# Patient Record
Sex: Female | Born: 1940 | Race: White | Hispanic: No | Smoking: Former smoker
Health system: Southern US, Community
[De-identification: ages and names within clinical notes are randomized; demographics above are authoritative.]

## PROBLEM LIST (undated history)

## (undated) DIAGNOSIS — E7800 Pure hypercholesterolemia, unspecified: Secondary | ICD-10-CM

## (undated) DIAGNOSIS — M810 Age-related osteoporosis without current pathological fracture: Secondary | ICD-10-CM

## (undated) DIAGNOSIS — C801 Malignant (primary) neoplasm, unspecified: Secondary | ICD-10-CM

## (undated) DIAGNOSIS — I10 Essential (primary) hypertension: Secondary | ICD-10-CM

## (undated) DIAGNOSIS — N3281 Overactive bladder: Secondary | ICD-10-CM

## (undated) DIAGNOSIS — E785 Hyperlipidemia, unspecified: Secondary | ICD-10-CM

## (undated) DIAGNOSIS — R9431 Abnormal electrocardiogram [ECG] [EKG]: Secondary | ICD-10-CM

## (undated) DIAGNOSIS — H269 Unspecified cataract: Secondary | ICD-10-CM

## (undated) HISTORY — PX: PORTACATH PLACEMENT: SHX2246

## (undated) HISTORY — DX: Hyperlipidemia, unspecified: E78.5

## (undated) HISTORY — PX: BREAST SURGERY: SHX581

## (undated) HISTORY — DX: Malignant (primary) neoplasm, unspecified: C80.1

## (undated) HISTORY — DX: Overactive bladder: N32.81

## (undated) HISTORY — PX: FRACTURE SURGERY: SHX138

## (undated) HISTORY — DX: Age-related osteoporosis without current pathological fracture: M81.0

## (undated) HISTORY — PX: JOINT REPLACEMENT: SHX530

## (undated) HISTORY — DX: Unspecified cataract: H26.9

## (undated) HISTORY — DX: Pure hypercholesterolemia, unspecified: E78.00

## (undated) HISTORY — DX: Abnormal electrocardiogram (ECG) (EKG): R94.31

## (undated) HISTORY — DX: Essential (primary) hypertension: I10

---

## 2000-10-06 ENCOUNTER — Encounter: Admission: RE | Admit: 2000-10-06 | Discharge: 2001-01-04 | Payer: Self-pay | Admitting: Family Medicine

## 2001-01-27 ENCOUNTER — Ambulatory Visit (HOSPITAL_COMMUNITY): Admission: RE | Admit: 2001-01-27 | Discharge: 2001-01-27 | Payer: Self-pay

## 2005-01-29 ENCOUNTER — Ambulatory Visit: Payer: Self-pay | Admitting: Internal Medicine

## 2005-02-24 ENCOUNTER — Ambulatory Visit: Payer: Self-pay | Admitting: Family Medicine

## 2005-03-10 ENCOUNTER — Ambulatory Visit: Payer: Self-pay | Admitting: Family Medicine

## 2005-06-24 ENCOUNTER — Emergency Department (HOSPITAL_COMMUNITY): Admission: EM | Admit: 2005-06-24 | Discharge: 2005-06-24 | Payer: Self-pay | Admitting: Emergency Medicine

## 2005-07-16 ENCOUNTER — Encounter: Admission: RE | Admit: 2005-07-16 | Discharge: 2005-10-14 | Payer: Self-pay | Admitting: Sports Medicine

## 2005-08-19 ENCOUNTER — Ambulatory Visit: Payer: Self-pay | Admitting: Family Medicine

## 2005-09-03 ENCOUNTER — Ambulatory Visit: Payer: Self-pay | Admitting: Family Medicine

## 2006-04-14 ENCOUNTER — Ambulatory Visit: Payer: Self-pay | Admitting: Family Medicine

## 2006-08-25 ENCOUNTER — Ambulatory Visit: Payer: Self-pay | Admitting: Family Medicine

## 2006-11-02 HISTORY — PX: CATARACT EXTRACTION: SUR2

## 2007-03-08 ENCOUNTER — Ambulatory Visit: Payer: Self-pay | Admitting: Family Medicine

## 2007-07-07 ENCOUNTER — Telehealth (INDEPENDENT_AMBULATORY_CARE_PROVIDER_SITE_OTHER): Payer: Self-pay | Admitting: Family Medicine

## 2007-08-16 ENCOUNTER — Telehealth (INDEPENDENT_AMBULATORY_CARE_PROVIDER_SITE_OTHER): Payer: Self-pay | Admitting: *Deleted

## 2007-08-24 ENCOUNTER — Telehealth (INDEPENDENT_AMBULATORY_CARE_PROVIDER_SITE_OTHER): Payer: Self-pay | Admitting: Family Medicine

## 2007-08-29 ENCOUNTER — Ambulatory Visit: Payer: Self-pay | Admitting: Nurse Practitioner

## 2007-08-29 LAB — CONVERTED CEMR LAB
ALT: 31 units/L (ref 0–35)
AST: 18 units/L (ref 0–37)
Basophils Absolute: 0 10*3/uL (ref 0.0–0.1)
Basophils Relative: 0 % (ref 0–1)
CO2: 22 meq/L (ref 19–32)
Cholesterol: 172 mg/dL (ref 0–200)
Creatinine, Ser: 0.95 mg/dL (ref 0.40–1.20)
Eosinophils Relative: 0 % (ref 0–5)
HCT: 43.8 % (ref 36.0–46.0)
Hemoglobin: 14.6 g/dL (ref 12.0–15.0)
MCHC: 33.3 g/dL (ref 30.0–36.0)
Monocytes Absolute: 0.6 10*3/uL (ref 0.2–0.7)
RDW: 13.5 % (ref 11.5–14.0)
Total Bilirubin: 0.5 mg/dL (ref 0.3–1.2)
Total CHOL/HDL Ratio: 4.8
VLDL: 45 mg/dL — ABNORMAL HIGH (ref 0–40)

## 2007-09-12 ENCOUNTER — Ambulatory Visit: Payer: Self-pay | Admitting: Internal Medicine

## 2007-10-11 ENCOUNTER — Ambulatory Visit: Payer: Self-pay | Admitting: Family Medicine

## 2007-10-18 ENCOUNTER — Ambulatory Visit: Payer: Self-pay | Admitting: Internal Medicine

## 2007-11-07 ENCOUNTER — Ambulatory Visit: Payer: Self-pay | Admitting: Internal Medicine

## 2007-11-24 ENCOUNTER — Ambulatory Visit: Payer: Self-pay | Admitting: Internal Medicine

## 2007-12-15 ENCOUNTER — Ambulatory Visit: Payer: Self-pay | Admitting: Family Medicine

## 2008-06-25 ENCOUNTER — Encounter (INDEPENDENT_AMBULATORY_CARE_PROVIDER_SITE_OTHER): Payer: Self-pay | Admitting: Family Medicine

## 2008-06-25 ENCOUNTER — Ambulatory Visit: Payer: Self-pay | Admitting: Internal Medicine

## 2008-06-25 LAB — CONVERTED CEMR LAB: Microalb, Ur: 2.61 mg/dL — ABNORMAL HIGH (ref 0.00–1.89)

## 2008-07-05 ENCOUNTER — Ambulatory Visit: Payer: Self-pay | Admitting: Internal Medicine

## 2009-04-02 ENCOUNTER — Ambulatory Visit: Payer: Self-pay | Admitting: Internal Medicine

## 2009-04-02 ENCOUNTER — Encounter (INDEPENDENT_AMBULATORY_CARE_PROVIDER_SITE_OTHER): Payer: Self-pay | Admitting: Internal Medicine

## 2009-04-02 LAB — CONVERTED CEMR LAB
ALT: 26 units/L (ref 0–35)
BUN: 26 mg/dL — ABNORMAL HIGH (ref 6–23)
CO2: 24 meq/L (ref 19–32)
Creatinine, Ser: 1.14 mg/dL (ref 0.40–1.20)
TSH: 2.123 microintl units/mL (ref 0.350–4.500)
Total Bilirubin: 0.3 mg/dL (ref 0.3–1.2)

## 2009-05-23 ENCOUNTER — Encounter (INDEPENDENT_AMBULATORY_CARE_PROVIDER_SITE_OTHER): Payer: Self-pay | Admitting: Internal Medicine

## 2009-05-23 ENCOUNTER — Ambulatory Visit: Payer: Self-pay | Admitting: Internal Medicine

## 2009-05-23 LAB — CONVERTED CEMR LAB
Cholesterol: 134 mg/dL (ref 0–200)
HDL: 43 mg/dL (ref >39)
Total CHOL/HDL Ratio: 3.1
Triglycerides: 166 mg/dL — ABNORMAL HIGH (ref <150)

## 2009-06-11 ENCOUNTER — Inpatient Hospital Stay (HOSPITAL_COMMUNITY): Admission: EM | Admit: 2009-06-11 | Discharge: 2009-06-21 | Payer: Self-pay

## 2009-06-18 ENCOUNTER — Ambulatory Visit: Payer: Self-pay | Admitting: Physical Medicine & Rehabilitation

## 2009-10-24 ENCOUNTER — Ambulatory Visit: Payer: Self-pay | Admitting: Family Medicine

## 2010-02-04 ENCOUNTER — Ambulatory Visit: Payer: Self-pay | Admitting: Family Medicine

## 2010-02-20 ENCOUNTER — Encounter (INDEPENDENT_AMBULATORY_CARE_PROVIDER_SITE_OTHER): Payer: Self-pay | Admitting: Adult Health

## 2010-02-20 ENCOUNTER — Ambulatory Visit: Payer: Self-pay | Admitting: Internal Medicine

## 2010-02-20 LAB — CONVERTED CEMR LAB
BUN: 21 mg/dL (ref 6–23)
CO2: 23 meq/L (ref 19–32)
Cholesterol: 129 mg/dL (ref 0–200)
Creatinine, Ser: 1.09 mg/dL (ref 0.40–1.20)
Glucose, Bld: 226 mg/dL — ABNORMAL HIGH (ref 70–99)
HDL: 41 mg/dL (ref >39)
Total Bilirubin: 0.4 mg/dL (ref 0.3–1.2)
Total CHOL/HDL Ratio: 3.1
Triglycerides: 145 mg/dL (ref <150)
VLDL: 29 mg/dL (ref 0–40)
Vit D, 25-Hydroxy: <10 ng/mL — ABNORMAL LOW (ref 30–89)

## 2010-03-28 ENCOUNTER — Ambulatory Visit (HOSPITAL_COMMUNITY): Admission: RE | Admit: 2010-03-28 | Discharge: 2010-03-28 | Payer: Self-pay | Admitting: Internal Medicine

## 2010-04-04 ENCOUNTER — Encounter: Admission: RE | Admit: 2010-04-04 | Discharge: 2010-04-04 | Payer: Self-pay | Admitting: Family Medicine

## 2010-04-11 ENCOUNTER — Encounter: Admission: RE | Admit: 2010-04-11 | Discharge: 2010-04-11 | Payer: Self-pay | Admitting: Internal Medicine

## 2010-04-14 ENCOUNTER — Ambulatory Visit: Payer: Self-pay | Admitting: Oncology

## 2010-04-22 ENCOUNTER — Ambulatory Visit: Admission: RE | Admit: 2010-04-22 | Discharge: 2010-07-14 | Payer: Self-pay | Admitting: Radiation Oncology

## 2010-04-22 ENCOUNTER — Ambulatory Visit (HOSPITAL_COMMUNITY): Admission: RE | Admit: 2010-04-22 | Discharge: 2010-04-22 | Payer: Self-pay | Admitting: General Surgery

## 2010-04-29 LAB — CBC WITH DIFFERENTIAL/PLATELET
BASO%: 0 % (ref 0.0–2.0)
EOS%: 0.7 % (ref 0.0–7.0)
MCH: 30.9 pg (ref 25.1–34.0)
MCHC: 32.2 g/dL (ref 31.5–36.0)
RBC: 3.85 10*6/uL (ref 3.70–5.45)
RDW: 13.7 % (ref 11.2–14.5)
WBC: 6.8 10*3/uL (ref 3.9–10.3)
lymph#: 1.9 10*3/uL (ref 0.9–3.3)

## 2010-04-29 LAB — COMPREHENSIVE METABOLIC PANEL
ALT: 15 U/L (ref 0–35)
AST: 12 U/L (ref 0–37)
Albumin: 4.1 g/dL (ref 3.5–5.2)
Calcium: 9.7 mg/dL (ref 8.4–10.5)
Chloride: 101 mEq/L (ref 96–112)
Creatinine, Ser: 1.3 mg/dL — ABNORMAL HIGH (ref 0.40–1.20)
Potassium: 4.9 mEq/L (ref 3.5–5.3)
Sodium: 138 mEq/L (ref 135–145)
Total Protein: 6.8 g/dL (ref 6.0–8.3)

## 2010-05-07 ENCOUNTER — Ambulatory Visit: Payer: Self-pay | Admitting: Adult Health

## 2010-05-15 ENCOUNTER — Encounter (INDEPENDENT_AMBULATORY_CARE_PROVIDER_SITE_OTHER): Payer: Self-pay | Admitting: General Surgery

## 2010-05-15 ENCOUNTER — Encounter: Admission: RE | Admit: 2010-05-15 | Discharge: 2010-05-15 | Payer: Self-pay | Admitting: General Surgery

## 2010-05-15 ENCOUNTER — Ambulatory Visit (HOSPITAL_COMMUNITY): Admission: RE | Admit: 2010-05-15 | Discharge: 2010-05-16 | Payer: Self-pay | Admitting: General Surgery

## 2010-05-19 ENCOUNTER — Ambulatory Visit (HOSPITAL_COMMUNITY): Admission: RE | Admit: 2010-05-19 | Discharge: 2010-05-19 | Payer: Self-pay | Admitting: Oncology

## 2010-06-09 ENCOUNTER — Ambulatory Visit: Payer: Self-pay | Admitting: Oncology

## 2010-06-11 LAB — CBC WITH DIFFERENTIAL/PLATELET
BASO%: 0.3 % (ref 0.0–2.0)
Basophils Absolute: 0 10*3/uL (ref 0.0–0.1)
EOS%: 0.9 % (ref 0.0–7.0)
HCT: 33.8 % — ABNORMAL LOW (ref 34.8–46.6)
HGB: 11.6 g/dL (ref 11.6–15.9)
MCH: 31.8 pg (ref 25.1–34.0)
MONO#: 0.6 10*3/uL (ref 0.1–0.9)
RDW: 12.9 % (ref 11.2–14.5)
WBC: 8.8 10*3/uL (ref 3.9–10.3)
lymph#: 2 10*3/uL (ref 0.9–3.3)

## 2010-06-11 LAB — COMPREHENSIVE METABOLIC PANEL
ALT: 14 U/L (ref 0–35)
AST: 11 U/L (ref 0–37)
Albumin: 4.1 g/dL (ref 3.5–5.2)
CO2: 25 mEq/L (ref 19–32)
Calcium: 9.8 mg/dL (ref 8.4–10.5)
Chloride: 101 mEq/L (ref 96–112)
Potassium: 4.3 mEq/L (ref 3.5–5.3)

## 2010-06-27 ENCOUNTER — Ambulatory Visit (HOSPITAL_COMMUNITY): Admission: RE | Admit: 2010-06-27 | Discharge: 2010-06-27 | Payer: Self-pay | Admitting: Oncology

## 2010-07-02 ENCOUNTER — Ambulatory Visit (HOSPITAL_COMMUNITY)
Admission: RE | Admit: 2010-07-02 | Discharge: 2010-07-03 | Payer: Self-pay | Source: Home / Self Care | Admitting: General Surgery

## 2010-07-02 ENCOUNTER — Encounter (INDEPENDENT_AMBULATORY_CARE_PROVIDER_SITE_OTHER): Payer: Self-pay | Admitting: General Surgery

## 2010-07-23 ENCOUNTER — Ambulatory Visit: Payer: Self-pay | Admitting: Oncology

## 2010-08-06 LAB — BASIC METABOLIC PANEL
Calcium: 9.7 mg/dL (ref 8.4–10.5)
Creatinine, Ser: 0.99 mg/dL (ref 0.40–1.20)
Glucose, Bld: 445 mg/dL — ABNORMAL HIGH (ref 70–99)
Sodium: 135 mEq/L (ref 135–145)

## 2010-08-06 LAB — CBC WITH DIFFERENTIAL/PLATELET
BASO%: 0 % (ref 0.0–2.0)
Eosinophils Absolute: 0.1 10*3/uL (ref 0.0–0.5)
HCT: 31.4 % — ABNORMAL LOW (ref 34.8–46.6)
LYMPH%: 42.6 % (ref 14.0–49.7)
MCHC: 35 g/dL (ref 31.5–36.0)
MCV: 91.6 fL (ref 79.5–101.0)
MONO%: 4.5 % (ref 0.0–14.0)
NEUT%: 51.7 % (ref 38.4–76.8)
Platelets: 170 10*3/uL (ref 145–400)
RBC: 3.42 10*6/uL — ABNORMAL LOW (ref 3.70–5.45)

## 2010-08-19 ENCOUNTER — Ambulatory Visit: Payer: Self-pay | Admitting: Oncology

## 2010-08-20 LAB — CBC WITH DIFFERENTIAL/PLATELET
BASO%: 0.4 % (ref 0.0–2.0)
EOS%: 0.8 % (ref 0.0–7.0)
HCT: 37.2 % (ref 34.8–46.6)
MCH: 31 pg (ref 25.1–34.0)
MCHC: 32.8 g/dL (ref 31.5–36.0)
MONO#: 0.7 10*3/uL (ref 0.1–0.9)
NEUT%: 37.3 % — ABNORMAL LOW (ref 38.4–76.8)
RDW: 14.6 % — ABNORMAL HIGH (ref 11.2–14.5)
WBC: 5.1 10*3/uL (ref 3.9–10.3)
lymph#: 2.4 10*3/uL (ref 0.9–3.3)

## 2010-08-20 LAB — COMPREHENSIVE METABOLIC PANEL
ALT: 20 U/L (ref 0–35)
AST: 18 U/L (ref 0–37)
Albumin: 4.1 g/dL (ref 3.5–5.2)
CO2: 23 mEq/L (ref 19–32)
Calcium: 9.7 mg/dL (ref 8.4–10.5)
Chloride: 102 mEq/L (ref 96–112)
Creatinine, Ser: 1.12 mg/dL (ref 0.40–1.20)
Potassium: 4.4 mEq/L (ref 3.5–5.3)
Sodium: 137 mEq/L (ref 135–145)
Total Protein: 6.6 g/dL (ref 6.0–8.3)

## 2010-08-27 LAB — CBC WITH DIFFERENTIAL/PLATELET
BASO%: 0.6 % (ref 0.0–2.0)
EOS%: 0.8 % (ref 0.0–7.0)
HCT: 34.4 % — ABNORMAL LOW (ref 34.8–46.6)
MCH: 30.8 pg (ref 25.1–34.0)
MCHC: 33.4 g/dL (ref 31.5–36.0)
MONO#: 0.1 10*3/uL (ref 0.1–0.9)
NEUT%: 52.5 % (ref 38.4–76.8)
RBC: 3.73 10*6/uL (ref 3.70–5.45)
RDW: 13.9 % (ref 11.2–14.5)
WBC: 3.6 10*3/uL — ABNORMAL LOW (ref 3.9–10.3)
lymph#: 1.6 10*3/uL (ref 0.9–3.3)

## 2010-09-10 LAB — CBC WITH DIFFERENTIAL/PLATELET
Basophils Absolute: 0 10*3/uL (ref 0.0–0.1)
EOS%: 1.4 % (ref 0.0–7.0)
Eosinophils Absolute: 0.1 10*3/uL (ref 0.0–0.5)
HCT: 35.8 % (ref 34.8–46.6)
HGB: 11.7 g/dL (ref 11.6–15.9)
MCH: 31 pg (ref 25.1–34.0)
MONO#: 0.8 10*3/uL (ref 0.1–0.9)
NEUT#: 2.7 10*3/uL (ref 1.5–6.5)
NEUT%: 47.9 % (ref 38.4–76.8)
lymph#: 2 10*3/uL (ref 0.9–3.3)

## 2010-09-10 LAB — COMPREHENSIVE METABOLIC PANEL
AST: 16 U/L (ref 0–37)
BUN: 18 mg/dL (ref 6–23)
Calcium: 9.8 mg/dL (ref 8.4–10.5)
Chloride: 106 mEq/L (ref 96–112)
Creatinine, Ser: 0.97 mg/dL (ref 0.40–1.20)
Glucose, Bld: 219 mg/dL — ABNORMAL HIGH (ref 70–99)

## 2010-09-17 LAB — CBC WITH DIFFERENTIAL/PLATELET
Basophils Absolute: 0 10*3/uL (ref 0.0–0.1)
Eosinophils Absolute: 0 10*3/uL (ref 0.0–0.5)
HCT: 32.9 % — ABNORMAL LOW (ref 34.8–46.6)
HGB: 10.7 g/dL — ABNORMAL LOW (ref 11.6–15.9)
LYMPH%: 41.8 % (ref 14.0–49.7)
MCV: 93.5 fL (ref 79.5–101.0)
MONO#: 0.1 10*3/uL (ref 0.1–0.9)
MONO%: 3.9 % (ref 0.0–14.0)
NEUT#: 1.8 10*3/uL (ref 1.5–6.5)
Platelets: 157 10*3/uL (ref 145–400)
WBC: 3.4 10*3/uL — ABNORMAL LOW (ref 3.9–10.3)

## 2010-09-29 ENCOUNTER — Ambulatory Visit: Payer: Self-pay | Admitting: Oncology

## 2010-10-01 LAB — COMPREHENSIVE METABOLIC PANEL
Albumin: 4 g/dL (ref 3.5–5.2)
Alkaline Phosphatase: 82 U/L (ref 39–117)
BUN: 16 mg/dL (ref 6–23)
CO2: 21 mEq/L (ref 19–32)
Calcium: 9.6 mg/dL (ref 8.4–10.5)
Glucose, Bld: 334 mg/dL — ABNORMAL HIGH (ref 70–99)
Potassium: 4.3 mEq/L (ref 3.5–5.3)

## 2010-10-01 LAB — CBC WITH DIFFERENTIAL/PLATELET
Basophils Absolute: 0 10*3/uL (ref 0.0–0.1)
Eosinophils Absolute: 0 10*3/uL (ref 0.0–0.5)
HGB: 11.8 g/dL (ref 11.6–15.9)
MCV: 92.8 fL (ref 79.5–101.0)
MONO#: 0.7 10*3/uL (ref 0.1–0.9)
NEUT#: 3.2 10*3/uL (ref 1.5–6.5)
RDW: 14.7 % — ABNORMAL HIGH (ref 11.2–14.5)
WBC: 5.7 10*3/uL (ref 3.9–10.3)
lymph#: 1.7 10*3/uL (ref 0.9–3.3)

## 2010-10-08 LAB — CBC WITH DIFFERENTIAL/PLATELET
Basophils Absolute: 0 10*3/uL (ref 0.0–0.1)
EOS%: 0.5 % (ref 0.0–7.0)
HGB: 11.7 g/dL (ref 11.6–15.9)
MCH: 31 pg (ref 25.1–34.0)
MCV: 91.3 fL (ref 79.5–101.0)
MONO%: 3.2 % (ref 0.0–14.0)
RDW: 13.6 % (ref 11.2–14.5)

## 2010-10-22 LAB — COMPREHENSIVE METABOLIC PANEL
ALT: 15 U/L (ref 0–35)
AST: 15 U/L (ref 0–37)
Creatinine, Ser: 1.35 mg/dL — ABNORMAL HIGH (ref 0.40–1.20)
Total Bilirubin: 0.4 mg/dL (ref 0.3–1.2)

## 2010-10-22 LAB — CBC WITH DIFFERENTIAL/PLATELET
EOS%: 0.6 % (ref 0.0–7.0)
Eosinophils Absolute: 0.1 10*3/uL (ref 0.0–0.5)
MCH: 31.3 pg (ref 25.1–34.0)
MCV: 92.9 fL (ref 79.5–101.0)
MONO%: 9.6 % (ref 0.0–14.0)
NEUT#: 5.5 10*3/uL (ref 1.5–6.5)
RBC: 3.93 10*6/uL (ref 3.70–5.45)
RDW: 14 % (ref 11.2–14.5)
nRBC: 0 % (ref 0–0)

## 2010-10-22 LAB — TECHNOLOGIST REVIEW

## 2010-10-29 ENCOUNTER — Ambulatory Visit: Payer: Self-pay | Admitting: Oncology

## 2010-10-29 LAB — CBC WITH DIFFERENTIAL/PLATELET
BASO%: 0.2 % (ref 0.0–2.0)
EOS%: 0.2 % (ref 0.0–7.0)
HCT: 32.9 % — ABNORMAL LOW (ref 34.8–46.6)
LYMPH%: 30.9 % (ref 14.0–49.7)
MCH: 30.4 pg (ref 25.1–34.0)
MCHC: 33.4 g/dL (ref 31.5–36.0)
MONO#: 0.1 10*3/uL (ref 0.1–0.9)
NEUT%: 66.7 % (ref 38.4–76.8)
Platelets: 135 10*3/uL — ABNORMAL LOW (ref 145–400)

## 2010-10-30 ENCOUNTER — Ambulatory Visit: Payer: Self-pay | Admitting: Thoracic Surgery

## 2010-11-12 LAB — CBC WITH DIFFERENTIAL/PLATELET
BASO%: 0.1 % (ref 0.0–2.0)
Basophils Absolute: 0 10*3/uL (ref 0.0–0.1)
EOS%: 0.1 % (ref 0.0–7.0)
Eosinophils Absolute: 0 10*3/uL (ref 0.0–0.5)
HCT: 35.4 % (ref 34.8–46.6)
HGB: 11.9 g/dL (ref 11.6–15.9)
LYMPH%: 14.4 % (ref 14.0–49.7)
MCH: 30.9 pg (ref 25.1–34.0)
MCHC: 33.6 g/dL (ref 31.5–36.0)
MCV: 91.9 fL (ref 79.5–101.0)
MONO#: 0.8 10*3/uL (ref 0.1–0.9)
MONO%: 7.5 % (ref 0.0–14.0)
NEUT#: 8.7 10*3/uL — ABNORMAL HIGH (ref 1.5–6.5)
NEUT%: 77.9 % — ABNORMAL HIGH (ref 38.4–76.8)
Platelets: 316 10*3/uL (ref 145–400)
RBC: 3.85 10*6/uL (ref 3.70–5.45)
RDW: 14.1 % (ref 11.2–14.5)
WBC: 11.1 10*3/uL — ABNORMAL HIGH (ref 3.9–10.3)
lymph#: 1.6 10*3/uL (ref 0.9–3.3)

## 2010-11-12 LAB — COMPREHENSIVE METABOLIC PANEL
ALT: 9 U/L (ref 0–35)
AST: 9 U/L (ref 0–37)
Albumin: 4 g/dL (ref 3.5–5.2)
Alkaline Phosphatase: 79 U/L (ref 39–117)
BUN: 22 mg/dL (ref 6–23)
CO2: 20 mEq/L (ref 19–32)
Calcium: 9.3 mg/dL (ref 8.4–10.5)
Chloride: 103 mEq/L (ref 96–112)
Creatinine, Ser: 0.91 mg/dL (ref 0.40–1.20)
Glucose, Bld: 378 mg/dL — ABNORMAL HIGH (ref 70–99)
Potassium: 4.1 mEq/L (ref 3.5–5.3)
Sodium: 139 mEq/L (ref 135–145)
Total Bilirubin: 0.4 mg/dL (ref 0.3–1.2)
Total Protein: 6.8 g/dL (ref 6.0–8.3)

## 2010-11-12 LAB — TECHNOLOGIST REVIEW

## 2010-11-18 LAB — CBC WITH DIFFERENTIAL/PLATELET
BASO%: 0.2 % (ref 0.0–2.0)
Basophils Absolute: 0 10*3/uL (ref 0.0–0.1)
EOS%: 0 % (ref 0.0–7.0)
Eosinophils Absolute: 0 10*3/uL (ref 0.0–0.5)
HCT: 31.7 % — ABNORMAL LOW (ref 34.8–46.6)
HGB: 10.8 g/dL — ABNORMAL LOW (ref 11.6–15.9)
LYMPH%: 19.4 % (ref 14.0–49.7)
MCH: 30.6 pg (ref 25.1–34.0)
MCHC: 34.1 g/dL (ref 31.5–36.0)
MCV: 89.8 fL (ref 79.5–101.0)
MONO#: 0.1 10*3/uL (ref 0.1–0.9)
MONO%: 3.1 % (ref 0.0–14.0)
NEUT#: 3.2 10*3/uL (ref 1.5–6.5)
NEUT%: 77.3 % — ABNORMAL HIGH (ref 38.4–76.8)
Platelets: 132 10*3/uL — ABNORMAL LOW (ref 145–400)
RBC: 3.53 10*6/uL — ABNORMAL LOW (ref 3.70–5.45)
RDW: 13.7 % (ref 11.2–14.5)
WBC: 4.2 10*3/uL (ref 3.9–10.3)
lymph#: 0.8 10*3/uL — ABNORMAL LOW (ref 0.9–3.3)
nRBC: 0 % (ref 0–0)

## 2010-11-23 ENCOUNTER — Encounter: Payer: Self-pay | Admitting: Occupational Therapy

## 2010-11-23 ENCOUNTER — Encounter: Payer: Self-pay | Admitting: General Surgery

## 2010-11-25 ENCOUNTER — Ambulatory Visit
Admission: RE | Admit: 2010-11-25 | Discharge: 2010-12-02 | Payer: Self-pay | Source: Home / Self Care | Attending: Radiation Oncology | Admitting: Radiation Oncology

## 2010-12-03 ENCOUNTER — Ambulatory Visit: Payer: Medicare Other | Attending: Radiation Oncology | Admitting: Radiation Oncology

## 2010-12-03 DIAGNOSIS — C50919 Malignant neoplasm of unspecified site of unspecified female breast: Secondary | ICD-10-CM | POA: Insufficient documentation

## 2010-12-03 DIAGNOSIS — Z510 Encounter for antineoplastic radiation therapy: Secondary | ICD-10-CM | POA: Insufficient documentation

## 2010-12-03 DIAGNOSIS — L299 Pruritus, unspecified: Secondary | ICD-10-CM | POA: Insufficient documentation

## 2010-12-09 ENCOUNTER — Encounter (HOSPITAL_BASED_OUTPATIENT_CLINIC_OR_DEPARTMENT_OTHER): Payer: Medicare Other | Admitting: Oncology

## 2010-12-09 DIAGNOSIS — C50419 Malignant neoplasm of upper-outer quadrant of unspecified female breast: Secondary | ICD-10-CM

## 2010-12-09 DIAGNOSIS — Z452 Encounter for adjustment and management of vascular access device: Secondary | ICD-10-CM

## 2011-01-15 LAB — GLUCOSE, CAPILLARY: Glucose-Capillary: 192 mg/dL — ABNORMAL HIGH (ref 70–99)

## 2011-01-16 LAB — GLUCOSE, CAPILLARY
Glucose-Capillary: 233 mg/dL — ABNORMAL HIGH (ref 70–99)
Glucose-Capillary: 234 mg/dL — ABNORMAL HIGH (ref 70–99)
Glucose-Capillary: 243 mg/dL — ABNORMAL HIGH (ref 70–99)
Glucose-Capillary: 270 mg/dL — ABNORMAL HIGH (ref 70–99)

## 2011-01-16 LAB — DIFFERENTIAL
Eosinophils Absolute: 0 10*3/uL (ref 0.0–0.7)
Eosinophils Relative: 1 % (ref 0–5)
Lymphocytes Relative: 34 % (ref 12–46)
Lymphs Abs: 2.5 10*3/uL (ref 0.7–4.0)
Monocytes Relative: 7 % (ref 3–12)

## 2011-01-16 LAB — SURGICAL PCR SCREEN
MRSA, PCR: NEGATIVE
Staphylococcus aureus: NEGATIVE

## 2011-01-16 LAB — CBC
Hemoglobin: 11.5 g/dL — ABNORMAL LOW (ref 12.0–15.0)
MCH: 30.4 pg (ref 26.0–34.0)
MCV: 94.7 fL (ref 78.0–100.0)
RBC: 3.78 MIL/uL — ABNORMAL LOW (ref 3.87–5.11)
WBC: 7.3 10*3/uL (ref 4.0–10.5)

## 2011-01-16 LAB — BASIC METABOLIC PANEL
CO2: 28 mEq/L (ref 19–32)
Chloride: 104 mEq/L (ref 96–112)
GFR calc Af Amer: 52 mL/min — ABNORMAL LOW (ref >60)
Potassium: 5.1 mEq/L (ref 3.5–5.1)
Sodium: 139 mEq/L (ref 135–145)

## 2011-01-17 LAB — GLUCOSE, CAPILLARY: Glucose-Capillary: 288 mg/dL — ABNORMAL HIGH (ref 70–99)

## 2011-01-18 LAB — COMPREHENSIVE METABOLIC PANEL
ALT: 18 U/L (ref 0–35)
AST: 22 U/L (ref 0–37)
Albumin: 3.9 g/dL (ref 3.5–5.2)
Alkaline Phosphatase: 76 U/L (ref 39–117)
BUN: 19 mg/dL (ref 6–23)
CO2: 28 mEq/L (ref 19–32)
Chloride: 103 mEq/L (ref 96–112)
Creatinine, Ser: 1.17 mg/dL (ref 0.4–1.2)
GFR calc Af Amer: 55 mL/min — ABNORMAL LOW (ref >60)
Glucose, Bld: 454 mg/dL — ABNORMAL HIGH (ref 70–99)
Potassium: 4.4 mEq/L (ref 3.5–5.1)
Potassium: 4.1 mEq/L (ref 3.5–5.1)
Sodium: 138 mEq/L (ref 135–145)
Total Protein: 7.1 g/dL (ref 6.0–8.3)
Total Protein: 6.9 g/dL (ref 6.0–8.3)

## 2011-01-18 LAB — DIFFERENTIAL
Basophils Relative: 0 % (ref 0–1)
Eosinophils Absolute: 0 10*3/uL (ref 0.0–0.7)
Eosinophils Relative: 1 % (ref 0–5)
Lymphocytes Relative: 28 % (ref 12–46)
Monocytes Absolute: 0.5 10*3/uL (ref 0.1–1.0)
Monocytes Relative: 7 % (ref 3–12)
Monocytes Relative: 7 % (ref 3–12)
Neutro Abs: 5 10*3/uL (ref 1.7–7.7)
Neutrophils Relative %: 64 % (ref 43–77)

## 2011-01-18 LAB — CBC
Hemoglobin: 11.6 g/dL — ABNORMAL LOW (ref 12.0–15.0)
MCH: 32.3 pg (ref 26.0–34.0)
MCV: 94.3 fL (ref 78.0–100.0)
Platelets: 185 10*3/uL (ref 150–400)
RBC: 3.81 MIL/uL — ABNORMAL LOW (ref 3.87–5.11)
RBC: 3.6 MIL/uL — ABNORMAL LOW (ref 3.87–5.11)
RDW: 13.8 % (ref 11.5–15.5)
RDW: 14.2 % (ref 11.5–15.5)
WBC: 7.8 10*3/uL (ref 4.0–10.5)
WBC: 7.4 10*3/uL (ref 4.0–10.5)

## 2011-01-18 LAB — GLUCOSE, CAPILLARY
Glucose-Capillary: 313 mg/dL — ABNORMAL HIGH (ref 70–99)
Glucose-Capillary: 315 mg/dL — ABNORMAL HIGH (ref 70–99)

## 2011-01-18 LAB — CANCER ANTIGEN 27.29
CA 27.29: 30 U/mL (ref 0–39)
CA 27.29: 23 U/mL (ref 0–39)

## 2011-02-07 LAB — CBC
HCT: 23.3 % — ABNORMAL LOW (ref 36.0–46.0)
HCT: 26.7 % — ABNORMAL LOW (ref 36.0–46.0)
HCT: 40 % (ref 36.0–46.0)
Hemoglobin: 9.4 g/dL — ABNORMAL LOW (ref 12.0–15.0)
Hemoglobin: 10.5 g/dL — ABNORMAL LOW (ref 12.0–15.0)
Hemoglobin: 13.7 g/dL (ref 12.0–15.0)
Hemoglobin: 8 g/dL — ABNORMAL LOW (ref 12.0–15.0)
Hemoglobin: 8.7 g/dL — ABNORMAL LOW (ref 12.0–15.0)
Hemoglobin: 9.1 g/dL — ABNORMAL LOW (ref 12.0–15.0)
MCHC: 34 g/dL (ref 30.0–36.0)
MCHC: 34.1 g/dL (ref 30.0–36.0)
MCHC: 34.4 g/dL (ref 30.0–36.0)
MCHC: 34.2 g/dL (ref 30.0–36.0)
MCHC: 35 g/dL (ref 30.0–36.0)
MCV: 93.8 fL (ref 78.0–100.0)
MCV: 93 fL (ref 78.0–100.0)
MCV: 93.1 fL (ref 78.0–100.0)
MCV: 94.3 fL (ref 78.0–100.0)
Platelets: 203 10*3/uL (ref 150–400)
Platelets: 145 10*3/uL — ABNORMAL LOW (ref 150–400)
Platelets: 210 10*3/uL (ref 150–400)
RBC: 2.88 MIL/uL — ABNORMAL LOW (ref 3.87–5.11)
RBC: 2.87 MIL/uL — ABNORMAL LOW (ref 3.87–5.11)
RBC: 3.29 MIL/uL — ABNORMAL LOW (ref 3.87–5.11)
RDW: 13.4 % (ref 11.5–15.5)
RDW: 13.5 % (ref 11.5–15.5)
RDW: 13.6 % (ref 11.5–15.5)
RDW: 13.8 % (ref 11.5–15.5)
WBC: 7.1 10*3/uL (ref 4.0–10.5)
WBC: 8.9 10*3/uL (ref 4.0–10.5)
WBC: 12.1 10*3/uL — ABNORMAL HIGH (ref 4.0–10.5)
WBC: 9.6 10*3/uL (ref 4.0–10.5)

## 2011-02-07 LAB — BASIC METABOLIC PANEL
BUN: 15 mg/dL (ref 6–23)
BUN: 16 mg/dL (ref 6–23)
CO2: 28 mEq/L (ref 19–32)
CO2: 30 mEq/L (ref 19–32)
CO2: 23 meq/L (ref 19–32)
CO2: 24 mEq/L (ref 19–32)
CO2: 26 mEq/L (ref 19–32)
CO2: 30 mEq/L (ref 19–32)
Calcium: 8.2 mg/dL — ABNORMAL LOW (ref 8.4–10.5)
Calcium: 8.5 mg/dL (ref 8.4–10.5)
Calcium: 7.8 mg/dL — ABNORMAL LOW (ref 8.4–10.5)
Calcium: 7.8 mg/dL — ABNORMAL LOW (ref 8.4–10.5)
Chloride: 106 meq/L (ref 96–112)
Chloride: 107 mEq/L (ref 96–112)
Chloride: 110 mEq/L (ref 96–112)
Creatinine, Ser: 0.62 mg/dL (ref 0.4–1.2)
Creatinine, Ser: 0.81 mg/dL (ref 0.4–1.2)
Creatinine, Ser: 0.82 mg/dL (ref 0.4–1.2)
GFR calc Af Amer: >60 mL/min (ref >60)
GFR calc non Af Amer: 56 mL/min — ABNORMAL LOW (ref >60)
GFR calc non Af Amer: >60 mL/min (ref >60)
GFR calc non Af Amer: >60 mL/min (ref >60)
Glucose, Bld: 184 mg/dL — ABNORMAL HIGH (ref 70–99)
Glucose, Bld: 82 mg/dL (ref 70–99)
Glucose, Bld: 159 mg/dL — ABNORMAL HIGH (ref 70–99)
Glucose, Bld: 187 mg/dL — ABNORMAL HIGH (ref 70–99)
Glucose, Bld: 211 mg/dL — ABNORMAL HIGH (ref 70–99)
Glucose, Bld: 343 mg/dL — ABNORMAL HIGH (ref 70–99)
Potassium: 3.4 mEq/L — ABNORMAL LOW (ref 3.5–5.1)
Potassium: 3.8 mEq/L (ref 3.5–5.1)
Potassium: 4.2 meq/L (ref 3.5–5.1)
Sodium: 143 mEq/L (ref 135–145)
Sodium: 139 meq/L (ref 135–145)
Sodium: 141 mEq/L (ref 135–145)
Sodium: 143 mEq/L (ref 135–145)

## 2011-02-07 LAB — POCT I-STAT 3, ART BLOOD GAS (G3+)
Bicarbonate: 24.4 meq/L — ABNORMAL HIGH (ref 20.0–24.0)
O2 Saturation: 96 %
O2 Saturation: 97 %
Patient temperature: 100.3
TCO2: 26 mmol/L (ref 0–100)
TCO2: 26 mmol/L (ref 0–100)

## 2011-02-07 LAB — GLUCOSE, CAPILLARY
Glucose-Capillary: 105 mg/dL — ABNORMAL HIGH (ref 70–99)
Glucose-Capillary: 116 mg/dL — ABNORMAL HIGH (ref 70–99)
Glucose-Capillary: 133 mg/dL — ABNORMAL HIGH (ref 70–99)
Glucose-Capillary: 133 mg/dL — ABNORMAL HIGH (ref 70–99)
Glucose-Capillary: 135 mg/dL — ABNORMAL HIGH (ref 70–99)
Glucose-Capillary: 141 mg/dL — ABNORMAL HIGH (ref 70–99)
Glucose-Capillary: 146 mg/dL — ABNORMAL HIGH (ref 70–99)
Glucose-Capillary: 179 mg/dL — ABNORMAL HIGH (ref 70–99)
Glucose-Capillary: 180 mg/dL — ABNORMAL HIGH (ref 70–99)
Glucose-Capillary: 219 mg/dL — ABNORMAL HIGH (ref 70–99)
Glucose-Capillary: 60 mg/dL — ABNORMAL LOW (ref 70–99)
Glucose-Capillary: 100 mg/dL — ABNORMAL HIGH (ref 70–99)
Glucose-Capillary: 118 mg/dL — ABNORMAL HIGH (ref 70–99)
Glucose-Capillary: 120 mg/dL — ABNORMAL HIGH (ref 70–99)
Glucose-Capillary: 125 mg/dL — ABNORMAL HIGH (ref 70–99)
Glucose-Capillary: 141 mg/dL — ABNORMAL HIGH (ref 70–99)
Glucose-Capillary: 155 mg/dL — ABNORMAL HIGH (ref 70–99)
Glucose-Capillary: 159 mg/dL — ABNORMAL HIGH (ref 70–99)
Glucose-Capillary: 165 mg/dL — ABNORMAL HIGH (ref 70–99)
Glucose-Capillary: 166 mg/dL — ABNORMAL HIGH (ref 70–99)
Glucose-Capillary: 214 mg/dL — ABNORMAL HIGH (ref 70–99)
Glucose-Capillary: 269 mg/dL — ABNORMAL HIGH (ref 70–99)

## 2011-02-07 LAB — POCT I-STAT 4, (NA,K, GLUC, HGB,HCT)
Hemoglobin: 10.2 g/dL — ABNORMAL LOW (ref 12.0–15.0)
Potassium: 3.4 meq/L — ABNORMAL LOW (ref 3.5–5.1)
Sodium: 142 meq/L (ref 135–145)

## 2011-02-07 LAB — DIFFERENTIAL
Basophils Absolute: 0 10*3/uL (ref 0.0–0.1)
Basophils Relative: 1 % (ref 0–1)
Eosinophils Absolute: 0.1 10*3/uL (ref 0.0–0.7)
Eosinophils Absolute: 0 10*3/uL (ref 0.0–0.7)
Eosinophils Relative: 0 % (ref 0–5)
Lymphs Abs: 1.2 10*3/uL (ref 0.7–4.0)
Monocytes Absolute: 0.8 10*3/uL (ref 0.1–1.0)
Monocytes Relative: 9 % (ref 3–12)
Neutrophils Relative %: 69 % (ref 43–77)

## 2011-02-07 LAB — POCT I-STAT GLUCOSE
Glucose, Bld: 266 mg/dL — ABNORMAL HIGH (ref 70–99)
Operator id: 249321

## 2011-02-07 LAB — FOLATE: Folate: 11.9 ng/mL

## 2011-02-07 LAB — URINE MICROSCOPIC-ADD ON

## 2011-02-07 LAB — POCT I-STAT, CHEM 8
Calcium, Ion: 1.08 mmol/L — ABNORMAL LOW (ref 1.12–1.32)
Glucose, Bld: 324 mg/dL — ABNORMAL HIGH (ref 70–99)
HCT: 43 % (ref 36.0–46.0)
TCO2: 22 mmol/L (ref 0–100)

## 2011-02-07 LAB — COMPREHENSIVE METABOLIC PANEL
Albumin: 2.8 g/dL — ABNORMAL LOW (ref 3.5–5.2)
CO2: 24 mEq/L (ref 19–32)
Chloride: 108 mEq/L (ref 96–112)
GFR calc Af Amer: >60 mL/min (ref >60)
Potassium: 3.7 mEq/L (ref 3.5–5.1)
Sodium: 138 mEq/L (ref 135–145)

## 2011-02-07 LAB — URINE CULTURE: Culture: NO GROWTH

## 2011-02-07 LAB — RETICULOCYTES
RBC.: 2.96 MIL/uL — ABNORMAL LOW (ref 3.87–5.11)
RBC.: 2.75 MIL/uL — ABNORMAL LOW (ref 3.87–5.11)
Retic Count, Absolute: 41.3 10*3/uL (ref 19.0–186.0)
Retic Ct Pct: 1.5 % (ref 0.4–3.1)

## 2011-02-07 LAB — URINALYSIS, ROUTINE W REFLEX MICROSCOPIC
Bilirubin Urine: NEGATIVE
Hgb urine dipstick: NEGATIVE
Nitrite: NEGATIVE
Specific Gravity, Urine: 1.028 (ref 1.005–1.030)
Urobilinogen, UA: 1 mg/dL (ref 0.0–1.0)
pH: 6.5 (ref 5.0–8.0)

## 2011-02-07 LAB — POCT I-STAT 7, (LYTES, BLD GAS, ICA,H+H)
Acid-base deficit: 1 mmol/L (ref 0.0–2.0)
Bicarbonate: 24.6 meq/L — ABNORMAL HIGH (ref 20.0–24.0)
Potassium: 3.8 meq/L (ref 3.5–5.1)
Sodium: 139 meq/L (ref 135–145)
TCO2: 26 mmol/L (ref 0–100)
pH, Arterial: 7.355 (ref 7.350–7.400)

## 2011-02-07 LAB — IRON AND TIBC
Iron: <10 ug/dL — ABNORMAL LOW (ref 42–135)
UIBC: 197 ug/dL

## 2011-02-07 LAB — APTT: aPTT: 25 seconds (ref 24–37)

## 2011-02-07 LAB — TRIGLYCERIDES
Triglycerides: 185 mg/dL — ABNORMAL HIGH (ref <150)
Triglycerides: 47 mg/dL (ref <150)

## 2011-02-07 LAB — PROTIME-INR: Prothrombin Time: 13.4 seconds (ref 11.6–15.2)

## 2011-02-07 LAB — FERRITIN: Ferritin: 445 ng/mL — ABNORMAL HIGH (ref 10–291)

## 2011-02-07 LAB — CHOLESTEROL, TOTAL: Cholesterol: 95 mg/dL (ref 0–200)

## 2011-02-12 ENCOUNTER — Other Ambulatory Visit: Payer: Self-pay | Admitting: Oncology

## 2011-02-12 ENCOUNTER — Encounter (HOSPITAL_BASED_OUTPATIENT_CLINIC_OR_DEPARTMENT_OTHER): Payer: Medicare Other | Admitting: Oncology

## 2011-02-12 DIAGNOSIS — T451X5A Adverse effect of antineoplastic and immunosuppressive drugs, initial encounter: Secondary | ICD-10-CM

## 2011-02-12 DIAGNOSIS — C50419 Malignant neoplasm of upper-outer quadrant of unspecified female breast: Secondary | ICD-10-CM

## 2011-02-12 DIAGNOSIS — C50919 Malignant neoplasm of unspecified site of unspecified female breast: Secondary | ICD-10-CM

## 2011-02-12 DIAGNOSIS — L270 Generalized skin eruption due to drugs and medicaments taken internally: Secondary | ICD-10-CM

## 2011-02-12 DIAGNOSIS — E119 Type 2 diabetes mellitus without complications: Secondary | ICD-10-CM

## 2011-02-12 DIAGNOSIS — Z5111 Encounter for antineoplastic chemotherapy: Secondary | ICD-10-CM

## 2011-02-12 LAB — CBC WITH DIFFERENTIAL/PLATELET
BASO%: 0.2 % (ref 0.0–2.0)
Eosinophils Absolute: 0.1 10*3/uL (ref 0.0–0.5)
HCT: 35.3 % (ref 34.8–46.6)
MCHC: 33.7 g/dL (ref 31.5–36.0)
MONO#: 0.5 10*3/uL (ref 0.1–0.9)
NEUT#: 4.8 10*3/uL (ref 1.5–6.5)
RBC: 4.01 10*6/uL (ref 3.70–5.45)
WBC: 6.5 10*3/uL (ref 3.9–10.3)
lymph#: 1.1 10*3/uL (ref 0.9–3.3)
nRBC: 0 % (ref 0–0)

## 2011-02-13 LAB — COMPREHENSIVE METABOLIC PANEL
ALT: 16 U/L (ref 0–35)
AST: 15 U/L (ref 0–37)
Albumin: 4.1 g/dL (ref 3.5–5.2)
CO2: 24 mEq/L (ref 19–32)
Calcium: 9.8 mg/dL (ref 8.4–10.5)
Chloride: 102 mEq/L (ref 96–112)
Potassium: 4.5 mEq/L (ref 3.5–5.3)
Total Protein: 6.8 g/dL (ref 6.0–8.3)

## 2011-02-27 ENCOUNTER — Ambulatory Visit: Payer: Medicare Other | Attending: Radiation Oncology | Admitting: Radiation Oncology

## 2011-02-27 DIAGNOSIS — L538 Other specified erythematous conditions: Secondary | ICD-10-CM | POA: Insufficient documentation

## 2011-02-27 DIAGNOSIS — C50419 Malignant neoplasm of upper-outer quadrant of unspecified female breast: Secondary | ICD-10-CM | POA: Insufficient documentation

## 2011-03-17 NOTE — Consult Note (Signed)
Christy [name], Christy [name]              ACCOUNT NO.:  [id number]   MEDICAL RECORD NO.:  [id number]          PATIENT TYPE:  INP   LOCATION:  2305                         FACILITY:  [facility]   PHYSICIAN:  [name]. [name], M.D.  DATE OF BIRTH:  [birth year]   DATE OF CONSULTATION:  06/11/2009  DATE OF DISCHARGE:                                 CONSULTATION   CHIEF COMPLAINT:  1. Right distal radius ulnar fracture, comminuted.  2. Right displaced femoral neck fracture.   SECONDARY DIAGNOSES:  1. Insulin-dependent diabetes.  2. Hypertension.  3. Hypercholesterolemia.  4. Pain medicine issues.   BRIEF HISTORY:  Christy [name] is a 69 year old female, who unfortunately  had a fall from a 4- to 5-feet distance.  She landed parallel on her  right side.  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  She predominantly uses left upper  extremity anyway.  She fell to the right sustaining right wrist and  right hip injury.  She was brought to the emergency room by EMS.  She  was seen, evaluated, and admitted to the Trauma Service as a trauma  patient.   Orthopedics was consulted.  Dr. Dominica Severin was consulted for the  issues regarding the right wrist and I for the hip.   Christy [name]'s daughter actually is a Nurse, learning disability as Christy [name] is a  [place] and does not speak in [place].  I had a chance to review the  history in addition to treating plans with her daughter.   PAST MEDICAL HISTORY:  1. Obesity.  2. Insulin-dependent diabetes.  3. Hypertension.  4. Hypercholesterolemia.   PAST SURGICAL HISTORY:  Negative.   CURRENT MEDICATIONS:  Includes Actos, lisinopril, Crestor, insulin,  Tricor, and tramadol for pain.   SOCIAL HISTORY:  She smoked up until about 3 months ago.  She lives  upstairs in her room with her son.  She walks independently and does not  drive.   DRUG ALLERGIES:  None.   REVIEW OF SYSTEMS:  Otherwise unremarkable other  than that noted in HPI  with no recent upper respiratory, pulmonary, cardiac, gastrointestinal,  genitourinary issues to report recently.   PHYSICAL EXAMINATION:  She was seen and evaluated in the emergency room.  She was sedated on pain medicines.  Again, her daughter was very  valuable in translation, but also reporting history and discussing  treatment plan to obtain consent.  She was otherwise arousable and  alert.  The general medical exam was deferred to the Trauma Admitting  Team.   Orthopedic examination:  Right upper extremity exam deferred to Dr.  Dominica Severin.  As for the right lower extremity, she had some  shortening in external rotation of the right lower extremity.  She held  her left leg in a flexed position for comfort.  She had pain with any  movement.  She otherwise is neurovascularly stable, nor there is any  significant venous or vascular compromise.   Radiographs that were ordered through the ER indicated a malunion of the  right proximal humerus.  She had  an acute fracture of the right distal  clavicle that was nondisplaced.  She had an acute right distal radial  and ulnar fracture as well as a displaced right femoral neck fracture.  She had some rib fractures as well.   ASSESSMENT:  Multiple injuries sustained from a fall noted as above by  radiographs.   PLAN:  The patient is going to be taken to the operating room this  evening with Dr. Dominica Severin and myself to fix the right wrist in  addition to fixing the right hip.  She will undergo hip hemiarthroplasty  of the right hip.  The risks and benefits of this procedure were  discussed with the daughter.  Consent was obtained.  Consent was  obtained separately from Dr. Dominica Severin for the wrist.  At this  point, the right distal clavicle will be treated in nonoperative  fashion.  She will be admitted to the Trauma Service.  DVT prophylaxis  and early mobility to prevent pneumonia will be critical as  well as from  a DVT standpoint.  Postoperatively, she most likely will require a  nursing facility as a home care will be minimal due to the fact that her  son and daughter work extensively, and chances are her mobility will be  limited.  Questions were encouraged and is reviewed with the daughter.      [name] [name], M.D.  Electronically Signed     MDO/MEDQ  D:  06/11/2009  T:  06/12/2009  Job:  [id number]

## 2011-03-17 NOTE — Op Note (Signed)
Christy [name], MUN              ACCOUNT NO.:  [id number]   MEDICAL RECORD NO.:  [id number]          PATIENT TYPE:  INP   LOCATION:  2550                         FACILITY:  [facility]   PHYSICIAN:  [name]. [name], M.D.  DATE OF BIRTH:  11-06-1940   DATE OF PROCEDURE:  06/12/2009  DATE OF DISCHARGE:                               OPERATIVE REPORT   PREOPERATIVE DIAGNOSES:  1. Right distal radius fracture.  2. Right femoral neck fracture.   POSTOPERATIVE DIAGNOSES:  1. Right distal radius fracture.  2. Right femoral neck fracture.   PROCEDURES:  1. Right open reduction and internal fixation of right distal radius      fracture, performed by Dr. Onalee Hua, dictated on separate note.  2. Right hip hemiarthroplasty, performed by Dr. Durene Romans.   COMPONENTS USED:  DePuy size #7 Tri-Lock stem high offset with a 52  bipolar ball, 28 +1.5 inner ball.   SURGEON:  [name]. [name], MD   ASSISTANT:  Yetta Glassman. Loreta Ave, PA-C   ANESTHESIA:  General.   BLOOD LOSS FOR HIP REPLACEMENT:  Approximately 400 mL.   DRAINS:  One Hemovac.   SPECIMENS:  None.   COMPLICATIONS:  None.   INDICATION OF PROCEDURE:  Ms. Decoursey is a 70 year old female, who  took an unfortunate fall this evening, injuring her right side of her  body.  She had distal clavicle fracture, nondisplaced distal radius  fracture that was comminuted in addition to a right femoral neck  fracture.  She is [place], speaks no [place], but she has a daughter  who helped to gather information as well as consent after reviewing  risks and benefits of the above said procedures.  Consent was obtained  for open reduction and internal fixation, management and allowing for  mobilization.   PROCEDURE IN DETAIL:  The patient was brought to the operative theater.  Once adequate anesthesia, preoperative antibiotics, Ancef administered,  the patient was positioned in the left lateral decubitus position.  Please note that the first part  of the procedure was the right distal  radius fracture treated in a supine position by Dr. Onalee Hua, please  see dictated operative note for this.   Once she was comfortably positioned with the right upper extremity  padded and protected, the patient was positioned with an axillary roll.  The right lower extremity was then prescrubbed, prepped, and draped in  sterile fashion.  A second time-out was performed identifying the  patient, planned procedure, and extremity.   Lateral-based incision was made.  Sharp dissection was carried down to  the iliotibial band and gluteal fascia.  This was incised posteriorly.  The short external rotators were identified and taken down, separated  from the posterior capsule.  An L-capsulotomy was made preserving the  posterior capsule for later anatomic repair.   The patient was noted to have subcapital femoral neck fracture.  Following exposure, using oscillating saw, I made a neck cut in the  trochanteric fossa removing this ring and bone allowed for removal of  the femoral head.  The femoral head was measured in the back table  and  noted to be between the 52 and 53 head size, I chose to use 52.   At this point, the proximal femur was opened with the drill, hand reamed  once, and then irrigated to prevent fat emboli.  Further debridement was  carried out as necessary.  I then began broaching with one broach  anteversion at 10-20 degrees more of what she had natively had.  I  broached all way up to a size 8 initially.  When I did my initial trial  reduction with 8 high necks, I felt that there was too much length, it  was difficult to reduce; for this reason, I went back to the 7 broach,  impacted it down, and I use the calcar planer to remove another 5-6 mm  of bone.   With a 7 broach firmly in place within the metaphysis, a high-offset  neck was placed and a trial reduction was carried out.  Initially, I was  going to attempt a unipolar ball.   However, due to her habitus, there  was a little bit of subluxation at about 60-70 degrees of internal  rotation despite having significantly more anteversion which she  natively had.   At this point based on trial reduction after further debridement and  irrigation of the hip joint, the final 7 high Tri-Lock stem was then  impacted, it sat at about the level where the broach was and based on  this and my trial reduction, I chose a 1.5 inner ball and 52 bipolar  ball.   This construct was made on the back table and then impacted onto a clean  and dry trunnion.  Hip was reduced.  We irrigated the hip again at this  point.  I reapproximated the posterior capsule, superior leaflet using  #1 Vicryl.  A medium Hemovac drain was placed in the deep subcapsular  tissue.  The iliotibial band and gluteal fascia were then reapproximated  over top of this drain, 2-0 Vicryl in the subcu layer, and running 4-0  Monocryl in the skin.  Skin was cleaned, dried, and dressed sterilely  with Steri-Strips and a Mepilex dressing.  The plan per anesthesia was  to keep her intubated and go directly to the intensive care unit for  observation overnight based on the pulmonary status and rib fractures.      [name] [name], M.D.  Electronically Signed     MDO/MEDQ  D:  06/12/2009  T:  06/12/2009  Job:  [id number]

## 2011-03-17 NOTE — Discharge Summary (Signed)
Christy [name], Christy [name]              ACCOUNT NO.:  [id number]   MEDICAL RECORD NO.:  [id number]          PATIENT TYPE:  INP   LOCATION:  5009                         FACILITY:  [facility]   PHYSICIAN:  Gabrielle Dare. [name], M.D.DATE OF BIRTH:  [birth year]   DATE OF ADMISSION:  06/11/2009  DATE OF DISCHARGE:  06/21/2009                               DISCHARGE SUMMARY   CONSULTANTS:  1. [name] [name], M.D.  2. [name]. [name], M.D., Hand Surgery.   DISCHARGE DIAGNOSES:  1. Fall down steps.  2. Right-sided rib fractures.  3. Right femoral neck fracture.  4. Right distal radius and ulna fractures.  5. Left thumb tuft fracture.  6. Right clavicle fracture.  7. 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  8. Scalp hematoma.  9. Morbid obesity.  10.Diabetes mellitus.  11.Hypertension  12.Hypercholesterolemia.  13.Some chronic pain issues.  14.Tobacco abuse.  15.Chronic obstructive pulmonary disease.  16.Vent dependent respiratory failure secondary to multi-trauma,      resolved.  17.Acute blood loss anemia, improving at discharge.  18.Venous thromboembolism prophylaxis on Lovenox at discharge.  19.Dysphagia, much improved at discharge.   HISTORY:  This is a 70 year old Christy [name] female who became dizzy and  apparently fell down 4 stairs.  She speaks very little [place], but her  family is bilingual and was able to interpret.  She was complaining of  right-sided arm, chest and hip pain.  Workup at this time including  radiographs of her chest which showed several right rib fractures  without pneumothorax.  Pelvic films showed right femoral neck fracture.  Right wrist films showed distal radius and ulna fractures.  Right knee x-  rays were negative for acute fracture.  Right elbow was negative for  acute fracture.  Right shoulder demonstrated a right clavicle fracture.  She also had evidence of a nonunion of a previous proximal humerus  fracture from a previous fall.   CT scan of the head demonstrated no acute intracranial hemorrhage, she  did have some scalp hematoma on the right.  CT scan of the C-spine  showed no evidence of fracture or dislocation.  CT scan of the chest  showed several rib fractures, but no pneumothorax, no other injuries.  CT scan of the abdomen and pelvis showed no evidence for organ injury,  no free fluid.  The patient had multiple comorbidities as noted.   She was seen in consultation by both Hand Surgery and Orthopedic  Surgery, and was taken to the OR for open reduction and internal  fixation of her right distal radius fracture and right hip  hemiarthroplasty per Dr. [name] and Dr. [name].  She remained on the  ventilator postoperatively secondary to history of multiple rib  fractures, but she was able to be extubated successfully by June 13, 2009.  She tolerated extubation well and has improved well from the  standpoint of her lungs.  She was mobilized with therapies, but  initially did quite poorly.  She is weightbearing as tolerated on her  right lower extremity and she is nonweightbearing in her right upper  extremity.  She is  weightbearing through the wrist and the fingers on  the left hand as tolerated, but no weightbearing through the thumb.  She  has been able to do some transfers with physical therapy and has made  some progress with this, but is continuing to require more assistance  than her family is able to provide at discharge.  She is therefore being  discharged to a skilled nursing facility to continue to work on her  mobility status.  She will need to see both Dr. [name] and Dr. [name] over  the next 7-10 days.   From a medical standpoint, her diabetes has been somewhat difficult to  control due to her initially having some difficulty swallowing.  Her  diet was initially limited to a dysphagia III nectar-thick liquid diet,  but this has since been advanced to a dysphagia III thin liquid diet.  Her blood  sugars are now under better control on b.i.d. Lantus insulin  30 units b.i.d. and we will restart the patient on her Actos at  discharge which she had previously been on.  She will continue to  require close monitoring of her blood sugars to improve her status.  Initially we were unable to take blood pressures in the patient's upper  extremities and she was therefore having blood pressures taken only in  her lower extremities.  She was quite hypertensive and required  additional medication to be added in and in addition to her usual home  medications.  At the time of discharge, she continues to have some  poorly controlled hypertension at times, although this has been somewhat  variable.  Currently she is on lisinopril 40 mg daily, Lopressor 25 mg  daily which is a new medication and we have recently started her on  clonidine.  The dose has most recently been increased to 0.2 mg b.i.d.  It appears that she does need further close monitoring of her blood  pressures and further adjustments to her blood pressure medications.   She continues on Lovenox 60 mg subcutaneous q.12 h. for venous  thrombosis prophylaxis and will continue to require observation and  continued Lovenox for prevention.  She had a significant decline in her  hemoglobin and hematocrit.  Note, hemoglobin as low as 8.0.  She was  started on iron supplementation as well as a short course of Aranesp.  This will need further followup on an outpatient basis.  At this time,  the patient again is being prepared for discharge to skilled nursing.   DISCHARGE MEDICATIONS:  1. Lovenox 60 mg subcutaneous q.12 h.  2. Ferrous sulfate 325 mg t.i.d.  3. Aranesp 100 mcg subcutaneous every Friday through July 05, 2009.  4. Lisinopril 40 mg p.o. daily.  5. Tricor 145 mg daily.  6. Lopressor 25 mg b.i.d.  7. Colace 100 mg b.i.d.  8. Protonix 40 mg daily.  9. Lantus insulin 30 units subcutaneous b.i.d.  10.Actos 45 mg q.p.m.   11.Crestor 20 mg q.p.m.  12.Clonidine 0.2 mg b.i.d.  13.Senna tabs 2 p.o. q.h.s.  14.Percocet 5/325 one-two p.o. q.4 h. p.r.n. pain, number 40.  15.Robaxin 500 mg one-two p.o. q.6 h. p.r.n. muscle spasm.   DISCHARGE INSTRUCTIONS:  She is weightbearing as tolerated on the right  lower extremity.  She is nonweightbearing through the right upper  extremity.  She is nonweightbearing through the left thumb, but may  weight bear through her hand and wrist.   FOLLOW UP:  Follow up with  Dr. [name] and Dr. [name] at [facility].  Arrangements may need to be made for ambulance  transportation if her mobility status has not improved sufficiently  within the next 7-10 days for followup there.  Their telephone number is  [phone].  The nursing home will need to arrange for these appointments  and transportation should this be necessary.   DIET:  Dysphagia III thin liquids, diabetic restriction and low-sodium,  heart-healthy restrictions.   She does not require formal follow up with the trauma service, but  please do not hesitate to call for questions or concerns, [phone]  is the telephone number.      Shawn Rayburn, P.A.      Gabrielle Dare [name], M.D.  Electronically Signed    SR/MEDQ  D:  06/21/2009  T:  06/21/2009  Job:  [id number]   cc:   [name] [name], M.D.  [name]. [name], M.D.  [facility]

## 2011-03-17 NOTE — H&P (Signed)
Christy [name], Christy [name]              ACCOUNT NO.:  [id number]   MEDICAL RECORD NO.:  [id number]          PATIENT TYPE:  EMS   LOCATION:  MAJO                         FACILITY:  [facility]   PHYSICIAN:  Adolph Pollack, M.D.DATE OF BIRTH:  1941/11/02   DATE OF ADMISSION:  06/11/2009  DATE OF DISCHARGE:                              HISTORY & PHYSICAL   HISTORY:  This is a 70 year old [place] female, who became dizzy and  fell down four stairs.  She speaks no [place].  Her daughter is  bilingual.  She presented to the emergency department where she was  found to have numerous bony injuries and we were asked to see her.   PAST MEDICAL HISTORY:  1. Diabetes mellitus.  2. Hypertension.  3. Hypercholesterolemia.  4. Obesity.   PREVIOUS OPERATIONS:  None.   ALLERGIES:  None.   MEDICATIONS:  Actos, NovoLog, Crestor, tramadol, TriCor, and lisinopril.   PRIMARY CARE PHYSICIAN:  Dr. Audria Nine at [facility].   REVIEW OF SYSTEMS:  Per daughter, no cardiac disease.  No strokes.  No  seizures.  No ulcers or chronic abdominal disease.  She has intermittent  abdominal pains.   PHYSICAL EXAMINATION:  GENERAL:  An obese female appears to be in no  acute distress.  VITAL SIGNS:  Temperature is 98.5, blood pressure is 141/76, pulse 98,  respiratory rate 22, and O2 saturation 95% on room air.  HEENT:  There is a right parietal scalp ecchymosis and swelling.  PERRL.  EOMI.  No external ear trauma.  No crept and no facial step-offs.  NECK:  Trachea midline.  No C-spine tenderness.  PULMONARY:  There is right lateral chest wall tenderness with breath  sounds equal and clear.  CARDIOVASCULAR:  Regular rate and regular rhythm.  ABDOMEN:  Soft, obese, and nontender.  PELVIS:  There is right-sided pelvic tenderness noted.  MUSCULOSKELETAL:  There is right wrist swelling and tenderness.  BACK:  No spinal tenderness on palpation.  NEUROLOGIC:  Glasgow coma scale was 15.   LABORATORY DATA:  Notable for  blood sugar 343, otherwise blood sugar  within normal limits.  Hemoglobin is 13.7.   X-Rays:  Chest x-ray demonstrates right rib fractures.  No pneumothorax.  Pelvis x-ray as well as right femur x-ray demonstrate a femoral neck  fracture.  Right wrist x-ray demonstrates distal radius and ulnar  fractures.  Right knee x-ray, no fracture dislocation.  Right elbow x-  ray, no fracture dislocation.  Right shoulder x-ray demonstrates right  clavicle fracture.   CT of the head demonstrates no intracranial hemorrhage.  Right scalp  hematoma is noted.  CT of the neck, no fracture dislocation.  CT of the  chest, preliminary report demonstrates no pneumothorax or great vessel  injury.  CT of the abdomen and pelvis preliminary shows no solid organ  injury or free fluid.   IMPRESSION:  Status post fall following injuries:  1. Right rib fractures.  2. Right clavicle fracture.  3. Right distal ulna and radius fracture.  4. Right femoral neck fracture.   Also has comorbidities of diabetes mellitus, hypertension, and obesity.  PLAN:  We will admit to the hospital, place in DVT prophylaxis.  We will  obtain orthopedic consultation.  We will concentrate on incentive  spirometry and start sliding scale insulin.  All this has been discussed  with her daughter.      Adolph Pollack, M.D.  Electronically Signed     TJR/MEDQ  D:  06/11/2009  T:  06/12/2009  Job:  [id number]

## 2011-03-17 NOTE — Op Note (Signed)
NAMERAYLI, WIEDERHOLD              ACCOUNT NO.:  [id number]   MEDICAL RECORD NO.:  [id number]          PATIENT TYPE:  INP   LOCATION:  3303                         FACILITY:  [facility]   PHYSICIAN:  [name]. Gramig, M.D.DATE OF BIRTH:  1941-02-07   DATE OF PROCEDURE:  06/17/2009  DATE OF DISCHARGE:                               OPERATIVE REPORT   Christy [name] continues to be monitored in the [facility]  system.  She is fairly stable at this juncture.  Her right upper  extremity ORIF was greater than 5 part intra-articular distal radius  fracture with extreme comminution.  I should note for the record.   She also has left thumb distal phalanx fracture, left thumb proximal  phalanx fracture, and left thumb metacarpal head fracture.  These  fractures were stable.  We performed a mobilization technique for these  areas and will plan nonoperative treatment for the left thumb, keeping  her index through small fingers able to be moved for hygiene purposes.  We will continue close observatory watch of her fractures including the  right shoulder (distal clavicle) as well as the ORIF of the radius and  ulna was performed.  It was pleasure to see her.  All questions have  been addressed.      [name]. [name], M.D.  Electronically Signed     WMG/MEDQ  D:  06/17/2009  T:  06/17/2009  Job:  [id number]

## 2011-03-17 NOTE — Op Note (Signed)
Christy [name], Christy [name]              ACCOUNT NO.:  [id number]   MEDICAL RECORD NO.:  [id number]          PATIENT TYPE:  INP   LOCATION:  2305                         FACILITY:  [facility]   PHYSICIAN:  [name]. Gramig, M.D.DATE OF BIRTH:  08/04/1941   DATE OF PROCEDURE:  DATE OF DISCHARGE:                               OPERATIVE REPORT   PREOPERATIVE DIAGNOSES:  1. Closed minimally displaced left distal clavicle fracture with      associated malunion of the right humerus.  2. Acutely displaced comminuted intra-articular radius fracture, right      upper extremity at the wrist level.  3. Ulnar shaft distal end fracture.   POSTOPERATIVE DIAGNOSES:  1. Closed minimally displaced left distal clavicle fracture with      associated malunion of the right humerus.  2. Acutely displaced comminuted intra-articular radius fracture, right      upper extremity at the wrist level.  3. Ulnar shaft distal end fracture.   SURGERY PERFORMED:  1. Open reduction and internal fixation right distal radius fracture      with sliding brachioradialis tenotomy.  2. Closed treatment ulnar shaft fracture distally.  3. Stress radiography.  4. Closed treatment distal clavicle fracture right shoulder girdle.   SURGEON:  [name]. [name], MD   ASSISTANT:  [name]. [name], MD   COMPLICATIONS:  None.   ANESTHESIA:  General.   TOURNIQUET TIME:  Less than an hour.   DRAINS:  One.   INDICATIONS FOR THE PROCEDURE:  This patient is a 70 year old female who  presents with the above-mentioned diagnosis.  I have counseled her in  regards to risks and benefits of surgery including risk of infection,  bleeding, anesthesia, damage to normal structures, and failure of  surgery to accomplish its intended goals of relieving symptoms and  restoring function.  With this in mind, she decided to proceed.  All  questions have been encouraged and answered preoperatively.   OPERATIVE PROCEDURE:  The patient was seen by  myself and Anesthesia,  taken to the operating suite and underwent a smooth induction of general  anesthesia.  Arm was marked, time-out called, tourniquet applied.  She  was prepped and draped in usual sterile fashion.  Following this, volar  radial approach was made to the wrist.  FCR was incised palmarly and  dorsally.  The carpal canal contents were swept ulnarly, pronator was  elevated off the radius, fracture was accessed, large amount of  comminution was noted, cancellous bone chips as well as bone graft of  the osteoblast for IV were packed into the defect followed by  application of DVR plate.  Adequate position was obtained with  recreation of volar height, inclination, and volar tilt.  I was pleased  with the findings.  Live fluoro demonstrated good quality of the  reduction and stability without tendency towards collapse.  I was  pleased with the findings.  I then performed closure of the pronator,  irrigation, closure of the subcu with Vicryl, closure of the skin edge  with Prolene.  This was done with tourniquet deflated and a TLS drain  was  placed to allow for postop egression of fluid.  Once this was done,  attention was turned towards the ulna.  Manipulation under anesthesia  was provided then I chose to treat this close given all of her issues  including her medical status, swelling, possible need for prolonged  recovery, given the rib fractures, etc.  This was chosen to be treated  close.  Following this, I checked her shoulder.  She was stable.  She  had a distal clavicle fracture, which we chose to treat nonoperatively  as well.  She did not have any high riding distal end or gross  instability.  Once this was done, dressing was placed, sugar-tong cast  applied, excellent refill was noted, drain was in good position, and she  was then turned to the next portion of the procedure, which was hip  arthroplasty performed by Dr. [name], which will be dictated under  separate  if any.   She will be admitted for IV antibiotics, general postop observation.  Unfortunately, given her history of diabetes, smoking, and obesity as  well as the rib fractures, she is to set up for problems postop and thus  we want to watch her very closely.  Trauma Surgery has admitted her and  is very keenly aware of these issues and we will try to maximize her  medical status.  I have discussed all issues with her at length, do's  and dont's, etc, and all questions have been encouraged and answered.      [name]. [name], M.D.  Electronically Signed     WMG/MEDQ  D:  06/12/2009  T:  06/12/2009  Job:  [id number]

## 2011-06-18 ENCOUNTER — Encounter (HOSPITAL_BASED_OUTPATIENT_CLINIC_OR_DEPARTMENT_OTHER): Payer: Medicare Other | Admitting: Oncology

## 2011-06-18 ENCOUNTER — Other Ambulatory Visit: Payer: Self-pay | Admitting: Oncology

## 2011-06-18 DIAGNOSIS — C50419 Malignant neoplasm of upper-outer quadrant of unspecified female breast: Secondary | ICD-10-CM

## 2011-06-18 DIAGNOSIS — C50919 Malignant neoplasm of unspecified site of unspecified female breast: Secondary | ICD-10-CM

## 2011-06-18 DIAGNOSIS — Z5111 Encounter for antineoplastic chemotherapy: Secondary | ICD-10-CM

## 2011-06-18 DIAGNOSIS — Z853 Personal history of malignant neoplasm of breast: Secondary | ICD-10-CM

## 2011-06-18 DIAGNOSIS — Z452 Encounter for adjustment and management of vascular access device: Secondary | ICD-10-CM

## 2011-06-18 LAB — COMPREHENSIVE METABOLIC PANEL
AST: 16 U/L (ref 0–37)
Albumin: 4.1 g/dL (ref 3.5–5.2)
Alkaline Phosphatase: 55 U/L (ref 39–117)
Glucose, Bld: 408 mg/dL — ABNORMAL HIGH (ref 70–99)
Potassium: 4.3 mEq/L (ref 3.5–5.3)
Sodium: 137 mEq/L (ref 135–145)
Total Bilirubin: 0.5 mg/dL (ref 0.3–1.2)
Total Protein: 7.1 g/dL (ref 6.0–8.3)

## 2011-06-18 LAB — CBC WITH DIFFERENTIAL/PLATELET
Basophils Absolute: 0 10*3/uL (ref 0.0–0.1)
MCV: 90.4 fL (ref 79.5–101.0)
MONO#: 0.4 10*3/uL (ref 0.1–0.9)
NEUT%: 72.8 % (ref 38.4–76.8)
Platelets: 184 10*3/uL (ref 145–400)
RDW: 13.6 % (ref 11.2–14.5)
lymph#: 1 10*3/uL (ref 0.9–3.3)

## 2011-06-18 LAB — VITAMIN D 25 HYDROXY (VIT D DEFICIENCY, FRACTURES): Vit D, 25-Hydroxy: 25 ng/mL — ABNORMAL LOW (ref 30–89)

## 2011-07-08 ENCOUNTER — Ambulatory Visit
Admission: RE | Admit: 2011-07-08 | Discharge: 2011-07-08 | Disposition: A | Discharge status: Home / Self Care | Payer: Medicare Other | Source: Ambulatory Visit | Attending: Oncology | Admitting: Oncology

## 2011-07-08 DIAGNOSIS — Z853 Personal history of malignant neoplasm of breast: Secondary | ICD-10-CM

## 2011-08-03 ENCOUNTER — Ambulatory Visit (INDEPENDENT_AMBULATORY_CARE_PROVIDER_SITE_OTHER): Payer: Medicare Other | Admitting: General Surgery

## 2011-08-03 ENCOUNTER — Encounter (INDEPENDENT_AMBULATORY_CARE_PROVIDER_SITE_OTHER): Payer: Self-pay | Admitting: General Surgery

## 2011-08-03 VITALS — BP 146/98 | HR 72 | Temp 97.9°F | Resp 18 | Ht 64.0 in | Wt 218.6 lb

## 2011-08-03 DIAGNOSIS — Z853 Personal history of malignant neoplasm of breast: Secondary | ICD-10-CM

## 2011-08-03 NOTE — Progress Notes (Signed)
Chief Complaint  Patient presents with  . Other    est pt new prob- eval removal of pac    HPI Christy [name] is a 70 y.o. female.   HPI This is a 70 year old female who initially was diagnosed with a right breast cancer in July of 2011. She underwent a right breast wire-guided lumpectomy followed by an excision of a margin as well as an axillary sentinel node biopsy. There were 2 of 6 nodes positive with her sentinel node biopsy. We discussed her in multidisciplinary conference and elected to not continue with her axillary dissection would treat her medically. She has undergone both chemotherapy and radiation therapy this point. She has done well without any real new complaints. She is now on a arimidex. She reports no complaints and no real new problems either. She had a mammogram 2 weeks ago that she can follow up again in a year. She is here today to discuss her port removal.  Past Medical History  Diagnosis Date  . Hyperlipidemia   . Hypertension   . Diabetes mellitus   . Cancer     stage II right breast cancer    Past Surgical History  Procedure Date  . Breast surgery     right lumpectomy, sentinel node biopsy  . Portacath placement   . Joint replacement     right hip  . Fracture surgery     ORIF right distal radius fx    History reviewed. No pertinent family history.  Social History History  Substance Use Topics  . Smoking status: Former Games developer  . Smokeless tobacco: Not on file   Comment: quit 2009  . Alcohol Use: No    No Known Allergies  Current Outpatient Prescriptions  Medication Sig Dispense Refill  . ANASTROZOLE PO Take by mouth.        . CLONIDINE HCL PO Take by mouth.        . Fenofibrate (TRICOR PO) Take by mouth.        . FUROSEMIDE PO Take by mouth.        [name] LISINOPRIL PO Take by mouth.        . METOPROLOL SUCCINATE PO Take by mouth.        . nystatin (MYCOSTATIN) cream Apply topically 2 (two) times daily.        . Rosuvastatin Calcium (CRESTOR  PO) Take by mouth.          Review of Systems Review of Systems  Constitutional: Negative.   HENT: Negative.   Eyes: Negative.   Respiratory: Negative.   Musculoskeletal: Positive for arthralgias and gait problem (uses cane).  All other systems reviewed and are negative.    Blood pressure 146/98, pulse 72, temperature 97.9 F (36.6 C), temperature source Temporal, resp. rate 18, height 5\' 4"  (1.626 m), weight 218 lb 9.6 oz (99.156 kg).  Physical Exam Physical Exam  Constitutional: She appears well-developed and well-nourished.  Eyes: No scleral icterus.  Neck: Neck supple.  Cardiovascular: Normal rate, regular rhythm and normal heart sounds.   Pulmonary/Chest: Effort normal and breath sounds normal. She has no wheezes. She has no rales.       Left sided port in place   Abdominal: Soft. There is no tenderness.  Lymphadenopathy:    She has no cervical adenopathy.     Assessment    Stage II right breast cancer s/p surgery, chemo/xrt now on arimidex    Plan    I discussed through her daughter today port  removal as well as risks and benefits associated with that.  Will schedule for next few weeks.  We also discussed long term follow up plan        [facility] 08/03/2011, 9:46 AM

## 2011-08-12 ENCOUNTER — Encounter (HOSPITAL_BASED_OUTPATIENT_CLINIC_OR_DEPARTMENT_OTHER)
Admission: RE | Admit: 2011-08-12 | Discharge: 2011-08-12 | Disposition: A | Discharge status: Home / Self Care | Payer: Medicare Other | Source: Ambulatory Visit | Attending: General Surgery | Admitting: General Surgery

## 2011-08-12 DIAGNOSIS — Z01810 Encounter for preprocedural cardiovascular examination: Secondary | ICD-10-CM | POA: Insufficient documentation

## 2011-08-12 DIAGNOSIS — Z01812 Encounter for preprocedural laboratory examination: Secondary | ICD-10-CM | POA: Insufficient documentation

## 2011-08-12 LAB — CBC
HCT: 37.8 % (ref 36.0–46.0)
Hemoglobin: 12.6 g/dL (ref 12.0–15.0)
MCH: 30.2 pg (ref 26.0–34.0)
MCHC: 33.3 g/dL (ref 30.0–36.0)
MCV: 90.6 fL (ref 78.0–100.0)
RBC: 4.17 MIL/uL (ref 3.87–5.11)

## 2011-08-12 LAB — BASIC METABOLIC PANEL
BUN: 14 mg/dL (ref 6–23)
CO2: 28 mEq/L (ref 19–32)
Calcium: 10.8 mg/dL — ABNORMAL HIGH (ref 8.4–10.5)
Creatinine, Ser: 0.92 mg/dL (ref 0.50–1.10)
GFR calc non Af Amer: 61 mL/min — ABNORMAL LOW (ref >90)
Glucose, Bld: 385 mg/dL — ABNORMAL HIGH (ref 70–99)

## 2011-08-13 ENCOUNTER — Ambulatory Visit (HOSPITAL_BASED_OUTPATIENT_CLINIC_OR_DEPARTMENT_OTHER): Admission: RE | Admit: 2011-08-13 | Payer: Medicare Other | Source: Ambulatory Visit | Admitting: General Surgery

## 2011-08-14 ENCOUNTER — Telehealth (INDEPENDENT_AMBULATORY_CARE_PROVIDER_SITE_OTHER): Payer: Self-pay

## 2011-08-14 NOTE — Telephone Encounter (Signed)
Called pt's daughter to explain to her that I need to work on getting the pt scheduled for a cardiology appt to get cardiac clearance on her before we can reschedule her surgery again. The daughter understands and awaits my callback with the appt info.Christy [name]

## 2011-08-19 ENCOUNTER — Telehealth (INDEPENDENT_AMBULATORY_CARE_PROVIDER_SITE_OTHER): Payer: Self-pay

## 2011-08-19 DIAGNOSIS — C50919 Malignant neoplasm of unspecified site of unspecified female breast: Secondary | ICD-10-CM

## 2011-08-19 NOTE — Telephone Encounter (Signed)
Called pt's daughter to notify them of the appt with cardiologist Dr Elease Hashimoto for 08-21-11 @9 :15 to get cleared for sx to get PAC removed. The pt was scheduled last wk but got canceled b/c of a abnormal EKG.Hulda Humphrey

## 2011-08-21 ENCOUNTER — Encounter: Payer: Self-pay | Admitting: Cardiovascular Disease

## 2011-08-21 ENCOUNTER — Ambulatory Visit (INDEPENDENT_AMBULATORY_CARE_PROVIDER_SITE_OTHER): Payer: Medicare Other | Admitting: Cardiovascular Disease

## 2011-08-21 DIAGNOSIS — Z01818 Encounter for other preprocedural examination: Secondary | ICD-10-CM

## 2011-08-21 DIAGNOSIS — R9431 Abnormal electrocardiogram [ECG] [EKG]: Secondary | ICD-10-CM

## 2011-08-21 NOTE — Assessment & Plan Note (Signed)
Aries presents with an abnormal EKG. She has not had any episodes of chest pain or shortness breath. She's had small inferior Q waves as far back as 2010.  Her daughter was with her today and acted as her interpreter. Heart is completely asymptomatic. She is limited by some orthopedic problems and has had hip surgery in the past.  At this point I don't think it would need to take a very aggressive approach. We'll get an echocardiogram to assess her left ventricle systolic function. 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  Her for left ventricle systolic function is well-preserved normal we would deal to clear her for her Port-A-Cath removal.

## 2011-08-21 NOTE — Patient Instructions (Addendum)
Your physician has requested that you have an echocardiogram. Echocardiography is a painless test that uses sound waves to create images of your heart. It provides your doctor with information about the size and shape of your heart and how well your heart's chambers and valves are working. This procedure takes approximately one hour. There are no restrictions for this procedure.  Your physician recommends that you schedule a follow-up appointment in: WHEN NEEDED BASIS

## 2011-08-21 NOTE — Progress Notes (Signed)
Christy [name] Date of Birth  1941-02-09 [facility] [address]    [address] [place], [place]  [address] [phone]  Fax  [phone]  History of Present Illness:  Christy [name] is a 70 year old female from [place]. She has a history of breast cancer and has an indwelling central line.  She was recently seen by surgeons to schedule removal of the central line and was found have an abnormal EKG.  She's not had any episodes of chest pain, shortness breath, syncope, or presyncope.  The EKG from the surgeon's office revealed inferior Q waves and she was sent here for further evaluation.    Current Outpatient Prescriptions on File Prior to Visit  Medication Sig Dispense Refill  . ANASTROZOLE PO Take by mouth.       . CLONIDINE HCL PO Take 0.2 mg by mouth 2 (two) times daily.       . Fenofibrate (TRICOR PO) Take 145 mg by mouth daily.       . FUROSEMIDE PO Take 20 mg by mouth daily.       [name] LISINOPRIL PO Take 40 mg by mouth daily.       [name] METOPROLOL SUCCINATE PO Take 50 mg by mouth daily.       . Rosuvastatin Calcium (CRESTOR PO) Take 20 mg by mouth daily.       [name] nystatin (MYCOSTATIN) cream Apply topically 2 (two) times daily.          No Known Allergies  Past Medical History  Diagnosis Date  . Hyperlipidemia   . Hypertension   . Diabetes mellitus   . Cancer     stage II right breast cancer    Past Surgical History  Procedure Date  . Breast surgery     right lumpectomy, sentinel node biopsy  . Portacath placement   . Joint replacement     right hip  . Fracture surgery     ORIF right distal radius fx    History  Smoking status  . Former Smoker  Smokeless tobacco  . Not on file  Comment: quit 2009    History  Alcohol Use No    No family history on file.  Reviw of Systems:  Reviewed in the HPI.  All other systems are negative.  Physical Exam: BP 104/77  Pulse 73  Ht 5\' 4"  (1.626 m)  Wt 217 lb 12.8 oz (98.793 kg)  BMI 37.39 kg/m2 The patient is  alert and oriented x 3.  The mood and affect are normal.   Skin: warm and dry.  Color is normal.    HEENT:   No JVD. Normal carotids.   Lungs: Clear to auscultation   Heart: Regular rate S1-S2. Chest no murmurs.    Abdomen: Normal bowel sounds.  Extremities:  No edema. No palpable cords  Neuro:  Nonfocal. Her strength is 4+. Cranial nerves are intact.     ECG: Normal sinus rhythm. She has Q waves in inferior leads.  Assessment / Plan:

## 2011-08-28 ENCOUNTER — Ambulatory Visit (HOSPITAL_COMMUNITY): Payer: Medicare Other | Attending: Cardiology | Admitting: Radiology

## 2011-08-28 DIAGNOSIS — E119 Type 2 diabetes mellitus without complications: Secondary | ICD-10-CM | POA: Insufficient documentation

## 2011-08-28 DIAGNOSIS — R9431 Abnormal electrocardiogram [ECG] [EKG]: Secondary | ICD-10-CM

## 2011-08-28 DIAGNOSIS — Z87891 Personal history of nicotine dependence: Secondary | ICD-10-CM | POA: Insufficient documentation

## 2011-08-28 DIAGNOSIS — Z853 Personal history of malignant neoplasm of breast: Secondary | ICD-10-CM | POA: Insufficient documentation

## 2011-08-28 DIAGNOSIS — E785 Hyperlipidemia, unspecified: Secondary | ICD-10-CM | POA: Insufficient documentation

## 2011-08-28 DIAGNOSIS — I10 Essential (primary) hypertension: Secondary | ICD-10-CM | POA: Insufficient documentation

## 2011-08-28 DIAGNOSIS — Z01818 Encounter for other preprocedural examination: Secondary | ICD-10-CM

## 2011-08-28 DIAGNOSIS — E669 Obesity, unspecified: Secondary | ICD-10-CM | POA: Insufficient documentation

## 2011-09-11 ENCOUNTER — Ambulatory Visit (HOSPITAL_BASED_OUTPATIENT_CLINIC_OR_DEPARTMENT_OTHER)
Admission: RE | Admit: 2011-09-11 | Discharge: 2011-09-11 | Disposition: A | Discharge status: Home / Self Care | Payer: Medicare Other | Source: Ambulatory Visit | Attending: General Surgery | Admitting: General Surgery

## 2011-09-11 ENCOUNTER — Encounter (HOSPITAL_BASED_OUTPATIENT_CLINIC_OR_DEPARTMENT_OTHER): Payer: Self-pay | Admitting: Anesthesiology

## 2011-09-11 ENCOUNTER — Encounter (HOSPITAL_BASED_OUTPATIENT_CLINIC_OR_DEPARTMENT_OTHER)
Admission: RE | Disposition: A | Discharge status: Home / Self Care | Payer: Self-pay | Source: Ambulatory Visit | Attending: General Surgery

## 2011-09-11 ENCOUNTER — Telehealth (INDEPENDENT_AMBULATORY_CARE_PROVIDER_SITE_OTHER): Payer: Self-pay | Admitting: General Surgery

## 2011-09-11 DIAGNOSIS — C50919 Malignant neoplasm of unspecified site of unspecified female breast: Secondary | ICD-10-CM | POA: Insufficient documentation

## 2011-09-11 DIAGNOSIS — Z452 Encounter for adjustment and management of vascular access device: Secondary | ICD-10-CM

## 2011-09-11 DIAGNOSIS — Z79899 Other long term (current) drug therapy: Secondary | ICD-10-CM | POA: Insufficient documentation

## 2011-09-11 HISTORY — PX: PORT-A-CATH REMOVAL: SHX5289

## 2011-09-11 SURGERY — REMOVAL PORT-A-CATH
Anesthesia: LOCAL | Site: Chest | Laterality: Left | Wound class: Clean

## 2011-09-11 MED ORDER — LIDOCAINE-EPINEPHRINE (PF) 1 %-1:200000 IJ SOLN
INTRAMUSCULAR | Status: DC | PRN
  Administered 2011-09-11: 7 mL

## 2011-09-11 MED ORDER — BUPIVACAINE HCL (PF) 0.25 % IJ SOLN
INTRAMUSCULAR | Status: DC | PRN
  Administered 2011-09-11: 7 mL

## 2011-09-11 MED ORDER — OXYCODONE HCL 5 MG PO TABS: 5.0000 mg | ORAL_TABLET | Freq: Four times a day (QID) | ORAL | Status: AC | PRN

## 2011-09-11 SURGICAL SUPPLY — 27 items
BLADE SURG 15 STRL LF DISP TIS (BLADE) ×1 IMPLANT
BLADE SURG 15 STRL SS (BLADE) ×1
CHLORAPREP W/TINT 26ML (MISCELLANEOUS) ×2 IMPLANT
CLOTH BEACON ORANGE TIMEOUT ST (SAFETY) ×2 IMPLANT
COVER MAYO STAND STRL (DRAPES) ×2 IMPLANT
COVER TABLE BACK 60X90 (DRAPES) ×2 IMPLANT
DECANTER SPIKE VIAL GLASS SM (MISCELLANEOUS) ×2 IMPLANT
DERMABOND ADVANCED (GAUZE/BANDAGES/DRESSINGS) ×1
DERMABOND ADVANCED .7 DNX12 (GAUZE/BANDAGES/DRESSINGS) ×1 IMPLANT
DRAPE PED LAPAROTOMY (DRAPES) ×2 IMPLANT
ELECT COATED BLADE 2.86 ST (ELECTRODE) ×2 IMPLANT
ELECT REM PT RETURN 9FT ADLT (ELECTROSURGICAL) ×2
ELECTRODE REM PT RTRN 9FT ADLT (ELECTROSURGICAL) ×1 IMPLANT
GLOVE BIO SURGEON STRL SZ7 (GLOVE) ×2 IMPLANT
GLOVE ECLIPSE 6.5 STRL STRAW (GLOVE) ×2 IMPLANT
GOWN PREVENTION PLUS XLARGE (GOWN DISPOSABLE) ×4 IMPLANT
NEEDLE HYPO 25X1 1.5 SAFETY (NEEDLE) ×2 IMPLANT
PACK BASIN DAY SURGERY FS (CUSTOM PROCEDURE TRAY) ×2 IMPLANT
PENCIL BUTTON HOLSTER BLD 10FT (ELECTRODE) ×2 IMPLANT
SLEEVE SCD COMPRESS KNEE MED (MISCELLANEOUS) IMPLANT
SUT MON AB 4-0 PC3 18 (SUTURE) ×2 IMPLANT
SUT VIC AB 3-0 SH 27 (SUTURE) ×1
SUT VIC AB 3-0 SH 27X BRD (SUTURE) ×1 IMPLANT
SYR CONTROL 10ML LL (SYRINGE) ×2 IMPLANT
TOWEL OR 17X24 6PK STRL BLUE (TOWEL DISPOSABLE) ×2 IMPLANT
TOWEL OR NON WOVEN STRL DISP B (DISPOSABLE) ×2 IMPLANT
WATER STERILE IRR 1000ML POUR (IV SOLUTION) ×2 IMPLANT

## 2011-09-11 NOTE — Op Note (Signed)
Preoperative diagnosis: Breast cancer status post chemotherapy with port in place  Postoperative diagnosis: Same as above  Procedure: Left subclavian Port-A-Cath removal  Surgeon: Dr. Harden Mo  Anesthesia: Local  Estimated blood loss: None  Complications: None  Specimens: None  Indications: This is a 70 year old female with breast cancer is now status post treatment with chemotherapy. She presents now for port removal. We discussed the risks and benefits prior to beginning.  Procedure: After informed consent was obtained, we went to the operating room. The procedure was performed under local anesthetic. She was prepped and draped in the standard sterile surgical fashion. Surgical timeout was performed.  I used a  combination of 1% lidocaine with epinephrine mixed 50-50 with .25% percent Marcaine.I then made an incision where her prior port incision was. The port was then removed in its entirety without any difficulty. I confirmed this. Pressure was held for 2 minutes and hemostasis was then observed. I removed all the old stitch material as well. I then closed this with 3-0 Vicryl and 4-0 Monocryl. Dermabond was placed over the wound. She tolerated this well and was transferred back to preop in stable condition.

## 2011-09-11 NOTE — Interval H&P Note (Signed)
History and Physical Interval Note:   09/11/2011   9:27 AM   Christy [name]  has presented today for surgery, with the diagnosis of breast cancer with port  The various methods of treatment have been discussed with the patient and family. After consideration of risks, benefits and other options for treatment, the patient has consented to  Procedure(s): REMOVAL PORT-A-CATH as a surgical intervention .  The patients' history has been reviewed, patient examined, no change in status, stable for surgery.  I have reviewed the patients' chart and labs.  Questions were answered to the patient's satisfaction.     [facility]  MD

## 2011-09-11 NOTE — H&P (Signed)
HPI  This is a 70 year old female who initially was diagnosed with a right breast cancer in July of 2011. She underwent a right breast wire-guided lumpectomy followed by an excision of a margin as well as an axillary sentinel node biopsy. There were 2 of 6 nodes positive with her sentinel node biopsy. We discussed her in multidisciplinary conference and elected to not continue with her axillary dissection would treat her medically. She has undergone both chemotherapy and radiation therapy this point. She has done well without any real new complaints. She is now on a arimidex. She reports no complaints and no real new problems either. She had a mammogram 2 weeks ago that she can follow up again in a year. She is here today to discuss her port removal.  Past Medical History   Diagnosis  Date   .  Hyperlipidemia    .  Hypertension    .  Diabetes mellitus    .  Cancer      stage II right breast cancer    Past Surgical History   Procedure  Date   .  Breast surgery      right lumpectomy, sentinel node biopsy   .  Portacath placement    .  Joint replacement      right hip   .  Fracture surgery      ORIF right distal radius fx    History reviewed. No pertinent family history.  Social History  History   Substance Use Topics   .  Smoking status:  Former Games developer   .  Smokeless tobacco:  Not on file     Comment: quit 2009    .  Alcohol Use:  No    No Known Allergies  Current Outpatient Prescriptions   Medication  Sig  Dispense  Refill   .  ANASTROZOLE PO  Take by mouth.     .  CLONIDINE HCL PO  Take by mouth.     .  Fenofibrate (TRICOR PO)  Take by mouth.     .  FUROSEMIDE PO  Take by mouth.     [name]  LISINOPRIL PO  Take by mouth.     .  METOPROLOL SUCCINATE PO  Take by mouth.     .  nystatin (MYCOSTATIN) cream  Apply topically 2 (two) times daily.     .  Rosuvastatin Calcium (CRESTOR PO)  Take by mouth.      Review of Systems  Review of Systems  Constitutional: Negative.  HENT:  Negative.  Eyes: Negative.  Respiratory: Negative.  Musculoskeletal: Positive for arthralgias and gait problem (uses cane).  All other systems reviewed and are negative.   Blood pressure 146/98, pulse 72, temperature 97.9 F (36.6 C), temperature source Temporal, resp. rate 18, height 5\' 4"  (1.626 m), weight 218 lb 9.6 oz (99.156 kg).  Physical Exam  Physical Exam  Constitutional: She appears well-developed and well-nourished.  Eyes: No scleral icterus.  Neck: Neck supple.  Cardiovascular: Normal rate, regular rhythm and normal heart sounds.  Pulmonary/Chest: Effort normal and breath sounds normal. She has no wheezes. She has no rales.  Left sided port in place Abdominal: Soft. There is no tenderness.  Lymphadenopathy:  She has no cervical adenopathy.   Assessment   Stage II right breast cancer s/p surgery, chemo/xrt now on arimidex   Plan   I discussed through her daughter today port removal as well as risks and benefits associated with that. Will schedule for next few weeks.  We also discussed long term follow up plan  She had an abnormal preop EKG and has been evaluated by cardiology.  She has been cleared for no further preoperative evaluation and will proceed under local anesthetic.

## 2011-09-22 ENCOUNTER — Encounter (HOSPITAL_BASED_OUTPATIENT_CLINIC_OR_DEPARTMENT_OTHER): Payer: Self-pay | Admitting: General Surgery

## 2011-10-07 ENCOUNTER — Ambulatory Visit (INDEPENDENT_AMBULATORY_CARE_PROVIDER_SITE_OTHER): Payer: Medicare Other | Admitting: General Surgery

## 2011-10-07 ENCOUNTER — Encounter (INDEPENDENT_AMBULATORY_CARE_PROVIDER_SITE_OTHER): Payer: Self-pay | Admitting: General Surgery

## 2011-10-07 VITALS — BP 106/72 | HR 80 | Temp 96.7°F | Resp 16 | Ht 62.0 in | Wt 219.0 lb

## 2011-10-07 DIAGNOSIS — Z09 Encounter for follow-up examination after completed treatment for conditions other than malignant neoplasm: Secondary | ICD-10-CM

## 2011-10-07 NOTE — Progress Notes (Signed)
Subjective:     Patient ID: Christy [name], female   DOB: 1941/04/02, 70 y.o.   MRN: [id number]  HPI This is a 70 year old female with a history of a stage II breast cancer. She completed all of her treatment and I removed report a few weeks ago. She did well from that and has no complaints.  Review of Systems     Objective:   Physical Exam Left chest port incision healed without infection    Assessment:     Stage II right breast cancer    Plan:        I released her to full activity after port removal. I will plan on seeing her in one year she has followed up with medical oncology in 6 months. She has a mammogram scheduled at the appropriate time already.

## 2011-10-07 NOTE — Patient Instructions (Signed)
???????? ? ??????, ??????????????? ?????? (  Breast Problems and Self-Exam) ??????????? ?????? ????? ???????? ???????????? ???????? ?? ?????? ?????? ? ??????? ?????. ? ????????, ?????? ???????? ??? ??????????? ? ????? ????? ???? ????? ??????. ????????? ???????:   ??????? ?????????? ??????? ??????? ?????? ???????? ???????????????? ??????? ??????????????? ? ?????.   ???????????? - ????????????????? (?? ?????????) ??????? (???????????????) ? ????? ??????????? ???????.   ?????? - ????????????????? ??????? ???????.   ??? ?????.  ??????? ??????????? ??????????????? ??????, ?? ???????, ????? ???????? ??????????? ??? ??????? ? ??? ??? ???????? ?? ?????? ? ??????. ??? ???????? ??? ???????? ????? ????????? ?? ?????? ??????. ????? ??????????????? ??????????? ?????? ????? ??????? ??? ???????????, ??? ? ???? ??? ? ???????.  ??????????????? ?????? ????? ??? ?????????? ?????? ?????????? ????????? ??????, ??????? ???? ?????????. ????? ????? ??? ??????? - ????? 5-7 ???? ????? ????????? ???????. ?? ????? ?????????????? ????? ????? ??????? ?????????, ??? ?????????? ??????????? ?????-???? ?????????. ???? ??????????? ? ??? ?????? ?? ??????????, ????????? ????????? ??? ????????? ?????????????, ????????? ???????????? ????? ? ?????? ???? ??????? ??????. ????? 3-4 ?????? ?? ????? ??????? ??????????? ????? ? ?????? ????? ????????? ??????.   ????????? ?????? ??????????. ??????? ?????????? ?????????? ????????? ??? ?????????? ?????????. ????? ???????, ??? ?? ???? ?????????? ?????? ?? ?????? ??? ??????????? ????????? ? ???????, ???????? ??? ????????? ???????????????. ???????????? ????????? ?????? ? ???? ? ???? ????? ? ???? ?????? ?????, ? ????? ?????????? ???? ?????????????? ?????, ???? ??????????? ??? ????????????.   ????????? ?????. ???????? ????? ????????, ??????? ???? ?? ??????. ????????? ????? ????? ? ?????????, ???? ?? ?????????? - ????????? ????? ??? ??????? ??????, ????????, ???????, ??????? ??? ?????. ????? ?????????  ???????? ?? ????????? ????? ????.   ??????????? ??????, ?????? ?????? ? ?????. ??? ????????? ????? ?? ??????? ??????? ?????????? ? ????????? ?????? ????????.   ?? ????? ???????? ????, ???????? ???? ?? ????? ? ????????? ???????? ?????? ????? ????????? ???????, ??????????? ?????? ???? ??? ??????? (??. ???????). ????????? ???????? ? ?????? ??????. ????? ??????????????? ??? ?????? ????? ?????????. ?????? ??????? ????????? ????????? ???????? ????????? ???????, ???????? ????????.   ????????? ??????, ???? ?? ?????, ????? ??????? ?????? ???? ?? ?????? ? ???????? ?? ????? ???????. ????? ????????? ??????? ????????? ?????????? ??? ?????, ??? ??????????????? ? ??????????. ????????? ? ????????? ??????? ????? ?? 1 ??? ? ??????????? ????????? ?? ??????? ??????? ??? ?????.   ????? ??????? ????? ?????????? ???????? ?????????? ??????? (?????? ??????? ???? ?????? ?????). ???????? ????????, ????? ?????????, ??? ?? ?????? ?????? ?? ??????????. ????? ????????? ????????? ??????, ????????? ?????? ??? ???????????.   ???????, ????????? ??????? ????? ????? ? ????? ???????, ? ????? ????????.  ???? ?? ?????????? ???????????????, ?? ?????????. ??????????? ??????????????? ????????????????? (?? ???). ??????? ?????????? ?? ????? ? ?????????? ??? ?????????. Document Released: 10/19/2005 Document Revised: 07/01/2011 [facility] Patient Information 2012 [place], [place].

## 2011-11-27 ENCOUNTER — Encounter: Payer: Self-pay | Admitting: *Deleted

## 2011-11-27 ENCOUNTER — Telehealth: Payer: Self-pay | Admitting: *Deleted

## 2011-11-27 NOTE — Telephone Encounter (Signed)
patient confirmed over the phone the new date and time on 01-22-2012 starting at 9:00am

## 2012-01-22 ENCOUNTER — Other Ambulatory Visit (HOSPITAL_BASED_OUTPATIENT_CLINIC_OR_DEPARTMENT_OTHER): Payer: Medicare Other | Admitting: Lab

## 2012-01-22 ENCOUNTER — Other Ambulatory Visit: Payer: Self-pay | Admitting: *Deleted

## 2012-01-22 ENCOUNTER — Ambulatory Visit (HOSPITAL_BASED_OUTPATIENT_CLINIC_OR_DEPARTMENT_OTHER): Payer: Medicare Other | Admitting: Oncology

## 2012-01-22 VITALS — BP 131/74 | HR 69 | Temp 98.2°F | Ht 62.0 in | Wt 218.1 lb

## 2012-01-22 DIAGNOSIS — E559 Vitamin D deficiency, unspecified: Secondary | ICD-10-CM

## 2012-01-22 DIAGNOSIS — C50419 Malignant neoplasm of upper-outer quadrant of unspecified female breast: Secondary | ICD-10-CM

## 2012-01-22 DIAGNOSIS — Z853 Personal history of malignant neoplasm of breast: Secondary | ICD-10-CM

## 2012-01-22 DIAGNOSIS — Z452 Encounter for adjustment and management of vascular access device: Secondary | ICD-10-CM

## 2012-01-22 DIAGNOSIS — C50919 Malignant neoplasm of unspecified site of unspecified female breast: Secondary | ICD-10-CM

## 2012-01-22 DIAGNOSIS — Z79811 Long term (current) use of aromatase inhibitors: Secondary | ICD-10-CM

## 2012-01-22 DIAGNOSIS — Z5111 Encounter for antineoplastic chemotherapy: Secondary | ICD-10-CM

## 2012-01-22 LAB — CBC WITH DIFFERENTIAL/PLATELET
Basophils Absolute: 0 10*3/uL (ref 0.0–0.1)
EOS%: 0.6 % (ref 0.0–7.0)
HGB: 13.6 g/dL (ref 11.6–15.9)
MCH: 31 pg (ref 25.1–34.0)
NEUT#: 4.3 10*3/uL (ref 1.5–6.5)
RDW: 14 % (ref 11.2–14.5)
WBC: 6 10*3/uL (ref 3.9–10.3)
lymph#: 1.2 10*3/uL (ref 0.9–3.3)

## 2012-01-22 MED ORDER — ANASTROZOLE 1 MG PO TABS: 1.0000 mg | ORAL_TABLET | Freq: Every day | ORAL | Status: AC

## 2012-01-22 NOTE — Progress Notes (Signed)
Hematology and Oncology Follow Up Visit  Christy [name] [id number] [birth year] 71 y.o. 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   Interim History:  There have been no intercurrent illness, hospitalizations or medication changes.  Medications: I have reviewed the patient's current medications.  Allergies: No Known Allergies  Past Medical History, Surgical history, Social history, and Family History were reviewed and updated.  Review of Systems: Constitutional:  Negative for fever, chills, night sweats, anorexia, weight loss, pain. Cardiovascular: no chest pain or dyspnea on exertion Respiratory: negative Neurological: negative Dermatological: negative ENT: negative Skin Gastrointestinal: negative Genito-Urinary: negative Hematological and Lymphatic: negative Breast: negative Musculoskeletal: negative Remaining ROS negative.  Physical Exam: Blood pressure 131/74, pulse 69, temperature 98.2 F (36.8 C), height 5\' 2"  (1.575 m), weight 218 lb 1.6 oz (98.93 kg). ECOG: 0 General appearance: alert, cooperative and appears stated age Head: Normocephalic, without obvious abnormality, atraumatic Neck: no adenopathy, no carotid bruit, no JVD, supple, symmetrical, trachea midline and thyroid not enlarged, symmetric, no tenderness/mass/nodules Lymph nodes: Cervical, supraclavicular, and axillary nodes normal. Cardiac : regular rate and rhythm, no murmurs or gallops Pulmonary:clear to auscultation bilaterally and normal percussion bilaterally Breasts: inspection negative, no nipple discharge or bleeding, no masses or nodularity palpable Abdomen:soft, non-tender; bowel sounds normal; no masses,  no organomegaly Extremities negative Neuro: alert, oriented, normal speech, no focal findings or  movement disorder noted  Lab Results: Lab Results  Component Value Date   WBC 6.0 01/22/2012   HGB 13.6 01/22/2012   HCT 39.8 01/22/2012   MCV 90.9 01/22/2012   PLT 179 01/22/2012     Chemistry      Component Value Date/Time   NA 139 08/12/2011 1430   K 4.3 08/12/2011 1430   CL 101 08/12/2011 1430   CO2 28 08/12/2011 1430   BUN 14 08/12/2011 1430   CREATININE 0.92 08/12/2011 1430      Component Value Date/Time   CALCIUM 10.8* 08/12/2011 1430   ALKPHOS 55 06/18/2011 0821   ALKPHOS 55 06/18/2011 0821   ALKPHOS 55 06/18/2011 0821   ALKPHOS 55 06/18/2011 0821   AST 16 06/18/2011 0821   AST 16 06/18/2011 0821   AST 16 06/18/2011 0821   AST 16 06/18/2011 0821   ALT 21 06/18/2011 0821   ALT 21 06/18/2011 0821   ALT 21 06/18/2011 0821   ALT 21 06/18/2011 0821   BILITOT 0.5 06/18/2011 0821   BILITOT 0.5 06/18/2011 0821   BILITOT 0.5 06/18/2011 0821   BILITOT 0.5 06/18/2011 0821      .pathology. Radiological Studies: chest X-ray n/a Mammogram Due 5/13 Bone density Due 5/13  Impression and Plan: Hx N+ er/pr+ breast cancer s/p CMF and xrt on arimidex, with no evidence of recurrence Will see in 6 months  More than 50% of the visit was spent in patient-related counselling   Pierce Crane, MD 3/22/201310:24 AM

## 2012-01-22 NOTE — Telephone Encounter (Signed)
gve the pt her oct 2013 appt calendar along with the mammo/bone density appt in sept.

## 2012-01-23 LAB — VITAMIN D 25 HYDROXY (VIT D DEFICIENCY, FRACTURES): Vit D, 25-Hydroxy: 19 ng/mL — ABNORMAL LOW (ref 30–89)

## 2012-01-23 LAB — COMPREHENSIVE METABOLIC PANEL
ALT: 23 U/L (ref 0–35)
Albumin: 4.1 g/dL (ref 3.5–5.2)
BUN: 12 mg/dL (ref 6–23)
CO2: 27 mEq/L (ref 19–32)
Calcium: 10.3 mg/dL (ref 8.4–10.5)
Chloride: 99 mEq/L (ref 96–112)
Creatinine, Ser: 0.94 mg/dL (ref 0.50–1.10)
Potassium: 4.6 mEq/L (ref 3.5–5.3)

## 2012-07-25 ENCOUNTER — Inpatient Hospital Stay: Admission: RE | Admit: 2012-07-25 | Payer: Medicare Other | Source: Ambulatory Visit

## 2012-07-25 ENCOUNTER — Ambulatory Visit
Admission: RE | Admit: 2012-07-25 | Discharge: 2012-07-25 | Disposition: A | Discharge status: Home / Self Care | Payer: Medicare Other | Source: Ambulatory Visit | Attending: Oncology | Admitting: Oncology

## 2012-07-25 DIAGNOSIS — Z853 Personal history of malignant neoplasm of breast: Secondary | ICD-10-CM

## 2012-07-25 DIAGNOSIS — E559 Vitamin D deficiency, unspecified: Secondary | ICD-10-CM

## 2012-07-26 ENCOUNTER — Telehealth: Payer: Self-pay | Admitting: *Deleted

## 2012-07-26 ENCOUNTER — Other Ambulatory Visit: Payer: Self-pay | Admitting: Oncology

## 2012-07-26 ENCOUNTER — Telehealth: Payer: Self-pay | Admitting: Oncology

## 2012-07-26 DIAGNOSIS — E559 Vitamin D deficiency, unspecified: Secondary | ICD-10-CM

## 2012-07-26 NOTE — Telephone Encounter (Signed)
patient's daughter confirmed over the phone the new date and time on 09-12-2012 at 9:00am

## 2012-07-26 NOTE — Telephone Encounter (Signed)
S/w the pt's dtr and she is aware of the bone density appt in sept at the [facility] hospital

## 2012-08-01 ENCOUNTER — Ambulatory Visit (HOSPITAL_COMMUNITY)
Admission: RE | Admit: 2012-08-01 | Discharge: 2012-08-01 | Disposition: A | Discharge status: Home / Self Care | Payer: Medicare Other | Source: Ambulatory Visit | Attending: Oncology | Admitting: Oncology

## 2012-08-01 DIAGNOSIS — Z13820 Encounter for screening for osteoporosis: Secondary | ICD-10-CM | POA: Insufficient documentation

## 2012-08-01 DIAGNOSIS — E559 Vitamin D deficiency, unspecified: Secondary | ICD-10-CM

## 2012-08-01 DIAGNOSIS — Z780 Asymptomatic menopausal state: Secondary | ICD-10-CM | POA: Insufficient documentation

## 2012-08-02 ENCOUNTER — Other Ambulatory Visit: Payer: Medicare Other | Admitting: Lab

## 2012-08-02 ENCOUNTER — Ambulatory Visit: Payer: Medicare Other | Admitting: Oncology

## 2012-09-12 ENCOUNTER — Ambulatory Visit (HOSPITAL_BASED_OUTPATIENT_CLINIC_OR_DEPARTMENT_OTHER): Payer: Medicare Other | Admitting: Oncology

## 2012-09-12 ENCOUNTER — Other Ambulatory Visit (HOSPITAL_BASED_OUTPATIENT_CLINIC_OR_DEPARTMENT_OTHER): Payer: Medicare Other | Admitting: Lab

## 2012-09-12 ENCOUNTER — Telehealth: Payer: Self-pay | Admitting: *Deleted

## 2012-09-12 ENCOUNTER — Other Ambulatory Visit: Payer: Self-pay | Admitting: *Deleted

## 2012-09-12 VITALS — BP 111/71 | HR 87 | Temp 98.2°F | Resp 20 | Ht 62.0 in | Wt 222.6 lb

## 2012-09-12 DIAGNOSIS — C50919 Malignant neoplasm of unspecified site of unspecified female breast: Secondary | ICD-10-CM

## 2012-09-12 DIAGNOSIS — E559 Vitamin D deficiency, unspecified: Secondary | ICD-10-CM

## 2012-09-12 DIAGNOSIS — Z853 Personal history of malignant neoplasm of breast: Secondary | ICD-10-CM

## 2012-09-12 DIAGNOSIS — Z170 Estrogen receptor positive status [ER+]: Secondary | ICD-10-CM

## 2012-09-12 DIAGNOSIS — M810 Age-related osteoporosis without current pathological fracture: Secondary | ICD-10-CM

## 2012-09-12 LAB — CBC WITH DIFFERENTIAL/PLATELET
BASO%: 0.1 % (ref 0.0–2.0)
Basophils Absolute: 0 10*3/uL (ref 0.0–0.1)
EOS%: 0.7 % (ref 0.0–7.0)
HCT: 39.8 % (ref 34.8–46.6)
HGB: 13.3 g/dL (ref 11.6–15.9)
MONO#: 0.4 10*3/uL (ref 0.1–0.9)
NEUT%: 71 % (ref 38.4–76.8)
RDW: 13.2 % (ref 11.2–14.5)
WBC: 7 10*3/uL (ref 3.9–10.3)
lymph#: 1.6 10*3/uL (ref 0.9–3.3)

## 2012-09-12 LAB — COMPREHENSIVE METABOLIC PANEL (CC13)
AST: 19 U/L (ref 5–34)
Albumin: 4 g/dL (ref 3.5–5.0)
Alkaline Phosphatase: 78 U/L (ref 40–150)
BUN: 24 mg/dL (ref 7.0–26.0)
Calcium: 10.8 mg/dL — ABNORMAL HIGH (ref 8.4–10.4)
Chloride: 100 mEq/L (ref 98–107)
Potassium: 4.8 mEq/L (ref 3.5–5.1)
Sodium: 136 mEq/L (ref 136–145)
Total Protein: 7.2 g/dL (ref 6.4–8.3)

## 2012-09-12 MED ORDER — ANASTROZOLE 1 MG PO TABS: 1.0000 mg | ORAL_TABLET | Freq: Every day | ORAL | Status: DC

## 2012-09-12 MED ORDER — ALENDRONATE SODIUM 70 MG PO TABS: 70.0000 mg | ORAL_TABLET | ORAL | Status: DC

## 2012-09-12 NOTE — Patient Instructions (Addendum)
Vitamin d3 2000 units daily

## 2012-09-12 NOTE — Progress Notes (Signed)
Hematology and Oncology Follow Up Visit  Christy [name] [id number] 1940/11/09 71 y.o. 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   Interim History:  Patient returns for followup here with her daughter. She is doing well. She remains on the same medication as before. She denies on Arimidex well. She had bone density test and mammogram done in September of this year. Mammogram did not reveal any evidence of recurrence. A bone density test done that showed evidence of osteopenia and osteoporosis though somewhat improved compared to the the study done in 2011. Of note is that she's not taking Fosamax. She does not take vitamin D as well.  Medications: I have reviewed the patient's current medications.  Allergies: No Known Allergies  Past Medical History, Surgical history, Social history, and Family History were reviewed and updated.  Review of Systems: Constitutional:  Negative for fever, chills, night sweats, anorexia, weight loss, pain. Cardiovascular: no chest pain or dyspnea on exertion Respiratory: negative Neurological: negative Dermatological: negative ENT: negative Skin Gastrointestinal: negative Genito-Urinary: negative Hematological and Lymphatic: negative Breast: negative Musculoskeletal: negative Remaining ROS negative.  Physical Exam: Ambulates with some assistance in a 4 prong cane Blood pressure 111/71, pulse 87, temperature 98.2 F (36.8 C), resp. rate 20, height 5\' 2"  (1.575 m), weight 222 lb 9.6 oz (100.971 kg). ECOG: 1 General appearance: alert, cooperative and appears stated age Head: Normocephalic, without obvious abnormality, atraumatic Neck: no adenopathy, no carotid bruit, no JVD, supple, symmetrical, trachea midline and thyroid  not enlarged, symmetric, no tenderness/mass/nodules Lymph nodes: Cervical, supraclavicular, and axillary nodes normal. Cardiac : regular rate and rhythm, no murmurs or gallops Pulmonary:clear to auscultation bilaterally and normal percussion bilaterally Breasts: inspection negative, no nipple discharge or bleeding, no masses or nodularity palpable Abdomen:soft, non-tender; bowel sounds normal; no masses,  no organomegaly Extremities negative Neuro: alert, oriented, normal speech, no focal findings or movement disorder noted  Lab Results: Lab Results  Component Value Date   WBC 7.0 09/12/2012   HGB 13.3 09/12/2012   HCT 39.8 09/12/2012   MCV 91.3 09/12/2012   PLT 189 09/12/2012     Chemistry      Component Value Date/Time   NA 136 09/12/2012 0914   NA 138 01/22/2012 0841   K 4.8 09/12/2012 0914   K 4.6 01/22/2012 0841   CL 100 09/12/2012 0914   CL 99 01/22/2012 0841   CO2 26 09/12/2012 0914   CO2 27 01/22/2012 0841   BUN 24.0 09/12/2012 0914   BUN 12 01/22/2012 0841   CREATININE 1.5* 09/12/2012 0914   CREATININE 0.94 01/22/2012 0841      Component Value Date/Time   CALCIUM 10.8* 09/12/2012 0914   CALCIUM 10.3 01/22/2012 0841   ALKPHOS 78 09/12/2012 0914   ALKPHOS 76 01/22/2012 0841   AST 19 09/12/2012 0914   AST 18 01/22/2012 0841   ALT 32 09/12/2012 0914   ALT 23 01/22/2012 0841   BILITOT 0.53 09/12/2012 0914   BILITOT 0.5 01/22/2012 0841      .pathology. Radiological Studies: chest X-ray n/a Mammogram  Bone density   Impression and Plan: Hx N+ er/pr+ breast cancer s/p CMF and xrt on arimidex, with no evidence of recurrence,  History of osteopenia osteoporosis. We discussed using both vitamin D supplementation as well as starting on Fosamax. I sent both prescriptions  the pharmacy. We have renewed her Arimidex. I will see her back in followup in 6 months time.    More than 50% of the visit was spent in patient-related counselling   Pierce Crane,  MD 11/11/201310:32 AM

## 2012-09-12 NOTE — Telephone Encounter (Signed)
Gave patient appointment for 03-21-2013 starting at 9:30am

## 2013-01-21 ENCOUNTER — Encounter: Payer: Self-pay | Admitting: Oncology

## 2013-01-21 ENCOUNTER — Telehealth: Payer: Self-pay | Admitting: Oncology

## 2013-01-21 NOTE — Telephone Encounter (Signed)
Former PT pt reassigned to [employer]. S/w pt dtr today re d/t/provider. Also per dtr pt does not speak English and usually it' hard to find an interpreter so she will accompany pt to appt. Per dtr pt speaks [place] and the [place] interpreter that was sent could not translate. Language update and dtr aware if a [place] interpreter is available one will be sent for appt.

## 2013-02-07 ENCOUNTER — Other Ambulatory Visit: Payer: Self-pay | Admitting: Oncology

## 2013-03-21 ENCOUNTER — Ambulatory Visit: Payer: Medicare Other | Admitting: Oncology

## 2013-03-21 ENCOUNTER — Encounter: Payer: Self-pay | Admitting: Family

## 2013-03-21 ENCOUNTER — Other Ambulatory Visit: Payer: Medicare Other | Admitting: Lab

## 2013-03-21 ENCOUNTER — Other Ambulatory Visit (HOSPITAL_BASED_OUTPATIENT_CLINIC_OR_DEPARTMENT_OTHER): Payer: Medicare Other | Admitting: Lab

## 2013-03-21 ENCOUNTER — Ambulatory Visit (HOSPITAL_BASED_OUTPATIENT_CLINIC_OR_DEPARTMENT_OTHER): Payer: Medicare Other | Admitting: Family

## 2013-03-21 VITALS — BP 122/77 | HR 74 | Temp 98.3°F | Resp 20 | Ht 62.0 in | Wt 218.3 lb

## 2013-03-21 DIAGNOSIS — Z853 Personal history of malignant neoplasm of breast: Secondary | ICD-10-CM

## 2013-03-21 DIAGNOSIS — C773 Secondary and unspecified malignant neoplasm of axilla and upper limb lymph nodes: Secondary | ICD-10-CM

## 2013-03-21 DIAGNOSIS — E559 Vitamin D deficiency, unspecified: Secondary | ICD-10-CM

## 2013-03-21 DIAGNOSIS — Z170 Estrogen receptor positive status [ER+]: Secondary | ICD-10-CM

## 2013-03-21 DIAGNOSIS — M810 Age-related osteoporosis without current pathological fracture: Secondary | ICD-10-CM

## 2013-03-21 DIAGNOSIS — E119 Type 2 diabetes mellitus without complications: Secondary | ICD-10-CM

## 2013-03-21 DIAGNOSIS — C50911 Malignant neoplasm of unspecified site of right female breast: Secondary | ICD-10-CM

## 2013-03-21 DIAGNOSIS — C50919 Malignant neoplasm of unspecified site of unspecified female breast: Secondary | ICD-10-CM

## 2013-03-21 LAB — COMPREHENSIVE METABOLIC PANEL (CC13)
BUN: 26.5 mg/dL — ABNORMAL HIGH (ref 7.0–26.0)
CO2: 26 mEq/L (ref 22–29)
Calcium: 10.5 mg/dL — ABNORMAL HIGH (ref 8.4–10.4)
Chloride: 103 mEq/L (ref 98–107)
Creatinine: 1.2 mg/dL — ABNORMAL HIGH (ref 0.6–1.1)

## 2013-03-21 LAB — CBC WITH DIFFERENTIAL/PLATELET
Basophils Absolute: 0 10*3/uL (ref 0.0–0.1)
Eosinophils Absolute: 0 10*3/uL (ref 0.0–0.5)
HCT: 39.4 % (ref 34.8–46.6)
LYMPH%: 22.8 % (ref 14.0–49.7)
MONO#: 0.6 10*3/uL (ref 0.1–0.9)
NEUT#: 5.4 10*3/uL (ref 1.5–6.5)
NEUT%: 69.1 % (ref 38.4–76.8)
Platelets: 190 10*3/uL (ref 145–400)

## 2013-03-21 MED ORDER — ANASTROZOLE 1 MG PO TABS: 1.0000 mg | ORAL_TABLET | Freq: Every day | ORAL | Status: DC

## 2013-03-21 MED ORDER — ALENDRONATE SODIUM 70 MG PO TABS: 70.0000 mg | ORAL_TABLET | ORAL | Status: DC

## 2013-03-21 NOTE — Patient Instructions (Addendum)
Please contact us at [phone] if you have any questions or concerns.  Please continue to do well and enjoy life!!!  Get plenty of rest, drink plenty of water, exercise daily (walking 15-30 minutes), eat a balanced diet.  Take vitamin D3 2000 IUs daily.

## 2013-03-21 NOTE — Progress Notes (Signed)
[facility]  [phone] [phone]  OFFICE PROGRESS NOTE   ID: Christy [name]   DOB: 13-Sep-1941  MR#: [id number]  JYN#:[id number]   PCP: Lerry Liner, MD SU: Emelia Loron, M.D. RAD ONC: Radene Gunning., M.D.   HISTORY OF PRESENT ILLNESS: From Dr. Renelda Loma new patient evaluation note dated 04/29/2010: "This is a pleasant 72 year old woman here today with an interpreter, as she speaks [place], for discussion of recent diagnosis of breast cancer.  This patient has not had a recent mammogram.  Circumstances of her recent detection of breast cancer are a little unclear.  It would appear that she may have self detected a mass in her right breast.  She had a diagnostic screening mammogram on 03/28/2010, which showed a possible mass in the right breast with calcifications.  Diagnostic right mammogram was also performed.  A biopsy was performed on 04/04/2010.  Pathology indicated an invasive ductal carcinoma, tumor necrosis with lymphatic invasion was present.  ER and PR positive at 100% and 8% respectively, proliferative index 37%.  HER-2 was not amplified at a ratio of 1.22.  The patient has been seen by Dr. Dwain Sarna, who in turn has referred her for possible neoadjuvant therapy.  She has also been seen by Dr. Mitzi Hansen.  An ultrasound was performed of the mass, which was felt to be 4.4 x 1.2 x 1.6 cm.  On physical examination this is difficult to detect.  An MRI scan performed 04/11/2010 showed a mass measuring 2.8 x 1.6 x 1.7 cm in the right breast, upper outer quadrant.  The patient has been seen in consultation by Dr. Mitzi Hansen for consideration of possible radiation.  She was felt to be a good candidate for radiation therapy."  Her subsequent history is as detailed below.  INTERVAL HISTORY: Dr. Darnelle Catalan and I saw Christy [name] today for follow up of.  The patient is accompanied today by her daughter and interpreter Denyse Dago.  The patient was last seen by Dr. Donnie Coffin on  09/12/2012.  Since her last office visit, the patient has been doing relatively well.  She is establishing herself with Dr. Darrall Dears service today.  REVIEW OF SYSTEMS: A 10 point review of systems was completed and is negative.  The patient and her daughter deny any complaints or current symptomatology.   PAST MEDICAL HISTORY: Past Medical History  Diagnosis Date  . Hyperlipidemia   . Hypertension   . Diabetes mellitus   . Cancer     stage II right breast cancer  . Abnormal ECG     Inferior Q waves  . Cataract   . Hypercholesterolemia   . Osteoporosis   . Overactive bladder     PAST SURGICAL HISTORY: Past Surgical History  Procedure Laterality Date  . Breast surgery      right lumpectomy, sentinel node biopsy  . Portacath placement    . Joint replacement      right hip  . Fracture surgery      ORIF right distal radius fx  . Port-a-cath removal  09/11/2011    Procedure: REMOVAL PORT-A-CATH;  Surgeon: Emelia Loron, MD;  Location: [facility];  Service: General;  Laterality: Left;  local   . Cataract extraction Bilateral 2008    FAMILY HISTORY Family History  Problem Relation Age of Onset  . Hypertension Sister   Both parents deceased, unclear causes.  One sister, three brothers.  All live in [place].   GYNECOLOGIC HISTORY: G3, P3.  Menopause at age 3.  No history of hormone replacement therapy.     SOCIAL HISTORY: The patient is a widow who has an adult son that lives with her.  Ms. Demmon was married for 13 years.    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 She has been in the [place] for over 10 years.  She has 3 children here, one born in 26, 7, and 43.  Two are married.  She has 3 grandchildren.  In her spare time she enjoys watching TV, going with her friends out for coffee, and attending church.   ADVANCED DIRECTIVES: Not on file  HEALTH  MAINTENANCE: History  Substance Use Topics  . Smoking status: Former Games developer  . Smokeless tobacco: Never Used     Comment: quit 2009  . Alcohol Use: No    Colonoscopy: Not on file PAP: Not on file Bone density: The patient's last bone density scan on 08/01/2012 showed a T score of -3.2 (osteoporosis). Lipid panel: Not on file  No Known Allergies  Current Outpatient Prescriptions  Medication Sig Dispense Refill  . alendronate (FOSAMAX) 70 MG tablet Take 1 tablet (70 mg total) by mouth every 7 (seven) days. Take with a full glass of water on an empty stomach.  12 tablet  8  . anastrozole (ARIMIDEX) 1 MG tablet Take 1 tablet (1 mg total) by mouth daily.  90 tablet  8  . Calcium Carb-Cholecalciferol (CALCIUM 1000 + D PO) Take 1,000 mg by mouth daily.      . Cholecalciferol (VITAMIN D-3) 1000 UNITS CAPS Take 1 capsule by mouth daily.      . Fenofibrate (TRICOR PO) Take 48 mg by mouth daily.       . FUROSEMIDE PO Take 40 mg by mouth daily.       [name] ibuprofen (ADVIL,MOTRIN) 800 MG tablet Take 800 mg by mouth every 8 (eight) hours as needed.       [name] LISINOPRIL PO Take 40 mg by mouth daily.       . metFORMIN (GLUCOPHAGE) 500 MG tablet Take 500 mg by mouth 2 (two) times daily with a meal.       . NORVASC 5 MG tablet Take 5 mg by mouth daily.       [name] NOVOLIN R RELION 100 UNIT/ML injection Inject 30 Units into the skin 2 (two) times daily before a meal.       . simvastatin (ZOCOR) 40 MG tablet Take 40 mg by mouth at bedtime.       . VESICARE 10 MG tablet Take 10 mg by mouth daily.        No current facility-administered medications for this visit.    OBJECTIVE: Filed Vitals:   03/21/13 1027  BP: 122/77  Pulse: 74  Temp: 98.3 F (36.8 C)  Resp: 20     Body mass index is 39.92 kg/(m^2).      ECOG FS: 0 - Asymptomatic  General appearance: Alert, cooperative, well nourished, no apparent distress Head: Normocephalic, without obvious abnormality, atraumatic, facial hair, no  dentition Eyes: Arcus senilis, PERRLA, EOMI Nose: Nares, septum and mucosa are normal, no drainage or sinus tenderness Neck: No adenopathy, supple, symmetrical, trachea midline, thyroid not enlarged, no tenderness Resp: Clear to auscultation bilaterally Cardio: Regular rate and rhythm, S1, S2 normal, no murmur, click, rub or gallop Breasts: Pendulous bilaterally, right breast has well-healed surgical scars, right breast architectural changes noted, vitiligo/hypopigmentation around the areola of right  breast, no lymphadenopathy, no nipple inversion, no axilla fullness GI: Soft, distended, non-tender, hypoactive bowel sounds, excessive habitus Extremities: Extremities normal, atraumatic, no cyanosis or edema Lymph nodes: Cervical, supraclavicular, and axillary nodes normal Neurologic: Grossly normal, the patient ambulates with the assistance of a 4 pronged cane  LAB RESULTS: Lab Results  Component Value Date   WBC 7.9 03/21/2013   NEUTROABS 5.4 03/21/2013   HGB 13.4 03/21/2013   HCT 39.4 03/21/2013   MCV 92.0 03/21/2013   PLT 190 03/21/2013      Chemistry      Component Value Date/Time   NA 141 03/21/2013 0934   NA 138 01/22/2012 0841   K 4.6 03/21/2013 0934   K 4.6 01/22/2012 0841   CL 103 03/21/2013 0934   CL 99 01/22/2012 0841   CO2 26 03/21/2013 0934   CO2 27 01/22/2012 0841   BUN 26.5* 03/21/2013 0934   BUN 12 01/22/2012 0841   CREATININE 1.2* 03/21/2013 0934   CREATININE 0.94 01/22/2012 0841      Component Value Date/Time   CALCIUM 10.5* 03/21/2013 0934   CALCIUM 10.3 01/22/2012 0841   ALKPHOS 52 03/21/2013 0934   ALKPHOS 76 01/22/2012 0841   AST 14 03/21/2013 0934   AST 18 01/22/2012 0841   ALT 22 03/21/2013 0934   ALT 23 01/22/2012 0841   BILITOT 0.47 03/21/2013 0934   BILITOT 0.5 01/22/2012 0841       Lab Results  Component Value Date   LABCA2 25 01/22/2012    Urinalysis    Component Value Date/Time   COLORURINE YELLOW 06/11/2009 2128   APPEARANCEUR CLEAR 06/11/2009 2128    LABSPEC 1.028 06/11/2009 2128   PHURINE 6.5 06/11/2009 2128   GLUCOSEU >1000* 06/11/2009 2128   HGBUR NEGATIVE 06/11/2009 2128   BILIRUBINUR NEGATIVE 06/11/2009 2128   KETONESUR 15* 06/11/2009 2128   PROTEINUR NEGATIVE 06/11/2009 2128   UROBILINOGEN 1.0 06/11/2009 2128   NITRITE NEGATIVE 06/11/2009 2128   LEUKOCYTESUR NEGATIVE 06/11/2009 2128    STUDIES: No results found.  ASSESSMENT: 72 y.o. [place], [place]: 1.  Status post right breast needle core biopsy on 04/04/2010 which showed invasive ductal carcinoma with adjacent malignant calcifications, ER 100%, PR 80%, Ki-67 32%, HER-2/neu by CISH no amplification with a ratio of 1.22.  2.  Status post bilateral breast MRI on 04/11/2010 which showed a solitary mass with associated linear ductal distribution enhancement in the middle third of the outer right breast that measured 3.8 x 1.3 x 1.7 cm and corresponded to biopsied invasive ductal carcinoma. No evidence of malignancy is identified in the left breast.  No enlarged axillary or internal mammary chain lymph nodes are identified.  3.  Status post right breast lumpectomy with right axillary sentinel node biopsy on 05/15/2010 for a stage IIA, pT1c pN1a, 2.0 cm invasive ductal carcinoma, grade 3, with extensive lymphovascular invasion identified, ductal carcinoma in situ high-grade, skin with focal ulceration and inflammation, and perinodal lymphatic invasion identified in the right axillary lymph node, ER 100%, PR 80%, Ki-67 37%, HER-2/neu by CISH no amplification with a ratio of 1.16, with 2/6 metastatic lymph nodes.  4.  Oncotype DX report dated 06/03/2010 showed a breast cancer recurrence score of 43 with an average rate of distant recurrence of 30%.  5.   Status post breast lumpectomy right posterior margin reexcision on 08/31/201.  6.  Status post adjuvant chemotherapy with CMF x 6 cycles from 07/30/2010 through 11/12/2010.  7.  Status post radiation therapy from 12/11/2010  through 01/26/2011.  8.  The patient started antiestrogen therapy with Arimidex in 01/2011.  9.  The patient's last bone density scan on 08/01/2012 showed a T score of -3.2 (osteoporosis).   PLAN: The patient will continue antiestrogen therapy with Arimidex 1 mg by mouth daily.  An electronic prescription was sent to the patient's pharmacy for Arimidex #90 with 8 refills.  The patient is scheduled to continue antiestrogen therapy until 01/2016.  The patient's last bilateral digital diagnostic mammogram on 07/25/2012 showed no mammographic evidence of malignancy.  The patient's next bilateral digital diagnostic mammogram is due in 07/2013 and we will schedule this for her.  The patient is currently taking Fosamax 70 mg by mouth every 7 days in addition to calcium 1000 mg daily and vitamin D3 1000 IUs daily for osteoporosis.  The patient was asked to increase her vitamin D intake to 2000 IUs daily.  An electronic prescription for Fosamax #12 with 8 refills was sent to the patient's pharmacy.  The patient was also asked to exercise daily (walking).  Her next bone density scan will be due in 07/2014.  We plan to see the patient again in 08/2013 and then annually in October thereafter.  We will check a CBC, CMP, LDH, and vitamin D level during her next office visit.  All questions were answered.  The patient and her daughter were encouraged to contact us in the interim with any problems, questions or concerns.   Larina Bras, NP-C 03/22/2013, 9:30 AM

## 2013-03-22 ENCOUNTER — Encounter: Payer: Self-pay | Admitting: Family

## 2013-03-22 ENCOUNTER — Telehealth: Payer: Self-pay | Admitting: Oncology

## 2013-07-31 ENCOUNTER — Ambulatory Visit
Admission: RE | Admit: 2013-07-31 | Discharge: 2013-07-31 | Disposition: A | Discharge status: Home / Self Care | Payer: Medicare Other | Source: Ambulatory Visit | Attending: Family | Admitting: Family

## 2013-07-31 DIAGNOSIS — Z853 Personal history of malignant neoplasm of breast: Secondary | ICD-10-CM

## 2013-07-31 DIAGNOSIS — E559 Vitamin D deficiency, unspecified: Secondary | ICD-10-CM

## 2013-07-31 DIAGNOSIS — M810 Age-related osteoporosis without current pathological fracture: Secondary | ICD-10-CM

## 2013-07-31 DIAGNOSIS — C50911 Malignant neoplasm of unspecified site of right female breast: Secondary | ICD-10-CM

## 2013-08-07 ENCOUNTER — Other Ambulatory Visit (HOSPITAL_BASED_OUTPATIENT_CLINIC_OR_DEPARTMENT_OTHER): Payer: Medicare Other

## 2013-08-07 ENCOUNTER — Telehealth: Payer: Self-pay | Admitting: *Deleted

## 2013-08-07 ENCOUNTER — Ambulatory Visit (HOSPITAL_BASED_OUTPATIENT_CLINIC_OR_DEPARTMENT_OTHER): Payer: Medicare Other | Admitting: Oncology

## 2013-08-07 VITALS — BP 103/67 | HR 81 | Temp 98.0°F | Resp 20 | Ht 62.0 in | Wt 219.7 lb

## 2013-08-07 DIAGNOSIS — Z853 Personal history of malignant neoplasm of breast: Secondary | ICD-10-CM

## 2013-08-07 DIAGNOSIS — C50411 Malignant neoplasm of upper-outer quadrant of right female breast: Secondary | ICD-10-CM | POA: Insufficient documentation

## 2013-08-07 DIAGNOSIS — M810 Age-related osteoporosis without current pathological fracture: Secondary | ICD-10-CM

## 2013-08-07 DIAGNOSIS — C50911 Malignant neoplasm of unspecified site of right female breast: Secondary | ICD-10-CM

## 2013-08-07 DIAGNOSIS — C50919 Malignant neoplasm of unspecified site of unspecified female breast: Secondary | ICD-10-CM

## 2013-08-07 DIAGNOSIS — C50419 Malignant neoplasm of upper-outer quadrant of unspecified female breast: Secondary | ICD-10-CM

## 2013-08-07 DIAGNOSIS — E559 Vitamin D deficiency, unspecified: Secondary | ICD-10-CM

## 2013-08-07 DIAGNOSIS — Z170 Estrogen receptor positive status [ER+]: Secondary | ICD-10-CM

## 2013-08-07 LAB — COMPREHENSIVE METABOLIC PANEL (CC13)
ALT: 29 U/L (ref 0–55)
Albumin: 3.9 g/dL (ref 3.5–5.0)
Alkaline Phosphatase: 55 U/L (ref 40–150)
Glucose: 380 mg/dl — ABNORMAL HIGH (ref 70–140)
Potassium: 4.8 mEq/L (ref 3.5–5.1)
Sodium: 138 mEq/L (ref 136–145)
Total Protein: 7.7 g/dL (ref 6.4–8.3)

## 2013-08-07 LAB — CBC WITH DIFFERENTIAL/PLATELET
BASO%: 0.8 % (ref 0.0–2.0)
Basophils Absolute: 0.1 10*3/uL (ref 0.0–0.1)
HCT: 40.7 % (ref 34.8–46.6)
HGB: 13.7 g/dL (ref 11.6–15.9)
MCHC: 33.7 g/dL (ref 31.5–36.0)
MONO#: 0.5 10*3/uL (ref 0.1–0.9)
NEUT%: 72.2 % (ref 38.4–76.8)
WBC: 8.5 10*3/uL (ref 3.9–10.3)
lymph#: 1.8 10*3/uL (ref 0.9–3.3)

## 2013-08-07 LAB — LACTATE DEHYDROGENASE (CC13): LDH: 172 U/L (ref 125–245)

## 2013-08-07 MED ORDER — ALENDRONATE SODIUM 70 MG PO TABS: 70.0000 mg | ORAL_TABLET | ORAL | Status: DC

## 2013-08-07 MED ORDER — ANASTROZOLE 1 MG PO TABS: 1.0000 mg | ORAL_TABLET | Freq: Every day | ORAL | Status: DC

## 2013-08-07 NOTE — Telephone Encounter (Signed)
appts made and printed...td 

## 2013-08-07 NOTE — Progress Notes (Signed)
[facility]  [phone] [phone]  OFFICE PROGRESS NOTE   ID: Christy [name]   DOB: 09/15/1941  MR#: [id number]  JYN#:[id number]  Christy Parker, MD  PCP: Christy Liner, MD SU: Christy [name], M.D. RAD ONC: Christy [name]., M.D.   HISTORY OF PRESENT ILLNESS: From Dr. Renelda [name] new patient evaluation note dated 04/29/2010: "This is a pleasant 72 year old woman here today with an interpreter, as she speaks [place], for discussion of recent diagnosis of breast cancer.  This patient has not had a recent mammogram.  Circumstances of her recent detection of breast cancer are a little unclear.  It would appear that she may have self detected a mass in her right breast.  She had a diagnostic screening mammogram on 03/28/2010, which showed a possible mass in the right breast with calcifications.  Diagnostic right mammogram was also performed.  A biopsy was performed on 04/04/2010.  Pathology indicated an invasive ductal carcinoma, tumor necrosis with lymphatic invasion was present.  ER and PR positive at 100% and 8% respectively, proliferative index 37%.  HER-2 was not amplified at a ratio of 1.22.  The patient has been seen by Dr. Dwain [name], who in turn has referred her for possible neoadjuvant therapy.  She has also been seen by Dr. Mitzi [name].  An ultrasound was performed of the mass, which was felt to be 4.4 x 1.2 x 1.6 cm.  On physical examination this is difficult to detect.  An MRI scan performed 04/11/2010 showed a mass measuring 2.8 x 1.6 x 1.7 cm in the right breast, upper outer quadrant.  The patient has been seen in consultation by Dr. Mitzi [name] for consideration of possible radiation.  She was felt to be a good candidate for radiation therapy."  Her subsequent history is as detailed below.  INTERVAL HISTORY: Christy [name] returns today with her daughter for followup of Christy [name]'s breast cancer. The interval history is unremarkable. According to the daughter, the patient wakes  up, as breakfast, and walks to a neighbor's house and has coffee, comes by:, Watches TV, and cooks some meals. She does minimal housework. She doesn't exercise regularly.[name]  REVIEW OF SYSTEMS: The patient was specifically asked regarding vaginal dryness and hot flashes and denies both. She is not aware of any side effects related to the anastrozole. We also discussed at length how to take the alendronate. She is having no reflux problems. A detailed review of systems today was stable  PAST MEDICAL HISTORY: Past Medical History  Diagnosis Date  . Hyperlipidemia   . Hypertension   . Diabetes mellitus   . Cancer     stage II right breast cancer  . Abnormal ECG     Inferior Q waves  . Cataract   . Hypercholesterolemia   . Osteoporosis   . Overactive bladder     PAST SURGICAL HISTORY: Past Surgical History  Procedure Laterality Date  . Breast surgery      right lumpectomy, sentinel node biopsy  . Portacath placement    . Joint replacement      right hip  . Fracture surgery      ORIF right distal radius fx  . Port-a-cath removal  09/11/2011    Procedure: REMOVAL PORT-A-CATH;  Surgeon: Christy Loron, MD;  Location: [facility];  Service: General;  Laterality: Left;  local   . Cataract extraction Bilateral 2008    FAMILY HISTORY Family History  Problem Relation Age of Onset  . Hypertension Sister   Both parents deceased, unclear  causes.  One sister, three brothers.  All live in [place].   GYNECOLOGIC HISTORY: G3, P3.  Menopause at age 45.  No history of hormone replacement therapy.     SOCIAL HISTORY: The patient is a widow who has an adult son that lives with her.  Christy [name] was married for 13 years.    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 She has been in the [place] for over 10 years.  She has 3 children here, one born in 21, 33, and 74.  Two are married.  She has 3  grandchildren.  In her spare time she enjoys watching TV, going with her friends out for coffee, and attending church.   ADVANCED DIRECTIVES: Not on file  HEALTH MAINTENANCE: History  Substance Use Topics  . Smoking status: Former Games developer  . Smokeless tobacco: Never Used     Comment: quit 2009  . Alcohol Use: No    Colonoscopy: Not on file PAP: Not on file Bone density: The patient's last bone density scan on 08/01/2012 showed a T score of -3.2 (osteoporosis). Lipid panel: Not on file  No Known Allergies  Current Outpatient Prescriptions  Medication Sig Dispense Refill  . alendronate (FOSAMAX) 70 MG tablet Take 1 tablet (70 mg total) by mouth every 7 (seven) days. Take with a full glass of water on an empty stomach.  12 tablet  8  . anastrozole (ARIMIDEX) 1 MG tablet Take 1 tablet (1 mg total) by mouth daily.  90 tablet  8  . Calcium Carb-Cholecalciferol (CALCIUM 1000 + D PO) Take 1,000 mg by mouth daily.      . Cholecalciferol (VITAMIN D-3) 1000 UNITS CAPS Take 1 capsule by mouth daily.      . Fenofibrate (TRICOR PO) Take 48 mg by mouth daily.       . FUROSEMIDE PO Take 40 mg by mouth daily.       [name] ibuprofen (ADVIL,MOTRIN) 800 MG tablet Take 800 mg by mouth every 8 (eight) hours as needed.       [name] LISINOPRIL PO Take 40 mg by mouth daily.       . metFORMIN (GLUCOPHAGE) 500 MG tablet Take 500 mg by mouth 2 (two) times daily with a meal.       . NORVASC 5 MG tablet Take 5 mg by mouth daily.       [name] NOVOLIN R RELION 100 UNIT/ML injection Inject 30 Units into the skin 2 (two) times daily before a meal.       . simvastatin (ZOCOR) 40 MG tablet Take 40 mg by mouth at bedtime.       . VESICARE 10 MG tablet Take 10 mg by mouth daily.        No current facility-administered medications for this visit.    OBJECTIVE: Older white woman who walks with a cane and had difficulty getting onto the examination table Filed Vitals:   08/07/13 0945  BP: 103/67  Pulse: 81  Temp: 98 F (36.7 C)   Resp: 20     Body mass index is 40.17 kg/(m^2).      ECOG FS: 1 - Symptomatic but completely ambulatory  Sclerae unicteric Oropharynx clear No cervical or supraclavicular adenopathy Lungs no rales or rhonchi Heart regular rate and rhythm Abd obese, benign MSK no focal spinal tenderness Neuro: nonfocal, friendly affect Breasts: The right breast is status post lumpectomy and radiation. There is no evidence  of local recurrence. The right axilla is benign. The left breast is unremarkable    LAB RESULTS: Lab Results  Component Value Date   WBC 8.5 08/07/2013   NEUTROABS 6.1 08/07/2013   HGB 13.7 08/07/2013   HCT 40.7 08/07/2013   MCV 91.8 08/07/2013   PLT 203 08/07/2013      Chemistry      Component Value Date/Time   NA 141 03/21/2013 0934   NA 138 01/22/2012 0841   K 4.6 03/21/2013 0934   K 4.6 01/22/2012 0841   CL 103 03/21/2013 0934   CL 99 01/22/2012 0841   CO2 26 03/21/2013 0934   CO2 27 01/22/2012 0841   BUN 26.5* 03/21/2013 0934   BUN 12 01/22/2012 0841   CREATININE 1.2* 03/21/2013 0934   CREATININE 0.94 01/22/2012 0841      Component Value Date/Time   CALCIUM 10.5* 03/21/2013 0934   CALCIUM 10.3 01/22/2012 0841   ALKPHOS 52 03/21/2013 0934   ALKPHOS 76 01/22/2012 0841   AST 14 03/21/2013 0934   AST 18 01/22/2012 0841   ALT 22 03/21/2013 0934   ALT 23 01/22/2012 0841   BILITOT 0.47 03/21/2013 0934   BILITOT 0.5 01/22/2012 0841       Lab Results  Component Value Date   LABCA2 25 01/22/2012    Urinalysis    Component Value Date/Time   COLORURINE YELLOW 06/11/2009 2128   APPEARANCEUR CLEAR 06/11/2009 2128   LABSPEC 1.028 06/11/2009 2128   PHURINE 6.5 06/11/2009 2128   GLUCOSEU >1000* 06/11/2009 2128   HGBUR NEGATIVE 06/11/2009 2128   BILIRUBINUR NEGATIVE 06/11/2009 2128   KETONESUR 15* 06/11/2009 2128   PROTEINUR NEGATIVE 06/11/2009 2128   UROBILINOGEN 1.0 06/11/2009 2128   NITRITE NEGATIVE 06/11/2009 2128   LEUKOCYTESUR NEGATIVE 06/11/2009 2128    STUDIES: Mm Digital  Diagnostic Bilat  07/31/2013   *RADIOLOGY REPORT*  Clinical Data:  Patient presents for a bilateral diagnostic mammogram due to a history of a prior malignant right lumpectomy 2011.  DIGITAL DIAGNOSTIC BILATERAL MAMMOGRAM WITH CAD  Comparison: 07/25/2012, 07/08/2011, 03/28/2010  Findings:  ACR Breast Density Category b:  There are scattered areas of fibroglandular density.  Exam demonstrates stable post lumpectomy changes of the upper outer right breast.  Remainder of the exam is unchanged.  Mammographic images were processed with CAD.  IMPRESSION: Stable post lumpectomy changes of the upper outer right breast.  RECOMMENDATION: Recommend continued annual bilateral diagnostic mammographic evaluation.  I have discussed the findings and recommendations with the patient. Results were also provided in writing at the conclusion of the visit.  If applicable, a reminder letter will be sent to the patient regarding her next appointment.  BI-RADS CATEGORY 2:  Benign finding(s).   Original Report Authenticated By: Elberta Fortis, M.D.    ASSESSMENT: 72 y.o. [place] woman:  1.  Status post  right breast upper outer quadrant lumpectomy with right axillary sentinel node biopsy on 05/15/2010 for a pT1c pN1a, stage IIA, invasive ductal carcinoma, grade 3, estrogen receptor 100%, progesterone receptor 80%, MIB-1 of 37%, HER-2/neu by CISH no amplification; margins negative but close  2.  Oncotype DX report dated 06/03/2010 showed a breast cancer recurrence score of 43 with an average rate of distant recurrence of 30% if the patient's only systemic treatment was tamoxifen for 5 years.  3.   Status post breast lumpectomy for right posterior margin reexcision on 08/31/201, with no residual cancer noted  6.  Status post adjuvant chemotherapy with cyclophosphamide, methotrexate and fluorouracil ("  CMF") x 6 cycles from 07/30/2010 through 11/12/2010.  7.  Status post adjuvant radiation therapy from 12/11/2010 through  01/26/2011.  8.  The patient started antiestrogen therapy with Arimidex in 01/2011.  9.  osteoporosis, with bone density scan on 08/01/2012 showing a T score of -3.2    PLAN: Trystan appears to be tolerating the anastrozole with no significant side effects. She also reports no problems from the alendronate. I tried to find out as she was taking the alendronate following appropriate instructions and as far as I could tell she was not. Accordingly I reviewed those with her daughter and gave her written instructions on taking the alendronate on an empty stomach and remaining vertical 45 minutes after ingestion, and doing this once a week.  I informed the patient and her daughter that she would qualify for the "Silver sneakers" program, which is 3, and which would help get her out of the house and more active.  The patient is going to see [place] again in one year. At that time together with her mammogram in September she will have a repeat bone density scan. Hopefully this will show improvement. The overall plan is to continue anastrozole for a total of 5 years, which is going to take [place] to March of 2017. She will remain on anastrozole during that period of time as well. Lowella Dell, MD  08/07/2013, 10:24 AM

## 2013-11-27 ENCOUNTER — Encounter (HOSPITAL_COMMUNITY): Payer: Self-pay | Admitting: Emergency Medicine

## 2013-11-27 ENCOUNTER — Emergency Department (HOSPITAL_COMMUNITY)
Admission: EM | Admit: 2013-11-27 | Discharge: 2013-11-27 | Disposition: A | Discharge status: Home / Self Care | Payer: Medicare Other | Attending: Emergency Medicine | Admitting: Emergency Medicine

## 2013-11-27 DIAGNOSIS — M810 Age-related osteoporosis without current pathological fracture: Secondary | ICD-10-CM | POA: Insufficient documentation

## 2013-11-27 DIAGNOSIS — I10 Essential (primary) hypertension: Secondary | ICD-10-CM | POA: Insufficient documentation

## 2013-11-27 DIAGNOSIS — E7800 Pure hypercholesterolemia, unspecified: Secondary | ICD-10-CM | POA: Insufficient documentation

## 2013-11-27 DIAGNOSIS — R739 Hyperglycemia, unspecified: Secondary | ICD-10-CM

## 2013-11-27 DIAGNOSIS — Z79899 Other long term (current) drug therapy: Secondary | ICD-10-CM | POA: Insufficient documentation

## 2013-11-27 DIAGNOSIS — Z87448 Personal history of other diseases of urinary system: Secondary | ICD-10-CM | POA: Insufficient documentation

## 2013-11-27 DIAGNOSIS — Z853 Personal history of malignant neoplasm of breast: Secondary | ICD-10-CM | POA: Insufficient documentation

## 2013-11-27 DIAGNOSIS — E119 Type 2 diabetes mellitus without complications: Secondary | ICD-10-CM | POA: Insufficient documentation

## 2013-11-27 DIAGNOSIS — E785 Hyperlipidemia, unspecified: Secondary | ICD-10-CM | POA: Insufficient documentation

## 2013-11-27 DIAGNOSIS — Z794 Long term (current) use of insulin: Secondary | ICD-10-CM | POA: Insufficient documentation

## 2013-11-27 DIAGNOSIS — Z8669 Personal history of other diseases of the nervous system and sense organs: Secondary | ICD-10-CM | POA: Insufficient documentation

## 2013-11-27 DIAGNOSIS — Z87891 Personal history of nicotine dependence: Secondary | ICD-10-CM | POA: Insufficient documentation

## 2013-11-27 LAB — POCT I-STAT 3, ART BLOOD GAS (G3+)
ACID-BASE DEFICIT: 1 mmol/L (ref 0.0–2.0)
Bicarbonate: 24 mEq/L (ref 20.0–24.0)
O2 SAT: 92 %
TCO2: 25 mmol/L (ref 0–100)
pCO2 arterial: 38.7 mmHg (ref 35.0–45.0)
pH, Arterial: 7.4 (ref 7.350–7.450)
pO2, Arterial: 62 mmHg — ABNORMAL LOW (ref 80.0–100.0)

## 2013-11-27 LAB — GLUCOSE, CAPILLARY
GLUCOSE-CAPILLARY: 326 mg/dL — AB (ref 70–99)
Glucose-Capillary: 114 mg/dL — ABNORMAL HIGH (ref 70–99)

## 2013-11-27 LAB — CBC
HCT: 37.6 % (ref 36.0–46.0)
Hemoglobin: 12.7 g/dL (ref 12.0–15.0)
MCH: 31.6 pg (ref 26.0–34.0)
MCHC: 33.8 g/dL (ref 30.0–36.0)
MCV: 93.5 fL (ref 78.0–100.0)
Platelets: 267 10*3/uL (ref 150–400)
RBC: 4.02 MIL/uL (ref 3.87–5.11)
RDW: 13.2 % (ref 11.5–15.5)
WBC: 8.8 10*3/uL (ref 4.0–10.5)

## 2013-11-27 LAB — COMPREHENSIVE METABOLIC PANEL
ALT: 23 U/L (ref 0–35)
AST: 15 U/L (ref 0–37)
Albumin: 3.8 g/dL (ref 3.5–5.2)
Alkaline Phosphatase: 56 U/L (ref 39–117)
BUN: 37 mg/dL — AB (ref 6–23)
CO2: 23 meq/L (ref 19–32)
CREATININE: 1.16 mg/dL — AB (ref 0.50–1.10)
Calcium: 10.1 mg/dL (ref 8.4–10.5)
Chloride: 100 mEq/L (ref 96–112)
GFR calc Af Amer: 53 mL/min — ABNORMAL LOW (ref >90)
GFR, EST NON AFRICAN AMERICAN: 46 mL/min — AB (ref >90)
Glucose, Bld: 336 mg/dL — ABNORMAL HIGH (ref 70–99)
Potassium: 4.3 mEq/L (ref 3.7–5.3)
Sodium: 141 mEq/L (ref 137–147)
Total Bilirubin: <0.2 mg/dL — ABNORMAL LOW (ref 0.3–1.2)
Total Protein: 7.3 g/dL (ref 6.0–8.3)

## 2013-11-27 LAB — URINALYSIS, ROUTINE W REFLEX MICROSCOPIC
Bilirubin Urine: NEGATIVE
Glucose, UA: 250 mg/dL — AB
Hgb urine dipstick: NEGATIVE
Ketones, ur: NEGATIVE mg/dL
LEUKOCYTES UA: NEGATIVE
NITRITE: NEGATIVE
PROTEIN: NEGATIVE mg/dL
Specific Gravity, Urine: 1.017 (ref 1.005–1.030)
UROBILINOGEN UA: 0.2 mg/dL (ref 0.0–1.0)
pH: 5 (ref 5.0–8.0)

## 2013-11-27 NOTE — ED Notes (Signed)
Pt sent here after office visits and continues to have consistent high blood sugars, please read accompanying sheet about insulin adjustments.  Pt denies pain, thirst or increased urination.  Pt Blood sugar 430 today per son

## 2013-11-27 NOTE — ED Provider Notes (Signed)
Medical screening examination/treatment/procedure(s) were conducted as a shared visit with non-physician practitioner(s) and myself.  I personally evaluated the patient during the encounter.  EKG Interpretation   None       73 yo female with longstanding history of diabetes presenting with hyperglycemia.  No associated symptoms.  Advised to come to the ED by PCP's office.  Son gave additional insulin prior to arrival.  On exam, well appearing, no distress, normal respiratory effort, lungs clear, normal perfusion, heart sounds normal, RRR.  As blood sugar has normalized and she has no other concerning symptoms, she is stable for discharge home with further outpatient management.  Her son will give her her BP meds as soon as they get home.   Clinical Impression: 1. Hyperglycemia       [name], MD 11/27/13 2127

## 2013-11-27 NOTE — ED Provider Notes (Signed)
Medical screening examination/treatment/procedure(s) were conducted as a shared visit with non-physician practitioner(s) and myself.  I personally evaluated the patient during the encounter.  EKG Interpretation   None         [name], MD 11/27/13 2332

## 2013-11-27 NOTE — ED Provider Notes (Signed)
CSN: [id number]     Arrival date & time 11/27/13  1449 History   First MD Initiated Contact with Patient 11/27/13 2014     Chief Complaint  Patient presents with  . Hyperglycemia   HPI  History provided by the patient and son. The patient is a 73 year old [place] female with limited English and history is of hypertension, hyperlipidemia, diabetes and breast cancer who presents with concerns for poorly controlled diabetes and elevated blood sugar. Patient had a regularly scheduled checkup with her primary care provider earlier today. She was found to have elevated blood sugars over 400 and was strongly encouraged to come to the emergency room for further evaluation and treatment. Patient's son does state that her provider did plan to adjust some of her medications. He has been able to help her and make sure that she has been taking her daily diabetes medications in insulins as prescribed. He states it is very difficult to control her sugar. Some days it may be in the 100s but will quickly changed to the 300s. He very commonly will be above 500 or greater than their home meter can read. Patient has not had any complaints of symptoms. She does not complain of increased urination or thirst. Denies any recent illnesses, fever, chills or sweats, nausea, vomiting or diarrhea. No numbness or tingling in extremities. No vision changes.    Past Medical History  Diagnosis Date  . Hyperlipidemia   . Hypertension   . Diabetes mellitus   . Cancer     stage II right breast cancer  . Abnormal ECG     Inferior Q waves  . Cataract   . Hypercholesterolemia   . Osteoporosis   . Overactive bladder    Past Surgical History  Procedure Laterality Date  . Breast surgery      right lumpectomy, sentinel node biopsy  . Portacath placement    . Joint replacement      right hip  . Fracture surgery      ORIF right distal radius fx  . Port-a-cath removal  09/11/2011    Procedure: REMOVAL PORT-A-CATH;  Surgeon:  Rolm Bookbinder, MD;  Location: [facility];  Service: General;  Laterality: Left;  local   . Cataract extraction Bilateral 2008   Family History  Problem Relation Age of Onset  . Hypertension Sister    History  Substance Use Topics  . Smoking status: Former Research scientist (life sciences)  . Smokeless tobacco: Never Used     Comment: quit 2009  . Alcohol Use: No   OB History   Grav Para Term Preterm Abortions TAB SAB Ect Mult Living                 Review of Systems  Constitutional: Negative for fever, chills, diaphoresis and appetite change.  Gastrointestinal: Negative for vomiting, abdominal pain and diarrhea.  Endocrine: Negative for polyphagia and polyuria.  All other systems reviewed and are negative.    Allergies  Review of patient's allergies indicates no known allergies.  Home Medications   Current Outpatient Rx  Name  Route  Sig  Dispense  Refill  . alendronate (FOSAMAX) 70 MG tablet   Oral   Take 1 tablet (70 mg total) by mouth every 7 (seven) days. Take with a full glass of water on an empty stomach.   12 tablet   8   . anastrozole (ARIMIDEX) 1 MG tablet   Oral   Take 1 tablet (1 mg total) by mouth daily.  90 tablet   8   . Calcium Carb-Cholecalciferol (CALCIUM 1000 + D PO)   Oral   Take 1 tablet by mouth daily.          . Cholecalciferol (VITAMIN D-3) 1000 UNITS CAPS   Oral   Take 1 capsule by mouth daily.         . fenofibrate (TRICOR) 48 MG tablet   Oral   Take 48 mg by mouth daily.         . furosemide (LASIX) 40 MG tablet   Oral   Take 40 mg by mouth daily.         [name] ibuprofen (ADVIL,MOTRIN) 800 MG tablet   Oral   Take 800 mg by mouth every 8 (eight) hours as needed for fever or moderate pain.          [name] insulin aspart (NOVOLOG) 100 UNIT/ML injection   Subcutaneous   Inject 10-16 Units into the skin 3 (three) times daily before meals. 151-200=10 u; 201-250=12 units; 251-300=14units;301-350=16 units         . insulin detemir  (LEVEMIR) 100 UNIT/ML injection   Subcutaneous   Inject 30 Units into the skin at bedtime.         [name] lisinopril (PRINIVIL,ZESTRIL) 40 MG tablet   Oral   Take 40 mg by mouth daily.         . metFORMIN (GLUCOPHAGE-XR) 500 MG 24 hr tablet   Oral   Take 1,000 mg by mouth 2 (two) times daily.         . NORVASC 5 MG tablet   Oral   Take 5 mg by mouth daily.          . pantoprazole (PROTONIX) 40 MG tablet   Oral   Take 40 mg by mouth daily.         . simvastatin (ZOCOR) 40 MG tablet   Oral   Take 40 mg by mouth daily.         . VESICARE 10 MG tablet   Oral   Take 10 mg by mouth daily.           BP 168/70  Pulse 76  Temp(Src) 98.4 F (36.9 C) (Oral)  Resp 14  Wt 224 lb 6.4 oz (101.787 kg)  SpO2 96% Physical Exam  Nursing note and vitals reviewed. Constitutional: She is oriented to person, place, and time. She appears well-developed and well-nourished. No distress.  HENT:  Head: Normocephalic.  Mouth/Throat: Oropharynx is clear and moist.  Cardiovascular: Normal rate and regular rhythm.   Pulmonary/Chest: Effort normal and breath sounds normal. No respiratory distress. She has no wheezes. She has no rales.  Abdominal: Soft. There is no tenderness. There is no rebound and no guarding.  Neurological: She is alert and oriented to person, place, and time.  Skin: Skin is warm and dry. No rash noted.  Psychiatric: She has a normal mood and affect. Her behavior is normal.    ED Course  Procedures   DIAGNOSTIC STUDIES: Oxygen Saturation is 97% on room air.    COORDINATION OF CARE:  Nursing notes reviewed. Vital signs reviewed. Initial pt interview and examination performed.   8:30 PM patient seen and evaluated. Patient resting comfortably has no complaints at this time. She has very limited [place]. Son does help history. Discussed work up plan with pt at bedside, which includes blood testing and treatment of elevated sugar. Pt agrees with plan.   9:00 PM  patient continues to  be well appearing. Blood sugar has improved somewhat to 326. Anion gap equals 18, CO2 23. Normal arterial pH. No ketones in urine. At this time no clinical concerns for DKA.  9:20 PM patient's blood sugar continued to improve with only small amount of IV fluids. Patient was seen and evaluated with attending physician. At this time she may be discharged home with continued outpatient treatment for her hyperglycemia and diabetes. Patient and son agree with plan.   Results for orders placed during the hospital encounter of 11/27/13  CBC      Result Value Range   WBC 8.8  4.0 - 10.5 K/uL   RBC 4.02  3.87 - 5.11 MIL/uL   Hemoglobin 12.7  12.0 - 15.0 g/dL   HCT 37.6  36.0 - 46.0 %   MCV 93.5  78.0 - 100.0 fL   MCH 31.6  26.0 - 34.0 pg   MCHC 33.8  30.0 - 36.0 g/dL   RDW 13.2  11.5 - 15.5 %   Platelets 267  150 - 400 K/uL  COMPREHENSIVE METABOLIC PANEL      Result Value Range   Sodium 141  137 - 147 mEq/L   Potassium 4.3  3.7 - 5.3 mEq/L   Chloride 100  96 - 112 mEq/L   CO2 23  19 - 32 mEq/L   Glucose, Bld 336 (*) 70 - 99 mg/dL   BUN 37 (*) 6 - 23 mg/dL   Creatinine, Ser 1.16 (*) 0.50 - 1.10 mg/dL   Calcium 10.1  8.4 - 10.5 mg/dL   Total Protein 7.3  6.0 - 8.3 g/dL   Albumin 3.8  3.5 - 5.2 g/dL   AST 15  0 - 37 U/L   ALT 23  0 - 35 U/L   Alkaline Phosphatase 56  39 - 117 U/L   Total Bilirubin <0.2 (*) 0.3 - 1.2 mg/dL   GFR calc non Af Amer 46 (*) >90 mL/min   GFR calc Af Amer 53 (*) >90 mL/min  URINALYSIS, ROUTINE W REFLEX MICROSCOPIC      Result Value Range   Color, Urine YELLOW  YELLOW   APPearance CLEAR  CLEAR   Specific Gravity, Urine 1.017  1.005 - 1.030   pH 5.0  5.0 - 8.0   Glucose, UA 250 (*) NEGATIVE mg/dL   Hgb urine dipstick NEGATIVE  NEGATIVE   Bilirubin Urine NEGATIVE  NEGATIVE   Ketones, ur NEGATIVE  NEGATIVE mg/dL   Protein, ur NEGATIVE  NEGATIVE mg/dL   Urobilinogen, UA 0.2  0.0 - 1.0 mg/dL   Nitrite NEGATIVE  NEGATIVE   Leukocytes, UA  NEGATIVE  NEGATIVE  GLUCOSE, CAPILLARY      Result Value Range   Glucose-Capillary 326 (*) 70 - 99 mg/dL   Comment 1 Notify RN    GLUCOSE, CAPILLARY      Result Value Range   Glucose-Capillary 114 (*) 70 - 99 mg/dL  POCT I-STAT 3, BLOOD GAS (G3+)      Result Value Range   pH, Arterial 7.400  7.350 - 7.450   pCO2 arterial 38.7  35.0 - 45.0 mmHg   pO2, Arterial 62.0 (*) 80.0 - 100.0 mmHg   Bicarbonate 24.0  20.0 - 24.0 mEq/L   TCO2 25  0 - 100 mmol/L   O2 Saturation 92.0     Acid-base deficit 1.0  0.0 - 2.0 mmol/L   Patient temperature 98.4 F     Collection site RADIAL, ALLEN'S TEST ACCEPTABLE  Drawn by RT     Sample type ARTERIAL        MDM   1. Hyperglycemia        Martie Lee, PA-C 11/27/13 2123

## 2013-11-27 NOTE — Discharge Instructions (Signed)
Please continue your medications and continue followup with your primary care provider for continued evaluation and treatment.    Hyperglycemia Hyperglycemia occurs when the glucose (sugar) in your blood is too high. Hyperglycemia can happen for many reasons, but it most often happens to people who do not know they have diabetes or are not managing their diabetes properly.  CAUSES  Whether you have diabetes or not, there are other causes of hyperglycemia. Hyperglycemia can occur when you have diabetes, but it can also occur in other situations that you might not be as aware of, such as: Diabetes  If you have diabetes and are having problems controlling your blood glucose, hyperglycemia could occur because of some of the following reasons:  Not following your meal plan.  Not taking your diabetes medications or not taking it properly.  Exercising less or doing less activity than you normally do.  Being sick. Pre-diabetes  This cannot be ignored. Before people develop Type 2 diabetes, they almost always have "pre-diabetes." This is when your blood glucose levels are higher than normal, but not yet high enough to be diagnosed as diabetes. Research has shown that some long-term damage to the body, especially the heart and circulatory system, may already be occurring during pre-diabetes. If you take action to manage your blood glucose when you have pre-diabetes, you may delay or prevent Type 2 diabetes from developing. Stress  If you have diabetes, you may be "diet" controlled or on oral medications or insulin to control your diabetes. However, you may find that your blood glucose is higher than usual in the hospital whether you have diabetes or not. This is often referred to as "stress hyperglycemia." Stress can elevate your blood glucose. This happens because of hormones put out by the body during times of stress. If stress has been the cause of your high blood glucose, it can be followed  regularly by your caregiver. That way he/she can make sure your hyperglycemia does not continue to get worse or progress to diabetes. Steroids  Steroids are medications that act on the infection fighting system (immune system) to block inflammation or infection. One side effect can be a rise in blood glucose. Most people can produce enough extra insulin to allow for this rise, but for those who cannot, steroids make blood glucose levels go even higher. It is not unusual for steroid treatments to "uncover" diabetes that is developing. It is not always possible to determine if the hyperglycemia will go away after the steroids are stopped. A special blood test called an A1c is sometimes done to determine if your blood glucose was elevated before the steroids were started. SYMPTOMS  Thirsty.  Frequent urination.  Dry mouth.  Blurred vision.  Tired or fatigue.  Weakness.  Sleepy.  Tingling in feet or leg. DIAGNOSIS  Diagnosis is made by monitoring blood glucose in one or all of the following ways:  A1c test. This is a chemical found in your blood.  Fingerstick blood glucose monitoring.  Laboratory results. TREATMENT  First, knowing the cause of the hyperglycemia is important before the hyperglycemia can be treated. Treatment may include, but is not be limited to:  Education.  Change or adjustment in medications.  Change or adjustment in meal plan.  Treatment for an illness, infection, etc.  More frequent blood glucose monitoring.  Change in exercise plan.  Decreasing or stopping steroids.  Lifestyle changes. HOME CARE INSTRUCTIONS   Test your blood glucose as directed.  Exercise regularly. Your caregiver will  give you instructions about exercise. Pre-diabetes or diabetes which comes on with stress is helped by exercising.  Eat wholesome, balanced meals. Eat often and at regular, fixed times. Your caregiver or nutritionist will give you a meal plan to guide your sugar  intake.  Being at an ideal weight is important. If needed, losing as little as 10 to 15 pounds may help improve blood glucose levels. SEEK MEDICAL CARE IF:   You have questions about medicine, activity, or diet.  You continue to have symptoms (problems such as increased thirst, urination, or weight gain). SEEK IMMEDIATE MEDICAL CARE IF:   You are vomiting or have diarrhea.  Your breath smells fruity.  You are breathing faster or slower.  You are very sleepy or incoherent.  You have numbness, tingling, or pain in your feet or hands.  You have chest pain.  Your symptoms get worse even though you have been following your caregiver's orders.  If you have any other questions or concerns. Document Released: 04/14/2001 Document Revised: 01/11/2012 Document Reviewed: 02/15/2012 [facility] Patient Information 2014 [place], [place].

## 2013-11-27 NOTE — ED Notes (Signed)
Dr.Wofford at bedside explaining discharge instructions / discharge plan .

## 2014-07-18 ENCOUNTER — Ambulatory Visit (HOSPITAL_COMMUNITY)
Admission: RE | Admit: 2014-07-18 | Discharge: 2014-07-18 | Disposition: A | Discharge status: Home / Self Care | Payer: Medicare Other | Source: Ambulatory Visit | Attending: Oncology | Admitting: Oncology

## 2014-07-18 DIAGNOSIS — Z13820 Encounter for screening for osteoporosis: Secondary | ICD-10-CM | POA: Diagnosis not present

## 2014-07-18 DIAGNOSIS — Z780 Asymptomatic menopausal state: Secondary | ICD-10-CM | POA: Diagnosis not present

## 2014-07-18 DIAGNOSIS — M810 Age-related osteoporosis without current pathological fracture: Secondary | ICD-10-CM

## 2014-07-18 DIAGNOSIS — C50411 Malignant neoplasm of upper-outer quadrant of right female breast: Secondary | ICD-10-CM

## 2014-07-31 ENCOUNTER — Other Ambulatory Visit (HOSPITAL_BASED_OUTPATIENT_CLINIC_OR_DEPARTMENT_OTHER): Payer: Medicare Other

## 2014-07-31 DIAGNOSIS — C50919 Malignant neoplasm of unspecified site of unspecified female breast: Secondary | ICD-10-CM

## 2014-07-31 DIAGNOSIS — C50411 Malignant neoplasm of upper-outer quadrant of right female breast: Secondary | ICD-10-CM

## 2014-07-31 LAB — CBC WITH DIFFERENTIAL/PLATELET
BASO%: 0 % (ref 0.0–2.0)
Basophils Absolute: 0 10*3/uL (ref 0.0–0.1)
EOS ABS: 0 10*3/uL (ref 0.0–0.5)
EOS%: 0.5 % (ref 0.0–7.0)
HCT: 38.5 % (ref 34.8–46.6)
HGB: 12.7 g/dL (ref 11.6–15.9)
LYMPH%: 19.1 % (ref 14.0–49.7)
MCH: 30.4 pg (ref 25.1–34.0)
MCHC: 33 g/dL (ref 31.5–36.0)
MCV: 92.1 fL (ref 79.5–101.0)
MONO#: 0.6 10*3/uL (ref 0.1–0.9)
MONO%: 6.7 % (ref 0.0–14.0)
NEUT#: 6.3 10*3/uL (ref 1.5–6.5)
NEUT%: 73.7 % (ref 38.4–76.8)
PLATELETS: 182 10*3/uL (ref 145–400)
RBC: 4.18 10*6/uL (ref 3.70–5.45)
RDW: 13.2 % (ref 11.2–14.5)
WBC: 8.5 10*3/uL (ref 3.9–10.3)
lymph#: 1.6 10*3/uL (ref 0.9–3.3)

## 2014-07-31 LAB — COMPREHENSIVE METABOLIC PANEL (CC13)
ALBUMIN: 3.7 g/dL (ref 3.5–5.0)
ALK PHOS: 51 U/L (ref 40–150)
ALT: 28 U/L (ref 0–55)
AST: 21 U/L (ref 5–34)
Anion Gap: 9 mEq/L (ref 3–11)
BILIRUBIN TOTAL: 0.39 mg/dL (ref 0.20–1.20)
BUN: 48.4 mg/dL — AB (ref 7.0–26.0)
CO2: 25 mEq/L (ref 22–29)
Calcium: 10 mg/dL (ref 8.4–10.4)
Chloride: 102 mEq/L (ref 98–109)
Creatinine: 1.8 mg/dL — ABNORMAL HIGH (ref 0.6–1.1)
GLUCOSE: 298 mg/dL — AB (ref 70–140)
POTASSIUM: 5.2 meq/L — AB (ref 3.5–5.1)
SODIUM: 136 meq/L (ref 136–145)
TOTAL PROTEIN: 7.2 g/dL (ref 6.4–8.3)

## 2014-08-07 ENCOUNTER — Other Ambulatory Visit: Payer: Self-pay | Admitting: *Deleted

## 2014-08-07 ENCOUNTER — Encounter: Payer: Self-pay | Admitting: Nurse Practitioner

## 2014-08-07 ENCOUNTER — Ambulatory Visit (HOSPITAL_BASED_OUTPATIENT_CLINIC_OR_DEPARTMENT_OTHER): Payer: Medicare Other | Admitting: Nurse Practitioner

## 2014-08-07 VITALS — BP 139/62 | HR 96 | Temp 98.7°F | Resp 19 | Wt 224.7 lb

## 2014-08-07 DIAGNOSIS — E1165 Type 2 diabetes mellitus with hyperglycemia: Secondary | ICD-10-CM

## 2014-08-07 DIAGNOSIS — C50911 Malignant neoplasm of unspecified site of right female breast: Secondary | ICD-10-CM

## 2014-08-07 DIAGNOSIS — M810 Age-related osteoporosis without current pathological fracture: Secondary | ICD-10-CM

## 2014-08-07 DIAGNOSIS — Z853 Personal history of malignant neoplasm of breast: Secondary | ICD-10-CM

## 2014-08-07 DIAGNOSIS — C50411 Malignant neoplasm of upper-outer quadrant of right female breast: Secondary | ICD-10-CM

## 2014-08-07 DIAGNOSIS — E559 Vitamin D deficiency, unspecified: Secondary | ICD-10-CM

## 2014-08-07 DIAGNOSIS — IMO0002 Reserved for concepts with insufficient information to code with codable children: Secondary | ICD-10-CM

## 2014-08-07 DIAGNOSIS — E119 Type 2 diabetes mellitus without complications: Secondary | ICD-10-CM

## 2014-08-07 DIAGNOSIS — R748 Abnormal levels of other serum enzymes: Secondary | ICD-10-CM

## 2014-08-07 DIAGNOSIS — R7989 Other specified abnormal findings of blood chemistry: Secondary | ICD-10-CM

## 2014-08-07 DIAGNOSIS — R799 Abnormal finding of blood chemistry, unspecified: Secondary | ICD-10-CM

## 2014-08-07 MED ORDER — ANASTROZOLE 1 MG PO TABS: 1.0000 mg | ORAL_TABLET | Freq: Every day | ORAL | Status: DC

## 2014-08-07 NOTE — Progress Notes (Signed)
Christy [name]  [phone] Fax:([phone]) [phone]  OFFICE PROGRESS NOTE   ID: Christy [name]   DOB: 06-Oct-1941  MR#: [id number]  EVO#:[id number]  Pcp Not In System  PCP: Christy Ram, Christy [name] SU: Christy [name], M.D. RAD ONC: Christy Gross., M.D.  CHIEF COMPLAINT: hx right breast cancer CURRENT TREATMENT: anastrozole 31m daily, fosamax  BREAST CANCER HISTORY: From Dr. RJulien Girtnew patient evaluation note dated 04/29/2010: "This is a pleasant [age]year old [name] here today with an interpreter, as she speaks [place] for discussion of recent diagnosis of breast cancer.  This patient has not had a recent mammogram.  Circumstances of her recent detection of breast cancer are a little unclear.  It would appear that she may have self detected a mass in her right breast.  She had a diagnostic screening mammogram on 03/28/2010, which showed a possible mass in the right breast with calcifications.  Diagnostic right mammogram was also performed.  A biopsy was performed on 04/04/2010.  Pathology indicated an invasive ductal carcinoma, tumor necrosis with lymphatic invasion was present.  ER and PR positive at 100% and 8% respectively, proliferative index 37%.  HER-2 was not amplified at a ratio of 1.22.  The patient has been seen by Dr. WDonne [name] who in turn has referred her for possible neoadjuvant therapy.  She has also been seen by Dr. MLisbeth [name]  An ultrasound was performed of the mass, which was felt to be 4.4 x 1.2 x 1.6 cm.  On physical examination this is difficult to detect.  An MRI scan performed 04/11/2010 showed a mass measuring 2.8 x 1.6 x 1.7 cm in the right breast, upper outer quadrant.  The patient has been seen in consultation by Dr. MLisbeth Renshawfor consideration of possible radiation.  She was felt to be a good candidate for radiation therapy."  Her subsequent history is as detailed below.  INTERVAL HISTORY: MIrareturns today for follow up of her breast cancer, accompanied by her  daughter, as well as a medical interpreter by the name of Christy [name] She has been on anastrozole since March 201 and is tolerating it well. She denies arthralgias, hot flashes, or vaginal changes. The interval history is significant for an ED visit for hyperglycemia, and the patient has a personal history of uncontrolled diabetes. She does not check her blood sugars regularly at home and is unsure of her A1C at this time. She is not physically active regularly. She does minimal light housework.   REVIEW OF SYSTEMS: MBindidenies fevers, chills, nausea, vomiting, or changes in bowel or bladder habits. She has some shortness of breath with activity, but denies chest pain, palpitations, cough, or fatigue. A detail review of systems is otherwise noncontributory.   PAST MEDICAL HISTORY: Past Medical History  Diagnosis Date  . Hyperlipidemia   . Hypertension   . Diabetes mellitus   . Cancer     stage II right breast cancer  . Abnormal ECG     Inferior Q waves  . Cataract   . Hypercholesterolemia   . Osteoporosis   . Overactive bladder     PAST SURGICAL HISTORY: Past Surgical History  Procedure Laterality Date  . Breast surgery      right lumpectomy, sentinel node biopsy  . Portacath placement    . Joint replacement      right hip  . Fracture surgery      ORIF right distal radius fx  . Port-a-cath removal  09/11/2011    Procedure: REMOVAL PORT-A-CATH;  Surgeon:  Christy Bookbinder, Christy [name];  Location: [place];  Service: General;  Laterality: Left;  local   . Cataract extraction Bilateral 2008    FAMILY HISTORY Family History  Problem Relation Age of Onset  . Hypertension Sister   Both parents deceased, unclear causes.  One sister, three brothers.  All live in [place].   GYNECOLOGIC HISTORY: G3, P3.  Menopause at age 73.  No history of hormone replacement therapy.     SOCIAL HISTORY: The patient is a widow who has an adult son that lives with her.  Christy [name] was  married for 13 years.    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 She has been in the [place] for over 10 years.  She has 3 children here, one born in 1973, 1976, and 28.  Two are married.  She has 3 grandchildren.  In her spare time she enjoys watching TV, going with her friends out for coffee, and attending church.   ADVANCED DIRECTIVES: Not on file  HEALTH MAINTENANCE: History  Substance Use Topics  . Smoking status: Former Research scientist (life sciences)  . Smokeless tobacco: Never Used     Comment: quit 2009  . Alcohol Use: No    Colonoscopy: Not on file PAP: Not on file Bone density: The patient's last bone density scan on 08/01/2012 showed a T score of -3.2 (osteoporosis). Lipid panel: Not on file  No Known Allergies  Current Outpatient Prescriptions  Medication Sig Dispense Refill  . alendronate (FOSAMAX) 70 MG tablet Take 1 tablet (70 mg total) by mouth every 7 (seven) days. Take with a full glass of water on an empty stomach.  12 tablet  8  . Calcium Carb-Cholecalciferol (CALCIUM 1000 + D PO) Take 1 tablet by mouth daily.       . Cholecalciferol (VITAMIN D-3) 1000 UNITS CAPS Take 1 capsule by mouth daily.      . fenofibrate (TRICOR) 48 MG tablet Take 48 mg by mouth daily.      . furosemide (LASIX) 40 MG tablet Take 40 mg by mouth daily.      . insulin aspart (NOVOLOG) 100 UNIT/ML injection Inject 30 Units into the skin 2 (two) times daily with a meal. 151-200=10 u; 201-250=12 units; 251-300=14units;301-350=16 units      . insulin detemir (LEVEMIR) 100 UNIT/ML injection Inject 30 Units into the skin at bedtime.      [name] lisinopril (PRINIVIL,ZESTRIL) 40 MG tablet Take 40 mg by mouth daily.      . metFORMIN (GLUCOPHAGE-XR) 500 MG 24 hr tablet Take 1,000 mg by mouth 2 (two) times daily.      . NORVASC 5 MG tablet Take 5 mg by mouth daily.       . simvastatin (ZOCOR) 40 MG tablet Take 40 mg by mouth daily.      . VESICARE 10 MG  tablet Take 10 mg by mouth daily.       [name] anastrozole (ARIMIDEX) 1 MG tablet Take 1 tablet (1 mg total) by mouth daily.  90 tablet  3  . ibuprofen (ADVIL,MOTRIN) 800 MG tablet Take 800 mg by mouth every 8 (eight) hours as needed for fever or moderate pain.        No current facility-administered medications for this visit.    OBJECTIVE: Older white [name] who walks with a cane and had difficulty getting onto the examination table Filed Vitals:   08/07/14 1552  BP: 139/62  Pulse:  96  Temp: 98.7 F (37.1 C)  Resp: 19     Body mass index is 41.09 kg/(m^2).      ECOG FS: 1 - Symptomatic but completely ambulatory  Skin: warm, dry  HEENT: sclerae anicteric, conjunctivae pink, oropharynx clear. No thrush or mucositis.  Lymph Nodes: No cervical or supraclavicular lymphadenopathy  Lungs: clear to auscultation bilaterally, no rales, wheezes, or rhonci  Heart: regular rate and rhythm  Abdomen: round, soft, non tender, positive bowel sounds  Musculoskeletal: No focal spinal tenderness, no peripheral edema  Neuro: non focal, well oriented, positive affect  Breast: right breast status post lumpectomy and radiation. No evidence of local recurrence. Right axilla benign. Left breast unremarkable.    LAB RESULTS: Lab Results  Component Value Date   WBC 8.5 07/31/2014   NEUTROABS 6.3 07/31/2014   HGB 12.7 07/31/2014   HCT 38.5 07/31/2014   MCV 92.1 07/31/2014   PLT 182 07/31/2014      Chemistry      Component Value Date/Time   NA 136 07/31/2014 1552   NA 141 11/27/2013 1505   K 5.2* 07/31/2014 1552   K 4.3 11/27/2013 1505   CL 100 11/27/2013 1505   CL 103 03/21/2013 0934   CO2 25 07/31/2014 1552   CO2 23 11/27/2013 1505   BUN 48.4* 07/31/2014 1552   BUN 37* 11/27/2013 1505   CREATININE 1.8* 07/31/2014 1552   CREATININE 1.16* 11/27/2013 1505      Component Value Date/Time   CALCIUM 10.0 07/31/2014 1552   CALCIUM 10.1 11/27/2013 1505   ALKPHOS 51 07/31/2014 1552   ALKPHOS 56 11/27/2013 1505   AST  21 07/31/2014 1552   AST 15 11/27/2013 1505   ALT 28 07/31/2014 1552   ALT 23 11/27/2013 1505   BILITOT 0.39 07/31/2014 1552   BILITOT <0.2* 11/27/2013 1505       Lab Results  Component Value Date   LABCA2 25 01/22/2012    Urinalysis    Component Value Date/Time   COLORURINE YELLOW 11/27/2013 2012   APPEARANCEUR CLEAR 11/27/2013 2012   LABSPEC 1.017 11/27/2013 2012   PHURINE 5.0 11/27/2013 2012   GLUCOSEU 250* 11/27/2013 2012   HGBUR NEGATIVE 11/27/2013 2012   BILIRUBINUR NEGATIVE 11/27/2013 2012   [place] NEGATIVE 11/27/2013 2012   PROTEINUR NEGATIVE 11/27/2013 2012   UROBILINOGEN 0.2 11/27/2013 2012   NITRITE NEGATIVE 11/27/2013 2012   LEUKOCYTESUR NEGATIVE 11/27/2013 2012    STUDIES: Patient overdue for mammogram this year  Most recent bone density scan on 07/18/14 showed improving t-score of -2.7 (osteoporosis)  ASSESSMENT: 73 y.o. Christy [name]:  1.  Status post  right breast upper outer quadrant lumpectomy with right axillary sentinel node biopsy on 05/15/2010 for a pT1c pN1a, stage IIA, invasive ductal carcinoma, grade 3, estrogen receptor 100%, progesterone receptor 80%, MIB-1 of 37%, HER-2/neu by CISH no amplification; margins negative but close  2.  Oncotype DX report dated 06/03/2010 showed a breast cancer recurrence score of 43 with an average rate of distant recurrence of 30% if the patient's only systemic treatment was tamoxifen for 5 years.  3.   Status post breast lumpectomy for right posterior margin reexcision on 08/31/201, with no residual cancer noted  6.  Status post adjuvant chemotherapy with cyclophosphamide, methotrexate and fluorouracil ("CMF") x 6 cycles from 07/30/2010 through 11/12/2010.  7.  Status post adjuvant radiation therapy from 12/11/2010 through 01/26/2011.  8.  The patient started antiestrogen therapy with anastrozole on 01/2011.  9.  osteoporosis, on alendronate  weekly  PLAN:  Christy [name] continues to tolerate the anastrozole well with no side  effects that she is aware of. We will continue this drug for a total of 5 years of anti-estrogen therapy. She is overdue for her mammogram this year, so I have written orders to have this completed soon. Her bone density scan showed an improvement while on the alendronate, so she will continue with this treatment weekly. I also encouraged the patient to find small ways to keep physically fit, and suggested walking daily now that the weather outside is more pleasant.   The labs were reviewed in detail, and there were some concerning figures, including an elevated BUN, creatinine, and potassium level. She is an uncontrolled diabetic on insulin and metformin. This could possibly be the cause of her worsening kidney function. I advised her to make a follow up appointment with her PCP as soon as possible to investigate further. I encouraged her to check her blood sugars at least twice a day and record them in a log so that this information will be available at this appointment. She will also increase PO fluids to stay hydrated.  Christy [name] will return next year for labs and an office visit. She understands and agrees with this plan. She has been encourage to call with any issues that might arise before her next visit here.   Marcelino Duster, NP  08/07/2014, 4:34 PM

## 2014-08-22 ENCOUNTER — Ambulatory Visit
Admission: RE | Admit: 2014-08-22 | Discharge: 2014-08-22 | Disposition: A | Discharge status: Home / Self Care | Payer: Medicare Other | Source: Ambulatory Visit | Attending: Nurse Practitioner | Admitting: Nurse Practitioner

## 2014-08-22 DIAGNOSIS — C50411 Malignant neoplasm of upper-outer quadrant of right female breast: Secondary | ICD-10-CM

## 2015-08-09 ENCOUNTER — Telehealth: Payer: Self-pay | Admitting: Oncology

## 2015-08-09 NOTE — Telephone Encounter (Signed)
Returned call to dtr re r/s 10/10 appointment - patient cannot come. Left message for dtr with new appointments for 11/1 @ 2:45 pm. Schedule mailed

## 2015-08-12 ENCOUNTER — Other Ambulatory Visit: Payer: Medicaid Other

## 2015-08-12 ENCOUNTER — Ambulatory Visit: Payer: Medicaid Other | Admitting: Nurse Practitioner

## 2015-08-14 ENCOUNTER — Telehealth: Payer: Self-pay | Admitting: Nurse Practitioner

## 2015-08-14 NOTE — Telephone Encounter (Signed)
Patients daughter called and cancelled her appointment and will call back to reschedule

## 2015-08-20 ENCOUNTER — Telehealth: Payer: Self-pay | Admitting: Nurse Practitioner

## 2015-08-20 ENCOUNTER — Other Ambulatory Visit: Payer: Self-pay | Admitting: Oncology

## 2015-08-20 DIAGNOSIS — Z9889 Other specified postprocedural states: Secondary | ICD-10-CM

## 2015-08-20 DIAGNOSIS — C50911 Malignant neoplasm of unspecified site of right female breast: Secondary | ICD-10-CM

## 2015-08-20 NOTE — Telephone Encounter (Signed)
Patients daughter called in to reschedule her appointment °

## 2015-09-03 ENCOUNTER — Other Ambulatory Visit: Payer: Medicaid Other

## 2015-09-03 ENCOUNTER — Ambulatory Visit
Admission: RE | Admit: 2015-09-03 | Discharge: 2015-09-03 | Disposition: A | Discharge status: Home / Self Care | Payer: Medicare Other | Source: Ambulatory Visit | Attending: Oncology | Admitting: Oncology

## 2015-09-03 ENCOUNTER — Ambulatory Visit: Payer: Self-pay | Admitting: Nurse Practitioner

## 2015-09-03 DIAGNOSIS — Z9889 Other specified postprocedural states: Secondary | ICD-10-CM

## 2015-09-03 DIAGNOSIS — C50911 Malignant neoplasm of unspecified site of right female breast: Secondary | ICD-10-CM

## 2015-10-01 ENCOUNTER — Other Ambulatory Visit: Payer: Self-pay

## 2015-10-01 DIAGNOSIS — C50411 Malignant neoplasm of upper-outer quadrant of right female breast: Secondary | ICD-10-CM

## 2015-10-02 ENCOUNTER — Other Ambulatory Visit: Payer: Self-pay | Admitting: *Deleted

## 2015-10-02 ENCOUNTER — Other Ambulatory Visit (HOSPITAL_BASED_OUTPATIENT_CLINIC_OR_DEPARTMENT_OTHER): Payer: Medicare Other

## 2015-10-02 ENCOUNTER — Telehealth: Payer: Self-pay | Admitting: Nurse Practitioner

## 2015-10-02 ENCOUNTER — Ambulatory Visit (HOSPITAL_BASED_OUTPATIENT_CLINIC_OR_DEPARTMENT_OTHER): Payer: Medicare Other | Admitting: Nurse Practitioner

## 2015-10-02 ENCOUNTER — Encounter: Payer: Self-pay | Admitting: Nurse Practitioner

## 2015-10-02 VITALS — BP 138/54 | HR 86 | Temp 98.3°F | Resp 18 | Ht 62.0 in | Wt 227.9 lb

## 2015-10-02 DIAGNOSIS — Z23 Encounter for immunization: Secondary | ICD-10-CM | POA: Diagnosis not present

## 2015-10-02 DIAGNOSIS — R739 Hyperglycemia, unspecified: Secondary | ICD-10-CM

## 2015-10-02 DIAGNOSIS — Z853 Personal history of malignant neoplasm of breast: Secondary | ICD-10-CM

## 2015-10-02 DIAGNOSIS — M810 Age-related osteoporosis without current pathological fracture: Secondary | ICD-10-CM

## 2015-10-02 DIAGNOSIS — Z794 Long term (current) use of insulin: Secondary | ICD-10-CM

## 2015-10-02 DIAGNOSIS — E559 Vitamin D deficiency, unspecified: Secondary | ICD-10-CM

## 2015-10-02 DIAGNOSIS — C50411 Malignant neoplasm of upper-outer quadrant of right female breast: Secondary | ICD-10-CM

## 2015-10-02 DIAGNOSIS — E1165 Type 2 diabetes mellitus with hyperglycemia: Secondary | ICD-10-CM

## 2015-10-02 DIAGNOSIS — IMO0001 Reserved for inherently not codable concepts without codable children: Secondary | ICD-10-CM

## 2015-10-02 LAB — CBC WITH DIFFERENTIAL/PLATELET
BASO%: 0.1 % (ref 0.0–2.0)
Basophils Absolute: 0 10*3/uL (ref 0.0–0.1)
EOS ABS: 0.1 10*3/uL (ref 0.0–0.5)
EOS%: 1 % (ref 0.0–7.0)
HCT: 37.8 % (ref 34.8–46.6)
HGB: 12.5 g/dL (ref 11.6–15.9)
LYMPH#: 1.9 10*3/uL (ref 0.9–3.3)
LYMPH%: 23.7 % (ref 14.0–49.7)
MCH: 31.2 pg (ref 25.1–34.0)
MCHC: 33.1 g/dL (ref 31.5–36.0)
MCV: 94.3 fL (ref 79.5–101.0)
MONO#: 0.5 10*3/uL (ref 0.1–0.9)
MONO%: 6.5 % (ref 0.0–14.0)
NEUT%: 68.7 % (ref 38.4–76.8)
NEUTROS ABS: 5.4 10*3/uL (ref 1.5–6.5)
Platelets: 170 10*3/uL (ref 145–400)
RBC: 4.01 10*6/uL (ref 3.70–5.45)
RDW: 13.1 % (ref 11.2–14.5)
WBC: 7.8 10*3/uL (ref 3.9–10.3)

## 2015-10-02 LAB — COMPREHENSIVE METABOLIC PANEL (CC13)
ALT: 16 U/L (ref 0–55)
ANION GAP: 11 meq/L (ref 3–11)
AST: 14 U/L (ref 5–34)
Albumin: 3.6 g/dL (ref 3.5–5.0)
Alkaline Phosphatase: 53 U/L (ref 40–150)
BUN: 20.1 mg/dL (ref 7.0–26.0)
CHLORIDE: 104 meq/L (ref 98–109)
CO2: 26 mEq/L (ref 22–29)
Calcium: 10.2 mg/dL (ref 8.4–10.4)
Creatinine: 1.8 mg/dL — ABNORMAL HIGH (ref 0.6–1.1)
EGFR: 28 mL/min/{1.73_m2} — ABNORMAL LOW (ref >90)
Glucose: 331 mg/dl — ABNORMAL HIGH (ref 70–140)
Potassium: 4.2 mEq/L (ref 3.5–5.1)
Sodium: 141 mEq/L (ref 136–145)
Total Bilirubin: <0.3 mg/dL (ref 0.20–1.20)
Total Protein: 7 g/dL (ref 6.4–8.3)

## 2015-10-02 MED ORDER — ANASTROZOLE 1 MG PO TABS: 1.0000 mg | ORAL_TABLET | Freq: Every day | ORAL | Status: DC

## 2015-10-02 MED ORDER — INFLUENZA VAC SPLIT QUAD 0.5 ML IM SUSY
0.5000 mL | PREFILLED_SYRINGE | Freq: Once | INTRAMUSCULAR | Status: AC
  Administered 2015-10-02: 0.5 mL via INTRAMUSCULAR
  Filled 2015-10-02: qty 0.5

## 2015-10-02 NOTE — Telephone Encounter (Signed)
Appointments made and avs printed for patient °

## 2015-10-02 NOTE — Progress Notes (Signed)
[facility]  [phone] [phone]  OFFICE PROGRESS NOTE   ID: Christy [name]   DOB: [birth year]  MR#: [id number]  OEH#:[id number]  Pcp Not In System  PCP: Christy Ram, MD SU: Christy [name], M.D. RAD ONC: Christy [name]., M.D.  CHIEF COMPLAINT: hx right breast cancer CURRENT TREATMENT: anastrozole 31m daily, fosamax  BREAST CANCER HISTORY: From Dr. RJulien Girtnew patient evaluation note dated 04/29/2010: "This is a pleasant 74year old woman here today with an interpreter, as she speaks [place] for discussion of recent diagnosis of breast cancer.  This patient has not had a recent mammogram.  Circumstances of her recent detection of breast cancer are a little unclear.  It would appear that she may have self detected a mass in her right breast.  She had a diagnostic screening mammogram on 03/28/2010, which showed a possible mass in the right breast with calcifications.  Diagnostic right mammogram was also performed.  A biopsy was performed on 04/04/2010.  Pathology indicated an invasive ductal carcinoma, tumor necrosis with lymphatic invasion was present.  ER and PR positive at 100% and 8% respectively, proliferative index 37%.  HER-2 was not amplified at a ratio of 1.22.  The patient has been seen by Dr. WDonne [name] who in turn has referred her for possible neoadjuvant therapy.  She has also been seen by Dr. MLisbeth [name]  An ultrasound was performed of the mass, which was felt to be 4.4 x 1.2 x 1.6 cm.  On physical examination this is difficult to detect.  An MRI scan performed 04/11/2010 showed a mass measuring 2.8 x 1.6 x 1.7 cm in the right breast, upper outer quadrant.  The patient has been seen in consultation by Dr. MLisbeth Renshawfor consideration of possible radiation.  She was felt to be a good candidate for radiation therapy."  Her subsequent history is as detailed below.  INTERVAL HISTORY: [name] today for follow up of her breast cancer, accompanied by her  daughter. She has been on anastrozole since March 2012 and is tolerating it well with no side effects that she is aware of. She denies arthralgias, hot flashes, or vaginal changes. The interval history is generally unremarkable.  REVIEW OF SYSTEMS: MBreahwalks with a cane. She has some generalized joint pain. She has shortness of breath with exertion. A detail review of systems is otherwise noncontributory.   PAST MEDICAL HISTORY: Past Medical History  Diagnosis Date  . Hyperlipidemia   . Hypertension   . Diabetes mellitus   . Cancer     stage II right breast cancer  . Abnormal ECG     Inferior Q waves  . Cataract   . Hypercholesterolemia   . Osteoporosis   . Overactive bladder     PAST SURGICAL HISTORY: Past Surgical History  Procedure Laterality Date  . Breast surgery      right lumpectomy, sentinel node biopsy  . Portacath placement    . Joint replacement      right hip  . Fracture surgery      ORIF right distal radius fx  . Port-a-cath removal  09/11/2011    Procedure: REMOVAL PORT-A-CATH;  Surgeon: MRolm Bookbinder MD;  [facility]  Service: General;  Laterality: Left;  local   . Cataract extraction Bilateral 2008    FAMILY HISTORY Family History  Problem Relation Age of Onset  . Hypertension Sister   Both parents deceased, unclear causes.  One sister, three brothers.  All live in [place]  GYNECOLOGIC HISTORY: G3, P3.  Menopause at age 44.  No history of hormone replacement therapy.     SOCIAL HISTORY: The patient is a widow who has an adult son that lives with her.  Christy [name] was married for 13 years.    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 She has been in the [place] for over 10 years.  She has 3 children here, one born in 1973, 1976, and 67.  Two are married.  She has 3 grandchildren.  In her spare time she enjoys watching TV, going with her friends out  for coffee, and attending church.   ADVANCED DIRECTIVES: Not on file  HEALTH MAINTENANCE: Social History  Substance Use Topics  . Smoking status: Former Research scientist (life sciences)  . Smokeless tobacco: Never Used     Comment: quit 2009  . Alcohol Use: No    Colonoscopy: Not on file PAP: Not on file Bone density: The patient's last bone density scan on 08/01/2012 showed a T score of -3.2 (osteoporosis). Lipid panel: Not on file  No Known Allergies  Current Outpatient Prescriptions  Medication Sig Dispense Refill  . alendronate (FOSAMAX) 70 MG tablet Take 1 tablet (70 mg total) by mouth every 7 (seven) days. Take with a full glass of water on an empty stomach. 12 tablet 8  . anastrozole (ARIMIDEX) 1 MG tablet Take 1 tablet (1 mg total) by mouth daily. 90 tablet 3  . Calcium Carb-Cholecalciferol (CALCIUM 1000 + D PO) Take 1 tablet by mouth daily.     . Cholecalciferol (VITAMIN D-3) 1000 UNITS CAPS Take 1 capsule by mouth daily.    . fenofibrate (TRICOR) 48 MG tablet Take 48 mg by mouth daily.    . furosemide (LASIX) 40 MG tablet Take 40 mg by mouth daily.    . insulin aspart (NOVOLOG) 100 UNIT/ML injection Inject 30 Units into the skin 2 (two) times daily with a meal. 151-200=10 u; 201-250=12 units; 251-300=14units;301-350=16 units    . insulin detemir (LEVEMIR) 100 UNIT/ML injection Inject 30 Units into the skin at bedtime.    [name] lisinopril (PRINIVIL,ZESTRIL) 40 MG tablet Take 40 mg by mouth daily.    . metFORMIN (GLUCOPHAGE-XR) 500 MG 24 hr tablet Take 1,000 mg by mouth 2 (two) times daily.    . NORVASC 5 MG tablet Take 5 mg by mouth daily.     . simvastatin (ZOCOR) 40 MG tablet Take 40 mg by mouth daily.    . VESICARE 10 MG tablet Take 10 mg by mouth daily.     [name] ibuprofen (ADVIL,MOTRIN) 800 MG tablet Take 800 mg by mouth every 8 (eight) hours as needed for fever or moderate pain.      No current facility-administered medications for this visit.    OBJECTIVE: [name] who walks with a  cane and had difficulty getting onto the examination table Filed Vitals:   10/02/15 1450  BP: 138/54  Pulse: 86  Temp: 98.3 F (36.8 C)  Resp: 18     Body mass index is 41.67 kg/(m^2).      ECOG FS: 1 - Symptomatic but completely ambulatory  Skin: warm, dry  HEENT: sclerae anicteric, conjunctivae pink, oropharynx clear. No thrush or mucositis.  Lymph Nodes: No cervical or supraclavicular lymphadenopathy  Lungs: clear to auscultation bilaterally, no rales, wheezes, or rhonci  Heart: regular rate and rhythm  Abdomen: round, soft, non tender, positive bowel sounds  Musculoskeletal: No focal  spinal tenderness, no peripheral edema  Neuro: non focal, well oriented, positive affect  Breast: right breast status post lumpectomy and radiation. No evidence of local recurrence. Right axilla benign. Left breast unremarkable.    LAB RESULTS: Lab Results  Component Value Date   WBC 7.8 10/02/2015   NEUTROABS 5.4 10/02/2015   HGB 12.5 10/02/2015   HCT 37.8 10/02/2015   MCV 94.3 10/02/2015   PLT 170 10/02/2015      Chemistry      Component Value Date/Time   NA 141 10/02/2015 1426   NA 141 11/27/2013 1505   K 4.2 10/02/2015 1426   K 4.3 11/27/2013 1505   CL 100 11/27/2013 1505   CL 103 03/21/2013 0934   CO2 26 10/02/2015 1426   CO2 23 11/27/2013 1505   BUN 20.1 10/02/2015 1426   BUN 37* 11/27/2013 1505   CREATININE 1.8* 10/02/2015 1426   CREATININE 1.16* 11/27/2013 1505      Component Value Date/Time   CALCIUM 10.2 10/02/2015 1426   CALCIUM 10.1 11/27/2013 1505   ALKPHOS 53 10/02/2015 1426   ALKPHOS 56 11/27/2013 1505   AST 14 10/02/2015 1426   AST 15 11/27/2013 1505   ALT 16 10/02/2015 1426   ALT 23 11/27/2013 1505   BILITOT <0.30 10/02/2015 1426   BILITOT <0.2* 11/27/2013 1505       Lab Results  Component Value Date   LABCA2 25 01/22/2012    Urinalysis    Component Value Date/Time   COLORURINE YELLOW 11/27/2013 2012   APPEARANCEUR CLEAR 11/27/2013 2012    LABSPEC 1.017 11/27/2013 2012   PHURINE 5.0 11/27/2013 2012   GLUCOSEU 250* 11/27/2013 2012   HGBUR NEGATIVE 11/27/2013 2012   BILIRUBINUR NEGATIVE 11/27/2013 2012   [place] NEGATIVE 11/27/2013 2012   PROTEINUR NEGATIVE 11/27/2013 2012   UROBILINOGEN 0.2 11/27/2013 2012   NITRITE NEGATIVE 11/27/2013 2012   LEUKOCYTESUR NEGATIVE 11/27/2013 2012    STUDIES: Mm Diag Breast Tomo Bilateral  09/03/2015  CLINICAL DATA:  History of treated right breast cancer, status post lumpectomy, chemotherapy and radiation therapy in 2011. EXAM: DIGITAL DIAGNOSTIC BILATERAL MAMMOGRAM WITH 3D TOMOSYNTHESIS AND CAD COMPARISON:  Previous exam(s). ACR Breast Density Category b: There are scattered areas of fibroglandular density. FINDINGS: There are no suspicious masses, areas of nonsurgical architectural distortion or microcalcifications in either breast. Stable postsurgical and posttreatment changes in the right breast are noted. Mammographic images were processed with CAD. IMPRESSION: No mammographic evidence of malignancy in either breast, status post right lumpectomy. RECOMMENDATION: Diagnostic mammogram is suggested in 1 year. (Code:DM-B-01Y) I have discussed the findings and recommendations with the patient. Results were also provided in writing at the conclusion of the visit. If applicable, a reminder letter will be sent to the patient regarding the next appointment. BI-RADS CATEGORY  2: Benign. Electronically Signed   By: Fidela Salisbury M.D.   On: 09/03/2015 14:43    Most recent bone density scan on 07/18/14 showed improving t-score of -2.7 (osteoporosis)  ASSESSMENT: 74 y.o. [place] [name] woman:  1.  Status post  right breast upper outer quadrant lumpectomy with right axillary sentinel node biopsy on 05/15/2010 for a pT1c pN1a, stage IIA, invasive ductal carcinoma, grade 3, estrogen receptor 100%, progesterone receptor 80%, MIB-1 of 37%, HER-2/neu by CISH no amplification; margins negative but close  2.   Oncotype DX report dated 06/03/2010 showed a breast cancer recurrence score of 43 with an average rate of distant recurrence of 30% if the patient's only systemic treatment was tamoxifen  for 5 years.  3.   Status post breast lumpectomy for right posterior margin reexcision on 07/02/2010, with no residual cancer noted  6.  Status post adjuvant chemotherapy with cyclophosphamide, methotrexate and fluorouracil ("CMF") x 6 cycles from 07/30/2010 through 11/12/2010.  7.  Status post adjuvant radiation therapy from 12/11/2010 through 01/26/2011.  8.  The patient started antiestrogen therapy with anastrozole on 01/2011.  9.  osteoporosis, on alendronate weekly  PLAN: Christy [name] is doing well as far as her breast cancer is concerned. She is now 5 years out from her definitve surgery with no evidence of recurrent disease. She is tolerating the anastrozole well with no side effects that she is aware of. Her most recent mammogram was entirely negative.   We reviewed the labs, which once again showed hyperglycemia and and elevated creatinine. I counseled her daughter about the risks of uncontrolled diabetes over a long period of time. I am unsure about how effective this was, as Christy [name] lives with her son and he is at work full time.   Christy [name] will return next summer for what will be her last visit. She will be eligible to stop anastrozole at that point and "graduate" from follow up visits. She understands and agrees with this plan. She knows the goal of treatment in her case is cure. She has been encouraged to call with any issues that might arise before her next visit here.  Christy Panda, NP  10/02/2015, 3:41 PM

## 2016-03-21 ENCOUNTER — Emergency Department (HOSPITAL_COMMUNITY)
Admission: EM | Admit: 2016-03-21 | Discharge: 2016-03-21 | Disposition: A | Discharge status: Home / Self Care | Payer: Medicare Other | Attending: Emergency Medicine | Admitting: Emergency Medicine

## 2016-03-21 ENCOUNTER — Encounter (HOSPITAL_COMMUNITY): Payer: Self-pay

## 2016-03-21 DIAGNOSIS — E785 Hyperlipidemia, unspecified: Secondary | ICD-10-CM | POA: Insufficient documentation

## 2016-03-21 DIAGNOSIS — Z79899 Other long term (current) drug therapy: Secondary | ICD-10-CM | POA: Diagnosis not present

## 2016-03-21 DIAGNOSIS — Z794 Long term (current) use of insulin: Secondary | ICD-10-CM | POA: Diagnosis not present

## 2016-03-21 DIAGNOSIS — Z853 Personal history of malignant neoplasm of breast: Secondary | ICD-10-CM | POA: Insufficient documentation

## 2016-03-21 DIAGNOSIS — Z87891 Personal history of nicotine dependence: Secondary | ICD-10-CM | POA: Insufficient documentation

## 2016-03-21 DIAGNOSIS — I10 Essential (primary) hypertension: Secondary | ICD-10-CM | POA: Insufficient documentation

## 2016-03-21 DIAGNOSIS — Z791 Long term (current) use of non-steroidal anti-inflammatories (NSAID): Secondary | ICD-10-CM | POA: Diagnosis not present

## 2016-03-21 DIAGNOSIS — E1136 Type 2 diabetes mellitus with diabetic cataract: Secondary | ICD-10-CM | POA: Insufficient documentation

## 2016-03-21 DIAGNOSIS — R609 Edema, unspecified: Secondary | ICD-10-CM

## 2016-03-21 DIAGNOSIS — L03116 Cellulitis of left lower limb: Secondary | ICD-10-CM | POA: Diagnosis not present

## 2016-03-21 DIAGNOSIS — M799 Soft tissue disorder, unspecified: Secondary | ICD-10-CM | POA: Diagnosis present

## 2016-03-21 DIAGNOSIS — Z7984 Long term (current) use of oral hypoglycemic drugs: Secondary | ICD-10-CM | POA: Insufficient documentation

## 2016-03-21 DIAGNOSIS — Z96641 Presence of right artificial hip joint: Secondary | ICD-10-CM | POA: Insufficient documentation

## 2016-03-21 LAB — CBC WITH DIFFERENTIAL/PLATELET
BASOS PCT: 0 %
Basophils Absolute: 0 10*3/uL (ref 0.0–0.1)
EOS ABS: 0.1 10*3/uL (ref 0.0–0.7)
Eosinophils Relative: 1 %
HEMATOCRIT: 39.2 % (ref 36.0–46.0)
Hemoglobin: 12.8 g/dL (ref 12.0–15.0)
Lymphocytes Relative: 21 %
Lymphs Abs: 1.8 10*3/uL (ref 0.7–4.0)
MCH: 30.2 pg (ref 26.0–34.0)
MCHC: 32.7 g/dL (ref 30.0–36.0)
MCV: 92.5 fL (ref 78.0–100.0)
MONO ABS: 0.6 10*3/uL (ref 0.1–1.0)
MONOS PCT: 7 %
Neutro Abs: 6.4 10*3/uL (ref 1.7–7.7)
Neutrophils Relative %: 71 %
Platelets: 190 10*3/uL (ref 150–400)
RBC: 4.24 MIL/uL (ref 3.87–5.11)
RDW: 13.7 % (ref 11.5–15.5)
WBC: 8.8 10*3/uL (ref 4.0–10.5)

## 2016-03-21 LAB — CBG MONITORING, ED: GLUCOSE-CAPILLARY: 319 mg/dL — AB (ref 65–99)

## 2016-03-21 LAB — BASIC METABOLIC PANEL
Anion gap: 11 (ref 5–15)
BUN: 27 mg/dL — ABNORMAL HIGH (ref 6–20)
CALCIUM: 10.4 mg/dL — AB (ref 8.9–10.3)
CO2: 24 mmol/L (ref 22–32)
CREATININE: 1.16 mg/dL — AB (ref 0.44–1.00)
Chloride: 103 mmol/L (ref 101–111)
GFR calc non Af Amer: 45 mL/min — ABNORMAL LOW (ref >60)
GFR, EST AFRICAN AMERICAN: 52 mL/min — AB (ref >60)
Glucose, Bld: 389 mg/dL — ABNORMAL HIGH (ref 65–99)
Potassium: 4.5 mmol/L (ref 3.5–5.1)
SODIUM: 138 mmol/L (ref 135–145)

## 2016-03-21 MED ORDER — CEPHALEXIN 250 MG PO CAPS
500.0000 mg | ORAL_CAPSULE | Freq: Once | ORAL | Status: AC
  Administered 2016-03-21: 500 mg via ORAL
  Filled 2016-03-21: qty 2

## 2016-03-21 MED ORDER — CEPHALEXIN 500 MG PO CAPS: 500.0000 mg | ORAL_CAPSULE | Freq: Three times a day (TID) | ORAL | Status: DC

## 2016-03-21 MED ORDER — FUROSEMIDE 40 MG PO TABS: ORAL_TABLET | ORAL | Status: DC

## 2016-03-21 MED ORDER — FUROSEMIDE 20 MG PO TABS
20.0000 mg | ORAL_TABLET | Freq: Every day | ORAL | Status: DC
  Administered 2016-03-21: 20 mg via ORAL
  Filled 2016-03-21: qty 1

## 2016-03-21 MED ORDER — POTASSIUM CHLORIDE ER 10 MEQ PO TBCR: 10.0000 meq | EXTENDED_RELEASE_TABLET | Freq: Two times a day (BID) | ORAL | Status: DC

## 2016-03-21 MED ORDER — FUROSEMIDE 20 MG PO TABS: ORAL_TABLET | ORAL | Status: DC

## 2016-03-21 NOTE — Discharge Instructions (Signed)
Increase her Lasix to 20 mg morning, and evening for the next 5 days. Take a dose of potassium, with each dose of Lasix. Keflex, antibiotic, 3 times per day for the next 10 days. Return to the ER with any worsening.  Follow-up with her primary care physician if improving, but unresolved at 10 days    Cellulitis Cellulitis is an infection of the skin and the tissue beneath it. The infected area is usually red and tender. Cellulitis occurs most often in the arms and lower legs.  CAUSES  Cellulitis is caused by bacteria that enter the skin through cracks or cuts in the skin. The most common types of bacteria that cause cellulitis are staphylococci and streptococci. SIGNS AND SYMPTOMS   Redness and warmth.  Swelling.  Tenderness or pain.  Fever. DIAGNOSIS  Your health care provider can usually determine what is wrong based on a physical exam. Blood tests may also be done. TREATMENT  Treatment usually involves taking an antibiotic medicine. HOME CARE INSTRUCTIONS   Take your antibiotic medicine as directed by your health care provider. Finish the antibiotic even if you start to feel better.  Keep the infected arm or leg elevated to reduce swelling.  Apply a warm cloth to the affected area up to 4 times per day to relieve pain.  Take medicines only as directed by your health care provider.  Keep all follow-up visits as directed by your health care provider. SEEK MEDICAL CARE IF:   You notice red streaks coming from the infected area.  Your red area gets larger or turns dark in color.  Your bone or joint underneath the infected area becomes painful after the skin has healed.  Your infection returns in the same area or another area.  You notice a swollen bump in the infected area.  You develop new symptoms.  You have a fever. SEEK IMMEDIATE MEDICAL CARE IF:   You feel very sleepy.  You develop vomiting or diarrhea.  You have a general ill feeling (malaise) with muscle  aches and pains.   This information is not intended to replace advice given to you by your health care provider. Make sure you discuss any questions you have with your health care provider.   Document Released: 07/29/2005 Document Revised: 07/10/2015 Document Reviewed: 01/04/2012 Elsevier Interactive Patient Education 2016 Else[employer] Inc[employer]  Edema Edema is an abnormal buildup of fluids in your bodytissues. Edema is somewhatdependent on gravity to pull the fluid to the lowest place in your body. That makes the condition more common in the legs and thighs (lower extremities). Painless swelling of the feet and ankles is common and becomes more likely as you get older. It is also common in looser tissues, like around your eyes.  When the affected area is squeezed, the fluid may move out of that spot and leave a dent for a few moments. This dent is called pitting.  CAUSES  There are many possible causes of edema. Eating too much salt and being on your feet or sitting for a long time can cause edema in your legs and ankles. Hot weather may make edema worse. Common medical causes of edema include:  Heart failure.  Liver disease.  Kidney disease.  Weak blood vessels in your legs.  Cancer.  An injury.  Pregnancy.  Some medications.  Obesity. SYMPTOMS  Edema is usually painless.Your skin may look swollen or shiny.  DIAGNOSIS  Your health care provider may be able to diagnose edema by asking about your  medical history and doing a physical exam. You may need to have tests such as X-rays, an electrocardiogram, or blood tests to check for medical conditions that may cause edema.  TREATMENT  Edema treatment depends on the cause. If you have heart, liver, or kidney disease, you need the treatment appropriate for these conditions. General treatment may include:  Elevation of the affected body part above the level of your heart.  Compression of the affected body part. Pressure from elastic  bandages or support stockings squeezes the tissues and forces fluid back into the blood vessels. This keeps fluid from entering the tissues.  Restriction of fluid and salt intake.  Use of a water pill (diuretic). These medications are appropriate only for some types of edema. They pull fluid out of your body and make you urinate more often. This gets rid of fluid and reduces swelling, but diuretics can have side effects. Only use diuretics as directed by your health care provider. HOME CARE INSTRUCTIONS   Keep the affected body part above the level of your heart when you are lying down.   Do not sit still or stand for prolonged periods.   Do not put anything directly under your knees when lying down.  Do not wear constricting clothing or garters on your upper legs.   Exercise your legs to work the fluid back into your blood vessels. This may help the swelling go down.   Wear elastic bandages or support stockings to reduce ankle swelling as directed by your health care provider.   Eat a low-salt diet to reduce fluid if your health care provider recommends it.   Only take medicines as directed by your health care provider. SEEK MEDICAL CARE IF:   Your edema is not responding to treatment.  You have heart, liver, or kidney disease and notice symptoms of edema.  You have edema in your legs that does not improve after elevating them.   You have sudden and unexplained weight gain. SEEK IMMEDIATE MEDICAL CARE IF:   You develop shortness of breath or chest pain.   You cannot breathe when you lie down.  You develop pain, redness, or warmth in the swollen areas.   You have heart, liver, or kidney disease and suddenly get edema.  You have a fever and your symptoms suddenly get worse. MAKE SURE YOU:   Understand these instructions.  Will watch your condition.  Will get help right away if you are not doing well or get worse.   This information is not intended to replace  advice given to you by your health care provider. Make sure you discuss any questions you have with your health care provider.   Document Released: 10/19/2005 Document Revised: 11/09/2014 Document Reviewed: 08/11/2013 Elsevier Interactive Patient Education [employer].

## 2016-03-21 NOTE — ED Notes (Signed)
Patient here with bilateral lower leg swelling with redness and blisters xx 2 days, reports this started after patient has been scratching same

## 2016-03-21 NOTE — ED Provider Notes (Signed)
CSN: [id number]     Arrival date & time 03/21/16  [id number] History   First MD Initiated Contact with Patient 03/21/16 1103     Chief Complaint  Patient presents with  . bilateral leg swelling/blisters       HPI  Patient presents for evaluation. She is accompanied by her son. Patient is from [place]. Does not speaking [place]. Son is able to interpret. They declined formal interpreter.  Patient reports a history of bilateral lower extremity edema that is chronic, for which she takes Lasix. Takes 40 mg once daily. Has noticed slight increase swelling over the past week. She states that when her legs get swollen they itch. She's been scratching. Now has some blistering and redness on the anterior aspect of her left lower leg. No fevers or chills. Does not feel poorly in general. No shortness of breath PND orthopnea chest pain or shortness of breath.     Past Medical History  Diagnosis Date  . Hyperlipidemia   . Hypertension   . Diabetes mellitus   . Cancer ([place])     stage II right breast cancer  . Abnormal ECG     Inferior Q waves  . Cataract   . Hypercholesterolemia   . Osteoporosis   . Overactive bladder    Past Surgical History  Procedure Laterality Date  . Breast surgery      right lumpectomy, sentinel node biopsy  . Portacath placement    . Joint replacement      right hip  . Fracture surgery      ORIF right distal radius fx  . Port-a-cath removal  09/11/2011    Procedure: REMOVAL PORT-A-CATH;  Surgeon: Rolm Bookbinder, MD;  Location: [facility];  Service: General;  Laterality: Left;  local   . Cataract extraction Bilateral 2008   Family History  Problem Relation Age of Onset  . Hypertension Sister    Social History  Substance Use Topics  . Smoking status: Former Research scientist (life sciences)  . Smokeless tobacco: Never Used     Comment: quit 2009  . Alcohol Use: No   OB History    No data available     Review of Systems  Constitutional: Negative for fever, chills,  diaphoresis, appetite change and fatigue.  HENT: Negative for mouth sores, sore throat and trouble swallowing.   Eyes: Negative for visual disturbance.  Respiratory: Negative for cough, chest tightness, shortness of breath and wheezing.   Cardiovascular: Negative for chest pain.  Gastrointestinal: Negative for nausea, vomiting, abdominal pain, diarrhea and abdominal distention.  Endocrine: Negative for polydipsia, polyphagia and polyuria.  Genitourinary: Negative for dysuria, frequency and hematuria.  Musculoskeletal: Negative for gait problem.  Skin: Negative for color change, pallor and rash.       Symmetric edema bilateral lower extremities. Blistering and redness left lower extremity.  Neurological: Negative for dizziness, syncope, light-headedness and headaches.  Hematological: Does not bruise/bleed easily.  Psychiatric/Behavioral: Negative for behavioral problems and confusion.      Allergies  Review of patient's allergies indicates no known allergies.  Home Medications   Prior to Admission medications   Medication Sig Start Date End Date Taking? Authorizing Provider  alendronate (FOSAMAX) 70 MG tablet Take 1 tablet (70 mg total) by mouth every 7 (seven) days. Take with a full glass of water on an empty stomach. 08/07/13   Chauncey Cruel, MD  anastrozole (ARIMIDEX) 1 MG tablet Take 1 tablet (1 mg total) by mouth daily. 10/02/15   Laurie Panda,  NP  Calcium Carb-Cholecalciferol (CALCIUM 1000 + D PO) Take 1 tablet by mouth daily.     Historical Provider, MD  Cholecalciferol (VITAMIN D-3) 1000 UNITS CAPS Take 1 capsule by mouth daily.    Historical Provider, MD  fenofibrate (TRICOR) 48 MG tablet Take 48 mg by mouth daily.    Historical Provider, MD  furosemide (LASIX) 40 MG tablet Take 40 mg by mouth daily.    Historical Provider, MD  ibuprofen (ADVIL,MOTRIN) 800 MG tablet Take 800 mg by mouth every 8 (eight) hours as needed for fever or moderate pain.  02/28/13   Historical  Provider, MD  insulin aspart (NOVOLOG) 100 UNIT/ML injection Inject 30 Units into the skin 2 (two) times daily with a meal. 151-200=10 u; 201-250=12 units; 251-300=14units;301-350=16 units    Historical Provider, MD  insulin detemir (LEVEMIR) 100 UNIT/ML injection Inject 30 Units into the skin at bedtime.    Historical Provider, MD  lisinopril (PRINIVIL,ZESTRIL) 40 MG tablet Take 40 mg by mouth daily.    Historical Provider, MD  metFORMIN (GLUCOPHAGE-XR) 500 MG 24 hr tablet Take 1,000 mg by mouth 2 (two) times daily.    Historical Provider, MD  NORVASC 5 MG tablet Take 5 mg by mouth daily.  08/20/12   Historical Provider, MD  simvastatin (ZOCOR) 40 MG tablet Take 40 mg by mouth daily.    Historical Provider, MD  VESICARE 10 MG tablet Take 10 mg by mouth daily.  08/20/12   Historical Provider, MD   BP 160/68 mmHg  Pulse 72  Temp(Src) 98.1 F (36.7 C) (Oral)  Resp 20  SpO2 96% Physical Exam  Constitutional: She is oriented to person, place, and time. She appears well-developed and well-nourished. No distress.  HENT:  Head: Normocephalic.  Eyes: Conjunctivae are normal. Pupils are equal, round, and reactive to light. No scleral icterus.  Neck: Normal range of motion. Neck supple. No thyromegaly present.  Cardiovascular: Normal rate and regular rhythm.  Exam reveals no gallop and no friction rub.   No murmur heard. Pulmonary/Chest: Effort normal and breath sounds normal. No respiratory distress. She has no wheezes. She has no rales.  Abdominal: Soft. Bowel sounds are normal. She exhibits no distension. There is no tenderness. There is no rebound.  Musculoskeletal: Normal range of motion.  Neurological: She is alert and oriented to person, place, and time.  Skin: Skin is warm and dry. No rash noted.     Psychiatric: She has a normal mood and affect. Her behavior is normal.    ED Course  Procedures (including critical care time) Labs Review Labs Reviewed  BASIC METABOLIC PANEL -  Abnormal; Notable for the following:    Glucose, Bld 389 (*)    BUN 27 (*)    Creatinine, Ser 1.16 (*)    Calcium 10.4 (*)    GFR calc non Af Amer 45 (*)    GFR calc Af Amer 52 (*)    All other components within normal limits  CBG MONITORING, ED - Abnormal; Notable for the following:    Glucose-Capillary 319 (*)    All other components within normal limits  CBC WITH DIFFERENTIAL/PLATELET    Imaging Review No results found. I have personally reviewed and evaluated these images and lab results as part of my medical decision-making.   EKG Interpretation   Date/Time:  Saturday Mar 21 2016 11:25:16 EDT Ventricular Rate:  74 PR Interval:    QRS Duration: 142 QT Interval:  414 QTC Calculation: 459 R Axis:  172 Text Interpretation:  Normal sinus rhythm --confirmed with monitor Right  bundle branch block Anterolateral infarct, age indeterminate Baseline  wander in lead(s) V2 Confirmed by Jeneen Rinks  MD, [place] ([id number]) on 03/21/2016  12:53:37 PM      MDM   Final diagnoses:  Cellulitis of left lower extremity  Dependent edema    Creatinine 1.16. This is about baseline for her. Glucose 389. Stones states this is not unusual for her. Not acidotic or in DKA. No distress. Afebrile. Appropriate for outpatient treatment. We'll double her Lasix for 5 days, Keflex 3 times a day, primary care follow-up. Sliding scale for sugars.    Tanna Furry, MD 03/21/16 [phone]

## 2016-03-21 NOTE — ED Notes (Signed)
Family at bedside. 

## 2016-06-15 ENCOUNTER — Other Ambulatory Visit: Payer: Self-pay | Admitting: *Deleted

## 2016-06-15 DIAGNOSIS — C50411 Malignant neoplasm of upper-outer quadrant of right female breast: Secondary | ICD-10-CM

## 2016-06-16 ENCOUNTER — Other Ambulatory Visit: Payer: Medicare Other

## 2016-06-16 ENCOUNTER — Ambulatory Visit: Payer: Medicaid Other | Admitting: Oncology

## 2016-06-16 ENCOUNTER — Telehealth: Payer: Self-pay | Admitting: Oncology

## 2016-06-16 ENCOUNTER — Encounter: Payer: Self-pay | Admitting: Oncology

## 2016-06-16 NOTE — Telephone Encounter (Signed)
Per staff message r.s pt appt per pt request letter sent by mail

## 2016-08-03 ENCOUNTER — Other Ambulatory Visit (HOSPITAL_BASED_OUTPATIENT_CLINIC_OR_DEPARTMENT_OTHER): Payer: Medicare Other

## 2016-08-03 ENCOUNTER — Ambulatory Visit (HOSPITAL_BASED_OUTPATIENT_CLINIC_OR_DEPARTMENT_OTHER): Payer: Medicare Other | Admitting: Oncology

## 2016-08-03 VITALS — BP 146/67 | HR 79 | Temp 98.6°F | Resp 18 | Ht 62.0 in | Wt 220.0 lb

## 2016-08-03 DIAGNOSIS — Z853 Personal history of malignant neoplasm of breast: Secondary | ICD-10-CM | POA: Diagnosis not present

## 2016-08-03 DIAGNOSIS — M810 Age-related osteoporosis without current pathological fracture: Secondary | ICD-10-CM

## 2016-08-03 DIAGNOSIS — Z170 Estrogen receptor positive status [ER+]: Principal | ICD-10-CM

## 2016-08-03 DIAGNOSIS — C50411 Malignant neoplasm of upper-outer quadrant of right female breast: Secondary | ICD-10-CM

## 2016-08-03 LAB — CBC WITH DIFFERENTIAL/PLATELET
BASO%: 0.1 % (ref 0.0–2.0)
BASOS ABS: 0 10*3/uL (ref 0.0–0.1)
EOS ABS: 0.1 10*3/uL (ref 0.0–0.5)
EOS%: 0.8 % (ref 0.0–7.0)
HEMATOCRIT: 37.2 % (ref 34.8–46.6)
HEMOGLOBIN: 12.5 g/dL (ref 11.6–15.9)
LYMPH%: 24.4 % (ref 14.0–49.7)
MCH: 31.1 pg (ref 25.1–34.0)
MCHC: 33.6 g/dL (ref 31.5–36.0)
MCV: 92.5 fL (ref 79.5–101.0)
MONO#: 0.6 10*3/uL (ref 0.1–0.9)
MONO%: 7.7 % (ref 0.0–14.0)
NEUT#: 5.1 10*3/uL (ref 1.5–6.5)
NEUT%: 67 % (ref 38.4–76.8)
PLATELETS: 205 10*3/uL (ref 145–400)
RBC: 4.02 10*6/uL (ref 3.70–5.45)
RDW: 13.7 % (ref 11.2–14.5)
WBC: 7.6 10*3/uL (ref 3.9–10.3)
lymph#: 1.9 10*3/uL (ref 0.9–3.3)

## 2016-08-03 LAB — COMPREHENSIVE METABOLIC PANEL
ALBUMIN: 3.4 g/dL — AB (ref 3.5–5.0)
ALK PHOS: 71 U/L (ref 40–150)
ALT: 30 U/L (ref 0–55)
ANION GAP: 12 meq/L — AB (ref 3–11)
AST: 20 U/L (ref 5–34)
BILIRUBIN TOTAL: 0.3 mg/dL (ref 0.20–1.20)
BUN: 29.5 mg/dL — ABNORMAL HIGH (ref 7.0–26.0)
CALCIUM: 10 mg/dL (ref 8.4–10.4)
CO2: 23 mEq/L (ref 22–29)
Chloride: 105 mEq/L (ref 98–109)
Creatinine: 1.2 mg/dL — ABNORMAL HIGH (ref 0.6–1.1)
EGFR: 46 mL/min/{1.73_m2} — AB (ref >90)
Glucose: 437 mg/dl — ABNORMAL HIGH (ref 70–140)
POTASSIUM: 4.7 meq/L (ref 3.5–5.1)
SODIUM: 139 meq/L (ref 136–145)
TOTAL PROTEIN: 7.3 g/dL (ref 6.4–8.3)

## 2016-08-03 NOTE — Progress Notes (Signed)
[facility]  [phone] Fax:(336) [phone]  OFFICE PROGRESS NOTE   ID: Christy [name]   DOB: 07-19-1941  MR#: [id number]  XVQ#:[id number]  Pcp Not In System  PCP: York Ram, MD SU: Rolm Bookbinder, M.D. RAD ONC: Jodelle Gross., M.D.  CHIEF COMPLAINT: hx right breast cancer  CURRENT TREATMENT: Completed 5 years of anastrozole  BREAST CANCER HISTORY: From Dr. Julien Girt new patient evaluation note dated 04/29/2010:  "This is a pleasant 75 year old woman here today with an interpreter, as she speaks [name], for discussion of recent diagnosis of breast cancer.  This patient has not had a recent mammogram.  Circumstances of her recent detection of breast cancer are a little unclear.  It would appear that she may have self detected a mass in her right breast.  She had a diagnostic screening mammogram on 03/28/2010, which showed a possible mass in the right breast with calcifications.  Diagnostic right mammogram was also performed.  A biopsy was performed on 04/04/2010.  Pathology indicated an invasive ductal carcinoma, tumor necrosis with lymphatic invasion was present.  ER and PR positive at 100% and 8% respectively, proliferative index 37%.  HER-2 was not amplified at a ratio of 1.22.  The patient has been seen by Dr. Donne Hazel, who in turn has referred her for possible neoadjuvant therapy.  She has also been seen by Dr. Lisbeth Renshaw.  An ultrasound was performed of the mass, which was felt to be 4.4 x 1.2 x 1.6 cm.  On physical examination this is difficult to detect.  An MRI scan performed 04/11/2010 showed a mass measuring 2.8 x 1.6 x 1.7 cm in the right breast, upper outer quadrant.  The patient has been seen in consultation by Dr. Lisbeth Renshaw for consideration of possible radiation.  She was felt to be a good candidate for radiation therapy."  Her subsequent history is as detailed below.  INTERVAL HISTORY: Christy [name] returns today for follow up of her estrogen receptor positive  breast cancer, accompanied by her daughter and daughter-in-law. She completed anastrozole for 5 years and is now off the medication. She has not noted any symptoms improving or worsening or any change at all since going off the treatment.  REVIEW OF SYSTEMS:  denies pain. She tells me she is likely to die in January, and the family tells me that is what she says every year. If she makes it through January she figured she lived through that until the next January. At any rate aside from some aches and pains which are related to her year of birth there are no new symptoms to report  PAST MEDICAL HISTORY: Past Medical History:  Diagnosis Date  . Abnormal ECG    Inferior Q waves  . Cancer ([place])    stage II right breast cancer  . Cataract   . Diabetes mellitus   . Hypercholesterolemia   . Hyperlipidemia   . Hypertension   . Osteoporosis   . Overactive bladder     PAST SURGICAL HISTORY: Past Surgical History:  Procedure Laterality Date  . BREAST SURGERY     right lumpectomy, sentinel node biopsy  . CATARACT EXTRACTION Bilateral 2008  . FRACTURE SURGERY     ORIF right distal radius fx  . JOINT REPLACEMENT     right hip  . PORT-A-CATH REMOVAL  09/11/2011   Procedure: REMOVAL PORT-A-CATH;  Surgeon: Rolm Bookbinder, MD;  Location: [facility];  Service: General;  Laterality: Left;  local   . PORTACATH PLACEMENT  FAMILY HISTORY Family History  Problem Relation Age of Onset  . Hypertension Sister   Both parents deceased, unclear causes.  One sister, three brothers.  All live in [place].   GYNECOLOGIC HISTORY: G3, P3.  Menopause at age 1.  No history of hormone replacement therapy.     SOCIAL HISTORY: The patient is a widow who has an adult son that lives with her.  [name] was married for 13 years.    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 She has been in the [place] for over  10 years.  She has 3 children here, one born in 1973, 1976, and 22.  Two are married.  She has 3 grandchildren.  In her spare time she enjoys watching TV, going with her friends out for coffee, and attending church.   ADVANCED DIRECTIVES: Not on file  HEALTH MAINTENANCE: Social History  Substance Use Topics  . Smoking status: Former Research scientist (life sciences)  . Smokeless tobacco: Never Used     Comment: quit 2009  . Alcohol use No    Colonoscopy: Not on file PAP: Not on file Bone density: The patient's last bone density scan on 08/01/2012 showed a T score of -3.2 (osteoporosis). Lipid panel: Not on file  No Known Allergies  Current Outpatient Prescriptions  Medication Sig Dispense Refill  . alendronate (FOSAMAX) 70 MG tablet Take 1 tablet (70 mg total) by mouth every 7 (seven) days. Take with a full glass of water on an empty stomach. 12 tablet 8  . Calcium Carb-Cholecalciferol (CALCIUM 1000 + D PO) Take 1 tablet by mouth daily.     . Cholecalciferol (VITAMIN D-3) 1000 UNITS CAPS Take 1 capsule by mouth daily.    . fenofibrate (TRICOR) 48 MG tablet Take 48 mg by mouth daily.    . furosemide (LASIX) 20 MG tablet Take 20 mg in the morning, in addition to your nightly dose. 20 mg twice a day for 5 days, then resume 20 daily 5 tablet 0  . lisinopril (PRINIVIL,ZESTRIL) 40 MG tablet Take 40 mg by mouth daily.    . metFORMIN (GLUCOPHAGE-XR) 500 MG 24 hr tablet Take 1,000 mg by mouth 2 (two) times daily.    . NORVASC 5 MG tablet Take 5 mg by mouth daily.     . simvastatin (ZOCOR) 40 MG tablet Take 40 mg by mouth daily.    . VESICARE 10 MG tablet Take 10 mg by mouth daily.     . furosemide (LASIX) 40 MG tablet Take 40 mg by mouth daily.    [name] ibuprofen (ADVIL,MOTRIN) 800 MG tablet Take 800 mg by mouth every 8 (eight) hours as needed for fever or moderate pain.     [name] insulin detemir (LEVEMIR) 100 UNIT/ML injection Inject 30 Units into the skin at bedtime.    . potassium chloride (K-DUR) 10 MEQ tablet Take 1  tablet (10 mEq total) by mouth 2 (two) times daily. (Patient not taking: Reported on 08/03/2016) 30 tablet 0   No current facility-administered medications for this visit.     OBJECTIVE: Older white woman who appears older than stated age 66:   08/03/16 1538  BP: (!) 146/67  Pulse: 79  Resp: 18  Temp: 98.6 F (37 C)     Body mass index is 40.24 kg/m.      ECOG FS: 2 - Symptomatic, <50% confined to bed  Sclerae unicteric, EOMs intact Oropharynx clear and moist No cervical  or supraclavicular adenopathy Lungs no rales or rhonchi Heart regular rate and rhythm Abd soft, obese, nontender, positive bowel sounds MSK kyphosis but no focal spinal tenderness Neuro: nonfocal, well oriented, appropriate affect Breasts: The right breast is status post lumpectomy and radiation. There is no evidence of local recurrence. The right axilla is benign. Left breast is unremarkable.   LAB RESULTS: Lab Results  Component Value Date   WBC 7.6 08/03/2016   NEUTROABS 5.1 08/03/2016   HGB 12.5 08/03/2016   HCT 37.2 08/03/2016   MCV 92.5 08/03/2016   PLT 205 08/03/2016      Chemistry      Component Value Date/Time   NA 138 03/21/2016 1155   NA 141 10/02/2015 1426   K 4.5 03/21/2016 1155   K 4.2 10/02/2015 1426   CL 103 03/21/2016 1155   CL 103 03/21/2013 0934   CO2 24 03/21/2016 1155   CO2 26 10/02/2015 1426   BUN 27 (H) 03/21/2016 1155   BUN 20.1 10/02/2015 1426   CREATININE 1.16 (H) 03/21/2016 1155   CREATININE 1.8 (H) 10/02/2015 1426      Component Value Date/Time   CALCIUM 10.4 (H) 03/21/2016 1155   CALCIUM 10.2 10/02/2015 1426   ALKPHOS 53 10/02/2015 1426   AST 14 10/02/2015 1426   ALT 16 10/02/2015 1426   BILITOT <0.30 10/02/2015 1426       Lab Results  Component Value Date   LABCA2 25 01/22/2012    Urinalysis    Component Value Date/Time   COLORURINE YELLOW 11/27/2013 2012   APPEARANCEUR CLEAR 11/27/2013 2012   LABSPEC 1.017 11/27/2013 2012   PHURINE 5.0  11/27/2013 2012   GLUCOSEU 250 (A) 11/27/2013 2012   HGBUR NEGATIVE 11/27/2013 2012   [place] NEGATIVE 11/27/2013 2012   [place] NEGATIVE 11/27/2013 2012   PROTEINUR NEGATIVE 11/27/2013 2012   UROBILINOGEN 0.2 11/27/2013 2012   NITRITE NEGATIVE 11/27/2013 2012   LEUKOCYTESUR NEGATIVE 11/27/2013 2012    STUDIES: Repeat mammography due in November of this year at the [facility]: 75 y.o. [place] woman:  1.  Status post  right breast upper outer quadrant lumpectomy with right axillary sentinel node biopsy on 05/15/2010 for a pT1c pN1a, stage IIA, invasive ductal carcinoma, grade 3, estrogen receptor 100%, progesterone receptor 80%, MIB-1 of 37%, HER-2/neu by CISH no amplification; margins negative but close  2.  Oncotype DX report dated 06/03/2010 showed a breast cancer recurrence score of 43 with an average rate of distant recurrence of 30% if the patient's only systemic treatment was tamoxifen for 5 years.  3.   Status post breast lumpectomy for right posterior margin reexcision on 07/02/2010, with no residual cancer noted  6.  Status post adjuvant chemotherapy with cyclophosphamide, methotrexate and fluorouracil ("CMF") x 6 cycles from 07/30/2010 through 11/12/2010.  7.  Status post adjuvant radiation therapy from 12/11/2010 through 01/26/2011.  8.  The patient started antiestrogen therapy with anastrozole on 01/2011, stopped May 2017   9.  osteoporosis, on alendronate weekly  PLAN: Anjanette Has completed 5 years of anti-estrogen and is now ready to "graduate" from follow-up here. At this point I'm comfortable referring her back to her primary care physician  All she will need as far as breast cancer screening is concern is yearly mammography and a yearly physician breast exam.  I will be gone to see her at any point in the future if on when the need arises, but as of now we are planning no further routine appointments  for her here on a right or distortion  in  Chauncey Cruel, MD  08/03/2016, 3:58 PM

## 2016-10-12 ENCOUNTER — Emergency Department (HOSPITAL_BASED_OUTPATIENT_CLINIC_OR_DEPARTMENT_OTHER)
Admission: EM | Admit: 2016-10-12 | Discharge: 2016-10-12 | Disposition: A | Discharge status: Home / Self Care | Payer: Medicare Other | Attending: Emergency Medicine | Admitting: Emergency Medicine

## 2016-10-12 ENCOUNTER — Emergency Department (HOSPITAL_BASED_OUTPATIENT_CLINIC_OR_DEPARTMENT_OTHER): Payer: Medicare Other

## 2016-10-12 ENCOUNTER — Encounter (HOSPITAL_BASED_OUTPATIENT_CLINIC_OR_DEPARTMENT_OTHER): Payer: Self-pay | Admitting: *Deleted

## 2016-10-12 DIAGNOSIS — Y92009 Unspecified place in unspecified non-institutional (private) residence as the place of occurrence of the external cause: Secondary | ICD-10-CM | POA: Diagnosis not present

## 2016-10-12 DIAGNOSIS — Y939 Activity, unspecified: Secondary | ICD-10-CM | POA: Diagnosis not present

## 2016-10-12 DIAGNOSIS — Z79899 Other long term (current) drug therapy: Secondary | ICD-10-CM | POA: Diagnosis not present

## 2016-10-12 DIAGNOSIS — Z853 Personal history of malignant neoplasm of breast: Secondary | ICD-10-CM | POA: Insufficient documentation

## 2016-10-12 DIAGNOSIS — I10 Essential (primary) hypertension: Secondary | ICD-10-CM | POA: Insufficient documentation

## 2016-10-12 DIAGNOSIS — M25562 Pain in left knee: Secondary | ICD-10-CM | POA: Insufficient documentation

## 2016-10-12 DIAGNOSIS — E119 Type 2 diabetes mellitus without complications: Secondary | ICD-10-CM | POA: Diagnosis not present

## 2016-10-12 DIAGNOSIS — Z794 Long term (current) use of insulin: Secondary | ICD-10-CM | POA: Diagnosis not present

## 2016-10-12 DIAGNOSIS — R531 Weakness: Secondary | ICD-10-CM | POA: Diagnosis not present

## 2016-10-12 DIAGNOSIS — Z87891 Personal history of nicotine dependence: Secondary | ICD-10-CM | POA: Diagnosis not present

## 2016-10-12 DIAGNOSIS — S8992XA Unspecified injury of left lower leg, initial encounter: Secondary | ICD-10-CM | POA: Diagnosis present

## 2016-10-12 DIAGNOSIS — Y999 Unspecified external cause status: Secondary | ICD-10-CM | POA: Insufficient documentation

## 2016-10-12 DIAGNOSIS — W06XXXA Fall from bed, initial encounter: Secondary | ICD-10-CM | POA: Diagnosis not present

## 2016-10-12 DIAGNOSIS — W19XXXA Unspecified fall, initial encounter: Secondary | ICD-10-CM

## 2016-10-12 LAB — CBC WITH DIFFERENTIAL/PLATELET
BASOS ABS: 0 10*3/uL (ref 0.0–0.1)
BASOS PCT: 0 %
EOS ABS: 0.1 10*3/uL (ref 0.0–0.7)
EOS PCT: 1 %
HEMATOCRIT: 39.5 % (ref 36.0–46.0)
Hemoglobin: 13.1 g/dL (ref 12.0–15.0)
Lymphocytes Relative: 22 %
Lymphs Abs: 2 10*3/uL (ref 0.7–4.0)
MCH: 30.8 pg (ref 26.0–34.0)
MCHC: 33.2 g/dL (ref 30.0–36.0)
MCV: 92.7 fL (ref 78.0–100.0)
MONO ABS: 0.9 10*3/uL (ref 0.1–1.0)
MONOS PCT: 10 %
NEUTROS ABS: 5.9 10*3/uL (ref 1.7–7.7)
Neutrophils Relative %: 67 %
PLATELETS: 220 10*3/uL (ref 150–400)
RBC: 4.26 MIL/uL (ref 3.87–5.11)
RDW: 13 % (ref 11.5–15.5)
WBC: 8.8 10*3/uL (ref 4.0–10.5)

## 2016-10-12 LAB — URINALYSIS, ROUTINE W REFLEX MICROSCOPIC
Glucose, UA: 100 mg/dL — AB
HGB URINE DIPSTICK: NEGATIVE
KETONES UR: NEGATIVE mg/dL
Nitrite: NEGATIVE
Protein, ur: NEGATIVE mg/dL
SPECIFIC GRAVITY, URINE: 1.011 (ref 1.005–1.030)
pH: 5 (ref 5.0–8.0)

## 2016-10-12 LAB — COMPREHENSIVE METABOLIC PANEL
ALBUMIN: 4 g/dL (ref 3.5–5.0)
ALK PHOS: 51 U/L (ref 38–126)
ALT: 20 U/L (ref 14–54)
AST: 22 U/L (ref 15–41)
Anion gap: 11 (ref 5–15)
BILIRUBIN TOTAL: 0.5 mg/dL (ref 0.3–1.2)
BUN: 45 mg/dL — AB (ref 6–20)
CALCIUM: 9.9 mg/dL (ref 8.9–10.3)
CO2: 22 mmol/L (ref 22–32)
CREATININE: 1.91 mg/dL — AB (ref 0.44–1.00)
Chloride: 102 mmol/L (ref 101–111)
GFR calc Af Amer: 28 mL/min — ABNORMAL LOW (ref >60)
GFR calc non Af Amer: 24 mL/min — ABNORMAL LOW (ref >60)
GLUCOSE: 125 mg/dL — AB (ref 65–99)
Potassium: 4.4 mmol/L (ref 3.5–5.1)
SODIUM: 135 mmol/L (ref 135–145)
TOTAL PROTEIN: 7.3 g/dL (ref 6.5–8.1)

## 2016-10-12 LAB — URINALYSIS, MICROSCOPIC (REFLEX)

## 2016-10-12 LAB — TROPONIN I: Troponin I: <0.03 ng/mL (ref <0.03)

## 2016-10-12 MED ORDER — ACETAMINOPHEN 325 MG PO TABS
650.0000 mg | ORAL_TABLET | Freq: Once | ORAL | Status: AC
  Administered 2016-10-12: 650 mg via ORAL
  Filled 2016-10-12: qty 2

## 2016-10-12 NOTE — ED Provider Notes (Signed)
[facility] DEPT [facility] Provider Note   CSN: [id number] Arrival date & time: 10/12/16  1803  By signing my name below, I, Christy [name], attest that this documentation has been prepared under the direction and in the presence of Christy [name], [place]. Electronically Signed: Neta [name], ED Scribe. 10/12/2016. 7:20 PM.  Patient speaks no English history is obtained from professional medical interpreter using language line and from her son who accompanies her  History   Chief Complaint Chief Complaint  Patient presents with  . Knee Injury    The history is provided by the patient. The history is limited by a language barrier. A language interpreter was used.   HPI Comments:  Christy [name] is a 75 y.o. female with PMHx of DM, HLD and HTN who presents to the Emergency Department with her son with complaint of constant left knee pain due to a fall that occurred 7 days agoWhen she fell out of bed. Pt reports that she fell out of her bed. Pt complains of associated weakness x 7 days. Pt is ambulatory but has to support herself on furniture to walk. [name] son sates that pt does not have a PCP at this time and is applying to see a new doctor. No alleviating factors noted. Pt denies any other associated complains. No treatment prior to coming here. Pain is worse with walking  Improved with remaining still. No hip pain no chest pain no shortness breath no fever. No other associated symptoms  Past Medical History:  Diagnosis Date  . Abnormal ECG    Inferior Q waves  . Cancer ([place])    stage II right breast cancer  . Cataract   . Diabetes mellitus   . Hypercholesterolemia   . Hyperlipidemia   . Hypertension   . Osteoporosis   . Overactive bladder     Patient Active Problem List   Diagnosis Date Noted  . Elevated serum creatinine 08/07/2014  . Elevated BUN 08/07/2014  . Uncontrolled type II diabetes mellitus ([place]) 08/07/2014  . Osteoporosis 08/07/2014  . Breast cancer of  upper-outer quadrant of right female breast ([place]) 08/07/2013  . Abnormal EKG 08/21/2011    Past Surgical History:  Procedure Laterality Date  . BREAST SURGERY     right lumpectomy, sentinel node biopsy  . CATARACT EXTRACTION Bilateral 2008  . FRACTURE SURGERY     ORIF right distal radius fx  . JOINT REPLACEMENT     right hip  . PORT-A-CATH REMOVAL  09/11/2011   Procedure: REMOVAL PORT-A-CATH;  Surgeon: Rolm Bookbinder, MD;  Location: [facility];  Service: General;  Laterality: Left;  local   . PORTACATH PLACEMENT      OB History    No data available       Home Medications    Prior to Admission medications   Medication Sig Start Date End Date Taking? Authorizing Provider  alendronate (FOSAMAX) 70 MG tablet Take 1 tablet (70 mg total) by mouth every 7 (seven) days. Take with a full glass of water on an empty stomach. 08/07/13   Chauncey Cruel, MD  Calcium Carb-Cholecalciferol (CALCIUM 1000 + D PO) Take 1 tablet by mouth daily.     Historical Provider, MD  Cholecalciferol (VITAMIN D-3) 1000 UNITS CAPS Take 1 capsule by mouth daily.    Historical Provider, MD  fenofibrate (TRICOR) 48 MG tablet Take 48 mg by mouth daily.    Historical Provider, MD  furosemide (LASIX) 20 MG tablet Take 20 mg in the  morning, in addition to your nightly dose. 20 mg twice a day for 5 days, then resume 20 daily 03/21/16   Tanna Furry, MD  furosemide (LASIX) 40 MG tablet Take 40 mg by mouth daily.    Historical Provider, MD  ibuprofen (ADVIL,MOTRIN) 800 MG tablet Take 800 mg by mouth every 8 (eight) hours as needed for fever or moderate pain.  02/28/13   Historical Provider, MD  insulin detemir (LEVEMIR) 100 UNIT/ML injection Inject 30 Units into the skin at bedtime.    Historical Provider, MD  lisinopril (PRINIVIL,ZESTRIL) 40 MG tablet Take 40 mg by mouth daily.    Historical Provider, MD  metFORMIN (GLUCOPHAGE-XR) 500 MG 24 hr tablet Take 1,000 mg by mouth 2 (two) times daily.    Historical  Provider, MD  NORVASC 5 MG tablet Take 5 mg by mouth daily.  08/20/12   Historical Provider, MD  potassium chloride (K-DUR) 10 MEQ tablet Take 1 tablet (10 mEq total) by mouth 2 (two) times daily. Patient not taking: Reported on 08/03/2016 03/21/16   Tanna Furry, MD  simvastatin (ZOCOR) 40 MG tablet Take 40 mg by mouth daily.    Historical Provider, MD  VESICARE 10 MG tablet Take 10 mg by mouth daily.  08/20/12   Historical Provider, MD    Family History Family History  Problem Relation Age of Onset  . Hypertension Sister     Social History Social History  Substance Use Topics  . Smoking status: Former Research scientist (life sciences)  . Smokeless tobacco: Never Used     Comment: quit 2009  . Alcohol use No     Allergies   Patient has no known allergies.   Review of Systems Review of Systems  HENT: Negative.   Respiratory: Negative.   Cardiovascular: Negative.   Gastrointestinal: Negative.   Musculoskeletal: Positive for gait problem.       Walks with cane  Skin: Negative.   Neurological: Positive for weakness.       Generalized weakness  Psychiatric/Behavioral: Negative.   All other systems reviewed and are negative.    Physical Exam Updated Vital Signs BP (!) 96/49   Pulse 92   Temp 98.2 F (36.8 C)   Resp 18   Ht 5\' 8"  (1.727 m)   Wt 220 lb (99.8 kg)   SpO2 97%   BMI 33.45 kg/m   Physical Exam  Constitutional: She appears well-developed and well-nourished. No distress.  HENT:  Head: Normocephalic and atraumatic.  Eyes: Conjunctivae are normal. Pupils are equal, round, and reactive to light.  Neck: Neck supple. No tracheal deviation present. No thyromegaly present.  Cardiovascular: Normal rate and regular rhythm.   No murmur heard. Pulmonary/Chest: Effort normal and breath sounds normal.  Abdominal: Soft. Bowel sounds are normal. She exhibits no distension. There is no tenderness.  Obese  Musculoskeletal: Normal range of motion. She exhibits no edema or tenderness.  Left  lower extremity with without swelling or deformity. She is tender at proximal shin immediately inferior to knee. No ligamentous laxity of knee. DP pulses 2+ bilaterally. Pelvis stable nontender. She has no pain on internal or external rotation of either thigh. All other extremities no contusion abrasion or tenderness neurovascularly intact  Neurological: She is alert. Coordination normal.  Skin: Skin is warm and dry. No rash noted.  Psychiatric: She has a normal mood and affect.  Nursing note and vitals reviewed.    ED Treatments / Results  DIAGNOSTIC STUDIES: Results for orders placed or performed during the hospital encounter  of 10/12/16  Comprehensive metabolic panel  Result Value Ref Range   Sodium 135 135 - 145 mmol/L   Potassium 4.4 3.5 - 5.1 mmol/L   Chloride 102 101 - 111 mmol/L   CO2 22 22 - 32 mmol/L   Glucose, Bld 125 (H) 65 - 99 mg/dL   BUN 45 (H) 6 - 20 mg/dL   Creatinine, Ser 1.91 (H) 0.44 - 1.00 mg/dL   Calcium 9.9 8.9 - 10.3 mg/dL   Total Protein 7.3 6.5 - 8.1 g/dL   Albumin 4.0 3.5 - 5.0 g/dL   AST 22 15 - 41 U/L   ALT 20 14 - 54 U/L   Alkaline Phosphatase 51 38 - 126 U/L   Total Bilirubin 0.5 0.3 - 1.2 mg/dL   GFR calc non Af Amer 24 (L) >60 mL/min   GFR calc Af Amer 28 (L) >60 mL/min   Anion gap 11 5 - 15  CBC with Differential/Platelet  Result Value Ref Range   WBC 8.8 4.0 - 10.5 K/uL   RBC 4.26 3.87 - 5.11 MIL/uL   Hemoglobin 13.1 12.0 - 15.0 g/dL   HCT 39.5 36.0 - 46.0 %   MCV 92.7 78.0 - 100.0 fL   MCH 30.8 26.0 - 34.0 pg   MCHC 33.2 30.0 - 36.0 g/dL   RDW 13.0 11.5 - 15.5 %   Platelets 220 150 - 400 K/uL   Neutrophils Relative % 67 %   Neutro Abs 5.9 1.7 - 7.7 K/uL   Lymphocytes Relative 22 %   Lymphs Abs 2.0 0.7 - 4.0 K/uL   Monocytes Relative 10 %   Monocytes Absolute 0.9 0.1 - 1.0 K/uL   Eosinophils Relative 1 %   Eosinophils Absolute 0.1 0.0 - 0.7 K/uL   Basophils Relative 0 %   Basophils Absolute 0.0 0.0 - 0.1 K/uL  Troponin I  Result  Value Ref Range   Troponin I <0.03 <0.03 ng/mL  Urinalysis, Routine w reflex microscopic  Result Value Ref Range   Color, Urine YELLOW YELLOW   APPearance CLOUDY (A) CLEAR   Specific Gravity, Urine 1.011 1.005 - 1.030   pH 5.0 5.0 - 8.0   Glucose, UA 100 (A) NEGATIVE mg/dL   Hgb urine dipstick NEGATIVE NEGATIVE   Bilirubin Urine SMALL (A) NEGATIVE   Ketones, ur NEGATIVE NEGATIVE mg/dL   Protein, ur NEGATIVE NEGATIVE mg/dL   Nitrite NEGATIVE NEGATIVE   Leukocytes, UA SMALL (A) NEGATIVE  Urinalysis, Microscopic (reflex)  Result Value Ref Range   RBC / HPF 0-5 0 - 5 RBC/hpf   WBC, UA 6-30 0 - 5 WBC/hpf   Bacteria, UA RARE (A) NONE SEEN   Squamous Epithelial / LPF 6-30 (A) NONE SEEN   Mucous PRESENT    Hyaline Casts, UA PRESENT    Dg Knee Complete 4 Views Left  Result Date: 10/12/2016 CLINICAL DATA:  [place] with left knee pain. EXAM: LEFT KNEE - COMPLETE 4+ VIEW COMPARISON:  06/11/2009 FINDINGS: No fracture.  No bone lesion. Knee joint is normally aligned.  No significant arthropathic change. The bones are diffusely demineralized. No joint effusion. There are vascular calcifications posteriorly. IMPRESSION: No fracture, dislocation or acute finding. Electronically Signed   By: Lajean Manes M.D.   On: 10/12/2016 18:45  X-ray viewed by me Oxygen Saturation is 97% on RA, normal by my interpretation.    COORDINATION OF CARE:  7:20 PM Discussed treatment plan with pt at bedside and pt agreed to plan.   Labs (all labs  ordered are listed, but only abnormal results are displayed) Labs Reviewed - No data to display  EKG  EKG Interpretation None       Radiology Dg Knee Complete 4 Views Left  Result Date: 10/12/2016 CLINICAL DATA:  [place] with left knee pain. EXAM: LEFT KNEE - COMPLETE 4+ VIEW COMPARISON:  06/11/2009 FINDINGS: No fracture.  No bone lesion. Knee joint is normally aligned.  No significant arthropathic change. The bones are diffusely demineralized. No joint effusion.  There are vascular calcifications posteriorly. IMPRESSION: No fracture, dislocation or acute finding. Electronically Signed   By: Lajean Manes M.D.   On: 10/12/2016 18:45    Procedures Procedures (including critical care time)  Medications Ordered in ED Medications - No data to display   Initial Impression / Assessment and Plan / ED Course  I have reviewed the triage vital signs and the nursing notes.  Pertinent labs & imaging results that were available during my care of the patient were reviewed by me and considered in my medical decision making (see chart for details).  Clinical Course   Final 5 PM feels improved after treatment with Tylenol. She is able to walk with her cane with minimal limp and pain is improved. She no longer feels weak.  Plan Tylenol as needed for pain. Her son reports that he is "applying" to get her new [facility] care physician. [facility]  Final Clinical Impressions(s) / ED Diagnoses  Diagnosis #1 fall #2 contusion to left knee #3 weakness Final diagnoses:  None    New Prescriptions New Prescriptions   No medications on file  I personally performed the services described in this documentation, which was scribed in my presence. The recorded information has been reviewed and considered.     Orlie Dakin, MD 10/12/16 2111

## 2016-10-12 NOTE — ED Triage Notes (Signed)
Pt c/o fall x 14 days ago c/o left knee pain

## 2016-10-12 NOTE — ED Notes (Signed)
Pt fell from her bed a week ago when she was trying to reach for her phone.

## 2016-10-12 NOTE — Discharge Instructions (Signed)
You can take Tylenol 650 mg every 4 hours as needed for pain. Make sure that you drink at least six eight ounce glasses of water each day in order to stay well-hydrated. Continue the application process to get a primary care physician or you can call the [facility]. Return if your condition worsens for any reason.

## 2016-10-14 LAB — URINE CULTURE

## 2016-12-01 ENCOUNTER — Other Ambulatory Visit: Payer: Self-pay | Admitting: Oncology

## 2016-12-01 DIAGNOSIS — Z853 Personal history of malignant neoplasm of breast: Secondary | ICD-10-CM

## 2016-12-08 ENCOUNTER — Ambulatory Visit: Payer: Medicare Other | Admitting: Internal Medicine

## 2016-12-27 ENCOUNTER — Encounter: Payer: Self-pay | Admitting: Family Medicine

## 2016-12-27 DIAGNOSIS — E785 Hyperlipidemia, unspecified: Secondary | ICD-10-CM | POA: Insufficient documentation

## 2016-12-27 DIAGNOSIS — I10 Essential (primary) hypertension: Secondary | ICD-10-CM | POA: Insufficient documentation

## 2016-12-30 ENCOUNTER — Ambulatory Visit
Admission: RE | Admit: 2016-12-30 | Discharge: 2016-12-30 | Disposition: A | Discharge status: Home / Self Care | Payer: Medicare Other | Source: Ambulatory Visit | Attending: Oncology | Admitting: Oncology

## 2016-12-30 DIAGNOSIS — Z853 Personal history of malignant neoplasm of breast: Secondary | ICD-10-CM

## 2016-12-31 ENCOUNTER — Ambulatory Visit (INDEPENDENT_AMBULATORY_CARE_PROVIDER_SITE_OTHER): Payer: Medicare Other | Admitting: Family Medicine

## 2016-12-31 ENCOUNTER — Encounter: Payer: Self-pay | Admitting: Family Medicine

## 2016-12-31 VITALS — BP 98/78 | HR 90 | Temp 98.4°F | Ht 68.0 in | Wt 219.8 lb

## 2016-12-31 DIAGNOSIS — E1165 Type 2 diabetes mellitus with hyperglycemia: Secondary | ICD-10-CM

## 2016-12-31 DIAGNOSIS — Z794 Long term (current) use of insulin: Secondary | ICD-10-CM

## 2016-12-31 DIAGNOSIS — E559 Vitamin D deficiency, unspecified: Secondary | ICD-10-CM | POA: Diagnosis not present

## 2016-12-31 DIAGNOSIS — Z7689 Persons encountering health services in other specified circumstances: Secondary | ICD-10-CM | POA: Diagnosis present

## 2016-12-31 DIAGNOSIS — E1122 Type 2 diabetes mellitus with diabetic chronic kidney disease: Secondary | ICD-10-CM | POA: Diagnosis not present

## 2016-12-31 DIAGNOSIS — M810 Age-related osteoporosis without current pathological fracture: Secondary | ICD-10-CM | POA: Diagnosis not present

## 2016-12-31 DIAGNOSIS — Z853 Personal history of malignant neoplasm of breast: Secondary | ICD-10-CM

## 2016-12-31 MED ORDER — INSULIN DETEMIR 100 UNIT/ML ~~LOC~~ SOLN: 20.0000 [IU] | Freq: Every day | SUBCUTANEOUS | 2 refills | Status: DC

## 2016-12-31 MED ORDER — AMLODIPINE BESYLATE 5 MG PO TABS: 5.0000 mg | ORAL_TABLET | Freq: Every day | ORAL | 1 refills | Status: DC

## 2016-12-31 MED ORDER — FUROSEMIDE 40 MG PO TABS
40.0000 mg | ORAL_TABLET | Freq: Every day | ORAL | 1 refills | Status: DC
Start: 2016-12-31 — End: 2017-07-08

## 2016-12-31 MED ORDER — SOLIFENACIN SUCCINATE 10 MG PO TABS: 10.0000 mg | ORAL_TABLET | Freq: Every day | ORAL | 1 refills | Status: DC

## 2016-12-31 MED ORDER — GLIMEPIRIDE 2 MG PO TABS: 2.0000 mg | ORAL_TABLET | Freq: Every day | ORAL | 1 refills | Status: DC

## 2016-12-31 MED ORDER — SIMVASTATIN 40 MG PO TABS: 40.0000 mg | ORAL_TABLET | Freq: Every day | ORAL | 0 refills | Status: DC

## 2016-12-31 MED ORDER — LISINOPRIL 40 MG PO TABS: 40.0000 mg | ORAL_TABLET | Freq: Every day | ORAL | 1 refills | Status: DC

## 2016-12-31 MED ORDER — ALENDRONATE SODIUM 70 MG PO TABS: 70.0000 mg | ORAL_TABLET | ORAL | 8 refills | Status: DC

## 2016-12-31 MED ORDER — METFORMIN HCL ER 500 MG PO TB24: 1000.0000 mg | ORAL_TABLET | Freq: Two times a day (BID) | ORAL | 1 refills | Status: DC

## 2016-12-31 MED ORDER — INSULIN ASPART 100 UNIT/ML ~~LOC~~ SOLN: 30.0000 [IU] | Freq: Once | SUBCUTANEOUS | 99 refills | Status: DC

## 2016-12-31 MED ORDER — FENOFIBRATE 48 MG PO TABS: 48.0000 mg | ORAL_TABLET | Freq: Every day | ORAL | 1 refills | Status: DC

## 2016-12-31 NOTE — Progress Notes (Signed)
   Subjective:    Patient ID: Christy [name], female    DOB: November 19, 1940, 76 y.o.   MRN: [id number]   CC: Establishing care with new physician  HPI: Christy [name] is a 76 yo female with a past medical history significant for DM type 2, hyperlipidemia, hypertension and osteoporosis and breast malignancy who present today to clinic to establish care with PCP. Patient is here today with daughter and and interpreter. Originally from [place]. Patient does not have any complaints today. She has been taking all her prescription as prescribed and her son has been make sure she does so. Patient recently had a mammogram that was normal.  Patient has requested refill on all her medications today.  Smoking status reviewed   ROS: all other systems were reviewed and are negative other than in the HPI   Past Medical History:  Diagnosis Date  . Abnormal ECG    Inferior Q waves  . Cancer ([place])    stage II right breast cancer  . Cataract   . Diabetes mellitus   . Hypercholesterolemia   . Hyperlipidemia   . Hypertension   . Osteoporosis   . Overactive bladder    Past Surgical History:  Procedure Laterality Date  . BREAST SURGERY     right lumpectomy, sentinel node biopsy  . CATARACT EXTRACTION Bilateral 2008  . FRACTURE SURGERY     ORIF right distal radius fx  . JOINT REPLACEMENT     right hip  . PORT-A-CATH REMOVAL  09/11/2011   Procedure: REMOVAL PORT-A-CATH;  Surgeon: Rolm Bookbinder, MD;  Location: [facility];  Service: General;  Laterality: Left;  local   . PORTACATH PLACEMENT      Objective:  BP 98/78 (BP Location: Left Arm, Patient Position: Sitting, Cuff Size: Normal)   Pulse 90   Temp 98.4 F (36.9 C) (Oral)   Ht 5\' 8"  (1.727 m)   Wt 219 lb 12.8 oz (99.7 kg)   SpO2 97%   BMI 33.42 kg/m   Vitals and nursing note reviewed  General: NAD, pleasant, able to participate in exam Cardiac: RRR, normal heart sounds, no murmurs. 2+ radial and PT pulses  bilaterally Respiratory: CTAB, normal effort, No wheezes, rales or rhonchi Abdomen: soft, nontender, nondistended, no hepatic or splenomegaly, +BS Extremities: no edema or cyanosis. WWP. Skin: warm and dry, no rashes noted Neuro: alert and oriented x4, no focal deficits Psych: Normal affect and mood   Assessment & Plan:   #Establishing care with PCP Patient appear to be doing well, multiple medical problems that appear to be well controlled at the moment. Physical exam is unremarkable. Patient has not had lab work in the past few years. Will order comprehensive blood work and will tailor treatment plan based on results. Patient could benefit from medication optimization. --Will order CBC, CMP, TSH, Lipid panel, A1c and Vit D level --Patient will follow up in three weeks to discuss results from lab work and make adjustment as needed.   [name], MD Family Medicine Resident PGY-1

## 2016-12-31 NOTE — Patient Instructions (Signed)
It was great seeing you today! We have addressed the following issues today  1. I refill all your medications as requested. It appears you have no complaint at the moment.  2. I want you to make an appointment for a lab visit so we can draw a CBC, CMP, lipid panel, TSH, Vit D level. 3. I want you make an appointment to see the eye doctor since you are a diabetic patient. You should do that on a yearly basis. 4. The results of your mammogram from yesterday were normal. 5. Make an appointment for next week for a lab visit and another appointment to see me in clinic towards the end of the month.  If we did any lab work today, and the results require attention, either me or my nurse will get in touch with you. If everything is normal, you will get a letter in mail and a message via . If you don't hear from [place] in two weeks, please give [place] a call. Otherwise, we look forward to seeing you again at your next visit. If you have any questions or concerns before then, please call the clinic at [phone].  Please bring all your medications to every doctors visit  Sign up for My Chart to have easy access to your labs results, and communication with your Primary care physician.    Please check-out at the front desk before leaving the clinic.    Take Care,   Dr. Cyndia Skeeters

## 2017-01-06 ENCOUNTER — Other Ambulatory Visit (INDEPENDENT_AMBULATORY_CARE_PROVIDER_SITE_OTHER): Payer: Medicare Other

## 2017-01-06 DIAGNOSIS — Z794 Long term (current) use of insulin: Secondary | ICD-10-CM | POA: Diagnosis not present

## 2017-01-06 DIAGNOSIS — E1122 Type 2 diabetes mellitus with diabetic chronic kidney disease: Secondary | ICD-10-CM | POA: Diagnosis present

## 2017-01-06 DIAGNOSIS — E1165 Type 2 diabetes mellitus with hyperglycemia: Secondary | ICD-10-CM | POA: Diagnosis not present

## 2017-01-06 DIAGNOSIS — E559 Vitamin D deficiency, unspecified: Secondary | ICD-10-CM

## 2017-01-06 LAB — CBC
HCT: 41.2 % (ref 35.0–45.0)
Hemoglobin: 13.4 g/dL (ref 11.7–15.5)
MCH: 30.8 pg (ref 27.0–33.0)
MCHC: 32.5 g/dL (ref 32.0–36.0)
MCV: 94.7 fL (ref 80.0–100.0)
MPV: 11.4 fL (ref 7.5–12.5)
PLATELETS: 208 10*3/uL (ref 140–400)
RBC: 4.35 MIL/uL (ref 3.80–5.10)
RDW: 13.8 % (ref 11.0–15.0)
WBC: 8.1 10*3/uL (ref 3.8–10.8)

## 2017-01-06 LAB — COMPLETE METABOLIC PANEL WITH GFR
ALT: 22 U/L (ref 6–29)
AST: 16 U/L (ref 10–35)
Albumin: 4.2 g/dL (ref 3.6–5.1)
Alkaline Phosphatase: 53 U/L (ref 33–130)
BUN: 16 mg/dL (ref 7–25)
CALCIUM: 9.7 mg/dL (ref 8.6–10.4)
CHLORIDE: 103 mmol/L (ref 98–110)
CO2: 26 mmol/L (ref 20–31)
Creat: 1.07 mg/dL — ABNORMAL HIGH (ref 0.60–0.93)
GFR, Est African American: 58 mL/min — ABNORMAL LOW (ref >=60)
GFR, Est Non African American: 51 mL/min — ABNORMAL LOW (ref >=60)
Glucose, Bld: 271 mg/dL — ABNORMAL HIGH (ref 65–99)
POTASSIUM: 4.6 mmol/L (ref 3.5–5.3)
SODIUM: 140 mmol/L (ref 135–146)
Total Bilirubin: 0.4 mg/dL (ref 0.2–1.2)
Total Protein: 7 g/dL (ref 6.1–8.1)

## 2017-01-06 LAB — LIPID PANEL
CHOL/HDL RATIO: 4.4 ratio (ref <5.0)
Cholesterol: 193 mg/dL (ref <200)
HDL: 44 mg/dL — AB (ref >50)
LDL Cholesterol: 107 mg/dL — ABNORMAL HIGH (ref <100)
TRIGLYCERIDES: 212 mg/dL — AB (ref <150)
VLDL: 42 mg/dL — ABNORMAL HIGH (ref <30)

## 2017-01-06 LAB — POCT GLYCOSYLATED HEMOGLOBIN (HGB A1C): Hemoglobin A1C: 9.5

## 2017-01-06 LAB — TSH: TSH: 3.73 m[IU]/L

## 2017-01-07 LAB — VITAMIN D 25 HYDROXY (VIT D DEFICIENCY, FRACTURES): Vit D, 25-Hydroxy: 30 ng/mL (ref 30–100)

## 2017-01-12 ENCOUNTER — Telehealth: Payer: Self-pay | Admitting: *Deleted

## 2017-01-12 DIAGNOSIS — Z7689 Persons encountering health services in other specified circumstances: Secondary | ICD-10-CM

## 2017-01-12 NOTE — Telephone Encounter (Signed)
Received fax from [facility] requesting to quantity of Metformin #60 to 90 day supply.  Please advise.  Derl Barrow, RN

## 2017-01-13 MED ORDER — METFORMIN HCL ER 500 MG PO TB24: 1000.0000 mg | ORAL_TABLET | Freq: Two times a day (BID) | ORAL | 1 refills | Status: DC

## 2017-01-22 ENCOUNTER — Ambulatory Visit (INDEPENDENT_AMBULATORY_CARE_PROVIDER_SITE_OTHER): Payer: Medicare Other | Admitting: Family Medicine

## 2017-01-22 ENCOUNTER — Encounter: Payer: Self-pay | Admitting: Family Medicine

## 2017-01-22 VITALS — BP 135/58 | HR 70 | Temp 98.4°F | Ht 68.0 in | Wt 217.0 lb

## 2017-01-22 DIAGNOSIS — Z09 Encounter for follow-up examination after completed treatment for conditions other than malignant neoplasm: Secondary | ICD-10-CM | POA: Diagnosis present

## 2017-01-22 NOTE — Patient Instructions (Addendum)
Please stop taking glimepiride and increase your Insulin Levemir from 20 units to 26 units given your elevated A1c   Diabetes Mellitus and Food It is important for you to manage your blood sugar (glucose) level. Your blood glucose level can be greatly affected by what you eat. Eating healthier foods in the appropriate amounts throughout the day at about the same time each day will help you control your blood glucose level. It can also help slow or prevent worsening of your diabetes mellitus. Healthy eating may even help you improve the level of your blood pressure and reach or maintain a healthy weight. General recommendations for healthful eating and cooking habits include:  Eating meals and snacks regularly. Avoid going long periods of time without eating to lose weight.  Eating a diet that consists mainly of plant-based foods, such as fruits, vegetables, nuts, legumes, and whole grains.  Using low-heat cooking methods, such as baking, instead of high-heat cooking methods, such as deep frying. Work with your dietitian to make sure you understand how to use the Nutrition Facts information on food labels. How can food affect me? Carbohydrates  Carbohydrates affect your blood glucose level more than any other type of food. Your dietitian will help you determine how many carbohydrates to eat at each meal and teach you how to count carbohydrates. Counting carbohydrates is important to keep your blood glucose at a healthy level, especially if you are using insulin or taking certain medicines for diabetes mellitus. Alcohol  Alcohol can cause sudden decreases in blood glucose (hypoglycemia), especially if you use insulin or take certain medicines for diabetes mellitus. Hypoglycemia can be a life-threatening condition. Symptoms of hypoglycemia (sleepiness, dizziness, and disorientation) are similar to symptoms of having too much alcohol. If your health care provider has given you approval to drink  alcohol, do so in moderation and use the following guidelines:  Women should not have more than one drink per day, and men should not have more than two drinks per day. One drink is equal to:  12 oz of beer.  5 oz of wine.  1 oz of hard liquor.  Do not drink on an empty stomach.  Keep yourself hydrated. Have water, diet soda, or unsweetened iced tea.  Regular soda, juice, and other mixers might contain a lot of carbohydrates and should be counted. What foods are not recommended? As you make food choices, it is important to remember that all foods are not the same. Some foods have fewer nutrients per serving than other foods, even though they might have the same number of calories or carbohydrates. It is difficult to get your body what it needs when you eat foods with fewer nutrients. Examples of foods that you should avoid that are high in calories and carbohydrates but low in nutrients include:  Trans fats (most processed foods list trans fats on the Nutrition Facts label).  Regular soda.  Juice.  Candy.  Sweets, such as cake, pie, doughnuts, and cookies.  Fried foods. What foods can I eat? Eat nutrient-rich foods, which will nourish your body and keep you healthy. The food you should eat also will depend on several factors, including:  The calories you need.  The medicines you take.  Your weight.  Your blood glucose level.  Your blood pressure level.  Your cholesterol level. You should eat a variety of foods, including:  Protein.  Lean cuts of meat.  Proteins low in saturated fats, such as fish, egg whites, and beans. Avoid processed  meats.  Fruits and vegetables.  Fruits and vegetables that may help control blood glucose levels, such as apples, mangoes, and yams.  Dairy products.  Choose fat-free or low-fat dairy products, such as milk, yogurt, and cheese.  Grains, bread, pasta, and rice.  Choose whole grain products, such as multigrain bread, whole  oats, and brown rice. These foods may help control blood pressure.  Fats.  Foods containing healthful fats, such as nuts, avocado, olive oil, canola oil, and fish. Does everyone with diabetes mellitus have the same meal plan? Because every person with diabetes mellitus is different, there is not one meal plan that works for everyone. It is very important that you meet with a dietitian who will help you create a meal plan that is just right for you. This information is not intended to replace advice given to you by your health care provider. Make sure you discuss any questions you have with your health care provider. Document Released: 07/16/2005 Document Revised: 03/26/2016 Document Reviewed: 09/15/2013 Elsevier Interactive Patient Education  2017 [employer] Inc.  Cholesterol Cholesterol is a white, waxy, fat-like substance that is needed by the human body in small amounts. The liver makes all the cholesterol we need. Cholesterol is carried from the liver by the blood through the blood vessels. Deposits of cholesterol (plaques) may build up on blood vessel (artery) walls. Plaques make the arteries narrower and stiffer. Cholesterol plaques increase the risk for heart attack and stroke. You cannot feel your cholesterol level even if it is very high. The only way to know that it is high is to have a blood test. Once you know your cholesterol levels, you should keep a record of the test results. Work with your health care provider to keep your levels in the desired range. What do the results mean?  Total cholesterol is a rough measure of all the cholesterol in your blood.  LDL (low-density lipoprotein) is the "bad" cholesterol. This is the type that causes plaque to build up on the artery walls. You want this level to be low.  HDL (high-density lipoprotein) is the "good" cholesterol because it cleans the arteries and carries the LDL away. You want this level to be high.  Triglycerides are fat that  the body can either burn for energy or store. High levels are closely linked to heart disease. What are the desired levels of cholesterol?  Total cholesterol below 200.  LDL below 100 for people who are at risk, below 70 for people at very high risk.  HDL above 40 is good. A level of 60 or higher is considered to be protective against heart disease.  Triglycerides below 150. How can I lower my cholesterol? Diet  Follow your diet program as told by your health care provider.  Choose fish or white meat chicken and [place], roasted or baked. Limit fatty cuts of red meat, fried foods, and processed meats, such as sausage and lunch meats.  Eat lots of fresh fruits and vegetables.  Choose whole grains, beans, pasta, potatoes, and cereals.  Choose olive oil, corn oil, or canola oil, and use only small amounts.  Avoid butter, mayonnaise, shortening, or palm kernel oils.  Avoid foods with trans fats.  Drink skim or nonfat milk and eat low-fat or nonfat yogurt and cheeses. Avoid whole milk, cream, ice cream, egg yolks, and full-fat cheeses.  Healthier desserts include angel food cake, ginger snaps, animal crackers, hard candy, popsicles, and low-fat or nonfat frozen yogurt. Avoid pastries, cakes, pies, and  cookies. Exercise  Follow your exercise program as told by your health care provider. A regular program:  Helps to decrease LDL and raise HDL.  Helps with weight control.  Do things that increase your activity level, such as gardening, walking, and taking the stairs.  Ask your health care provider about ways that you can be more active in your daily life. Medicine  Take over-the-counter and prescription medicines only as told by your health care provider.  Medicine may be prescribed by your health care provider to help lower cholesterol and decrease the risk for heart disease. This is usually done if diet and exercise have failed to bring down cholesterol levels.  If you have  several risk factors, you may need medicine even if your levels are normal. This information is not intended to replace advice given to you by your health care provider. Make sure you discuss any questions you have with your health care provider. Document Released: 07/14/2001 Document Revised: 05/16/2016 Document Reviewed: 04/18/2016 Elsevier Interactive Patient Education  2017 [employer].

## 2017-01-24 NOTE — Progress Notes (Signed)
Subjective:    Patient ID: Christy [name], female    DOB: 1941-03-26, 76 y.o.   MRN: [id number]   CC: Follow up after initial visit  HPI: Christy [name] is a 76 yo female with a past medical history significant for DM type 2, hyperlipidemia, hypertension and osteoporosis and breast malignancy who present today to follow up on laboratory test results. Patient has been doing well since last time she was seen in clinic, no specific complaints. Taking her medications as prescribed. Patient is accompanied by daughter and interpreter.  Smoking status reviewed   ROS: all other systems were reviewed and are negative other than in the HPI   Past Medical History:  Diagnosis Date  . Abnormal ECG    Inferior Q waves  . Cancer ([place])    stage II right breast cancer  . Cataract   . Diabetes mellitus   . Hypercholesterolemia   . Hyperlipidemia   . Hypertension   . Osteoporosis   . Overactive bladder    Past Surgical History:  Procedure Laterality Date  . BREAST SURGERY     right lumpectomy, sentinel node biopsy  . CATARACT EXTRACTION Bilateral 2008  . FRACTURE SURGERY     ORIF right distal radius fx  . JOINT REPLACEMENT     right hip  . PORT-A-CATH REMOVAL  09/11/2011   Procedure: REMOVAL PORT-A-CATH;  Surgeon: Rolm Bookbinder, MD;  Location: [facility];  Service: General;  Laterality: Left;  local   . PORTACATH PLACEMENT      Objective:  BP (!) 135/58   Pulse 70   Temp 98.4 F (36.9 C) (Oral)   Ht 5\' 8"  (1.727 m)   Wt 217 lb (98.4 kg)   SpO2 97%   BMI 32.99 kg/m   Vitals and nursing note reviewed  General: NAD, pleasant, able to participate in exam Cardiac: RRR, normal heart sounds, no murmurs. 2+ radial and PT pulses bilaterally Respiratory: CTAB, normal effort, No wheezes, rales or rhonchi Abdomen: soft, nontender, nondistended, no hepatic or splenomegaly, +BS Extremities: no edema or cyanosis. WWP.  Diabetic Foot Exam - Simple   Simple Foot  Form Diabetic Foot exam was performed with the following findings:  Yes 01/22/2017  3:30 PM  Visual Inspection Sensation Testing Pulse Check Comments Decrease sensation in left foot. Patient unable to tell which toe is being tested but is able to feel touch on her foot     Skin: warm and dry, no rashes noted Neuro: alert and oriented x4, no focal deficits Psych: Normal affect and mood   Assessment & Plan:   #T2DM, uncontrolled Patient A1c is 9.5. Last A1c on record was 9.2 in 2011. Patient and daughter have had DM diet education in the past and seem knowledgeable on what to do, however patient seem to not be interested in making the needed changes to her diet. She currently lives with her son. Patient is currently on levemir 20 units and glimepiride 2 mg daily. Given age and risk of hypoglycemia and elevated A1c will stop glimepiride and continue with insulin. --Increase levemir to 26 units daily from 20 --Continue Metformin 2000 mg daily --Will Stop Glimepiride --Given DM Diet hand out   #Hyperlipidemia CHolesterol 194, HDL 44, LDL107, triglycerides 212. Patient is currently on fenofibrate and moderate intensity statin. Numbers are reasonable given age however patient has uncontrolled diabetes and hypertension. ASCVD 10 year risk is 44.5% patient would benefit from high intensity statin. TLC discussed with patient and family members. Will  address again at next A1c check if unchanged. Goal LDL <70, Goal HDL>50 and Goal cholesterol <150. Will consider moderate intensity with ezetimibe or high intensity alone in addition to TLC. Fenofibrate and moderate intensity statin have not showed any CV benefit in patient with T2DM and increased risk factor. --Continue Simvastatin 40 mg daily  --Continue Fenofibrate 48 mg daily  #Hypertension Today 138/58 within acceptable range. Patient with limited motility and uses a cane. Would not want to aggresively treat though not at goal risk of fall and  serious injury in individual with prior hip surgery outweigh benefits. Kidney function adequate slightly elevated creatinine most likely secondary to uncontrollded DM and diuretic regimen. Patient currently on ACE-I for renal protection. K+ is 4.6, no concern at the moment. --Continue norvasc 5mg  daily  --Continue Lisinopril 40 mg daily --Continue Lasix 40 mg daily  #Osteoporosis Vit D level is within normal limit at 30. Continue current treatment --Alendronate 70 mg  Every 7 days --Calcium daily  --Vitamin D daily  [name], MD Family Medicine Resident PGY-1

## 2017-02-01 ENCOUNTER — Other Ambulatory Visit: Payer: Self-pay | Admitting: Family Medicine

## 2017-02-01 DIAGNOSIS — Z7689 Persons encountering health services in other specified circumstances: Secondary | ICD-10-CM

## 2017-03-28 ENCOUNTER — Emergency Department (HOSPITAL_COMMUNITY): Payer: Medicare Other

## 2017-03-28 ENCOUNTER — Inpatient Hospital Stay (HOSPITAL_COMMUNITY)
Admission: EM | Admit: 2017-03-28 | Discharge: 2017-04-05 | DRG: 488 | Disposition: A | Discharge status: Skilled Nursing Facility | Payer: Medicare Other | Attending: Family Medicine | Admitting: Family Medicine

## 2017-03-28 ENCOUNTER — Encounter (HOSPITAL_COMMUNITY): Payer: Self-pay

## 2017-03-28 DIAGNOSIS — W19XXXA Unspecified fall, initial encounter: Secondary | ICD-10-CM | POA: Diagnosis not present

## 2017-03-28 DIAGNOSIS — E11649 Type 2 diabetes mellitus with hypoglycemia without coma: Secondary | ICD-10-CM | POA: Diagnosis present

## 2017-03-28 DIAGNOSIS — Z853 Personal history of malignant neoplasm of breast: Secondary | ICD-10-CM

## 2017-03-28 DIAGNOSIS — S022XXA Fracture of nasal bones, initial encounter for closed fracture: Secondary | ICD-10-CM | POA: Diagnosis present

## 2017-03-28 DIAGNOSIS — M810 Age-related osteoporosis without current pathological fracture: Secondary | ICD-10-CM | POA: Diagnosis present

## 2017-03-28 DIAGNOSIS — I10 Essential (primary) hypertension: Secondary | ICD-10-CM | POA: Diagnosis present

## 2017-03-28 DIAGNOSIS — S52571A Other intraarticular fracture of lower end of right radius, initial encounter for closed fracture: Secondary | ICD-10-CM | POA: Diagnosis present

## 2017-03-28 DIAGNOSIS — Z23 Encounter for immunization: Secondary | ICD-10-CM | POA: Diagnosis not present

## 2017-03-28 DIAGNOSIS — R7989 Other specified abnormal findings of blood chemistry: Secondary | ICD-10-CM

## 2017-03-28 DIAGNOSIS — Y9222 Religious institution as the place of occurrence of the external cause: Secondary | ICD-10-CM

## 2017-03-28 DIAGNOSIS — N179 Acute kidney failure, unspecified: Secondary | ICD-10-CM | POA: Diagnosis present

## 2017-03-28 DIAGNOSIS — R778 Other specified abnormalities of plasma proteins: Secondary | ICD-10-CM

## 2017-03-28 DIAGNOSIS — E1122 Type 2 diabetes mellitus with diabetic chronic kidney disease: Secondary | ICD-10-CM | POA: Diagnosis present

## 2017-03-28 DIAGNOSIS — S82042A Displaced comminuted fracture of left patella, initial encounter for closed fracture: Secondary | ICD-10-CM

## 2017-03-28 DIAGNOSIS — I129 Hypertensive chronic kidney disease with stage 1 through stage 4 chronic kidney disease, or unspecified chronic kidney disease: Secondary | ICD-10-CM | POA: Diagnosis present

## 2017-03-28 DIAGNOSIS — Z7983 Long term (current) use of bisphosphonates: Secondary | ICD-10-CM | POA: Diagnosis not present

## 2017-03-28 DIAGNOSIS — E860 Dehydration: Secondary | ICD-10-CM | POA: Diagnosis not present

## 2017-03-28 DIAGNOSIS — N183 Chronic kidney disease, stage 3 (moderate): Secondary | ICD-10-CM | POA: Diagnosis not present

## 2017-03-28 DIAGNOSIS — R0902 Hypoxemia: Secondary | ICD-10-CM

## 2017-03-28 DIAGNOSIS — Y92414 Local residential or business street as the place of occurrence of the external cause: Secondary | ICD-10-CM | POA: Diagnosis not present

## 2017-03-28 DIAGNOSIS — Z01810 Encounter for preprocedural cardiovascular examination: Secondary | ICD-10-CM | POA: Diagnosis not present

## 2017-03-28 DIAGNOSIS — S02401A Maxillary fracture, unspecified, initial encounter for closed fracture: Secondary | ICD-10-CM | POA: Diagnosis present

## 2017-03-28 DIAGNOSIS — E559 Vitamin D deficiency, unspecified: Secondary | ICD-10-CM

## 2017-03-28 DIAGNOSIS — S52602A Unspecified fracture of lower end of left ulna, initial encounter for closed fracture: Secondary | ICD-10-CM | POA: Diagnosis present

## 2017-03-28 DIAGNOSIS — T07XXXA Unspecified multiple injuries, initial encounter: Secondary | ICD-10-CM | POA: Diagnosis present

## 2017-03-28 DIAGNOSIS — M8000XD Age-related osteoporosis with current pathological fracture, unspecified site, subsequent encounter for fracture with routine healing: Secondary | ICD-10-CM | POA: Diagnosis not present

## 2017-03-28 DIAGNOSIS — Z87891 Personal history of nicotine dependence: Secondary | ICD-10-CM | POA: Diagnosis not present

## 2017-03-28 DIAGNOSIS — R748 Abnormal levels of other serum enzymes: Secondary | ICD-10-CM | POA: Diagnosis present

## 2017-03-28 DIAGNOSIS — E1165 Type 2 diabetes mellitus with hyperglycemia: Secondary | ICD-10-CM | POA: Diagnosis not present

## 2017-03-28 DIAGNOSIS — D72829 Elevated white blood cell count, unspecified: Secondary | ICD-10-CM

## 2017-03-28 DIAGNOSIS — Z794 Long term (current) use of insulin: Secondary | ICD-10-CM | POA: Diagnosis not present

## 2017-03-28 DIAGNOSIS — I447 Left bundle-branch block, unspecified: Secondary | ICD-10-CM | POA: Diagnosis not present

## 2017-03-28 DIAGNOSIS — Y9301 Activity, walking, marching and hiking: Secondary | ICD-10-CM | POA: Diagnosis present

## 2017-03-28 DIAGNOSIS — W010XXA Fall on same level from slipping, tripping and stumbling without subsequent striking against object, initial encounter: Secondary | ICD-10-CM | POA: Diagnosis present

## 2017-03-28 DIAGNOSIS — E785 Hyperlipidemia, unspecified: Secondary | ICD-10-CM | POA: Diagnosis present

## 2017-03-28 DIAGNOSIS — S82032A Displaced transverse fracture of left patella, initial encounter for closed fracture: Principal | ICD-10-CM | POA: Diagnosis present

## 2017-03-28 DIAGNOSIS — D62 Acute posthemorrhagic anemia: Secondary | ICD-10-CM | POA: Diagnosis not present

## 2017-03-28 DIAGNOSIS — E119 Type 2 diabetes mellitus without complications: Secondary | ICD-10-CM | POA: Diagnosis present

## 2017-03-28 DIAGNOSIS — Z7689 Persons encountering health services in other specified circumstances: Secondary | ICD-10-CM

## 2017-03-28 DIAGNOSIS — S52572A Other intraarticular fracture of lower end of left radius, initial encounter for closed fracture: Secondary | ICD-10-CM | POA: Diagnosis present

## 2017-03-28 DIAGNOSIS — Z79899 Other long term (current) drug therapy: Secondary | ICD-10-CM | POA: Diagnosis not present

## 2017-03-28 DIAGNOSIS — Z96641 Presence of right artificial hip joint: Secondary | ICD-10-CM | POA: Diagnosis present

## 2017-03-28 DIAGNOSIS — Z7984 Long term (current) use of oral hypoglycemic drugs: Secondary | ICD-10-CM | POA: Diagnosis not present

## 2017-03-28 DIAGNOSIS — S52501A Unspecified fracture of the lower end of right radius, initial encounter for closed fracture: Secondary | ICD-10-CM | POA: Diagnosis not present

## 2017-03-28 DIAGNOSIS — M25562 Pain in left knee: Secondary | ICD-10-CM | POA: Diagnosis present

## 2017-03-28 LAB — I-STAT VENOUS BLOOD GAS, ED
Acid-Base Excess: 2 mmol/L (ref 0.0–2.0)
Bicarbonate: 26.9 mmol/L (ref 20.0–28.0)
O2 Saturation: 92 %
TCO2: 28 mmol/L (ref 0–100)
pCO2, Ven: 40.6 mmHg — ABNORMAL LOW (ref 44.0–60.0)
pH, Ven: 7.429 (ref 7.250–7.430)
pO2, Ven: 63 mmHg — ABNORMAL HIGH (ref 32.0–45.0)

## 2017-03-28 LAB — CBC WITH DIFFERENTIAL/PLATELET
BASOS ABS: 0 10*3/uL (ref 0.0–0.1)
BASOS PCT: 0 %
EOS PCT: 0 %
Eosinophils Absolute: 0 10*3/uL (ref 0.0–0.7)
HCT: 40.7 % (ref 36.0–46.0)
Hemoglobin: 13.5 g/dL (ref 12.0–15.0)
LYMPHS PCT: 8 %
Lymphs Abs: 1.1 10*3/uL (ref 0.7–4.0)
MCH: 30.7 pg (ref 26.0–34.0)
MCHC: 33.2 g/dL (ref 30.0–36.0)
MCV: 92.5 fL (ref 78.0–100.0)
MONO ABS: 0.8 10*3/uL (ref 0.1–1.0)
Monocytes Relative: 6 %
Neutro Abs: 11.6 10*3/uL — ABNORMAL HIGH (ref 1.7–7.7)
Neutrophils Relative %: 86 %
PLATELETS: 192 10*3/uL (ref 150–400)
RBC: 4.4 MIL/uL (ref 3.87–5.11)
RDW: 13.6 % (ref 11.5–15.5)
WBC: 13.5 10*3/uL — ABNORMAL HIGH (ref 4.0–10.5)

## 2017-03-28 LAB — PROTIME-INR
INR: 0.99
PROTHROMBIN TIME: 13.1 s (ref 11.4–15.2)

## 2017-03-28 LAB — CBG MONITORING, ED
GLUCOSE-CAPILLARY: 468 mg/dL — AB (ref 65–99)
Glucose-Capillary: 519 mg/dL (ref 65–99)
Glucose-Capillary: 487 mg/dL — ABNORMAL HIGH (ref 65–99)

## 2017-03-28 LAB — BASIC METABOLIC PANEL
Anion gap: 14 (ref 5–15)
BUN: 44 mg/dL — AB (ref 6–20)
CALCIUM: 9.7 mg/dL (ref 8.9–10.3)
CO2: 23 mmol/L (ref 22–32)
Chloride: 98 mmol/L — ABNORMAL LOW (ref 101–111)
Creatinine, Ser: 1.7 mg/dL — ABNORMAL HIGH (ref 0.44–1.00)
GFR calc Af Amer: 33 mL/min — ABNORMAL LOW (ref >60)
GFR, EST NON AFRICAN AMERICAN: 28 mL/min — AB (ref >60)
GLUCOSE: 546 mg/dL — AB (ref 65–99)
Potassium: 5.1 mmol/L (ref 3.5–5.1)
Sodium: 135 mmol/L (ref 135–145)

## 2017-03-28 LAB — TROPONIN I: TROPONIN I: 0.03 ng/mL — AB (ref <0.03)

## 2017-03-28 LAB — TYPE AND SCREEN
ABO/RH(D): B NEG
ANTIBODY SCREEN: NEGATIVE

## 2017-03-28 LAB — BRAIN NATRIURETIC PEPTIDE: B NATRIURETIC PEPTIDE 5: 14 pg/mL (ref 0.0–100.0)

## 2017-03-28 MED ORDER — SODIUM CHLORIDE 0.9 % IV SOLN
INTRAVENOUS | Status: DC
  Administered 2017-03-28: via INTRAVENOUS

## 2017-03-28 MED ORDER — FENTANYL CITRATE (PF) 100 MCG/2ML IJ SOLN
25.0000 ug | Freq: Once | INTRAMUSCULAR | Status: AC
  Administered 2017-03-29: 100 ug via INTRAVENOUS
  Administered 2017-03-29: 50 ug via INTRAVENOUS
  Filled 2017-03-28: qty 2

## 2017-03-28 MED ORDER — HYDROCODONE-ACETAMINOPHEN 5-325 MG PO TABS
1.0000 | ORAL_TABLET | Freq: Four times a day (QID) | ORAL | Status: DC | PRN
  Administered 2017-03-30 – 2017-03-31 (×2): 1 via ORAL
  Administered 2017-03-31: 2 via ORAL
  Administered 2017-04-01: 1 via ORAL
  Administered 2017-04-01 – 2017-04-03 (×5): 2 via ORAL
  Filled 2017-03-28: qty 1
  Filled 2017-03-28 (×2): qty 2
  Filled 2017-03-28: qty 1
  Filled 2017-03-28: qty 2
  Filled 2017-03-28: qty 1
  Filled 2017-03-28 (×4): qty 2

## 2017-03-28 MED ORDER — MIDAZOLAM HCL 2 MG/2ML IJ SOLN
1.0000 mg | Freq: Once | INTRAMUSCULAR | Status: DC
  Filled 2017-03-28 (×2): qty 2

## 2017-03-28 MED ORDER — LIDOCAINE-EPINEPHRINE (PF) 2 %-1:200000 IJ SOLN
10.0000 mL | Freq: Once | INTRAMUSCULAR | Status: AC
  Administered 2017-03-28: 10 mL via INTRADERMAL
  Filled 2017-03-28: qty 20

## 2017-03-28 MED ORDER — INSULIN ASPART 100 UNIT/ML ~~LOC~~ SOLN
10.0000 [IU] | Freq: Once | SUBCUTANEOUS | Status: AC
  Administered 2017-03-28: 10 [IU] via INTRAVENOUS
  Filled 2017-03-28: qty 1

## 2017-03-28 MED ORDER — CEFAZOLIN SODIUM-DEXTROSE 2-4 GM/100ML-% IV SOLN
2.0000 g | Freq: Once | INTRAVENOUS | Status: AC
  Administered 2017-03-28: 2 g via INTRAVENOUS
  Filled 2017-03-28: qty 100

## 2017-03-28 MED ORDER — PROMETHAZINE HCL 25 MG/ML IJ SOLN
12.5000 mg | Freq: Four times a day (QID) | INTRAMUSCULAR | Status: DC | PRN
  Administered 2017-03-28: 12.5 mg via INTRAVENOUS
  Filled 2017-03-28: qty 1

## 2017-03-28 MED ORDER — CHLORHEXIDINE GLUCONATE 4 % EX LIQD
60.0000 mL | Freq: Once | CUTANEOUS | Status: AC
  Administered 2017-03-28: 4 via TOPICAL
  Filled 2017-03-28: qty 15

## 2017-03-28 MED ORDER — TETANUS-DIPHTH-ACELL PERTUSSIS 5-2.5-18.5 LF-MCG/0.5 IM SUSP
0.5000 mL | Freq: Once | INTRAMUSCULAR | Status: AC
  Administered 2017-03-28: 0.5 mL via INTRAMUSCULAR
  Filled 2017-03-28: qty 0.5

## 2017-03-28 MED ORDER — FENTANYL CITRATE (PF) 100 MCG/2ML IJ SOLN
25.0000 ug | INTRAMUSCULAR | Status: DC | PRN
  Administered 2017-03-30: 50 ug via INTRAVENOUS
  Filled 2017-03-28: qty 2

## 2017-03-28 MED ORDER — FENTANYL CITRATE (PF) 100 MCG/2ML IJ SOLN: 25.0000 ug | INTRAMUSCULAR | Status: DC | PRN

## 2017-03-28 MED ORDER — SODIUM CHLORIDE 0.9 % IV BOLUS (SEPSIS)
1000.0000 mL | Freq: Once | INTRAVENOUS | Status: AC
  Administered 2017-03-28: 1000 mL via INTRAVENOUS

## 2017-03-28 MED ORDER — ONDANSETRON HCL 4 MG/2ML IJ SOLN
4.0000 mg | Freq: Once | INTRAMUSCULAR | Status: AC
  Administered 2017-03-28: 4 mg via INTRAVENOUS
  Filled 2017-03-28: qty 2

## 2017-03-28 MED ORDER — INSULIN ASPART 100 UNIT/ML ~~LOC~~ SOLN
0.0000 [IU] | SUBCUTANEOUS | Status: DC
  Administered 2017-03-29: 15 [IU] via SUBCUTANEOUS

## 2017-03-28 MED ORDER — ONDANSETRON HCL 4 MG/2ML IJ SOLN
4.0000 mg | Freq: Four times a day (QID) | INTRAMUSCULAR | Status: DC | PRN
  Administered 2017-03-28 – 2017-03-31 (×4): 4 mg via INTRAVENOUS
  Filled 2017-03-28 (×3): qty 2

## 2017-03-28 MED ORDER — LIDOCAINE-EPINEPHRINE 1 %-1:100000 IJ SOLN
10.0000 mL | Freq: Once | INTRAMUSCULAR | Status: DC
  Filled 2017-03-28: qty 10

## 2017-03-28 MED ORDER — INSULIN ASPART 100 UNIT/ML ~~LOC~~ SOLN: 0.0000 [IU] | Freq: Three times a day (TID) | SUBCUTANEOUS | Status: DC

## 2017-03-28 MED ORDER — MORPHINE SULFATE (PF) 4 MG/ML IV SOLN
4.0000 mg | Freq: Once | INTRAVENOUS | Status: AC
  Administered 2017-03-28: 4 mg via INTRAVENOUS
  Filled 2017-03-28: qty 1

## 2017-03-28 MED ORDER — CEFAZOLIN SODIUM-DEXTROSE 2-4 GM/100ML-% IV SOLN: 2.0000 g | INTRAVENOUS | Status: DC

## 2017-03-28 NOTE — ED Provider Notes (Signed)
[facility] DEPT Provider Note   CSN: [id number] Arrival date & time: 03/28/17  1503     History   Chief Complaint Chief Complaint  Patient presents with  . Fall  . Arm Injury  . Facial Laceration    HPI Christy [name] is a 76 y.o. female.  Patient speaks only [place]. With an interpreter, patient reports that she was ambulate in, had a mechanical fall. Reports that she felt like her left knee went out from under her. Denies any prodromal symptoms such as chest pain, shortness of breath, palpitations, headache, vision changes. [place] to the ground. Immediate pain in bilateral wrists. She did hit her head on the ground. No loss consciousness. Reporting pain in bilateral wrists, left knee, and face.   The history is provided by the patient.  Illness  This is a new problem. The current episode started 1 to 2 hours ago. The problem occurs constantly. The problem has not changed since onset.Pertinent negatives include no chest pain, no abdominal pain, no headaches and no shortness of breath. She has tried nothing for the symptoms.    Past Medical History:  Diagnosis Date  . Abnormal ECG    Inferior Q waves  . Cancer ([place])    stage II right breast cancer  . Cataract   . Diabetes mellitus   . Hypercholesterolemia   . Hyperlipidemia   . Hypertension   . Osteoporosis   . Overactive bladder     Patient Active Problem List   Diagnosis Date Noted  . Hyperlipidemia 12/27/2016  . Essential hypertension 12/27/2016  . Elevated serum creatinine 08/07/2014  . Elevated BUN 08/07/2014  . Uncontrolled type II diabetes mellitus ([place]) 08/07/2014  . Osteoporosis 08/07/2014  . Breast cancer of upper-outer quadrant of right female breast ([place]) 08/07/2013  . Abnormal EKG 08/21/2011    Past Surgical History:  Procedure Laterality Date  . BREAST SURGERY     right lumpectomy, sentinel node biopsy  . CATARACT EXTRACTION Bilateral 2008  . FRACTURE SURGERY     ORIF right distal radius fx   . JOINT REPLACEMENT     right hip  . PORT-A-CATH REMOVAL  09/11/2011   Procedure: REMOVAL PORT-A-CATH;  Surgeon: Rolm Bookbinder, MD;  Location: [facility];  Service: General;  Laterality: Left;  local   . PORTACATH PLACEMENT      OB History    No data available       Home Medications    Prior to Admission medications   Medication Sig Start Date End Date Taking? Authorizing Provider  alendronate (FOSAMAX) 70 MG tablet Take 1 tablet (70 mg total) by mouth every 7 (seven) days. Take with a full glass of water on an empty stomach. 12/31/16   [name], Earna Coder, MD  amLODipine (NORVASC) 5 MG tablet Take 1 tablet (5 mg total) by mouth daily. 12/31/16   [name], Earna Coder, MD  Calcium Carb-Cholecalciferol (CALCIUM 1000 + D PO) Take 1 tablet by mouth daily.     [provider]  Cholecalciferol (VITAMIN D-3) 1000 UNITS CAPS Take 1 capsule by mouth daily.    [provider]  fenofibrate (TRICOR) 48 MG tablet Take 1 tablet (48 mg total) by mouth daily. 12/31/16   [name], Earna Coder, MD  furosemide (LASIX) 20 MG tablet Take 20 mg in the morning, in addition to your nightly dose. 20 mg twice a day for 5 days, then resume 20 daily 03/21/16   Tanna Furry, MD  furosemide (LASIX) 40 MG tablet Take 1 tablet (  40 mg total) by mouth daily. 12/31/16   [name], Earna Coder, MD  glimepiride (AMARYL) 2 MG tablet Take 1 tablet (2 mg total) by mouth daily before breakfast. 12/31/16   [name], Earna Coder, MD  ibuprofen (ADVIL,MOTRIN) 800 MG tablet Take 800 mg by mouth every 8 (eight) hours as needed for fever or moderate pain.  02/28/13   [provider]  insulin aspart (NOVOLOG) 100 UNIT/ML injection Inject 30 Units into the skin once. 12/31/16 12/31/16  [name], Earna Coder, MD  insulin detemir (LEVEMIR) 100 UNIT/ML injection Inject 30 Units into the skin at bedtime.    [provider]  insulin detemir (LEVEMIR) 100 UNIT/ML injection Inject 0.2 mLs (20 Units total) into the skin at  bedtime. 12/31/16   [name], Earna Coder, MD  lisinopril (PRINIVIL,ZESTRIL) 40 MG tablet Take 1 tablet (40 mg total) by mouth daily. 12/31/16   [name], Earna Coder, MD  metFORMIN (GLUCOPHAGE-XR) 500 MG 24 hr tablet Take 2 tablets (1,000 mg total) by mouth 2 (two) times daily. 01/13/17   [name], Earna Coder, MD  potassium chloride (K-DUR) 10 MEQ tablet Take 1 tablet (10 mEq total) by mouth 2 (two) times daily. Patient not taking: Reported on 08/03/2016 03/21/16   Tanna Furry, MD  simvastatin (ZOCOR) 40 MG tablet TAKE 1 TABLET BY MOUTH ONCE DAILY 02/02/17   [name], Earna Coder, MD  solifenacin (VESICARE) 10 MG tablet Take 1 tablet (10 mg total) by mouth daily. 12/31/16   [name], MD    Family History Family History  Problem Relation Age of Onset  . Stroke Mother   . Stroke Father   . Hypertension Sister   . Stroke Sister   . Stroke Brother     Social History Social History  Substance Use Topics  . Smoking status: Former Smoker    Types: Cigarettes    Quit date: 12/27/2012  . Smokeless tobacco: Never Used     Comment: quit 2009  . Alcohol use No     Allergies   Patient has no known allergies.   Review of Systems Review of Systems  Constitutional: Negative for chills and fever.  Eyes: Negative for visual disturbance.  Respiratory: Negative for shortness of breath.   Cardiovascular: Negative for chest pain and palpitations.  Gastrointestinal: Negative for abdominal pain, nausea and vomiting.  Musculoskeletal: Negative for back pain and neck pain.  Skin: Positive for wound.  Neurological: Negative for syncope, facial asymmetry, speech difficulty, weakness, light-headedness, numbness and headaches.       No LOC  All other systems reviewed and are negative.    Physical Exam Updated Vital Signs BP 120/69   Pulse 100   Temp 98.2 F (36.8 C) (Oral)   Resp (!) 24   Ht 5\' 7"  (1.702 m)   Wt 102.1 kg (225 lb)   SpO2 (!) 89%   BMI 35.24 kg/m   Physical Exam  Constitutional: She  appears well-developed and well-nourished. No distress.  HENT:  Head: Head is with abrasion.    Eyes: Conjunctivae are normal.  Neck: Neck supple.  Cardiovascular: Normal rate and regular rhythm.   No murmur heard. Pulmonary/Chest: Effort normal and breath sounds normal. No respiratory distress.  Abdominal: Soft. There is no tenderness.  Musculoskeletal: She exhibits no edema.  No tenderness to palpation of C/T/L-spine. No step-offs. Left upper extremity with significant gross deformity to the left wrist. Moving all digits. Good cap refill. Sensation intact. Open wounds overlying fracture site. No tenderness to palpation of hand, elbow, shoulder. Right upper extremity with tenderness to  palpation of the wrist. No gross deformity. No open wounds. Neurovascularly intact. No tenderness to palpation along the rest of the arm. Left lower extremity with gross deformity of the left knee. Bruising over the patella. Extensor mechanism intact. Tenderness to palpation over the patella. Neurovascularly intact. No other tenderness along the rest of the leg. No tenderness to palpation over bilateral hips.  Neurological: She is alert. She has normal strength. No cranial nerve deficit or sensory deficit. She displays a negative Romberg sign. Coordination normal. GCS eye subscore is 4. GCS verbal subscore is 5. GCS motor subscore is 6.  Skin: Skin is warm and dry.  Psychiatric: She has a normal mood and affect.  Nursing note and vitals reviewed.    ED Treatments / Results  Labs (all labs ordered are listed, but only abnormal results are displayed) Labs Reviewed  CBC WITH DIFFERENTIAL/PLATELET - Abnormal; Notable for the following:       Result Value   WBC 13.5 (*)    Neutro Abs 11.6 (*)    All other components within normal limits  CBG MONITORING, ED - Abnormal; Notable for the following:    Glucose-Capillary 519 (*)    All other components within normal limits  BASIC METABOLIC PANEL  I-STAT  VENOUS BLOOD GAS, ED    EKG  EKG Interpretation None       Radiology No results found.  Procedures ORTHOPEDIC INJURY TREATMENT Date/Time: 03/29/2017 1:11 AM Performed by: Maryan Puls Authorized by: Lajean Saver  Consent: Verbal consent obtained. Consent given by: patient Patient understanding: patient states understanding of the procedure being performed Patient identity confirmed: verbally with patient and arm band Injury location: forearm Location details: left forearm Injury type: fracture Fracture type: distal radius and ulnar styloid Pre-procedure neurovascular assessment: neurovascularly intact Pre-procedure distal perfusion: normal Pre-procedure neurological function: normal Pre-procedure range of motion: reduced  Anesthesia: Local anesthesia used: yes Local Anesthetic: lidocaine 1% with epinephrine Anesthetic total: 10 mL  Sedation: Patient sedated: yes Sedatives: midazolam Analgesia: fentanyl Manipulation performed: yes Reduction successful: no Immobilization: splint Post-procedure distal perfusion: normal Post-procedure neurological function: normal Patient tolerance: Patient tolerated the procedure well with no immediate complications    (including critical care time)  Medications Ordered in ED Medications  Tdap (BOOSTRIX) injection 0.5 mL (not administered)  lidocaine-EPINEPHrine (XYLOCAINE W/EPI) 1 %-1:100000 (with pres) injection 10 mL (not administered)  sodium chloride 0.9 % bolus 1,000 mL (not administered)  ceFAZolin (ANCEF) IVPB 2g/100 mL premix (not administered)  morphine 4 MG/ML injection 4 mg (4 mg Intravenous Given 03/28/17 1654)     Initial Impression / Assessment and Plan / ED Course  I have reviewed the triage vital signs and the nursing notes.  Pertinent labs & imaging results that were available during my care of the patient were reviewed by me and considered in my medical decision making (see chart for details).      Patient with evidence of multiple gross deformities on arrival. Dried blood on the patient's face. Swelling to the nose. CT of the head and the face shows evidence of multiple facial fractures. Contacted on-call ENT Dr. Benjamine Mola. He reports that these injuries will not require emergent intervention. He will evaluate the patient inpatient. CT otherwise does not show evidence of intracranial bleed. X-rays of bilateral wrists obtained as well. Right wrist shows evidence of a distal radius fracture as well as fracture of the hardware in her wrist. Discussed the case with on-call orthopedics. At their suggestion, splinting the patient and the patient  will likely require surgery tomorrow for this. X-ray of the left wrist shows a highly comminuted, displaced distal radius fracture and ulnar styloid fracture with dislocation. Appears to be an open wound as well. Ancef and tetanus given to the patient. Spoke with on-call orthopedics regarding this as well. This will also require surgery tomorrow. At their suggestion, attempted some degree of reduction as above. Neurovascularly intact after performance. Splinted the patient afterwards. Patient also had evidence of a left knee injury. Extensor mechanism was intact. X-ray shows evidence of left patellar fracture. Spoke with orthopedics regarding this, and we'll place the patient in any mobilizer at their direction. Given the multiple injuries, we'll admit the patient to the hospitalist. Nothing by mouth at MN.  As far as the patient's fall, seems to have been mechanical in nature. She denies any other prodromal symptoms.  Here the patient does have an acute kidney injury. We'll give her IV fluids. She is significantly hyperglycemic as well. No evidence of DKA. Given the patient's subcutaneous insulin.  Final Clinical Impressions(s) / ED Diagnoses   Final diagnoses:  Fall    New Prescriptions New Prescriptions   No medications on file     Maryan Puls,  MD 03/29/17 9311    Lajean Saver, MD 03/31/17 [phone]

## 2017-03-28 NOTE — ED Notes (Signed)
Pt returns from xray intermittent snoring respirations but rouses easily to voice.

## 2017-03-28 NOTE — Progress Notes (Addendum)
The patient's chart was reviewed.  The patient sustained bilateral distal radius fractures.  The patient is going to require stabilization of both fractures.  The patient is on the operating room schedule for the morning.  She will require ORIF of both wrists.  The patient will be splinted in the emergency department.  General orthopedics will be contacted for the management of her patella fracture.  She will be need to be seen and admitted by the internal medicine service for preoperative evaluation and surgical clearance for her bilateral distal radius fractures.  She is going to require a high level of care in terms of need of assistance following both wrist fractures and a patella fracture.  The operating room has been notified of the need to proceed to the operating room in the morning.

## 2017-03-28 NOTE — ED Notes (Signed)
Dr. Delane Ginger states to place IV above fracture at left arm.

## 2017-03-28 NOTE — Progress Notes (Addendum)
Notified MD of vomiting episodes x 4, orders received

## 2017-03-28 NOTE — ED Notes (Signed)
Patient transported to X-ray 

## 2017-03-28 NOTE — ED Notes (Signed)
Pt placed on 02/[place] at 2l

## 2017-03-28 NOTE — ED Notes (Signed)
Per MD do not give versed or fentanyl as pt started to vomit.

## 2017-03-28 NOTE — Progress Notes (Signed)
Orthopedic Tech Progress Note Patient Details:  Christy [name] 07-06-1941 [id number]  Ortho Devices Type of Ortho Device: Ace wrap, Knee Immobilizer, Sugartong splint Ortho Device/Splint Location: (B) short arm splints LLE KI Ortho Device/Splint Interventions: Ordered, Application   Braulio Bosch 03/28/2017, 7:59 PM

## 2017-03-28 NOTE — ED Notes (Signed)
Pt began vomiting black foul smelling emesis w/ chunks of food present.  Suction set up at bedside.  Zofran given.

## 2017-03-28 NOTE — H&P (Signed)
History and Physical    Christy [name] WOE:[id number] DOB: 09-20-1941 DOA: 03/28/2017  Referring MD/NP/PA: Maryan Puls, M.D.(resident) PCP: [name], MD  Patient coming from: Side of the road via EMS  Chief Complaint: Fall  HPI: Christy [name] is a 76 y.o. Bosnian only speaking female with medical history significant of HTN, DM type II, osteoporosis, breast cancer; who present after having a fall. Daughter helps translate. Patient was leaving church with her son when she states her left leg went out and she fell face forward onto the side of the road. He denies having significant palpitations, chest pain, shortness of breath, focal weakness, or change in vision. She immediately complained of pain in the bilateral wrists, face, and left knee. Patient did hit her head on the ground, but denies any loss of consciousness. Other associated symptoms include that the patient was complaining of being thirsty prior to today. The patient was noted to have fallen down steps back in 2011 fracturing her arm and hip for which she had to have surgery.  ED Course: Upon admission into the emergency department patient was noted to be afebrile, pulse 96-100, respirations 20-25, and O2 saturations as low as 86% after being given pain medications. Oxygen saturation is improved with nasal cannula oxygen.   Imaging studies revealed that the patient sustained bilateral distal radius , patella, and facial fractures. Fractures to the bilateral wrists and knee were attempted to be reduced and casted while in the emergency department. Dr. Caralyn Guile  orthopedics and Dr. Benjamine Mola were consulted and plan for patient to go to operating room in a.m. for ORIF distal radial fractures. She received antibiotics of Ancef for open fractures, Tdap, and 4 mg of morphine.  Review of Systems: As per HPI otherwise 10 point review of systems negative.   Past Medical History:  Diagnosis Date  . Abnormal ECG    Inferior Q waves  .  Cancer ([place])    stage II right breast cancer  . Cataract   . Diabetes mellitus   . Hypercholesterolemia   . Hyperlipidemia   . Hypertension   . Osteoporosis   . Overactive bladder     Past Surgical History:  Procedure Laterality Date  . BREAST SURGERY     right lumpectomy, sentinel node biopsy  . CATARACT EXTRACTION Bilateral 2008  . FRACTURE SURGERY     ORIF right distal radius fx  . JOINT REPLACEMENT     right hip  . PORT-A-CATH REMOVAL  09/11/2011   Procedure: REMOVAL PORT-A-CATH;  Surgeon: Rolm Bookbinder, MD;  Location: [facility];  Service: General;  Laterality: Left;  local   . PORTACATH PLACEMENT       reports that she quit smoking about 4 years ago. Her smoking use included Cigarettes. She has never used smokeless tobacco. She reports that she does not drink alcohol or use drugs.  No Known Allergies  Family History  Problem Relation Age of Onset  . Stroke Mother   . Stroke Father   . Hypertension Sister   . Stroke Sister   . Stroke Brother     Prior to Admission medications   Medication Sig Start Date End Date Taking? Authorizing Provider  alendronate (FOSAMAX) 70 MG tablet Take 1 tablet (70 mg total) by mouth every 7 (seven) days. Take with a full glass of water on an empty stomach. 12/31/16   [name], Earna Coder, MD  amLODipine (NORVASC) 5 MG tablet Take 1 tablet (5 mg total) by mouth daily. 12/31/16  [name], [name], MD  Calcium Carb-Cholecalciferol (CALCIUM 1000 + D PO) Take 1 tablet by mouth daily.     [provider]  Cholecalciferol (VITAMIN D-3) 1000 UNITS CAPS Take 1 capsule by mouth daily.    [provider]  fenofibrate (TRICOR) 48 MG tablet Take 1 tablet (48 mg total) by mouth daily. 12/31/16   [name], Earna Coder, MD  furosemide (LASIX) 20 MG tablet Take 20 mg in the morning, in addition to your nightly dose. 20 mg twice a day for 5 days, then resume 20 daily 03/21/16   Tanna Furry, MD  furosemide (LASIX) 40 MG tablet Take  1 tablet (40 mg total) by mouth daily. 12/31/16   [name], Earna Coder, MD  glimepiride (AMARYL) 2 MG tablet Take 1 tablet (2 mg total) by mouth daily before breakfast. 12/31/16   [name], Earna Coder, MD  ibuprofen (ADVIL,MOTRIN) 800 MG tablet Take 800 mg by mouth every 8 (eight) hours as needed for fever or moderate pain.  02/28/13   [provider]  insulin aspart (NOVOLOG) 100 UNIT/ML injection Inject 30 Units into the skin once. 12/31/16 12/31/16  [name], Earna Coder, MD  insulin detemir (LEVEMIR) 100 UNIT/ML injection Inject 30 Units into the skin at bedtime.    [provider]  insulin detemir (LEVEMIR) 100 UNIT/ML injection Inject 0.2 mLs (20 Units total) into the skin at bedtime. 12/31/16   [name], Earna Coder, MD  lisinopril (PRINIVIL,ZESTRIL) 40 MG tablet Take 1 tablet (40 mg total) by mouth daily. 12/31/16   [name], Earna Coder, MD  metFORMIN (GLUCOPHAGE-XR) 500 MG 24 hr tablet Take 2 tablets (1,000 mg total) by mouth 2 (two) times daily. 01/13/17   [name], Earna Coder, MD  potassium chloride (K-DUR) 10 MEQ tablet Take 1 tablet (10 mEq total) by mouth 2 (two) times daily. Patient not taking: Reported on 08/03/2016 03/21/16   Tanna Furry, MD  simvastatin (ZOCOR) 40 MG tablet TAKE 1 TABLET BY MOUTH ONCE DAILY 02/02/17   [name], Earna Coder, MD  solifenacin (VESICARE) 10 MG tablet Take 1 tablet (10 mg total) by mouth daily. 12/31/16   [name], MD    Physical Exam:    Constitutional: Obese elderly female who appears to be in some mild discomfort. Vitals:   03/28/17 1700 03/28/17 1800 03/28/17 1824 03/28/17 1930  BP: (!) 136/103 106/87 (!) 161/100 (!) 165/74  Pulse: 100  98 96  Resp: (!) 21  (!) 21 20  Temp:      TempSrc:      SpO2: 93%  (!) 86% 95%  Weight:      Height:       Eyes: PERRL, lids and conjunctivae normal ENMT: Mucous membranes are dry. Posterior pharynx clear of any exudate or lesions. Neck: normal, supple, no masses, no thyromegaly Respiratory: Normal respiratory effort  with decreased overall aeration and bibasilar crackles appreciated.. Cardiovascular: Regular rate and rhythm, no murmurs / rubs / gallops. No extremity edema. 2+ pedal pulses. No carotid bruits.  Abdomen: no tenderness, no masses palpated. No hepatosplenomegaly. Bowel sounds positive.  Musculoskeletal: no clubbing / cyanosis. Bilateral wrist casted as well as left leg in immobilizer Skin: Multiple facial lacerations and abrasions as well as bilateral upper extremities and lower extremities. Neurologic: CN 2-12 grossly intact. Sensation intact, DTR normal. Patient able to move all extremities.  Psychiatric: Normal judgment and insight. Alert and oriented x 3. Normal mood.     Labs on Admission: I have personally reviewed following labs and imaging studies  CBC:  Recent Labs Lab 03/28/17 1645  WBC  13.5*  NEUTROABS 11.6*  HGB 13.5  HCT 40.7  MCV 92.5  PLT 812   Basic Metabolic Panel:  Recent Labs Lab 03/28/17 1645  NA 135  K 5.1  CL 98*  CO2 23  GLUCOSE 546*  BUN 44*  CREATININE 1.70*  CALCIUM 9.7   GFR: Estimated Creatinine Clearance: 34.6 mL/min (A) (by C-G formula based on SCr of 1.7 mg/dL (H)). Liver Function Tests: No results for input(s): AST, ALT, ALKPHOS, BILITOT, PROT, ALBUMIN in the last 168 hours. No results for input(s): LIPASE, AMYLASE in the last 168 hours. No results for input(s): AMMONIA in the last 168 hours. Coagulation Profile: No results for input(s): INR, PROTIME in the last 168 hours. Cardiac Enzymes: No results for input(s): CKTOTAL, CKMB, CKMBINDEX, TROPONINI in the last 168 hours. BNP (last 3 results) No results for input(s): PROBNP in the last 8760 hours. HbA1C: No results for input(s): HGBA1C in the last 72 hours. CBG:  Recent Labs Lab 03/28/17 1545 03/28/17 1855 03/28/17 1942  GLUCAP 519* 487* 468*   Lipid Profile: No results for input(s): CHOL, HDL, LDLCALC, TRIG, CHOLHDL, LDLDIRECT in the last 72 hours. Thyroid Function  Tests: No results for input(s): TSH, T4TOTAL, FREET4, T3FREE, THYROIDAB in the last 72 hours. Anemia Panel: No results for input(s): VITAMINB12, FOLATE, FERRITIN, TIBC, IRON, RETICCTPCT in the last 72 hours. Urine analysis:    Component Value Date/Time   COLORURINE YELLOW 10/12/2016 1923   APPEARANCEUR CLOUDY (A) 10/12/2016 1923   LABSPEC 1.011 10/12/2016 1923   PHURINE 5.0 10/12/2016 1923   GLUCOSEU 100 (A) 10/12/2016 [place] NEGATIVE 10/12/2016 1923   BILIRUBINUR SMALL (A) 10/12/2016 1923   KETONESUR NEGATIVE 10/12/2016 1923   PROTEINUR NEGATIVE 10/12/2016 1923   UROBILINOGEN 0.2 11/27/2013 2012   NITRITE NEGATIVE 10/12/2016 1923   LEUKOCYTESUR SMALL (A) 10/12/2016 1923   Sepsis Labs: No results found for this or any previous visit (from the past 240 hour(s)).   Radiological Exams on Admission: Dg Chest 1 View  Result Date: 03/28/2017 CLINICAL DATA:  Legs gave out, patient fell, deformities to LEFT and RIGHT wrists, laceration to bridge of nose, LEFT knee abrasion EXAM: CHEST 1 VIEW COMPARISON:  07/02/2010 FINDINGS: Enlargement of cardiac silhouette with slight vascular congestion. Mediastinal contours normal. Interval removal of Port-A-Cath. Atherosclerotic calcification aorta. Mild bibasilar atelectasis versus infiltrate. Upper lungs clear. No pleural effusion or pneumothorax. Bones demineralized. IMPRESSION: Enlargement of cardiac silhouette with pulmonary vascular congestion. Mild bibasilar atelectasis versus infiltrate. Electronically Signed   By: Lavonia Dana M.D.   On: 03/28/2017 18:27   Dg Wrist Complete Left  Result Date: 03/28/2017 CLINICAL DATA:  76 year old female with history of trauma from a fall complaining of pain in the left wrist. EXAM: LEFT WRIST - COMPLETE 3+ VIEW COMPARISON:  Left hand radiograph 06/13/2009. FINDINGS: Highly comminuted fractures of the distal radius and ulna are noted. The radial fracture is highly comminuted, intra-articular, impacted and  displaced, with extension of the fracture through the metaphyseal region into the distal diaphyseal region. Individual fracture fragments are displaced in a random distribution, generally extending outward from the distal radius. There is some very mild volar angulation on the cross-table lateral view (approximately 15-20 degrees). Distal ulnar fracture is also highly comminuted, with radial displacement of the distal ulna approximately 1 to 1-1/2 shaft width. There also appears to be some distal migration of the proximal ulnar fragment into the overlying soft tissues which are markedly swollen. The carpal some cells appear intact. IMPRESSION: 1. Highly  comminuted, displaced and angulated intra-articular fractures of the distal radius and ulna, as detailed above. Electronically Signed   By: Vinnie Langton M.D.   On: 03/28/2017 18:27   Dg Wrist Complete Right  Result Date: 03/28/2017 CLINICAL DATA:  Recent fall with wrist pain, initial encounter EXAM: RIGHT WRIST - COMPLETE 3+ VIEW COMPARISON:  06/11/2009 FINDINGS: Fixation sideplate is noted along the distal radius in an area of prior fracture. A new fracture is noted in the radial metaphysis with posterior angulation. Additionally there is fracture of the fixation sideplate distally at the area of fracture. Deformity of the distal ulna is noted related to prior fracture and healing. No other are seen. IMPRESSION: Changes consistent with previous healed radial and ulnar fractures with surgical fixation. New radial metaphyseal fracture with posterior angulation and fracture of the fixation sideplate. Electronically Signed   By: Inez Catalina M.D.   On: 03/28/2017 18:26   Ct Head Wo Contrast  Result Date: 03/28/2017 CLINICAL DATA:  Recent fall with facial injury and headaches, initial encounter EXAM: CT HEAD WITHOUT CONTRAST CT MAXILLOFACIAL WITHOUT CONTRAST TECHNIQUE: Multidetector CT imaging of the head and maxillofacial structures were performed using the  standard protocol without intravenous contrast. Multiplanar CT image reconstructions of the maxillofacial structures were also generated. COMPARISON:  06/11/2009 FINDINGS: CT HEAD FINDINGS Brain: Diffuse atrophic changes are noted. No findings to suggest acute hemorrhage, acute infarction or space-occupying mass lesion are seen. Areas of chronic white matter ischemic change are noted stable from the prior exam. Vascular: No hyperdense vessel or unexpected calcification. Skull: The skull is intact. Multiple fractures are noted involving the nasal bones as well as the maxillary antra bilaterally. These will be better evaluated on the maxillofacial CT. Other: Air-fluid levels are noted in the maxillary antra bilaterally. CT MAXILLOFACIAL FINDINGS Osseous: Bilateral nasal bone fractures are noted with mild impaction. Fractures of the pterygoid plates are noted with mild displacement bilaterally. Fracture lines are also noted within the anterior and lateral walls of the maxillary antra bilaterally. Involvement of the posterior walls of the maxillary antra are noted as well. No definitive orbital fracture is seen. No muscular entrapment noted. Zygomatic arches are within normal limits. Orbits: The orbits and their contents are within normal limits. Sinuses: Considerable soft tissue swelling is noted within the nasal passages. Air-fluid levels are noted within the maxillary antra bilaterally. Soft tissues: Mild soft tissue swelling is noted in the right forehead. No sizable hematoma seen. No other soft tissue abnormality is noted. IMPRESSION: CT of the maxillofacial bones: Multiple fractures involving the maxillary antra bilaterally as well as the pterygoid plates bilaterally. No definitive orbital involvement is noted. Air-fluid levels within the maxillary antra bilaterally. CT of the head: Chronic changes without acute abnormality. Electronically Signed   By: Inez Catalina M.D.   On: 03/28/2017 17:53   Dg Knee Complete  4 Views Left  Result Date: 03/28/2017 CLINICAL DATA:  Recent fall with knee pain, initial encounter EXAM: LEFT KNEE - COMPLETE 4+ VIEW COMPARISON:  10/12/2016 FINDINGS: There is a transverse fracture through the midportion of the patella with mild distraction of the fracture fragments of approximately 1 cm. Overlying soft tissue swelling and small joint effusion is noted. No other fracture or dislocation is seen. IMPRESSION: Patellar fracture with small joint effusion. No other bony abnormality is seen. Electronically Signed   By: Inez Catalina M.D.   On: 03/28/2017 18:24   Ct Maxillofacial Wo Cm  Result Date: 03/28/2017 CLINICAL DATA:  Recent fall with  facial injury and headaches, initial encounter EXAM: CT HEAD WITHOUT CONTRAST CT MAXILLOFACIAL WITHOUT CONTRAST TECHNIQUE: Multidetector CT imaging of the head and maxillofacial structures were performed using the standard protocol without intravenous contrast. Multiplanar CT image reconstructions of the maxillofacial structures were also generated. COMPARISON:  06/11/2009 FINDINGS: CT HEAD FINDINGS Brain: Diffuse atrophic changes are noted. No findings to suggest acute hemorrhage, acute infarction or space-occupying mass lesion are seen. Areas of chronic white matter ischemic change are noted stable from the prior exam. Vascular: No hyperdense vessel or unexpected calcification. Skull: The skull is intact. Multiple fractures are noted involving the nasal bones as well as the maxillary antra bilaterally. These will be better evaluated on the maxillofacial CT. Other: Air-fluid levels are noted in the maxillary antra bilaterally. CT MAXILLOFACIAL FINDINGS Osseous: Bilateral nasal bone fractures are noted with mild impaction. Fractures of the pterygoid plates are noted with mild displacement bilaterally. Fracture lines are also noted within the anterior and lateral walls of the maxillary antra bilaterally. Involvement of the posterior walls of the maxillary antra  are noted as well. No definitive orbital fracture is seen. No muscular entrapment noted. Zygomatic arches are within normal limits. Orbits: The orbits and their contents are within normal limits. Sinuses: Considerable soft tissue swelling is noted within the nasal passages. Air-fluid levels are noted within the maxillary antra bilaterally. Soft tissues: Mild soft tissue swelling is noted in the right forehead. No sizable hematoma seen. No other soft tissue abnormality is noted. IMPRESSION: CT of the maxillofacial bones: Multiple fractures involving the maxillary antra bilaterally as well as the pterygoid plates bilaterally. No definitive orbital involvement is noted. Air-fluid levels within the maxillary antra bilaterally. CT of the head: Chronic changes without acute abnormality. Electronically Signed   By: Inez Catalina M.D.   On: 03/28/2017 17:53    EKG: Independently reviewed. Sinus rhythm with a first-degree AV block.   Assessment/Plan Fall with multiple fractures: Acute. Patient fell on the side of the road following church. No significant prodromal symptoms noted. Imaging studies show bilateral distal radius , patella, and facial fractures. - Admit to telemetry bed - NPO after midnight - Place Foley catheter - Fentanyl IV prn pain - Appreciate ENT and orthopedic consultative services, will follow-up for further recommendations   Leukocytosis WBC 13.5. Suspect secondary to acute fractures. - Follow-up urinalysis - Repeat CBC in a.m.  Diabetes mellitus type 2 with hyperglycemia: Patient's initial blood glucose was elevated at 546 without elevated anion gap. Last hemoglobin A1c noted to be 9.5 on 3/7. - Hypoglycemic protocol - Hold metformin  - CBGs every 4 hours with a moderate sliding scale insulin  - Adjust insulin regimen as needed  Acute renal failure on chronic kidney disease stage III: Baseline creatinine previously had been around 1.07-1.2. Patient presents with a creatinine of 1.7  and BUN 44. - IV fluids as tolerated - Recheck BMP in a.m.  Osteoporosis  DVT prophylaxis: scd  Code Status: Full  Family Communication: Discussed plan of care with the patient and family present at bedside  Disposition Plan: Likely will need rehabilitation following surgical repair Consults called:  orthopedics and ENT  Admission status: inpatient   Norval Morton MD [facility] Pager [phone]  If 7PM-7AM, please contact night-coverage [email or web address] Password [facility]  03/28/2017, 8:15 PM

## 2017-03-28 NOTE — ED Notes (Signed)
ED Provider at bedside. 

## 2017-03-28 NOTE — Progress Notes (Signed)
Patient arrived to room 5N07 from ED.  Assessment complete, VS obtained, patient unable to answer database questions at this time. Will call interpreter line.

## 2017-03-28 NOTE — Progress Notes (Signed)
Notified md of troponin of 0.03

## 2017-03-28 NOTE — ED Notes (Signed)
Iv team contacted for iv assistance.

## 2017-03-28 NOTE — ED Triage Notes (Addendum)
Pt arrives EMS from side of road where pt fell when her legs gave out. Obvious deformity to left wrist. Laceration to bridge of nose left knee abrasion denies LOC.arrives with ice and splint by ems to left arm. Pt speaks croation with interpretor at bedside.

## 2017-03-29 ENCOUNTER — Inpatient Hospital Stay (HOSPITAL_COMMUNITY): Payer: Medicare Other | Admitting: Anesthesiology

## 2017-03-29 ENCOUNTER — Encounter (HOSPITAL_COMMUNITY): Payer: Self-pay | Admitting: Physician Assistant

## 2017-03-29 ENCOUNTER — Inpatient Hospital Stay (HOSPITAL_COMMUNITY): Payer: Medicare Other

## 2017-03-29 ENCOUNTER — Encounter (HOSPITAL_COMMUNITY)
Admission: EM | Disposition: A | Discharge status: Skilled Nursing Facility | Payer: Self-pay | Source: Home / Self Care | Attending: Family Medicine

## 2017-03-29 DIAGNOSIS — R748 Abnormal levels of other serum enzymes: Secondary | ICD-10-CM

## 2017-03-29 DIAGNOSIS — W19XXXA Unspecified fall, initial encounter: Secondary | ICD-10-CM

## 2017-03-29 DIAGNOSIS — Z01810 Encounter for preprocedural cardiovascular examination: Secondary | ICD-10-CM

## 2017-03-29 DIAGNOSIS — I447 Left bundle-branch block, unspecified: Secondary | ICD-10-CM

## 2017-03-29 DIAGNOSIS — S02401A Maxillary fracture, unspecified, initial encounter for closed fracture: Secondary | ICD-10-CM

## 2017-03-29 DIAGNOSIS — T07XXXA Unspecified multiple injuries, initial encounter: Secondary | ICD-10-CM

## 2017-03-29 DIAGNOSIS — S022XXA Fracture of nasal bones, initial encounter for closed fracture: Secondary | ICD-10-CM

## 2017-03-29 DIAGNOSIS — N179 Acute kidney failure, unspecified: Secondary | ICD-10-CM | POA: Diagnosis present

## 2017-03-29 DIAGNOSIS — D72829 Elevated white blood cell count, unspecified: Secondary | ICD-10-CM | POA: Diagnosis present

## 2017-03-29 HISTORY — PX: ORIF RADIAL FRACTURE: SHX5113

## 2017-03-29 LAB — ECHOCARDIOGRAM COMPLETE
Height: 67 in
Weight: 3599.67 oz

## 2017-03-29 LAB — GLUCOSE, CAPILLARY
GLUCOSE-CAPILLARY: 229 mg/dL — AB (ref 65–99)
GLUCOSE-CAPILLARY: 240 mg/dL — AB (ref 65–99)
GLUCOSE-CAPILLARY: 244 mg/dL — AB (ref 65–99)
GLUCOSE-CAPILLARY: 175 mg/dL — AB (ref 65–99)
GLUCOSE-CAPILLARY: 196 mg/dL — AB (ref 65–99)
GLUCOSE-CAPILLARY: 241 mg/dL — AB (ref 65–99)
Glucose-Capillary: 485 mg/dL — ABNORMAL HIGH (ref 65–99)
Glucose-Capillary: 379 mg/dL — ABNORMAL HIGH (ref 65–99)
Glucose-Capillary: 357 mg/dL — ABNORMAL HIGH (ref 65–99)
Glucose-Capillary: 328 mg/dL — ABNORMAL HIGH (ref 65–99)
Glucose-Capillary: 321 mg/dL — ABNORMAL HIGH (ref 65–99)
Glucose-Capillary: 244 mg/dL — ABNORMAL HIGH (ref 65–99)
Glucose-Capillary: 287 mg/dL — ABNORMAL HIGH (ref 65–99)
Glucose-Capillary: 269 mg/dL — ABNORMAL HIGH (ref 65–99)

## 2017-03-29 LAB — BASIC METABOLIC PANEL
Anion gap: 14 (ref 5–15)
Anion gap: 11 (ref 5–15)
BUN: 39 mg/dL — AB (ref 6–20)
BUN: 38 mg/dL — ABNORMAL HIGH (ref 6–20)
CHLORIDE: 103 mmol/L (ref 101–111)
CO2: 25 mmol/L (ref 22–32)
CO2: 22 mmol/L (ref 22–32)
CREATININE: 1.38 mg/dL — AB (ref 0.44–1.00)
Calcium: 9.1 mg/dL (ref 8.9–10.3)
Calcium: 8.7 mg/dL — ABNORMAL LOW (ref 8.9–10.3)
Chloride: 108 mmol/L (ref 101–111)
Creatinine, Ser: 1.17 mg/dL — ABNORMAL HIGH (ref 0.44–1.00)
GFR calc Af Amer: 51 mL/min — ABNORMAL LOW (ref >60)
GFR calc non Af Amer: 36 mL/min — ABNORMAL LOW (ref >60)
GFR calc non Af Amer: 44 mL/min — ABNORMAL LOW (ref >60)
GFR, EST AFRICAN AMERICAN: 42 mL/min — AB (ref >60)
Glucose, Bld: 521 mg/dL (ref 65–99)
Glucose, Bld: 293 mg/dL — ABNORMAL HIGH (ref 65–99)
Potassium: 4.6 mmol/L (ref 3.5–5.1)
Potassium: 5.3 mmol/L — ABNORMAL HIGH (ref 3.5–5.1)
Sodium: 142 mmol/L (ref 135–145)
Sodium: 141 mmol/L (ref 135–145)

## 2017-03-29 LAB — CBC
HEMATOCRIT: 37.6 % (ref 36.0–46.0)
Hemoglobin: 12.1 g/dL (ref 12.0–15.0)
MCH: 30.2 pg (ref 26.0–34.0)
MCHC: 32.2 g/dL (ref 30.0–36.0)
MCV: 93.8 fL (ref 78.0–100.0)
PLATELETS: 184 10*3/uL (ref 150–400)
RBC: 4.01 MIL/uL (ref 3.87–5.11)
RDW: 13.5 % (ref 11.5–15.5)
WBC: 15.7 10*3/uL — ABNORMAL HIGH (ref 4.0–10.5)

## 2017-03-29 LAB — URINALYSIS, ROUTINE W REFLEX MICROSCOPIC
BACTERIA UA: NONE SEEN
Bilirubin Urine: NEGATIVE
Hgb urine dipstick: NEGATIVE
Ketones, ur: 5 mg/dL — AB
Leukocytes, UA: NEGATIVE
Nitrite: NEGATIVE
Protein, ur: NEGATIVE mg/dL
RBC / HPF: NONE SEEN RBC/hpf (ref 0–5)
Specific Gravity, Urine: 1.023 (ref 1.005–1.030)
WBC, UA: NONE SEEN WBC/hpf (ref 0–5)
pH: 6 (ref 5.0–8.0)

## 2017-03-29 LAB — SURGICAL PCR SCREEN
MRSA, PCR: NEGATIVE
Staphylococcus aureus: NEGATIVE

## 2017-03-29 LAB — CK TOTAL AND CKMB (NOT AT ARMC)
CK, MB: 6.5 ng/mL — AB (ref 0.5–5.0)
RELATIVE INDEX: 3.1 — AB (ref 0.0–2.5)
Total CK: 207 U/L (ref 38–234)

## 2017-03-29 LAB — TROPONIN I
TROPONIN I: 0.08 ng/mL — AB (ref <0.03)
TROPONIN I: 0.21 ng/mL — AB (ref <0.03)
Troponin I: 0.17 ng/mL (ref <0.03)

## 2017-03-29 LAB — ABO/RH: ABO/RH(D): B NEG

## 2017-03-29 SURGERY — OPEN REDUCTION INTERNAL FIXATION (ORIF) RADIAL FRACTURE
Anesthesia: General | Site: Arm Lower | Laterality: Bilateral

## 2017-03-29 SURGERY — OPEN REDUCTION INTERNAL FIXATION (ORIF) DISTAL RADIUS FRACTURE
Anesthesia: General | Laterality: Bilateral

## 2017-03-29 MED ORDER — ONDANSETRON HCL 4 MG/2ML IJ SOLN: 4.0000 mg | Freq: Once | INTRAMUSCULAR | Status: DC | PRN

## 2017-03-29 MED ORDER — PHENYLEPHRINE 40 MCG/ML (10ML) SYRINGE FOR IV PUSH (FOR BLOOD PRESSURE SUPPORT)
PREFILLED_SYRINGE | INTRAVENOUS | Status: AC
  Filled 2017-03-29: qty 10

## 2017-03-29 MED ORDER — PROPOFOL 10 MG/ML IV BOLUS
INTRAVENOUS | Status: DC | PRN
  Administered 2017-03-29: 100 mg via INTRAVENOUS

## 2017-03-29 MED ORDER — LACTATED RINGERS IV SOLN
INTRAVENOUS | Status: DC | PRN
Start: 2017-03-29 — End: 2017-03-29
  Administered 2017-03-29 (×2): via INTRAVENOUS

## 2017-03-29 MED ORDER — VITAMIN C 500 MG PO TABS
1000.0000 mg | ORAL_TABLET | Freq: Every day | ORAL | Status: DC
  Administered 2017-03-30 – 2017-04-05 (×6): 1000 mg via ORAL
  Filled 2017-03-29 (×6): qty 2

## 2017-03-29 MED ORDER — CEFAZOLIN SODIUM-DEXTROSE 1-4 GM/50ML-% IV SOLN
INTRAVENOUS | Status: AC
  Administered 2017-03-29: 1 g via INTRAVENOUS
  Filled 2017-03-29: qty 50

## 2017-03-29 MED ORDER — SUGAMMADEX SODIUM 200 MG/2ML IV SOLN
INTRAVENOUS | Status: DC | PRN
  Administered 2017-03-29: 200 mg via INTRAVENOUS
  Administered 2017-03-29: 100 mg via INTRAVENOUS

## 2017-03-29 MED ORDER — FENTANYL CITRATE (PF) 250 MCG/5ML IJ SOLN
INTRAMUSCULAR | Status: AC
  Filled 2017-03-29: qty 5

## 2017-03-29 MED ORDER — ASPIRIN 325 MG PO TABS
325.0000 mg | ORAL_TABLET | Freq: Every day | ORAL | Status: DC
  Administered 2017-03-29 – 2017-04-05 (×7): 325 mg via ORAL
  Filled 2017-03-29 (×8): qty 1

## 2017-03-29 MED ORDER — SODIUM CHLORIDE 0.9 % IV SOLN: INTRAVENOUS | Status: DC

## 2017-03-29 MED ORDER — INSULIN DETEMIR 100 UNIT/ML ~~LOC~~ SOLN: 20.0000 [IU] | Freq: Every day | SUBCUTANEOUS | Status: DC

## 2017-03-29 MED ORDER — INSULIN ASPART 100 UNIT/ML ~~LOC~~ SOLN
0.0000 [IU] | Freq: Every day | SUBCUTANEOUS | Status: DC
  Administered 2017-03-30 – 2017-04-01 (×4): 2 [IU] via SUBCUTANEOUS

## 2017-03-29 MED ORDER — CEFAZOLIN SODIUM 1 G IJ SOLR
INTRAMUSCULAR | Status: DC | PRN
  Administered 2017-03-29: 2 g via INTRAMUSCULAR

## 2017-03-29 MED ORDER — ROCURONIUM BROMIDE 100 MG/10ML IV SOLN
INTRAVENOUS | Status: DC | PRN
  Administered 2017-03-29: 20 mg via INTRAVENOUS
  Administered 2017-03-29: 30 mg via INTRAVENOUS
  Administered 2017-03-29 (×2): 20 mg via INTRAVENOUS

## 2017-03-29 MED ORDER — PROPOFOL 10 MG/ML IV BOLUS
INTRAVENOUS | Status: AC
  Filled 2017-03-29: qty 20

## 2017-03-29 MED ORDER — CEFAZOLIN SODIUM 1 G IJ SOLR
INTRAMUSCULAR | Status: AC
  Filled 2017-03-29: qty 20

## 2017-03-29 MED ORDER — INSULIN REGULAR BOLUS VIA INFUSION
0.0000 [IU] | Freq: Three times a day (TID) | INTRAVENOUS | Status: DC
  Filled 2017-03-29: qty 10

## 2017-03-29 MED ORDER — SODIUM CHLORIDE 0.9 % IV SOLN
INTRAVENOUS | Status: DC | PRN
  Administered 2017-03-29: 3.2 [IU]/h via INTRAVENOUS

## 2017-03-29 MED ORDER — BUPIVACAINE HCL (PF) 0.25 % IJ SOLN
INTRAMUSCULAR | Status: AC
  Filled 2017-03-29: qty 30

## 2017-03-29 MED ORDER — LIDOCAINE HCL (CARDIAC) 20 MG/ML IV SOLN
INTRAVENOUS | Status: DC | PRN
  Administered 2017-03-29: 100 mg via INTRAVENOUS

## 2017-03-29 MED ORDER — SUCCINYLCHOLINE CHLORIDE 20 MG/ML IJ SOLN
INTRAMUSCULAR | Status: DC | PRN
  Administered 2017-03-29: 100 mg via INTRAVENOUS

## 2017-03-29 MED ORDER — DEXTROSE 5 % IV SOLN
INTRAVENOUS | Status: DC | PRN
  Administered 2017-03-29: 30 ug/min via INTRAVENOUS

## 2017-03-29 MED ORDER — FENTANYL CITRATE (PF) 100 MCG/2ML IJ SOLN
25.0000 ug | INTRAMUSCULAR | Status: DC | PRN
  Administered 2017-03-29 (×2): 25 ug via INTRAVENOUS

## 2017-03-29 MED ORDER — CEFAZOLIN SODIUM-DEXTROSE 1-4 GM/50ML-% IV SOLN
1.0000 g | Freq: Three times a day (TID) | INTRAVENOUS | Status: AC
  Administered 2017-03-29 – 2017-03-30 (×3): 1 g via INTRAVENOUS
  Filled 2017-03-29 (×4): qty 50

## 2017-03-29 MED ORDER — INSULIN DETEMIR 100 UNIT/ML ~~LOC~~ SOLN
20.0000 [IU] | Freq: Every day | SUBCUTANEOUS | Status: DC
  Administered 2017-03-29: 20 [IU] via SUBCUTANEOUS
  Filled 2017-03-29: qty 0.2

## 2017-03-29 MED ORDER — DEXTROSE 50 % IV SOLN: 25.0000 mL | INTRAVENOUS | Status: DC | PRN

## 2017-03-29 MED ORDER — 0.9 % SODIUM CHLORIDE (POUR BTL) OPTIME
TOPICAL | Status: DC | PRN
  Administered 2017-03-29: 1000 mL

## 2017-03-29 MED ORDER — SODIUM CHLORIDE 0.9 % IV SOLN
INTRAVENOUS | Status: DC
  Administered 2017-03-29: 10.5 [IU]/h via INTRAVENOUS
  Administered 2017-03-29: 7.4 [IU]/h via INTRAVENOUS
  Administered 2017-03-29: 09:00:00 via INTRAVENOUS
  Filled 2017-03-29 (×3): qty 1

## 2017-03-29 MED ORDER — INSULIN ASPART 100 UNIT/ML ~~LOC~~ SOLN
0.0000 [IU] | Freq: Three times a day (TID) | SUBCUTANEOUS | Status: DC
  Administered 2017-03-30: 7 [IU] via SUBCUTANEOUS
  Administered 2017-03-30: 11 [IU] via SUBCUTANEOUS
  Administered 2017-03-30: 15 [IU] via SUBCUTANEOUS
  Administered 2017-03-31: 7 [IU] via SUBCUTANEOUS
  Administered 2017-04-01: 4 [IU] via SUBCUTANEOUS
  Administered 2017-04-01: 7 [IU] via SUBCUTANEOUS
  Administered 2017-04-01 – 2017-04-02 (×2): 3 [IU] via SUBCUTANEOUS
  Administered 2017-04-02 – 2017-04-03 (×2): 7 [IU] via SUBCUTANEOUS
  Administered 2017-04-03: 3 [IU] via SUBCUTANEOUS
  Administered 2017-04-03 – 2017-04-04 (×4): 4 [IU] via SUBCUTANEOUS
  Administered 2017-04-05: 7 [IU] via SUBCUTANEOUS
  Administered 2017-04-05: 11 [IU] via SUBCUTANEOUS

## 2017-03-29 MED ORDER — DEXTROSE-NACL 5-0.45 % IV SOLN
INTRAVENOUS | Status: DC
  Administered 2017-03-29: 19:00:00 via INTRAVENOUS

## 2017-03-29 MED ORDER — FENTANYL CITRATE (PF) 100 MCG/2ML IJ SOLN
INTRAMUSCULAR | Status: AC
  Administered 2017-03-29: 25 ug via INTRAVENOUS
  Filled 2017-03-29: qty 2

## 2017-03-29 MED ORDER — CEFAZOLIN SODIUM-DEXTROSE 1-4 GM/50ML-% IV SOLN
1.0000 g | INTRAVENOUS | Status: AC
  Administered 2017-03-29: 1 g via INTRAVENOUS
  Filled 2017-03-29: qty 50

## 2017-03-29 SURGICAL SUPPLY — 96 items
BANDAGE ACE 3X5.8 VEL STRL LF (GAUZE/BANDAGES/DRESSINGS) ×3 IMPLANT
BANDAGE ACE 4X5 VEL STRL LF (GAUZE/BANDAGES/DRESSINGS) ×3 IMPLANT
BANDAGE ELASTIC 4 VELCRO ST LF (GAUZE/BANDAGES/DRESSINGS) ×3 IMPLANT
BANDAGE ESMARK 6X9 LF (GAUZE/BANDAGES/DRESSINGS) ×1 IMPLANT
BIT DRILL 2 FAST STEP (BIT) ×3 IMPLANT
BIT DRILL 2.2 SS TIBIAL (BIT) ×3 IMPLANT
BIT DRILL 2.5X4 QC (BIT) ×3 IMPLANT
BLADE SURG 15 STRL LF DISP TIS (BLADE) ×2 IMPLANT
BLADE SURG 15 STRL SS (BLADE) ×4
BNDG ESMARK 6X9 LF (GAUZE/BANDAGES/DRESSINGS) ×3
BNDG GAUZE ELAST 4 BULKY (GAUZE/BANDAGES/DRESSINGS) ×3 IMPLANT
CANISTER SUCT 3000ML PPV (MISCELLANEOUS) ×3 IMPLANT
CORDS BIPOLAR (ELECTRODE) ×6 IMPLANT
COTTON STERILE ROLL (GAUZE/BANDAGES/DRESSINGS) ×3 IMPLANT
COVER SURGICAL LIGHT HANDLE (MISCELLANEOUS) ×3 IMPLANT
CUFF TOURNIQUET SINGLE 24IN (TOURNIQUET CUFF) ×6 IMPLANT
CUFF TOURNIQUET SINGLE 34IN LL (TOURNIQUET CUFF) IMPLANT
CUFF TOURNIQUET SINGLE 44IN (TOURNIQUET CUFF) IMPLANT
DRAPE HALF SHEET 40X57 (DRAPES) ×3 IMPLANT
DRAPE INCISE IOBAN 66X45 STRL (DRAPES) IMPLANT
DRAPE OEC MINIVIEW 54X84 (DRAPES) ×3 IMPLANT
DRAPE SURG 17X23 STRL (DRAPES) ×3 IMPLANT
DRSG ADAPTIC 3X8 NADH LF (GAUZE/BANDAGES/DRESSINGS) ×3 IMPLANT
DRSG PAD ABDOMINAL 8X10 ST (GAUZE/BANDAGES/DRESSINGS) ×3 IMPLANT
ELECT REM PT RETURN 9FT ADLT (ELECTROSURGICAL)
ELECTRODE REM PT RTRN 9FT ADLT (ELECTROSURGICAL) IMPLANT
GAUZE SPONGE 4X4 12PLY STRL (GAUZE/BANDAGES/DRESSINGS) ×3 IMPLANT
GAUZE SPONGE 4X4 12PLY STRL LF (GAUZE/BANDAGES/DRESSINGS) ×3 IMPLANT
GAUZE XEROFORM 1X8 LF (GAUZE/BANDAGES/DRESSINGS) ×3 IMPLANT
GLOVE BIO SURGEON STRL SZ8 (GLOVE) ×3 IMPLANT
GLOVE BIOGEL M STRL SZ7.5 (GLOVE) ×3 IMPLANT
GOWN STRL REUS W/ TWL LRG LVL3 (GOWN DISPOSABLE) ×3 IMPLANT
GOWN STRL REUS W/ TWL XL LVL3 (GOWN DISPOSABLE) ×1 IMPLANT
GOWN STRL REUS W/TWL LRG LVL3 (GOWN DISPOSABLE) ×6
GOWN STRL REUS W/TWL XL LVL3 (GOWN DISPOSABLE) ×2
K-WIRE 1.6 (WIRE) ×8
K-WIRE FX5X1.6XNS BN SS (WIRE) ×4
KIT BASIN OR (CUSTOM PROCEDURE TRAY) ×3 IMPLANT
KIT ROOM TURNOVER OR (KITS) ×3 IMPLANT
KWIRE FX5X1.6XNS BN SS (WIRE) ×4 IMPLANT
MANIFOLD NEPTUNE II (INSTRUMENTS) IMPLANT
NEEDLE HYPO 25GX1X1/2 BEV (NEEDLE) ×3 IMPLANT
NS IRRIG 1000ML POUR BTL (IV SOLUTION) ×3 IMPLANT
PACK ORTHO EXTREMITY (CUSTOM PROCEDURE TRAY) ×3 IMPLANT
PAD ARMBOARD 7.5X6 YLW CONV (MISCELLANEOUS) ×9 IMPLANT
PAD CAST 4YDX4 CTTN HI CHSV (CAST SUPPLIES) ×1 IMPLANT
PADDING CAST ABS 4INX4YD NS (CAST SUPPLIES) ×2
PADDING CAST ABS COTTON 4X4 ST (CAST SUPPLIES) ×1 IMPLANT
PADDING CAST COTTON 4X4 STRL (CAST SUPPLIES) ×2
PEG LOCKING SMOOTH 2.2X20 (Screw) ×3 IMPLANT
PEG LOCKING SMOOTH 2.2X22 (Screw) ×3 IMPLANT
PEG LOCKING SMOOTH 2.2X24 (Peg) ×3 IMPLANT
PEG THREADED 2.5MMX20MM LONG (Peg) ×6 IMPLANT
PEG THREADED 2.5MMX22MM LONG (Peg) ×6 IMPLANT
PEG THREADED 2.5MMX24MM LONG (Peg) ×6 IMPLANT
PLATE LONG DVR LEFT (Plate) ×3 IMPLANT
PLATE XLONG 24.4X89.5 RT (Plate) ×3 IMPLANT
PUTTY DBM STAGRAFT PLUS 5CC (Putty) ×6 IMPLANT
SCREW  LP NL 2.7X15MM (Screw) ×6 IMPLANT
SCREW 2.7X14MM (Screw) ×2 IMPLANT
SCREW BN 14X2.7XNONLOCK 3 LD (Screw) ×1 IMPLANT
SCREW CORT 3.5X14 LNG (Screw) ×3 IMPLANT
SCREW CORT 3.5X15 (Screw) ×12 IMPLANT
SCREW LOCK 14X2.7X 3 LD TPR (Screw) ×2 IMPLANT
SCREW LOCK 16X2.7X 3 LD TPR (Screw) ×2 IMPLANT
SCREW LOCK 20X2.7X 3 LD TPR (Screw) ×1 IMPLANT
SCREW LOCK 22X2.7X 3 LD TPR (Screw) ×1 IMPLANT
SCREW LOCK 24X2.7X3 LD THRD (Screw) ×1 IMPLANT
SCREW LOCKING 2.7X14 (Screw) ×4 IMPLANT
SCREW LOCKING 2.7X15MM (Screw) ×3 IMPLANT
SCREW LOCKING 2.7X16 (Screw) ×4 IMPLANT
SCREW LOCKING 2.7X20MM (Screw) ×2 IMPLANT
SCREW LOCKING 2.7X22MM (Screw) ×2 IMPLANT
SCREW LOCKING 2.7X24MM (Screw) ×2 IMPLANT
SCREW LP NL 2.7X15MM (Screw) ×3 IMPLANT
SCREW MULTI DIRECT 18MM (Screw) ×3 IMPLANT
SCREW MULTI DIRECTIONAL 2.7X20 (Screw) ×3 IMPLANT
SPLINT FIBERGLASS 3X35 (CAST SUPPLIES) ×6 IMPLANT
SPONGE LAP 4X18 X RAY DECT (DISPOSABLE) ×6 IMPLANT
STAPLER VISISTAT 35W (STAPLE) ×3 IMPLANT
SUCTION FRAZIER HANDLE 10FR (MISCELLANEOUS) ×6
SUCTION TUBE FRAZIER 10FR DISP (MISCELLANEOUS) ×3 IMPLANT
SUT ETHILON 2 0 FS 18 (SUTURE) IMPLANT
SUT ETHILON 4 0 PS 2 18 (SUTURE) IMPLANT
SUT PROLENE 3 0 PS 1 (SUTURE) ×15 IMPLANT
SUT VIC AB 3-0 CT1 27 (SUTURE)
SUT VIC AB 3-0 CT1 TAPERPNT 27 (SUTURE) IMPLANT
SUT VIC AB 3-0 FS2 27 (SUTURE) ×9 IMPLANT
SYR CONTROL 10ML LL (SYRINGE) ×3 IMPLANT
TOWEL OR 17X24 6PK STRL BLUE (TOWEL DISPOSABLE) ×3 IMPLANT
TOWEL OR 17X26 10 PK STRL BLUE (TOWEL DISPOSABLE) ×3 IMPLANT
TUBE CONNECTING 12'X1/4 (SUCTIONS) ×2
TUBE CONNECTING 12X1/4 (SUCTIONS) ×4 IMPLANT
WATER STERILE IRR 1000ML POUR (IV SOLUTION) IMPLANT
WIRE K 1.1 (MISCELLANEOUS) ×6 IMPLANT
YANKAUER SUCT BULB TIP NO VENT (SUCTIONS) ×3 IMPLANT

## 2017-03-29 NOTE — Progress Notes (Signed)
Patient was seen and examined this morning. The patient had a elevated serum blood sugars all night. Patient had a rising troponin levels. Cardiology was consult of this morning. We will await cardiology consultation would like to proceed with open reduction internal fixation of both wrists this day. Care was coordinated between the cardiologist and the internal medicine service. Continue nothing by mouth the case is not canceled today. Serum blood sugar management will be important in the overall management of this patient.

## 2017-03-29 NOTE — Consult Note (Signed)
Reason for Consult: Right and left distal radius fractures Referring Physician: [facility] hospital[facility]  Christy [name] is an 76 y.o. female.  HPI: The patient is a right-hand-dominant female who fell yesterday coming out of church. Patient sustained the injuries to both her right and left distal radius as well as her right patella. Patient was seen and evaluated in the emergency department. Patient was admitted overnight for pain control as well as medical stabilization. The patient was seen by cardiology this morning for elevated troponins. The patient's blood sugar was elevated and that was being managed by internal medicine. The patient complained of bilateral wrist pain. Patient had previous surgery on her right wrist back in 2010 or a volar plate was applied.  Past Medical History:  Diagnosis Date  . Abnormal ECG    Inferior Q waves  . Cancer ([place])    stage II right breast cancer  . Cataract   . Diabetes mellitus   . Hypercholesterolemia   . Hyperlipidemia   . Hypertension   . Osteoporosis   . Overactive bladder     Past Surgical History:  Procedure Laterality Date  . BREAST SURGERY     right lumpectomy, sentinel node biopsy  . CATARACT EXTRACTION Bilateral 2008  . FRACTURE SURGERY     ORIF right distal radius fx  . JOINT REPLACEMENT     right hip  . PORT-A-CATH REMOVAL  09/11/2011   Procedure: REMOVAL PORT-A-CATH;  Surgeon: Rolm Bookbinder, MD;  Location: [facility];  Service: General;  Laterality: Left;  local   . PORTACATH PLACEMENT      Family History  Problem Relation Age of Onset  . Stroke Mother   . Stroke Father   . Hypertension Sister   . Stroke Sister   . Stroke Brother     Social History:  reports that she quit smoking about 4 years ago. Her smoking use included Cigarettes. She has never used smokeless tobacco. She reports that she does not drink alcohol or use drugs.  Allergies: No Known Allergies  Medications: I have reviewed the patient's  current medications.  Results for orders placed or performed during the hospital encounter of 03/28/17 (from the past 48 hour(s))  CBG monitoring, ED     Status: Abnormal   Collection Time: 03/28/17  3:45 PM  Result Value Ref Range   Glucose-Capillary 519 (HH) 65 - 99 mg/dL  Basic metabolic panel     Status: Abnormal   Collection Time: 03/28/17  4:45 PM  Result Value Ref Range   Sodium 135 135 - 145 mmol/L   Potassium 5.1 3.5 - 5.1 mmol/L   Chloride 98 (L) 101 - 111 mmol/L   CO2 23 22 - 32 mmol/L   Glucose, Bld 546 (HH) 65 - 99 mg/dL    Comment: CRITICAL RESULT CALLED TO, READ BACK BY AND VERIFIED WITH: RN M SCRUGGS AT [id number] MARTINB    BUN 44 (H) 6 - 20 mg/dL   Creatinine, Ser 1.70 (H) 0.44 - 1.00 mg/dL   Calcium 9.7 8.9 - 10.3 mg/dL   GFR calc non Af Amer 28 (L) >60 mL/min   GFR calc Af Amer 33 (L) >60 mL/min    Comment: (NOTE) The eGFR has been calculated using the CKD EPI equation. This calculation has not been validated in all clinical situations. eGFR's persistently <60 mL/min signify possible Chronic Kidney Disease.    Anion gap 14 5 - 15  CBC with Differential     Status: Abnormal  Collection Time: 03/28/17  4:45 PM  Result Value Ref Range   WBC 13.5 (H) 4.0 - 10.5 K/uL   RBC 4.40 3.87 - 5.11 MIL/uL   Hemoglobin 13.5 12.0 - 15.0 g/dL   HCT 40.7 36.0 - 46.0 %   MCV 92.5 78.0 - 100.0 fL   MCH 30.7 26.0 - 34.0 pg   MCHC 33.2 30.0 - 36.0 g/dL   RDW 13.6 11.5 - 15.5 %   Platelets 192 150 - 400 K/uL   Neutrophils Relative % 86 %   Neutro Abs 11.6 (H) 1.7 - 7.7 K/uL   Lymphocytes Relative 8 %   Lymphs Abs 1.1 0.7 - 4.0 K/uL   Monocytes Relative 6 %   Monocytes Absolute 0.8 0.1 - 1.0 K/uL   Eosinophils Relative 0 %   Eosinophils Absolute 0.0 0.0 - 0.7 K/uL   Basophils Relative 0 %   Basophils Absolute 0.0 0.0 - 0.1 K/uL  Brain natriuretic peptide     Status: None   Collection Time: 03/28/17  4:45 PM  Result Value Ref Range   B Natriuretic Peptide 14.0  0.0 - 100.0 pg/mL  POC CBG, ED     Status: Abnormal   Collection Time: 03/28/17  6:55 PM  Result Value Ref Range   Glucose-Capillary 487 (H) 65 - 99 mg/dL  I-Stat venous blood gas, ED     Status: Abnormal   Collection Time: 03/28/17  7:27 PM  Result Value Ref Range   pH, Ven 7.429 7.250 - 7.430   pCO2, Ven 40.6 (L) 44.0 - 60.0 mmHg   pO2, Ven 63.0 (H) 32.0 - 45.0 mmHg   Bicarbonate 26.9 20.0 - 28.0 mmol/L   TCO2 28 0 - 100 mmol/L   O2 Saturation 92.0 %   Acid-Base Excess 2.0 0.0 - 2.0 mmol/L   Patient temperature HIDE    Sample type VENOUS   POC CBG, ED     Status: Abnormal   Collection Time: 03/28/17  7:42 PM  Result Value Ref Range   Glucose-Capillary 468 (H) 65 - 99 mg/dL  Type and screen [place]     Status: None   Collection Time: 03/28/17  8:23 PM  Result Value Ref Range   ABO/RH(D) B NEG    Antibody Screen NEG    Sample Expiration 03/31/2017   Protime-INR     Status: None   Collection Time: 03/28/17  8:34 PM  Result Value Ref Range   Prothrombin Time 13.1 11.4 - 15.2 seconds   INR 0.99   Troponin I     Status: Abnormal   Collection Time: 03/28/17  8:34 PM  Result Value Ref Range   Troponin I 0.03 (HH) <0.03 ng/mL    Comment: CRITICAL RESULT CALLED TO, READ BACK BY AND VERIFIED WITH: WARD C,RN 03/28/17 2135 WAYK   ABO/Rh     Status: None   Collection Time: 03/28/17  8:45 PM  Result Value Ref Range   ABO/RH(D) B NEG   Surgical PCR screen     Status: None   Collection Time: 03/28/17 11:54 PM  Result Value Ref Range   MRSA, PCR NEGATIVE NEGATIVE   Staphylococcus aureus NEGATIVE NEGATIVE    Comment:        The Xpert SA Assay (FDA approved for NASAL specimens in patients over 48 years of age), is one component of a comprehensive surveillance program.  Test performance has been validated by [facility] for patients greater than or equal to 36 year old.  It is not intended to diagnose infection nor to guide or monitor treatment.    Urinalysis, Routine w reflex microscopic     Status: Abnormal   Collection Time: 03/29/17  1:13 AM  Result Value Ref Range   Color, Urine YELLOW YELLOW   APPearance CLEAR CLEAR   Specific Gravity, Urine 1.023 1.005 - 1.030   pH 6.0 5.0 - 8.0   Glucose, UA >=500 (A) NEGATIVE mg/dL   Hgb urine dipstick NEGATIVE NEGATIVE   Bilirubin Urine NEGATIVE NEGATIVE   Ketones, ur 5 (A) NEGATIVE mg/dL   Protein, ur NEGATIVE NEGATIVE mg/dL   Nitrite NEGATIVE NEGATIVE   Leukocytes, UA NEGATIVE NEGATIVE   RBC / HPF NONE SEEN 0 - 5 RBC/hpf   WBC, UA NONE SEEN 0 - 5 WBC/hpf   Bacteria, UA NONE SEEN NONE SEEN   Squamous Epithelial / LPF 0-5 (A) NONE SEEN  Troponin I (q 6hr x 3)     Status: Abnormal   Collection Time: 03/29/17  1:32 AM  Result Value Ref Range   Troponin I 0.08 (HH) <0.03 ng/mL    Comment: CRITICAL VALUE NOTED.  VALUE IS CONSISTENT WITH PREVIOUSLY REPORTED AND CALLED VALUE.  Glucose, capillary     Status: Abnormal   Collection Time: 03/29/17  5:28 AM  Result Value Ref Range   Glucose-Capillary 485 (H) 65 - 99 mg/dL  Basic metabolic panel     Status: Abnormal   Collection Time: 03/29/17  6:23 AM  Result Value Ref Range   Sodium 142 135 - 145 mmol/L   Potassium 4.6 3.5 - 5.1 mmol/L   Chloride 103 101 - 111 mmol/L   CO2 25 22 - 32 mmol/L   Glucose, Bld 521 (HH) 65 - 99 mg/dL    Comment: CRITICAL RESULT CALLED TO, READ BACK BY AND VERIFIED WITH: M.KNICK,RN 0742 03/29/17 CLARK,S    BUN 39 (H) 6 - 20 mg/dL   Creatinine, Ser 1.38 (H) 0.44 - 1.00 mg/dL   Calcium 9.1 8.9 - 10.3 mg/dL   GFR calc non Af Amer 36 (L) >60 mL/min   GFR calc Af Amer 42 (L) >60 mL/min    Comment: (NOTE) The eGFR has been calculated using the CKD EPI equation. This calculation has not been validated in all clinical situations. eGFR's persistently <60 mL/min signify possible Chronic Kidney Disease.    Anion gap 14 5 - 15  CBC     Status: Abnormal   Collection Time: 03/29/17  6:23 AM  Result Value Ref  Range   WBC 15.7 (H) 4.0 - 10.5 K/uL   RBC 4.01 3.87 - 5.11 MIL/uL   Hemoglobin 12.1 12.0 - 15.0 g/dL   HCT 37.6 36.0 - 46.0 %   MCV 93.8 78.0 - 100.0 fL   MCH 30.2 26.0 - 34.0 pg   MCHC 32.2 30.0 - 36.0 g/dL   RDW 13.5 11.5 - 15.5 %   Platelets 184 150 - 400 K/uL  Troponin I (q 6hr x 3)     Status: Abnormal   Collection Time: 03/29/17  6:23 AM  Result Value Ref Range   Troponin I 0.21 (HH) <0.03 ng/mL    Comment: CRITICAL RESULT CALLED TO, READ BACK BY AND VERIFIED WITH: M.KNICK,RN 0742 03/29/17 CLARK,S   Glucose, capillary     Status: Abnormal   Collection Time: 03/29/17  8:06 AM  Result Value Ref Range   Glucose-Capillary 379 (H) 65 - 99 mg/dL  CK total and CKMB (cardiac)not at [facility]  Status: Abnormal   Collection Time: 03/29/17  8:22 AM  Result Value Ref Range   Total CK 207 38 - 234 U/L   CK, MB 6.5 (H) 0.5 - 5.0 ng/mL   Relative Index 3.1 (H) 0.0 - 2.5  Glucose, capillary     Status: Abnormal   Collection Time: 03/29/17 10:17 AM  Result Value Ref Range   Glucose-Capillary 357 (H) 65 - 99 mg/dL  Glucose, capillary     Status: Abnormal   Collection Time: 03/29/17 11:17 AM  Result Value Ref Range   Glucose-Capillary 328 (H) 65 - 99 mg/dL  Glucose, capillary     Status: Abnormal   Collection Time: 03/29/17 12:21 PM  Result Value Ref Range   Glucose-Capillary 321 (H) 65 - 99 mg/dL    Dg Chest 1 View  Result Date: 03/28/2017 CLINICAL DATA:  Legs gave out, patient fell, deformities to LEFT and RIGHT wrists, laceration to bridge of nose, LEFT knee abrasion EXAM: CHEST 1 VIEW COMPARISON:  07/02/2010 FINDINGS: Enlargement of cardiac silhouette with slight vascular congestion. Mediastinal contours normal. Interval removal of Port-A-Cath. Atherosclerotic calcification aorta. Mild bibasilar atelectasis versus infiltrate. Upper lungs clear. No pleural effusion or pneumothorax. Bones demineralized. IMPRESSION: Enlargement of cardiac silhouette with pulmonary vascular congestion.  Mild bibasilar atelectasis versus infiltrate. Electronically Signed   By: Lavonia Dana M.D.   On: 03/28/2017 18:27   Dg Wrist Complete Left  Result Date: 03/28/2017 CLINICAL DATA:  76 year old female with history of trauma from a fall complaining of pain in the left wrist. EXAM: LEFT WRIST - COMPLETE 3+ VIEW COMPARISON:  Left hand radiograph 06/13/2009. FINDINGS: Highly comminuted fractures of the distal radius and ulna are noted. The radial fracture is highly comminuted, intra-articular, impacted and displaced, with extension of the fracture through the metaphyseal region into the distal diaphyseal region. Individual fracture fragments are displaced in a random distribution, generally extending outward from the distal radius. There is some very mild volar angulation on the cross-table lateral view (approximately 15-20 degrees). Distal ulnar fracture is also highly comminuted, with radial displacement of the distal ulna approximately 1 to 1-1/2 shaft width. There also appears to be some distal migration of the proximal ulnar fragment into the overlying soft tissues which are markedly swollen. The carpal some cells appear intact. IMPRESSION: 1. Highly comminuted, displaced and angulated intra-articular fractures of the distal radius and ulna, as detailed above. Electronically Signed   By: Vinnie Langton M.D.   On: 03/28/2017 18:27   Dg Wrist Complete Right  Result Date: 03/28/2017 CLINICAL DATA:  Recent fall with wrist pain, initial encounter EXAM: RIGHT WRIST - COMPLETE 3+ VIEW COMPARISON:  06/11/2009 FINDINGS: Fixation sideplate is noted along the distal radius in an area of prior fracture. A new fracture is noted in the radial metaphysis with posterior angulation. Additionally there is fracture of the fixation sideplate distally at the area of fracture. Deformity of the distal ulna is noted related to prior fracture and healing. No other are seen. IMPRESSION: Changes consistent with previous healed radial  and ulnar fractures with surgical fixation. New radial metaphyseal fracture with posterior angulation and fracture of the fixation sideplate. Electronically Signed   By: Inez Catalina M.D.   On: 03/28/2017 18:26   Ct Head Wo Contrast  Result Date: 03/28/2017 CLINICAL DATA:  Recent fall with facial injury and headaches, initial encounter EXAM: CT HEAD WITHOUT CONTRAST CT MAXILLOFACIAL WITHOUT CONTRAST TECHNIQUE: Multidetector CT imaging of the head and maxillofacial structures were performed using the standard  protocol without intravenous contrast. Multiplanar CT image reconstructions of the maxillofacial structures were also generated. COMPARISON:  06/11/2009 FINDINGS: CT HEAD FINDINGS Brain: Diffuse atrophic changes are noted. No findings to suggest acute hemorrhage, acute infarction or space-occupying mass lesion are seen. Areas of chronic white matter ischemic change are noted stable from the prior exam. Vascular: No hyperdense vessel or unexpected calcification. Skull: The skull is intact. Multiple fractures are noted involving the nasal bones as well as the maxillary antra bilaterally. These will be better evaluated on the maxillofacial CT. Other: Air-fluid levels are noted in the maxillary antra bilaterally. CT MAXILLOFACIAL FINDINGS Osseous: Bilateral nasal bone fractures are noted with mild impaction. Fractures of the pterygoid plates are noted with mild displacement bilaterally. Fracture lines are also noted within the anterior and lateral walls of the maxillary antra bilaterally. Involvement of the posterior walls of the maxillary antra are noted as well. No definitive orbital fracture is seen. No muscular entrapment noted. Zygomatic arches are within normal limits. Orbits: The orbits and their contents are within normal limits. Sinuses: Considerable soft tissue swelling is noted within the nasal passages. Air-fluid levels are noted within the maxillary antra bilaterally. Soft tissues: Mild soft tissue  swelling is noted in the right forehead. No sizable hematoma seen. No other soft tissue abnormality is noted. IMPRESSION: CT of the maxillofacial bones: Multiple fractures involving the maxillary antra bilaterally as well as the pterygoid plates bilaterally. No definitive orbital involvement is noted. Air-fluid levels within the maxillary antra bilaterally. CT of the head: Chronic changes without acute abnormality. Electronically Signed   By: Inez Catalina M.D.   On: 03/28/2017 17:53   Dg Knee Complete 4 Views Left  Result Date: 03/28/2017 CLINICAL DATA:  Recent fall with knee pain, initial encounter EXAM: LEFT KNEE - COMPLETE 4+ VIEW COMPARISON:  10/12/2016 FINDINGS: There is a transverse fracture through the midportion of the patella with mild distraction of the fracture fragments of approximately 1 cm. Overlying soft tissue swelling and small joint effusion is noted. No other fracture or dislocation is seen. IMPRESSION: Patellar fracture with small joint effusion. No other bony abnormality is seen. Electronically Signed   By: Inez Catalina M.D.   On: 03/28/2017 18:24   Ct Maxillofacial Wo Cm  Result Date: 03/28/2017 CLINICAL DATA:  Recent fall with facial injury and headaches, initial encounter EXAM: CT HEAD WITHOUT CONTRAST CT MAXILLOFACIAL WITHOUT CONTRAST TECHNIQUE: Multidetector CT imaging of the head and maxillofacial structures were performed using the standard protocol without intravenous contrast. Multiplanar CT image reconstructions of the maxillofacial structures were also generated. COMPARISON:  06/11/2009 FINDINGS: CT HEAD FINDINGS Brain: Diffuse atrophic changes are noted. No findings to suggest acute hemorrhage, acute infarction or space-occupying mass lesion are seen. Areas of chronic white matter ischemic change are noted stable from the prior exam. Vascular: No hyperdense vessel or unexpected calcification. Skull: The skull is intact. Multiple fractures are noted involving the nasal bones  as well as the maxillary antra bilaterally. These will be better evaluated on the maxillofacial CT. Other: Air-fluid levels are noted in the maxillary antra bilaterally. CT MAXILLOFACIAL FINDINGS Osseous: Bilateral nasal bone fractures are noted with mild impaction. Fractures of the pterygoid plates are noted with mild displacement bilaterally. Fracture lines are also noted within the anterior and lateral walls of the maxillary antra bilaterally. Involvement of the posterior walls of the maxillary antra are noted as well. No definitive orbital fracture is seen. No muscular entrapment noted. Zygomatic arches are within normal limits. Orbits: The  orbits and their contents are within normal limits. Sinuses: Considerable soft tissue swelling is noted within the nasal passages. Air-fluid levels are noted within the maxillary antra bilaterally. Soft tissues: Mild soft tissue swelling is noted in the right forehead. No sizable hematoma seen. No other soft tissue abnormality is noted. IMPRESSION: CT of the maxillofacial bones: Multiple fractures involving the maxillary antra bilaterally as well as the pterygoid plates bilaterally. No definitive orbital involvement is noted. Air-fluid levels within the maxillary antra bilaterally. CT of the head: Chronic changes without acute abnormality. Electronically Signed   By: Inez Catalina M.D.   On: 03/28/2017 17:53    ROS as noted in the medical chart Blood pressure (!) 144/47, pulse 87, temperature 97.8 F (36.6 C), temperature source Oral, resp. rate 18, height _0  (1.702 m), weight 224 lb 15.7 oz (102 kg), SpO2 95 %. Physical Exam  General Appearance:  Alert, cooperative, no distress, appears stated age  Head:  Normocephalic, without obvious abnormality, Periorbital ecchymosis as well as trauma to the nasal region   Eyes:          Throat:   Neck: No visible masses     Lungs:   respirations unlabored  Chest Wall:  No tenderness or deformity  Heart:  Regular rate  and rhythm,  Abdomen:   Soft, non-tender,         Extremities: Bilateral upper extremities are in sugar tong splints. She is able to gently wiggle her fingers.   Pulses:   Skin:      Neurologic: Normal    Assessment/Plan: Right wrist comminuted intra-articular distal radius fracture with displacement and the volar plate in place Left wrist comminuted intra-articular distal radius fracture with significant metaphyseal comminution and displacement Osteoporosis with history of breast cancer  The patient does have significant injuries to both wrists. It is my recommendation that the patient undergo operative stabilization of both wrists. Risks of surgery include but not limited to bleeding infection damage to nearby nerves arteries or tendons loss of motion of the wrists and digits incomplete relief of symptoms persistent pain limitations in her movement and need for further surgical intervention.  WE ARE PLANNING SURGERY FOR YOUR UPPER EXTREMITY. THE RISKS AND BENEFITS OF SURGERY INCLUDE BUT NOT LIMITED TO BLEEDING INFECTION, DAMAGE TO NEARBY NERVES ARTERIES TENDONS, FAILURE OF SURGERY TO ACCOMPLISH ITS INTENDED GOALS, PERSISTENT SYMPTOMS AND NEED FOR FURTHER SURGICAL INTERVENTION. WITH THIS IN MIND WE WILL PROCEED. I HAVE DISCUSSED WITH THE PATIENT THE PRE AND POSTOPERATIVE REGIMEN AND THE DOS AND DON'TS. PT VOICED UNDERSTANDING AND INFORMED CONSENT SIGNED.  Linna Hoff 03/29/2017, 1:47 PM

## 2017-03-29 NOTE — Consult Note (Signed)
Reason for Consult: Facial fractures  HPI:  Christy [name] is a 76 y.o. female who was admitted yesterday for injuries sustained in an accidental fall. Pt reports that she felt like her left knee went out from under her. Denies any prodromal symptoms such as chest pain, shortness of breath, palpitations, headache, vision changes. [place] to the ground. Immediate pain in bilateral wrists. She did hit her head on the ground. No loss consciousness. Reporting pain in bilateral wrists, left knee, and face. Facial CT shows bilateral nasal and maxillary fractures. No significant displacement.  Past Medical History:  Diagnosis Date  . Abnormal ECG    Inferior Q waves  . Cancer ([place])    stage II right breast cancer  . Cataract   . Diabetes mellitus   . Hypercholesterolemia   . Hyperlipidemia   . Hypertension   . Osteoporosis   . Overactive bladder     Past Surgical History:  Procedure Laterality Date  . BREAST SURGERY     right lumpectomy, sentinel node biopsy  . CATARACT EXTRACTION Bilateral 2008  . FRACTURE SURGERY     ORIF right distal radius fx  . JOINT REPLACEMENT     right hip  . PORT-A-CATH REMOVAL  09/11/2011   Procedure: REMOVAL PORT-A-CATH;  Surgeon: Rolm Bookbinder, MD;  Location: [place];  Service: General;  Laterality: Left;  local   . PORTACATH PLACEMENT      Family History  Problem Relation Age of Onset  . Stroke Mother   . Stroke Father   . Hypertension Sister   . Stroke Sister   . Stroke Brother     Social History:  reports that she quit smoking about 4 years ago. Her smoking use included Cigarettes. She has never used smokeless tobacco. She reports that she does not drink alcohol or use drugs.  Allergies: No Known Allergies  Prior to Admission medications   Medication Sig Start Date End Date Taking? Authorizing Provider  alendronate (FOSAMAX) 70 MG tablet Take 1 tablet (70 mg total) by mouth every 7 (seven) days. Take with a full glass of  water on an empty stomach. Patient taking differently: Take 70 mg by mouth every Monday. Take with a full glass of water on an empty stomach. 12/31/16  Yes [name], [name], MD  amLODipine (NORVASC) 5 MG tablet Take 1 tablet (5 mg total) by mouth daily. 12/31/16  Yes [name], [name], MD  Cholecalciferol (VITAMIN D) 2000 units tablet Take 2,000 Units by mouth daily.   Yes [provider]  fenofibrate (TRICOR) 48 MG tablet Take 1 tablet (48 mg total) by mouth daily. 12/31/16  Yes [name], [name], MD  furosemide (LASIX) 40 MG tablet Take 1 tablet (40 mg total) by mouth daily. 12/31/16  Yes [name], [name], MD  ibuprofen (ADVIL,MOTRIN) 800 MG tablet Take 800 mg by mouth every 8 (eight) hours as needed for fever or moderate pain.  02/28/13  Yes [provider]  insulin aspart (NOVOLOG) 100 UNIT/ML injection Inject 30 Units into the skin 2 (two) times daily with a meal.   Yes [provider]  insulin detemir (LEVEMIR) 100 UNIT/ML injection Inject 0.2 mLs (20 Units total) into the skin at bedtime. Patient taking differently: Inject 26 Units into the skin at bedtime.  12/31/16  Yes [name], [name], MD  lisinopril (PRINIVIL,ZESTRIL) 40 MG tablet Take 1 tablet (40 mg total) by mouth daily. 12/31/16  Yes [name], [name], MD  metFORMIN (GLUCOPHAGE-XR) 500 MG 24 hr tablet Take 2 tablets (1,000  mg total) by mouth 2 (two) times daily. 01/13/17  Yes [name], Earna Coder, MD  Omega-3 Fatty Acids (FISH OIL) 1200 MG CAPS Take 1,200 mg by mouth 2 (two) times daily.   Yes [provider]  simvastatin (ZOCOR) 40 MG tablet TAKE 1 TABLET BY MOUTH ONCE DAILY 02/02/17  Yes [name], [name], MD  solifenacin (VESICARE) 10 MG tablet Take 1 tablet (10 mg total) by mouth daily. 12/31/16  Yes [name], [name], MD  furosemide (LASIX) 20 MG tablet Take 20 mg in the morning, in addition to your nightly dose. 20 mg twice a day for 5 days, then resume 20 daily Patient not taking: Reported on 03/28/2017 03/21/16    Tanna Furry, MD  glimepiride (AMARYL) 2 MG tablet Take 1 tablet (2 mg total) by mouth daily before breakfast. Patient not taking: Reported on 03/28/2017 12/31/16   [name], MD  potassium chloride (K-DUR) 10 MEQ tablet Take 1 tablet (10 mEq total) by mouth 2 (two) times daily. Patient not taking: Reported on 08/03/2016 03/21/16   Tanna Furry, MD    Medications:  I have reviewed the patient's current medications. Scheduled: . aspirin  325 mg Oral Daily  . fentaNYL (SUBLIMAZE) injection  25 mcg Intravenous Once  . insulin regular  0-10 Units Intravenous TID WC  . midazolam  1 mg Intravenous Once   Continuous: . sodium chloride 75 mL/hr at 03/28/17 2333  . sodium chloride    .  ceFAZolin (ANCEF) IV    . dextrose 5 % and 0.45% NaCl    . insulin (NOVOLIN-R) infusion     IWO:EHOZYYQM, fentaNYL (SUBLIMAZE) injection, HYDROcodone-acetaminophen, ondansetron (ZOFRAN) IV, promethazine  Dg Chest 1 View  Result Date: 03/28/2017 CLINICAL DATA:  Legs gave out, patient fell, deformities to LEFT and RIGHT wrists, laceration to bridge of nose, LEFT knee abrasion EXAM: CHEST 1 VIEW COMPARISON:  07/02/2010 FINDINGS: Enlargement of cardiac silhouette with slight vascular congestion. Mediastinal contours normal. Interval removal of Port-A-Cath. Atherosclerotic calcification aorta. Mild bibasilar atelectasis versus infiltrate. Upper lungs clear. No pleural effusion or pneumothorax. Bones demineralized. IMPRESSION: Enlargement of cardiac silhouette with pulmonary vascular congestion. Mild bibasilar atelectasis versus infiltrate. Electronically Signed   By: Lavonia Dana M.D.   On: 03/28/2017 18:27   Dg Wrist Complete Left  Result Date: 03/28/2017 CLINICAL DATA:  76 year old female with history of trauma from a fall complaining of pain in the left wrist. EXAM: LEFT WRIST - COMPLETE 3+ VIEW COMPARISON:  Left hand radiograph 06/13/2009. FINDINGS: Highly comminuted fractures of the distal radius and ulna are  noted. The radial fracture is highly comminuted, intra-articular, impacted and displaced, with extension of the fracture through the metaphyseal region into the distal diaphyseal region. Individual fracture fragments are displaced in a random distribution, generally extending outward from the distal radius. There is some very mild volar angulation on the cross-table lateral view (approximately 15-20 degrees). Distal ulnar fracture is also highly comminuted, with radial displacement of the distal ulna approximately 1 to 1-1/2 shaft width. There also appears to be some distal migration of the proximal ulnar fragment into the overlying soft tissues which are markedly swollen. The carpal some cells appear intact. IMPRESSION: 1. Highly comminuted, displaced and angulated intra-articular fractures of the distal radius and ulna, as detailed above. Electronically Signed   By: Vinnie Langton M.D.   On: 03/28/2017 18:27   Dg Wrist Complete Right  Result Date: 03/28/2017 CLINICAL DATA:  Recent fall with wrist pain, initial encounter EXAM: RIGHT WRIST - COMPLETE 3+ VIEW COMPARISON:  06/11/2009 FINDINGS: Fixation sideplate is noted along the distal radius in an area of prior fracture. A new fracture is noted in the radial metaphysis with posterior angulation. Additionally there is fracture of the fixation sideplate distally at the area of fracture. Deformity of the distal ulna is noted related to prior fracture and healing. No other are seen. IMPRESSION: Changes consistent with previous healed radial and ulnar fractures with surgical fixation. New radial metaphyseal fracture with posterior angulation and fracture of the fixation sideplate. Electronically Signed   By: Inez Catalina M.D.   On: 03/28/2017 18:26   Ct Head Wo Contrast  Result Date: 03/28/2017 CLINICAL DATA:  Recent fall with facial injury and headaches, initial encounter EXAM: CT HEAD WITHOUT CONTRAST CT MAXILLOFACIAL WITHOUT CONTRAST TECHNIQUE:  Multidetector CT imaging of the head and maxillofacial structures were performed using the standard protocol without intravenous contrast. Multiplanar CT image reconstructions of the maxillofacial structures were also generated. COMPARISON:  06/11/2009 FINDINGS: CT HEAD FINDINGS Brain: Diffuse atrophic changes are noted. No findings to suggest acute hemorrhage, acute infarction or space-occupying mass lesion are seen. Areas of chronic white matter ischemic change are noted stable from the prior exam. Vascular: No hyperdense vessel or unexpected calcification. Skull: The skull is intact. Multiple fractures are noted involving the nasal bones as well as the maxillary antra bilaterally. These will be better evaluated on the maxillofacial CT. Other: Air-fluid levels are noted in the maxillary antra bilaterally. CT MAXILLOFACIAL FINDINGS Osseous: Bilateral nasal bone fractures are noted with mild impaction. Fractures of the pterygoid plates are noted with mild displacement bilaterally. Fracture lines are also noted within the anterior and lateral walls of the maxillary antra bilaterally. Involvement of the posterior walls of the maxillary antra are noted as well. No definitive orbital fracture is seen. No muscular entrapment noted. Zygomatic arches are within normal limits. Orbits: The orbits and their contents are within normal limits. Sinuses: Considerable soft tissue swelling is noted within the nasal passages. Air-fluid levels are noted within the maxillary antra bilaterally. Soft tissues: Mild soft tissue swelling is noted in the right forehead. No sizable hematoma seen. No other soft tissue abnormality is noted. IMPRESSION: CT of the maxillofacial bones: Multiple fractures involving the maxillary antra bilaterally as well as the pterygoid plates bilaterally. No definitive orbital involvement is noted. Air-fluid levels within the maxillary antra bilaterally. CT of the head: Chronic changes without acute abnormality.  Electronically Signed   By: Inez Catalina M.D.   On: 03/28/2017 17:53   Dg Knee Complete 4 Views Left  Result Date: 03/28/2017 CLINICAL DATA:  Recent fall with knee pain, initial encounter EXAM: LEFT KNEE - COMPLETE 4+ VIEW COMPARISON:  10/12/2016 FINDINGS: There is a transverse fracture through the midportion of the patella with mild distraction of the fracture fragments of approximately 1 cm. Overlying soft tissue swelling and small joint effusion is noted. No other fracture or dislocation is seen. IMPRESSION: Patellar fracture with small joint effusion. No other bony abnormality is seen. Electronically Signed   By: Inez Catalina M.D.   On: 03/28/2017 18:24   Ct Maxillofacial Wo Cm  Result Date: 03/28/2017 CLINICAL DATA:  Recent fall with facial injury and headaches, initial encounter EXAM: CT HEAD WITHOUT CONTRAST CT MAXILLOFACIAL WITHOUT CONTRAST TECHNIQUE: Multidetector CT imaging of the head and maxillofacial structures were performed using the standard protocol without intravenous contrast. Multiplanar CT image reconstructions of the maxillofacial structures were also generated. COMPARISON:  06/11/2009 FINDINGS: CT HEAD FINDINGS Brain: Diffuse atrophic changes are noted.  No findings to suggest acute hemorrhage, acute infarction or space-occupying mass lesion are seen. Areas of chronic white matter ischemic change are noted stable from the prior exam. Vascular: No hyperdense vessel or unexpected calcification. Skull: The skull is intact. Multiple fractures are noted involving the nasal bones as well as the maxillary antra bilaterally. These will be better evaluated on the maxillofacial CT. Other: Air-fluid levels are noted in the maxillary antra bilaterally. CT MAXILLOFACIAL FINDINGS Osseous: Bilateral nasal bone fractures are noted with mild impaction. Fractures of the pterygoid plates are noted with mild displacement bilaterally. Fracture lines are also noted within the anterior and lateral walls of  the maxillary antra bilaterally. Involvement of the posterior walls of the maxillary antra are noted as well. No definitive orbital fracture is seen. No muscular entrapment noted. Zygomatic arches are within normal limits. Orbits: The orbits and their contents are within normal limits. Sinuses: Considerable soft tissue swelling is noted within the nasal passages. Air-fluid levels are noted within the maxillary antra bilaterally. Soft tissues: Mild soft tissue swelling is noted in the right forehead. No sizable hematoma seen. No other soft tissue abnormality is noted. IMPRESSION: CT of the maxillofacial bones: Multiple fractures involving the maxillary antra bilaterally as well as the pterygoid plates bilaterally. No definitive orbital involvement is noted. Air-fluid levels within the maxillary antra bilaterally. CT of the head: Chronic changes without acute abnormality. Electronically Signed   By: Inez Catalina M.D.   On: 03/28/2017 17:53   Review of Systems  Constitutional: Negative for chills and fever.  Eyes: Negative for visual disturbance.  Respiratory: Negative for shortness of breath.   Cardiovascular: Negative for chest pain and palpitations.  Gastrointestinal: Negative for abdominal pain, nausea and vomiting.  Musculoskeletal: Negative for back pain and neck pain.  Skin: Positive for wound.  Neurological: Negative for syncope, facial asymmetry, speech difficulty, weakness, light-headedness, numbness and headaches. No LOC. All other systems reviewed and are negative.  Blood pressure (!) 144/47, pulse 87, temperature 97.8 F (36.6 C), temperature source Oral, resp. rate 18, height 5\' 7"  (1.702 m), weight 224 lb 15.7 oz (102 kg), SpO2 95 %. Physical Exam  Constitutional: She appears well-developed and well-nourished. No distress.  Head: Head with abrasion.  Eyes: Conjunctivae are normal. EOMI. Ears: Normal auricles and EACs. Nose: Dorsal edema and abrasions. No significant deformity. No  septal hematoma. Mouth: Normal mucosa. No lesion or injury. Neck: Neck supple. No lesion or injury. Cardiovascular: Normal rate and regular rhythm.   Pulmonary/Chest: Effort normal and breath sounds normal. No respiratory distress.  Neurological: She is alert. No cranial nerve deficit or sensory deficit. Skin: Skin is warm and dry.  Psychiatric: She has a normal mood and affect.  Nursing note and vitals reviewed.  Assessment/Plan: Nasal and maxillary fractures. No significant deformity. Likely will not need any surgical intervention. Will re-evaluate once the facial edema subsides.  Devaunte Gasparini W Mykaylah Ballman 03/29/2017, 10:09 AM

## 2017-03-29 NOTE — Progress Notes (Signed)
Inserted foley catheter per protocol.

## 2017-03-29 NOTE — Anesthesia Procedure Notes (Signed)
Procedure Name: Intubation Date/Time: 03/29/2017 9:30 AM Performed by: Lavell Luster Pre-anesthesia Checklist: Patient identified, Emergency Drugs available, Suction available, Patient being monitored and Timeout performed Patient Re-evaluated:Patient Re-evaluated prior to inductionOxygen Delivery Method: Circle system utilized Preoxygenation: Pre-oxygenation with 100% oxygen Intubation Type: IV induction and Rapid sequence Laryngoscope Size: Mac, 3 and Glidescope Grade View: Grade I Tube type: Oral Number of attempts: 1 Airway Equipment and Method: Stylet and Video-laryngoscopy Placement Confirmation: ETT inserted through vocal cords under direct vision,  positive ETCO2 and breath sounds checked- equal and bilateral Secured at: 22 cm Tube secured with: Tape Dental Injury: Teeth and Oropharynx as per pre-operative assessment

## 2017-03-29 NOTE — Transfer of Care (Signed)
Immediate Anesthesia Transfer of Care Note  Patient: Christy [name]  Procedure(s) Performed: Procedure(s): OPEN REDUCTION INTERNAL FIXATION (ORIF) RADIAL FRACTURES (Bilateral)  Patient Location: PACU  Anesthesia Type:General  Level of Consciousness: awake, alert  and sedated  Airway & Oxygen Therapy: Patient connected to face mask oxygen  Post-op Assessment: Post -op Vital signs reviewed and stable  Post vital signs: stable  Last Vitals:  Vitals:   03/29/17 0408 03/29/17 1355  BP: (!) 144/47 (!) 162/47  Pulse: 87 82  Resp: 18 (!) 24  Temp: 36.6 C     Last Pain:  Vitals:   03/29/17 0408  TempSrc: Oral  PainSc:       Patients Stated Pain Goal: Other (Comment) (unable to assess) (48/54/62 7035)  Complications: No apparent anesthesia complications

## 2017-03-29 NOTE — Anesthesia Postprocedure Evaluation (Signed)
Anesthesia Post Note  Patient: Christy [name]  Procedure(s) Performed: Procedure(s) (LRB): OPEN REDUCTION INTERNAL FIXATION (ORIF) RADIAL FRACTURES (Bilateral)  Patient location during evaluation: PACU Anesthesia Type: General Level of consciousness: awake and alert Pain management: pain level controlled Vital Signs Assessment: post-procedure vital signs reviewed and stable Respiratory status: spontaneous breathing, nonlabored ventilation, respiratory function stable and patient connected to nasal cannula oxygen Cardiovascular status: blood pressure returned to baseline and stable Postop Assessment: no signs of nausea or vomiting Anesthetic complications: no       Last Vitals:  Vitals:   03/29/17 1509 03/29/17 1510  BP:  (!) 138/38  Pulse: 75 76  Resp: (!) 21 20  Temp:      Last Pain:  Vitals:   03/29/17 1516  TempSrc:   PainSc: Asleep                 Catalina Gravel

## 2017-03-29 NOTE — Anesthesia Preprocedure Evaluation (Addendum)
Anesthesia Evaluation  Patient identified by MRN, date of birth, ID band Patient awake    Reviewed: Allergy & Precautions, NPO status , Patient's Chart, lab work & pertinent test results  Airway Mallampati: IV  TM Distance: >3 FB Neck ROM: Full    Dental  (+) Dental Advisory Given, Edentulous Upper, Edentulous Lower   Pulmonary neg pulmonary ROS, former smoker,    Pulmonary exam normal breath sounds clear to auscultation       Cardiovascular hypertension, Pt. on medications Normal cardiovascular exam+ dysrhythmias (LBBB)  Rhythm:Regular Rate:Normal  Elevated troponin. Seen by Cardiology pre-operatively.  Cards Note: 1. Elevated troponin - unclear significance - she has not had any chest pain since admission - she is not SOB - EF is nl by preliminary echo report with no obvious WMA 2. Preop evaluation - pt does not have any history of ischemic or CHF sx  - she was climbing stairs on a regular basis PTA - she is at elevated but acceptable risk for surgery.   Neuro/Psych negative neurological ROS     GI/Hepatic negative GI ROS, Neg liver ROS,   Endo/Other  diabetes (insulin gtt), Poorly Controlled, Type 2, Insulin Dependent, Oral Hypoglycemic Agents  Renal/GU ARF and Renal InsufficiencyRenal disease     Musculoskeletal negative musculoskeletal ROS (+)   Abdominal   Peds  Hematology negative hematology ROS (+)   Anesthesia Other Findings Day of surgery medications reviewed with the patient.  Reproductive/Obstetrics                           Anesthesia Physical Anesthesia Plan  ASA: III and emergent  Anesthesia Plan: General   Post-op Pain Management:    Induction: Intravenous  Airway Management Planned: Oral ETT  Additional Equipment:   Intra-op Plan:   Post-operative Plan: Extubation in OR  Informed Consent: I have reviewed the patients History and Physical, chart, labs and  discussed the procedure including the risks, benefits and alternatives for the proposed anesthesia with the patient or authorized representative who has indicated his/her understanding and acceptance.   Dental advisory given  Plan Discussed with: CRNA  Anesthesia Plan Comments: (Patient's son utilized as Optometrist during pre-op interview.  All questions answered.)       Anesthesia Quick Evaluation

## 2017-03-29 NOTE — Progress Notes (Signed)
Notified MD of troponin 0.08

## 2017-03-29 NOTE — Progress Notes (Signed)
Inpatient Diabetes Program Recommendations  AACE/ADA: New Consensus Statement on Inpatient Glycemic Control (2015)  Target Ranges:  Prepandial:   less than 140 mg/dL      Peak postprandial:   less than 180 mg/dL (1-2 hours)      Critically ill patients:  140 - 180 mg/dL   Lab Results  Component Value Date   GLUCAP 328 (H) 03/29/2017   HGBA1C 9.5 01/06/2017    Review of Glycemic Control  Diabetes history: DM2 Outpatient Diabetes medications: Levemir 26 units QHS, metformin 1000 mg, Novolog 30 units bid Current orders for Inpatient glycemic control: IV insulin per GlucoStabilizer.  HgbA1C of 9.5% indicates poor glycemic control PTA. Will need home meds adjusted.  Inpatient Diabetes Program Recommendations:    Continue with IV insulin per GlucoStabilizer until criteria met for discontinuation of drip. Give Levemir 1-2 hours prior to d/cing drip. Levemir 15 units bid Novolog 4 units tidwc if po intake > 50%  Will need interpreter to speak with pt regarding her diabetes control at home.   Will follow.  Thank you. Lorenda Peck, RD, LDN, CDE Inpatient Diabetes Coordinator [phone]

## 2017-03-29 NOTE — Consult Note (Signed)
CARDIOLOGY CONSULT NOTE   Patient ID: Christy [name] MRN: [id number] DOB/AGE: May 07, 1941 76 y.o.  Admit date: 03/28/2017  Primary Physician   [name], MD Primary Cardiologist   Dr Acie Fredrickson 2012 Reason for Consultation   preop evaluation, elevated troponin Requesting MD: Dr Waldron Labs  HPI: Christy [name] is a 76 y.o. female with hx of abnl ECG w/ Q waves inf leads, nl echo 2012, breast CA, DM, HTN, HLD, osteoporosis.  Pt is being seen today for the evaluation of elevated troponin preop at the request of Dr Waldron Labs.  Pt daughter contacted by phone and acted as Optometrist.  Ms Sottile has not had any chest pain. She has no history of chest pain or SOB. She lives in a second story apartment and so climbs stairs all the time. She has not had any chest pain or DOE. No LE edema, orthopnea or PND. No palpitations. No hx presyncope or syncope.   She was walking after church when she fell. There may have been a L leg problem, but this is unclear. Pt states she "lost her steps". She has multiple facial fx, L patellar fx and R wrist fx. The R wrist needs surgery, planned for today.   She had problems overnight with N&V, that has improved her CBG was > 500.    Past Medical History:  Diagnosis Date  . Abnormal ECG    Inferior Q waves  . Cancer ([place])    stage II right breast cancer  . Cataract   . Diabetes mellitus   . Hypercholesterolemia   . Hyperlipidemia   . Hypertension   . Osteoporosis   . Overactive bladder      Past Surgical History:  Procedure Laterality Date  . BREAST SURGERY     right lumpectomy, sentinel node biopsy  . CATARACT EXTRACTION Bilateral 2008  . FRACTURE SURGERY     ORIF right distal radius fx  . JOINT REPLACEMENT     right hip  . PORT-A-CATH REMOVAL  09/11/2011   Procedure: REMOVAL PORT-A-CATH;  Surgeon: Rolm Bookbinder, MD;  Location: [facility];  Service: General;  Laterality: Left;  local   . PORTACATH PLACEMENT       No Known Allergies  I have reviewed the patient's current medications . fentaNYL (SUBLIMAZE) injection  25 mcg Intravenous Once  . insulin regular  0-10 Units Intravenous TID WC  . midazolam  1 mg Intravenous Once   . sodium chloride 75 mL/hr at 03/28/17 2333  . sodium chloride    .  ceFAZolin (ANCEF) IV    . dextrose 5 % and 0.45% NaCl    . insulin (NOVOLIN-R) infusion     dextrose, fentaNYL (SUBLIMAZE) injection, HYDROcodone-acetaminophen, ondansetron (ZOFRAN) IV, promethazine  Medication Sig  alendronate (FOSAMAX) 70 MG tablet Take 1 tablet (70 mg total) by mouth every 7 (seven) days. Take with a full glass of water on an empty stomach. Patient taking differently: Take 70 mg by mouth every Monday. Take with a full glass of water on an empty stomach.  amLODipine (NORVASC) 5 MG tablet Take 1 tablet (5 mg total) by mouth daily.  Cholecalciferol (VITAMIN D) 2000 units tablet Take 2,000 Units by mouth daily.  fenofibrate (TRICOR) 48 MG tablet Take 1 tablet (48 mg total) by mouth daily.  furosemide (LASIX) 40 MG tablet Take 1 tablet (40 mg total) by mouth daily.  ibuprofen (ADVIL,MOTRIN) 800 MG tablet Take 800 mg by mouth every 8 (eight) hours as needed for  fever or moderate pain.   insulin aspart (NOVOLOG) 100 UNIT/ML injection Inject 30 Units into the skin 2 (two) times daily with a meal.  insulin detemir (LEVEMIR) 100 UNIT/ML injection Inject 0.2 mLs (20 Units total) into the skin at bedtime. Patient taking differently: Inject 26 Units into the skin at bedtime.   lisinopril (PRINIVIL,ZESTRIL) 40 MG tablet Take 1 tablet (40 mg total) by mouth daily.  metFORMIN (GLUCOPHAGE-XR) 500 MG 24 hr tablet Take 2 tablets (1,000 mg total) by mouth 2 (two) times daily.  Omega-3 Fatty Acids (FISH OIL) 1200 MG CAPS Take 1,200 mg by mouth 2 (two) times daily.  simvastatin (ZOCOR) 40 MG tablet TAKE 1 TABLET BY MOUTH ONCE DAILY  solifenacin (VESICARE) 10 MG tablet Take 1 tablet (10 mg total) by  mouth daily.  furosemide (LASIX) 20 MG tablet Take 20 mg in the morning, in addition to your nightly dose. 20 mg twice a day for 5 days, then resume 20 daily Patient not taking: Reported on 03/28/2017  glimepiride (AMARYL) 2 MG tablet Take 1 tablet (2 mg total) by mouth daily before breakfast. Patient not taking: Reported on 03/28/2017  potassium chloride (K-DUR) 10 MEQ tablet Take 1 tablet (10 mEq total) by mouth 2 (two) times daily. Patient not taking: Reported on 08/03/2016     Social History   Social History  . Marital status: Widowed    Spouse name: N/A  . Number of children: N/A  . Years of education: N/A   Occupational History  . Retired    Social History Main Topics  . Smoking status: Former Smoker    Types: Cigarettes    Quit date: 12/27/2012  . Smokeless tobacco: Never Used     Comment: quit 2009  . Alcohol use No  . Drug use: No  . Sexual activity: No   Other Topics Concern  . Not on file   Social History Narrative   From [place], needs translator    Family Status  Relation Status  . Mother Deceased  . Father Deceased  . Sister Deceased  . Brother Deceased  . Brother Deceased   Family History  Problem Relation Age of Onset  . Stroke Mother   . Stroke Father   . Hypertension Sister   . Stroke Sister   . Stroke Brother      ROS:  Full 14 point review of systems complete and found to be negative unless listed above.  Physical Exam: Blood pressure (!) 144/47, pulse 87, temperature 97.8 F (36.6 C), temperature source Oral, resp. rate 18, height 5\' 7"  (1.702 m), weight 224 lb 15.7 oz (102 kg), SpO2 95 %.  General: Well developed, well nourished, female in no acute distress Head: Eyes PERRLA, No xanthomas.   Normocephalic and atraumatic, oropharynx without edema or exudate. Dentition: poor Lungs: clear anteriorly, few rales in bases (difficult to assess 2nd position) Heart: HRRR S1 S2, no rub/gallop, no murmur. pulses are 2+ 2/4 extrem.  RUE is  splinted and dressed, not disturbed but cap refill nl. RLE pulse is decreased but present Neck: No carotid bruits. No lymphadenopathy.  JVD not elevated Abdomen: Bowel sounds present, abdomen soft and non-tender without masses or hernias noted. Msk:  No spine or cva tenderness. No weakness, no joint deformities or effusions. Extremities: No clubbing or cyanosis. No edema.  Neuro: Alert and oriented X 3. No focal deficits noted. Skin: No rashes or lesions noted. Areas of ecchymosis noted 2nd injuries.  Labs:   Lab Results  Component  Value Date   WBC 15.7 (H) 03/29/2017   HGB 12.1 03/29/2017   HCT 37.6 03/29/2017   MCV 93.8 03/29/2017   PLT 184 03/29/2017    Recent Labs  03/28/17 2034  INR 0.99     Recent Labs Lab 03/29/17 0623  NA 142  K 4.6  CL 103  CO2 25  BUN 39*  CREATININE 1.38*  CALCIUM 9.1  GLUCOSE 521*   No results found for: MG  Recent Labs  03/28/17 2034 03/29/17 0132 03/29/17 0623  TROPONINI 0.03* 0.08* 0.21*   B Natriuretic Peptide  Date/Time Value Ref Range Status  03/28/2017 04:45 PM 14.0 0.0 - 100.0 pg/mL Final   Lab Results  Component Value Date   CHOL 193 01/06/2017   HDL 44 (L) 01/06/2017   LDLCALC 107 (H) 01/06/2017   TRIG 212 (H) 01/06/2017   TSH  Date/Time Value Ref Range Status  01/06/2017 09:13 AM 3.73 mIU/L Final    Echo: 08/28/2011 - Left ventricle: The cavity size was normal. Wall thickness was increased in a pattern of severe LVH. Systolic function was normal. The estimated ejection fraction was in the range of 55% to 60%. - Aortic valve: Unable to tell if valve is trileaflet. Appears to have restricted motion on 2D but no gradient by doppler - Left atrium: The atrium was mildly to moderately dilated. - Atrial septum: No defect or patent foramen ovale was identified.  ECG:  03/29/2017 SR, LBBB (seen in 2017 but did not have in 2012)  Cath: n/a  Radiology:  Dg Chest 1 View Result Date:  03/28/2017 CLINICAL DATA:  Legs gave out, patient fell, deformities to LEFT and RIGHT wrists, laceration to bridge of nose, LEFT knee abrasion EXAM: CHEST 1 VIEW COMPARISON:  07/02/2010 FINDINGS: Enlargement of cardiac silhouette with slight vascular congestion. Mediastinal contours normal. Interval removal of Port-A-Cath. Atherosclerotic calcification aorta. Mild bibasilar atelectasis versus infiltrate. Upper lungs clear. No pleural effusion or pneumothorax. Bones demineralized. IMPRESSION: Enlargement of cardiac silhouette with pulmonary vascular congestion. Mild bibasilar atelectasis versus infiltrate. Electronically Signed   By: Lavonia Dana M.D.   On: 03/28/2017 18:27   Dg Wrist Complete Left Result Date: 03/28/2017 CLINICAL DATA:  76 year old female with history of trauma from a fall complaining of pain in the left wrist. EXAM: LEFT WRIST - COMPLETE 3+ VIEW COMPARISON:  Left hand radiograph 06/13/2009. FINDINGS: Highly comminuted fractures of the distal radius and ulna are noted. The radial fracture is highly comminuted, intra-articular, impacted and displaced, with extension of the fracture through the metaphyseal region into the distal diaphyseal region. Individual fracture fragments are displaced in a random distribution, generally extending outward from the distal radius. There is some very mild volar angulation on the cross-table lateral view (approximately 15-20 degrees). Distal ulnar fracture is also highly comminuted, with radial displacement of the distal ulna approximately 1 to 1-1/2 shaft width. There also appears to be some distal migration of the proximal ulnar fragment into the overlying soft tissues which are markedly swollen. The carpal some cells appear intact. IMPRESSION: 1. Highly comminuted, displaced and angulated intra-articular fractures of the distal radius and ulna, as detailed above. Electronically Signed   By: Vinnie Langton M.D.   On: 03/28/2017 18:27   Dg Wrist Complete  Right Result Date: 03/28/2017 CLINICAL DATA:  Recent fall with wrist pain, initial encounter EXAM: RIGHT WRIST - COMPLETE 3+ VIEW COMPARISON:  06/11/2009 FINDINGS: Fixation sideplate is noted along the distal radius in an area of prior fracture. A new fracture  is noted in the radial metaphysis with posterior angulation. Additionally there is fracture of the fixation sideplate distally at the area of fracture. Deformity of the distal ulna is noted related to prior fracture and healing. No other are seen. IMPRESSION: Changes consistent with previous healed radial and ulnar fractures with surgical fixation. New radial metaphyseal fracture with posterior angulation and fracture of the fixation sideplate. Electronically Signed   By: Inez Catalina M.D.   On: 03/28/2017 18:26   Ct Head Wo Contrast Result Date: 03/28/2017 CLINICAL DATA:  Recent fall with facial injury and headaches, initial encounter EXAM: CT HEAD WITHOUT CONTRAST CT MAXILLOFACIAL WITHOUT CONTRAST TECHNIQUE: Multidetector CT imaging of the head and maxillofacial structures were performed using the standard protocol without intravenous contrast. Multiplanar CT image reconstructions of the maxillofacial structures were also generated. COMPARISON:  06/11/2009 FINDINGS: CT HEAD FINDINGS Brain: Diffuse atrophic changes are noted. No findings to suggest acute hemorrhage, acute infarction or space-occupying mass lesion are seen. Areas of chronic white matter ischemic change are noted stable from the prior exam. Vascular: No hyperdense vessel or unexpected calcification. Skull: The skull is intact. Multiple fractures are noted involving the nasal bones as well as the maxillary antra bilaterally. These will be better evaluated on the maxillofacial CT. Other: Air-fluid levels are noted in the maxillary antra bilaterally. CT MAXILLOFACIAL FINDINGS Osseous: Bilateral nasal bone fractures are noted with mild impaction. Fractures of the pterygoid plates are noted with  mild displacement bilaterally. Fracture lines are also noted within the anterior and lateral walls of the maxillary antra bilaterally. Involvement of the posterior walls of the maxillary antra are noted as well. No definitive orbital fracture is seen. No muscular entrapment noted. Zygomatic arches are within normal limits. Orbits: The orbits and their contents are within normal limits. Sinuses: Considerable soft tissue swelling is noted within the nasal passages. Air-fluid levels are noted within the maxillary antra bilaterally. Soft tissues: Mild soft tissue swelling is noted in the right forehead. No sizable hematoma seen. No other soft tissue abnormality is noted. IMPRESSION: CT of the maxillofacial bones: Multiple fractures involving the maxillary antra bilaterally as well as the pterygoid plates bilaterally. No definitive orbital involvement is noted. Air-fluid levels within the maxillary antra bilaterally. CT of the head: Chronic changes without acute abnormality. Electronically Signed   By: Inez Catalina M.D.   On: 03/28/2017 17:53   Dg Knee Complete 4 Views Left Result Date: 03/28/2017 CLINICAL DATA:  Recent fall with knee pain, initial encounter EXAM: LEFT KNEE - COMPLETE 4+ VIEW COMPARISON:  10/12/2016 FINDINGS: There is a transverse fracture through the midportion of the patella with mild distraction of the fracture fragments of approximately 1 cm. Overlying soft tissue swelling and small joint effusion is noted. No other fracture or dislocation is seen. IMPRESSION: Patellar fracture with small joint effusion. No other bony abnormality is seen. Electronically Signed   By: Inez Catalina M.D.   On: 03/28/2017 18:24    ASSESSMENT AND PLAN:   The patient was seen today by Dr Debara Pickett, the patient evaluated and the data reviewed.   Principal Problem:   Multiple fractures - mgt per Ortho - for R wrist surgery today    Elevated troponin - unclear significance - she has not had any chest pain since  admission - she is not SOB - EF is nl by preliminary echo report with no obvious WMA - will ck stat CKMB    Preop evaluation - pt does not have any history of ischemic  or CHF sx  - she was climbing stairs on a regular basis PTA - also, see above  - she is at elevated but acceptable risk for surgery.  Otherwise, per IM Active Problems:   Uncontrolled type II diabetes mellitus ([place])   Acute renal failure (ARF) ([place])   Leukocytosis   Fall   SignedLenoard Aden 03/29/2017 7:46 AM Beeper [phone]  Co-Sign MD

## 2017-03-29 NOTE — Op Note (Signed)
PREOPERATIVE DIAGNOSIS: #1: Comminuted right wrist intra-articular distal radius fracture of 3 or more fragments  #2: Retained distal radius plate with fracture through the plate  #3: Comminuted intra-articular distal radius fracture 3 or more fragments  #4: Osteoporosis with history of breast cancer  POSTOPERATIVE DIAGNOSIS: Same  ATTENDING SURGEON: Dr. Gavin Pound who was scrubbed and present for the entire procedure  ASSISTANT SURGEON: None  ANESTHESIA: Gen. via endotracheal anesthesia  OPERATIVE PROCEDURE: #1: Open treatment of right wrist intra-articular distal radius fracture 3 more fragments  #2: Removal of deep implant plate and screws, volar plate  #3: Right wrist brachia radialis tendon tenotomy, sliding tenotomy  #4: Radiographs 3 views AP lateral oblique views of the right wrist  #5: Radiographs 2 views AP lateral views the forearm   #6: Open treatment of left wrist intra-articluar distal radius fracture 3 or more fragments  #7: Left wrist brachia radialis tendon tenotomy, sliding tenotomy   #8: Closed treatment of left distal ulna fracture requiring manipulation, ulnar shaft  #9: Radiographs 3 views AP lateral and oblique views of the left wrist  #10: Radiographs 2 views AP lateral views of the left forearm  IMPLANTS: Standard hand innovations long volar plate with 3.5 bicortical screws in the shaft and smooth distal locking pegs distally for the right side 5 mL of Biomet stay graft bone graft substitute for the metaphyseal defect DVR cross lock long plate with 2.7 screws in the shaft locking and nonlocking screws and a combination distal locking pegs and screws. 5 mL Biomet stay graft bone graft substitute for the metaphyseal defect  RADIOGRAPHIC INTERPRETATION: AP lateral and oblique views of the right wrist and AP lateral  views of the right forearm do show the volar plate in good position there is good alignment of the radial height inclination and  tilt.  AP lateral oblique views of the left wrist and AP lateral views of the left forearm do show the volar plate with a large metaphyseal defect filled in with the bone graft substitute with the distal ulna fracture which is mildly displaced  SURGICAL INDICATIONS: The patient is a right-hand-dominant female who sustained closed injuries to both distal radius. Patient was seen and evaluated and based on the high degree of comminution and displacement it was recommended that she undergo the above procedures. Risks benefits and alternatives were discussed in detail with the patient in a signed informed consent was obtained.  SURGICAL TECHNIQUE: Patient was properly identified in the preoperative holding area and a mark with a permanent marker made on the right and left wrist. Patient was then brought back to the operating room and placed supine on anesthesia room table where general anesthesia was administered. Patient received preoperative antibiotics prior to any skin incision. A well-padded tourniquet was then placed on the right brachium and sealed with a 1000 drape. The right upper extremity was then prepped and draped in the normal sterile fashion. A timeout was called the correct site was identified and the procedure was then begun. Attention was then turned to the right wrist. The previous incision was lengthened proximally. The tourniquet was then insufflated. Dissection carried down through the skin and subcutaneous tissue. The FPL was then swept out of the way there was very little the pronator quadratus left. The plate was then identified as well as the screws proximally and distally. There was bone overgrowth on the plate therefore small osteotomes were then used to remove the plate and screw construct. There was a fracture through  the plate. After the deep implant was then removed attention was then turned open reduction and internal fixation. The patient did have the intra-articular fracture 3  more fracture fracture fragments. An open reduction was then performed. The patient had a large metaphyseal defect. With the metaphyseal defect noted. In order to reduce the radial column the brachial radialis was then carefully released and a sliding tendon tenotomy was then done releasing the radial styloid protecting the first dorsal compartment tendons. The bone graft substitute was packed into this defect. The long hand innovations DVR plate was then applied. It was held in place with K wires and its position then confirmed using the mini C-arm. After plate position was then confirmed in reduction was satisfactory screw fixation was then carried out distally with a combination oh distal locking pegs and screws. Shaft fixation was then carried out proximally spanning the large metaphyseal defect with 3.5 bicortical screws. The wound was then thoroughly irrigated after final radiographs of the obtained stress radiography confirmed good alignment without any instability. The subcutaneous tissues were then closed with Vicryl and the skin closed with 3-0 Prolene sutures. An Adaptic dressing and a sterile compressive bandage was then applied. The patient was then placed in a well-padded sugar tong splint.  Attention was then turned to the contralateral side. Well-padded tourniquet was then placed on the brachia left brachium and sealed with a 1000 drape. The left upper extremity was then prepped and draped in the normal sterile fashion. A timeout was called the correct site was identified and the procedure was then begun. Attention turned to the left wrist for a longitudinal incision made directly over the FCR sheath. Dissection carried down through the skin and subcutaneous knees tissues. Deep dissection carried down through the floor the FCR. The FPL was then carefully swept out of the way and there was really little pronator quadratus remaining. The patient had a very high degree of metaphyseal comminution.  Several large metaphyseal fragments without any soft tissue attachments were then removed. The metaphyseal fragments the didn't have soft tissue were then carefully reduced and held with in place with reduction clamps and K wire. A 20-gauge cerclage wire was then placed around the bone to hold the metaphysis in good anatomical position of the volar cortex. This was tightened down for temporary fixation. Once this was carried out the long DVR cross lock plate was then applied and held proximally and distally with K wires. Plate position confirmed good alignment. Again the patient had a large metaphyseal defect in this was then packed then with 5 mL of the bone graft substitute. An open reduction was then performed of the intra-articular fracture 3 or more fragments. The brachia radialis was then carefully elevated in a sliding tenotomy was then carried out to release the radial styloid in order in order to obtain reduction. The wound was irrigated the plated then applied and held and then screw fixation was then carried out from an ulnar to radial direction with a combination of locking screws and multidirectional screws for the radial styloid. Attention was then turned up proximally where a combination of locking and nonlocking screws were placed in the shaft. Final fixation was then achieved. The cerclage wire was removed. The wound was irrigated. Final radiographs and then obtained. The subcutaneous tissues were then closed with Vicryl and the skin closed with simple 3-0 Prolene suture. Adaptic dressing and a sterile compressive bandage was then applied. The close manipulation had been performed of the distal ulna  and the ulna was felt to be in satisfactory position was still some mild displacement. Did not feel was indicated to pursue further fixation of the distal ulna shaft and head fracture.  Final stress radiography was carried out. An Adaptic dressing and a sterile compressive bandage was then applied. The  patient was then placed in a well-padded padded sugar tong splint and extubated and taken recovery room in good condition.  Of note there was concern that the left side was an open fracture. The patient did have a small skin tear volarly but this did not appear to be communicating with the fracture site. The patient had some soft tissue abrasions and ecchymosis but did not appear to be an open fracture the distal radius or ulna.  POSTOPERATIVE PLAN: Patient is to be admitted back to the internal medicine service. She'll be nonweightbearing in bilateral upper extremities. I will need to see her back in the office in approximately 2 weeks for repeat radiographs at total 4 weeks long arm immobilization given the high degree of the fracture fragments in the metaphyseal comminution. At this 4 week mark we'll take her out of the long-arm cast and likely place her in a short arm cast for a total of 2 weeks and then begin an outpatient therapy regimen at the 2 week. Strict ice and elevation while she is in the hospital.

## 2017-03-29 NOTE — Progress Notes (Signed)
PROGRESS NOTE                                                                                                                                                                                                             Patient Demographics:    Christy [name], is a 76 y.o. female, DOB - 1941/02/05, MEB:[id number]  Admit date - 03/28/2017   Admitting Physician Christy Morton, MD  Outpatient Primary MD for the patient is Christy Skiff, MD  LOS - 1  Chief Complaint  Patient presents with  . Fall  . Arm Injury  . Facial Laceration       Brief Narrative   76 y.o. Bosnian only speaking female with medical history significant of HTN, DM type II, osteoporosis, breast cancer; who present after having a fall, unfortunately she sustained nasal fracture, bilateral distal radius fracture, and patellar fracture.   Subjective:    Christy [name] today Complaining of pain, son at bedside help to translate, as well she had some nausea, reports some vomiting overnight as well .    Assessment  & Plan :    Principal Problem:   Multiple fractures Active Problems:   Uncontrolled type II diabetes mellitus (HCC)   Acute renal failure (ARF) (HCC)   Leukocytosis   Fall   Nasal and maxillary fractures.  - Management per ENT ,No significant deformity. Likely will not need any surgical intervention. ENT Will re-evaluate once the facial edema subsides.  bilateral distal radius fracture - for Surgical repair by Dr Christy [name] today.  Patella fracture - We'll discuss with orthopedics.  Elevated troponin - Most likely stress related, unusual input appreciated, continue with aspirin, 2-D echo with no regional wall motion abnormalities.  Diabetes mellitus - Significantly elevated CBG this a.m., but not in  DKA, given patient will need to go to OR, I have started her on IV insulin glucose stabilizer, to achieve good control in  appropriate time for surgery, when she  comes out of surgery  she be transitioned to long-term acting insulin, with insulin sliding scale. 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  Consults  :  Hand Surgery, Ortho, ENT, CArdiology  Procedures  : none yet  DVT Prophylaxis  :   SCDs   Lab Results  Component Value Date   PLT 184 03/29/2017    Antibiotics  :    Anti-infectives    Start     Dose/Rate Route Frequency Ordered Stop   03/29/17 0600  ceFAZolin (ANCEF) IVPB 2g/100 mL premix     2 g 200 mL/hr over 30 Minutes Intravenous On call to O.R. 03/28/17 2211 03/30/17 0559   03/28/17 1600  ceFAZolin (ANCEF) IVPB 2g/100 mL premix     2 g 200 mL/hr over 30 Minutes Intravenous  Once 03/28/17 1554 03/28/17 1922        Objective:   Vitals:   03/28/17 1930 03/28/17 2130 03/28/17 2213 03/29/17 0408  BP: (!) 165/74 (!) 110/54 (!) 148/58 (!) 144/47  Pulse: 96 95 98 87  Resp: 20  18 18   Temp:   98.7 F (37.1 C) 97.8 F (36.6 C)  TempSrc:   Oral Oral  SpO2: 95% 94%  95%  Weight:   102 kg (224 lb 15.7 oz)   Height:        Wt Readings from Last 3 Encounters:  03/28/17 102 kg (224 lb 15.7 oz)  01/22/17 98.4 kg (217 lb)  12/31/16 99.7 kg (219 lb 12.8 oz)     Intake/Output Summary (Last 24 hours) at 03/29/17 1319 Last data filed at 03/29/17 1315  Gross per 24 hour  Intake             1000 ml  Output             1950 ml  Net             -950 ml     Physical Exam  Awake Alert, In mild discomfort secondary to pain Supple Neck,No  JVD, broken nose, with tried blood, no nose deviation Symmetrical Chest wall movement, Good air movement bilaterally, CTAB RRR,No Gallops,Rubs or new Murmurs, No Parasternal Heave +ve B.Sounds, Abd Soft, No tenderness,  No rebound - guarding or rigidity. No Cyanosis, bilateral arms in splints, and left extremity in knee immobilizer    Data Review:    CBC  Recent Labs Lab 03/28/17 1645 03/29/17 0623  WBC 13.5* 15.7*  HGB 13.5 12.1  HCT 40.7 37.6  PLT 192 184  MCV 92.5 93.8  MCH 30.7 30.2  MCHC 33.2 32.2  RDW 13.6 13.5  LYMPHSABS 1.1  --   MONOABS 0.8  --   EOSABS 0.0  --   BASOSABS 0.0  --     Chemistries   Recent Labs Lab 03/28/17 1645 03/29/17 0623  NA 135 142  K 5.1 4.6  CL 98* 103  CO2 23 25  GLUCOSE 546* 521*  BUN 44* 39*  CREATININE 1.70* 1.38*  CALCIUM 9.7 9.1   ------------------------------------------------------------------------------------------------------------------ No results for input(s): CHOL, HDL, LDLCALC, TRIG, CHOLHDL, LDLDIRECT in the last 72 hours.  Lab Results  Component Value Date   HGBA1C 9.5 01/06/2017   ------------------------------------------------------------------------------------------------------------------ No results for input(s): TSH, T4TOTAL, T3FREE, THYROIDAB in the last 72 hours.  Invalid input(s): FREET3 ------------------------------------------------------------------------------------------------------------------ No results for input(s): VITAMINB12, FOLATE, FERRITIN, TIBC, IRON, RETICCTPCT in the  last 72 hours.  Coagulation profile  Recent Labs Lab 03/28/17 2034  INR 0.99    No results for input(s): DDIMER in the last 72 hours.  Cardiac Enzymes  Recent Labs Lab 03/28/17 2034 03/29/17 0132 03/29/17 0623 03/29/17 0822  CKMB  --   --   --  6.5*  TROPONINI 0.03* 0.08* 0.21*  --    ------------------------------------------------------------------------------------------------------------------     Component Value Date/Time   BNP 14.0 03/28/2017 1645    Inpatient Medications  Scheduled Meds: . [MAR Hold] aspirin  325 mg Oral Daily  . [MAR Hold] insulin regular  0-10 Units Intravenous TID WC  . [MAR Hold] midazolam  1 mg Intravenous Once   Continuous Infusions: . sodium chloride 75 mL/hr at 03/28/17 2333  . sodium chloride    .  ceFAZolin (ANCEF) IV    . dextrose 5 % and 0.45% NaCl    . insulin (NOVOLIN-R) infusion     PRN Meds:.0.9 % irrigation (POUR BTL), [MAR Hold] dextrose, [MAR Hold] fentaNYL (SUBLIMAZE) injection, [MAR Hold] HYDROcodone-acetaminophen, [MAR Hold] ondansetron (ZOFRAN) IV, [MAR Hold] promethazine  Micro Results Recent Results (from the past 240 hour(s))  Surgical PCR screen     Status: None   Collection Time: 03/28/17 11:54 PM  Result Value Ref Range Status   MRSA, PCR NEGATIVE NEGATIVE Final   Staphylococcus aureus NEGATIVE NEGATIVE Final    Comment:        The Xpert SA Assay (FDA approved for NASAL specimens in patients over 23 years of age), is one component of a comprehensive surveillance program.  Test performance has been validated by [facility] for patients greater than or equal to 30 year old. It is not intended to diagnose infection nor to guide or monitor treatment.     Radiology Reports Dg Chest 1 View  Result Date: 03/28/2017 CLINICAL DATA:  Legs gave out, patient fell, deformities to LEFT and RIGHT wrists, laceration to bridge of nose, LEFT knee abrasion EXAM: CHEST 1 VIEW COMPARISON:  07/02/2010 FINDINGS: Enlargement of cardiac silhouette with slight vascular congestion. Mediastinal contours normal. Interval removal of Port-A-Cath. Atherosclerotic calcification aorta. Mild bibasilar atelectasis versus infiltrate. Upper lungs clear. No pleural effusion or pneumothorax. Bones demineralized. IMPRESSION: Enlargement of cardiac silhouette with pulmonary vascular congestion. Mild bibasilar atelectasis versus infiltrate. Electronically  Signed   By: Lavonia Dana M.D.   On: 03/28/2017 18:27   Dg Wrist Complete Left  Result Date: 03/28/2017 CLINICAL DATA:  76 year old female with history of trauma from a fall complaining of pain in the left wrist. EXAM: LEFT WRIST - COMPLETE 3+ VIEW COMPARISON:  Left hand radiograph 06/13/2009. FINDINGS: Highly comminuted fractures of the distal radius and ulna are noted. The radial fracture is highly comminuted, intra-articular, impacted and displaced, with extension of the fracture through the metaphyseal region into the distal diaphyseal region. Individual fracture fragments are displaced in a random distribution, generally extending outward from the distal radius. There is some very mild volar angulation on the cross-table lateral view (approximately 15-20 degrees). Distal ulnar fracture is also highly comminuted, with radial displacement of the distal ulna approximately 1 to 1-1/2 shaft width. There also appears to be some distal migration of the proximal ulnar fragment into the overlying soft tissues which are markedly swollen. The carpal some cells appear intact. IMPRESSION: 1. Highly comminuted, displaced and angulated intra-articular fractures of the distal radius and ulna, as detailed above. Electronically Signed   By: Vinnie Langton M.D.   On: 03/28/2017 18:27   Dg  Wrist Complete Right  Result Date: 03/28/2017 CLINICAL DATA:  Recent fall with wrist pain, initial encounter EXAM: RIGHT WRIST - COMPLETE 3+ VIEW COMPARISON:  06/11/2009 FINDINGS: Fixation sideplate is noted along the distal radius in an area of prior fracture. A new fracture is noted in the radial metaphysis with posterior angulation. Additionally there is fracture of the fixation sideplate distally at the area of fracture. Deformity of the distal ulna is noted related to prior fracture and healing. No other are seen. IMPRESSION: Changes consistent with previous healed radial and ulnar fractures with surgical fixation. New radial  metaphyseal fracture with posterior angulation and fracture of the fixation sideplate. Electronically Signed   By: Inez Catalina M.D.   On: 03/28/2017 18:26   Ct Head Wo Contrast  Result Date: 03/28/2017 CLINICAL DATA:  Recent fall with facial injury and headaches, initial encounter EXAM: CT HEAD WITHOUT CONTRAST CT MAXILLOFACIAL WITHOUT CONTRAST TECHNIQUE: Multidetector CT imaging of the head and maxillofacial structures were performed using the standard protocol without intravenous contrast. Multiplanar CT image reconstructions of the maxillofacial structures were also generated. COMPARISON:  06/11/2009 FINDINGS: CT HEAD FINDINGS Brain: Diffuse atrophic changes are noted. No findings to suggest acute hemorrhage, acute infarction or space-occupying mass lesion are seen. Areas of chronic white matter ischemic change are noted stable from the prior exam. Vascular: No hyperdense vessel or unexpected calcification. Skull: The skull is intact. Multiple fractures are noted involving the nasal bones as well as the maxillary antra bilaterally. These will be better evaluated on the maxillofacial CT. Other: Air-fluid levels are noted in the maxillary antra bilaterally. CT MAXILLOFACIAL FINDINGS Osseous: Bilateral nasal bone fractures are noted with mild impaction. Fractures of the pterygoid plates are noted with mild displacement bilaterally. Fracture lines are also noted within the anterior and lateral walls of the maxillary antra bilaterally. Involvement of the posterior walls of the maxillary antra are noted as well. No definitive orbital fracture is seen. No muscular entrapment noted. Zygomatic arches are within normal limits. Orbits: The orbits and their contents are within normal limits. Sinuses: Considerable soft tissue swelling is noted within the nasal passages. Air-fluid levels are noted within the maxillary antra bilaterally. Soft tissues: Mild soft tissue swelling is noted in the right forehead. No sizable  hematoma seen. No other soft tissue abnormality is noted. IMPRESSION: CT of the maxillofacial bones: Multiple fractures involving the maxillary antra bilaterally as well as the pterygoid plates bilaterally. No definitive orbital involvement is noted. Air-fluid levels within the maxillary antra bilaterally. CT of the head: Chronic changes without acute abnormality. Electronically Signed   By: Inez Catalina M.D.   On: 03/28/2017 17:53   Dg Knee Complete 4 Views Left  Result Date: 03/28/2017 CLINICAL DATA:  Recent fall with knee pain, initial encounter EXAM: LEFT KNEE - COMPLETE 4+ VIEW COMPARISON:  10/12/2016 FINDINGS: There is a transverse fracture through the midportion of the patella with mild distraction of the fracture fragments of approximately 1 cm. Overlying soft tissue swelling and small joint effusion is noted. No other fracture or dislocation is seen. IMPRESSION: Patellar fracture with small joint effusion. No other bony abnormality is seen. Electronically Signed   By: Inez Catalina M.D.   On: 03/28/2017 18:24   Ct Maxillofacial Wo Cm  Result Date: 03/28/2017 CLINICAL DATA:  Recent fall with facial injury and headaches, initial encounter EXAM: CT HEAD WITHOUT CONTRAST CT MAXILLOFACIAL WITHOUT CONTRAST TECHNIQUE: Multidetector CT imaging of the head and maxillofacial structures were performed using the standard protocol without  intravenous contrast. Multiplanar CT image reconstructions of the maxillofacial structures were also generated. COMPARISON:  06/11/2009 FINDINGS: CT HEAD FINDINGS Brain: Diffuse atrophic changes are noted. No findings to suggest acute hemorrhage, acute infarction or space-occupying mass lesion are seen. Areas of chronic white matter ischemic change are noted stable from the prior exam. Vascular: No hyperdense vessel or unexpected calcification. Skull: The skull is intact. Multiple fractures are noted involving the nasal bones as well as the maxillary antra bilaterally. These  will be better evaluated on the maxillofacial CT. Other: Air-fluid levels are noted in the maxillary antra bilaterally. CT MAXILLOFACIAL FINDINGS Osseous: Bilateral nasal bone fractures are noted with mild impaction. Fractures of the pterygoid plates are noted with mild displacement bilaterally. Fracture lines are also noted within the anterior and lateral walls of the maxillary antra bilaterally. Involvement of the posterior walls of the maxillary antra are noted as well. No definitive orbital fracture is seen. No muscular entrapment noted. Zygomatic arches are within normal limits. Orbits: The orbits and their contents are within normal limits. Sinuses: Considerable soft tissue swelling is noted within the nasal passages. Air-fluid levels are noted within the maxillary antra bilaterally. Soft tissues: Mild soft tissue swelling is noted in the right forehead. No sizable hematoma seen. No other soft tissue abnormality is noted. IMPRESSION: CT of the maxillofacial bones: Multiple fractures involving the maxillary antra bilaterally as well as the pterygoid plates bilaterally. No definitive orbital involvement is noted. Air-fluid levels within the maxillary antra bilaterally. CT of the head: Chronic changes without acute abnormality. Electronically Signed   By: Inez Catalina M.D.   On: 03/28/2017 17:53     Leiani Enright M.D on 03/29/2017 at 1:19 PM  Between 7am to 7pm - Pager - [phone]  After 7pm go to [email or web address] - password [facility]  [facility] -  Office  [phone]

## 2017-03-29 NOTE — Progress Notes (Signed)
Notified MD of blood glucose of 485, orders received for 15 units of sliding scale insulin

## 2017-03-29 NOTE — Progress Notes (Signed)
CBG was taken for 8pm dose. Though docked, the results had not come through. RN asked NT for results- she stated 244. RN placed this in gluco-stabilizer and adjusted insulin rate to 7.4. When CBG results finally read correctly- level was 287 instead of 244.  CBG reading for 9pm dose is 269. Patient is stable and insulin rate will be adjusted to 10.5.   Will be in the room when NT takes CBG for glucose-stabilizer.

## 2017-03-29 NOTE — Progress Notes (Signed)
D/c orders were put in for pt on glucose-stabilizer. Levemir and Novolog were added as patient tapered off gtt. However, gtt was d/c'ed at that time. This RN asked the [facility] and rapid about insulin order being d/c'ed too early. Rapid stated that DKA patients are to stay on gtt another 2 hours after receiving Levemir and then give sliding scale when patient is officially off insulin gtt. RN paged NP about concern- was told that pt was not officially in DKA so gtt can be stopped and SSI can be started. Will continue to monitor

## 2017-03-29 NOTE — Progress Notes (Signed)
Room air resting sat 84%, placed on 4L nasal cannula an continuous pulse ox, sats increased to 94%

## 2017-03-29 NOTE — Progress Notes (Signed)
Notified MD of troponin 0.21,  And blood glucose of 521

## 2017-03-30 DIAGNOSIS — S022XXA Fracture of nasal bones, initial encounter for closed fracture: Secondary | ICD-10-CM

## 2017-03-30 DIAGNOSIS — R778 Other specified abnormalities of plasma proteins: Secondary | ICD-10-CM

## 2017-03-30 DIAGNOSIS — R7989 Other specified abnormal findings of blood chemistry: Secondary | ICD-10-CM

## 2017-03-30 DIAGNOSIS — S02401A Maxillary fracture, unspecified, initial encounter for closed fracture: Secondary | ICD-10-CM

## 2017-03-30 LAB — BASIC METABOLIC PANEL
Anion gap: 9 (ref 5–15)
BUN: 22 mg/dL — ABNORMAL HIGH (ref 6–20)
CHLORIDE: 102 mmol/L (ref 101–111)
CO2: 27 mmol/L (ref 22–32)
CREATININE: 0.88 mg/dL (ref 0.44–1.00)
Calcium: 8.2 mg/dL — ABNORMAL LOW (ref 8.9–10.3)
Glucose, Bld: 226 mg/dL — ABNORMAL HIGH (ref 65–99)
POTASSIUM: 3.8 mmol/L (ref 3.5–5.1)
SODIUM: 138 mmol/L (ref 135–145)

## 2017-03-30 LAB — GLUCOSE, CAPILLARY
GLUCOSE-CAPILLARY: 208 mg/dL — AB (ref 65–99)
GLUCOSE-CAPILLARY: 207 mg/dL — AB (ref 65–99)
Glucose-Capillary: 203 mg/dL — ABNORMAL HIGH (ref 65–99)
Glucose-Capillary: 270 mg/dL — ABNORMAL HIGH (ref 65–99)
Glucose-Capillary: 320 mg/dL — ABNORMAL HIGH (ref 65–99)

## 2017-03-30 MED ORDER — HYDRALAZINE HCL 20 MG/ML IJ SOLN
10.0000 mg | INTRAMUSCULAR | Status: DC | PRN
  Administered 2017-03-31: 10 mg via INTRAVENOUS

## 2017-03-30 MED ORDER — INSULIN DETEMIR 100 UNIT/ML ~~LOC~~ SOLN
35.0000 [IU] | Freq: Every day | SUBCUTANEOUS | Status: DC
  Administered 2017-03-30 – 2017-04-01 (×3): 35 [IU] via SUBCUTANEOUS
  Filled 2017-03-30 (×3): qty 0.35

## 2017-03-30 MED ORDER — AMLODIPINE BESYLATE 10 MG PO TABS
10.0000 mg | ORAL_TABLET | Freq: Every day | ORAL | Status: DC
  Administered 2017-03-31 – 2017-04-05 (×5): 10 mg via ORAL
  Filled 2017-03-30 (×5): qty 1

## 2017-03-30 MED ORDER — DM-GUAIFENESIN ER 30-600 MG PO TB12
2.0000 | ORAL_TABLET | Freq: Two times a day (BID) | ORAL | Status: DC
  Administered 2017-03-30 – 2017-04-05 (×12): 2 via ORAL
  Filled 2017-03-30 (×13): qty 2

## 2017-03-30 MED ORDER — INSULIN DETEMIR 100 UNIT/ML ~~LOC~~ SOLN: 30.0000 [IU] | Freq: Every day | SUBCUTANEOUS | Status: DC

## 2017-03-30 MED ORDER — SIMVASTATIN 40 MG PO TABS
40.0000 mg | ORAL_TABLET | Freq: Every day | ORAL | Status: DC
  Administered 2017-03-30: 40 mg via ORAL
  Filled 2017-03-30: qty 1

## 2017-03-30 MED ORDER — AMLODIPINE BESYLATE 5 MG PO TABS
5.0000 mg | ORAL_TABLET | Freq: Once | ORAL | Status: AC
  Administered 2017-03-30: 5 mg via ORAL
  Filled 2017-03-30: qty 1

## 2017-03-30 MED ORDER — AMLODIPINE BESYLATE 5 MG PO TABS
5.0000 mg | ORAL_TABLET | Freq: Every day | ORAL | Status: DC
  Administered 2017-03-30: 5 mg via ORAL
  Filled 2017-03-30: qty 1

## 2017-03-30 MED ORDER — INSULIN ASPART 100 UNIT/ML ~~LOC~~ SOLN
5.0000 [IU] | Freq: Three times a day (TID) | SUBCUTANEOUS | Status: DC
  Administered 2017-03-30 – 2017-04-04 (×12): 5 [IU] via SUBCUTANEOUS

## 2017-03-30 MED ORDER — INSULIN DETEMIR 100 UNIT/ML ~~LOC~~ SOLN
15.0000 [IU] | SUBCUTANEOUS | Status: AC
  Administered 2017-03-30: 15 [IU] via SUBCUTANEOUS
  Filled 2017-03-30: qty 0.15

## 2017-03-30 MED ORDER — FENOFIBRATE 54 MG PO TABS
54.0000 mg | ORAL_TABLET | Freq: Every day | ORAL | Status: DC
  Administered 2017-03-30 – 2017-04-05 (×6): 54 mg via ORAL
  Filled 2017-03-30 (×7): qty 1

## 2017-03-30 MED ORDER — HEPARIN SODIUM (PORCINE) 5000 UNIT/ML IJ SOLN
5000.0000 [IU] | Freq: Three times a day (TID) | INTRAMUSCULAR | Status: DC
  Administered 2017-03-30 – 2017-04-05 (×18): 5000 [IU] via SUBCUTANEOUS
  Filled 2017-03-30 (×18): qty 1

## 2017-03-30 MED ORDER — VITAMIN D 1000 UNITS PO TABS
2000.0000 [IU] | ORAL_TABLET | Freq: Every day | ORAL | Status: DC
  Administered 2017-03-30 – 2017-04-05 (×6): 2000 [IU] via ORAL
  Filled 2017-03-30 (×7): qty 2

## 2017-03-30 MED ORDER — FENTANYL CITRATE (PF) 100 MCG/2ML IJ SOLN: 25.0000 ug | INTRAMUSCULAR | Status: DC | PRN

## 2017-03-30 MED ORDER — ATORVASTATIN CALCIUM 20 MG PO TABS
20.0000 mg | ORAL_TABLET | Freq: Every day | ORAL | Status: DC
  Administered 2017-04-01 – 2017-04-05 (×5): 20 mg via ORAL
  Filled 2017-03-30 (×5): qty 1

## 2017-03-30 NOTE — Progress Notes (Signed)
PROGRESS NOTE                                                                                                                                                                                                             Patient Demographics:    Christy [name], is a 76 y.o. female, DOB - [birth year], HRC:[id number]  Admit date - 03/28/2017   Admitting Physician Norval Morton, MD  Outpatient Primary MD for the patient is [name], MD  LOS - 2  Chief Complaint  Patient presents with  . Fall  . Arm Injury  . Facial Laceration       Brief Narrative   76 y.o. Bosnian only speaking female with medical history significant of HTN, DM type II, osteoporosis, breast cancer; who present after having a fall, unfortunately she sustained nasal fracture, bilateral distal radius fracture, and patellar fracture.   Subjective:    Christy [name] today Complaining of pain, No nausea, no vomiting, she is afebrile, no significant events overnight.    Assessment  & Plan :    Principal Problem:   Multiple fractures Active Problems:   Uncontrolled type II diabetes mellitus (HCC)   Acute renal failure (ARF) (HCC)   Leukocytosis   Fall   bilateral distal radius fracture - Status postsurgical repair by Dr. Apolonio Schneiders 5/28 - Continue with when necessary fentanyl for pain,  - PT/OT to evaluate once stable.  Nasal and maxillary fractures.  - Management per ENT ,No significant deformity. Likely will not need any surgical intervention, to follow with ENT as an outpatient in one week .  Patella fracture - Therapeutic consult greatly appreciated, plan for surgical repair tomorrow  Elevated troponin - Surgery input greatly appreciated ,Most likely stress related, continue with aspirin, 2-D echo with no regional wall motion abnormalities.  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  Hypertension - Continue to hold lisinopril, poorly controlled, will increase the amlodipine dose to 10 mg oral  daily, will start on when necessary hydralazine  Hyperlipidemia  - resume Zocor and fenofibrate when patient is stable  Osteoporosis  - resume Fosamax and vitamin D once stable  Code Status : Full  Family Communication  : none at bedside  Disposition Plan  : will need PT evaluation.  Consults  :  Hand Surgery, Ortho, ENT, Cardiology  Procedures  : Bilateral wrist repair by Dr. Apolonio Schneiders 5/28  DVT Prophylaxis  :   SCDs , [place] heparin  Lab Results  Component Value Date   PLT 184 03/29/2017    Antibiotics  :    Anti-infectives    Start     Dose/Rate Route Frequency Ordered Stop   03/29/17 2200  ceFAZolin (ANCEF) IVPB 1 g/50 mL premix     1 g 100 mL/hr over 30 Minutes Intravenous Every 8 hours 03/29/17 1619 03/30/17 2159   03/29/17 1430  ceFAZolin (ANCEF) IVPB 1 g/50 mL premix     1 g 100 mL/hr over 30 Minutes Intravenous NOW 03/29/17 1416 03/29/17 1453   03/29/17 0600  ceFAZolin (ANCEF) IVPB 2g/100 mL premix  Status:  Discontinued     2 g 200 mL/hr over 30 Minutes Intravenous On call to O.R. 03/28/17 2211 03/29/17 1616   03/28/17 1600  ceFAZolin (ANCEF) IVPB 2g/100 mL premix     2 g 200 mL/hr over 30 Minutes Intravenous  Once 03/28/17 1554 03/28/17 1922        Objective:   Vitals:   03/30/17 0600 03/30/17 0620 03/30/17 0841 03/30/17 1129  BP: (!) 179/58 (!) 193/68 (!) 181/45 (!) 150/46  Pulse: 89     Resp:      Temp:      TempSrc:      SpO2:      Weight:      Height:        Wt Readings from Last 3 Encounters:    03/28/17 102 kg (224 lb 15.7 oz)  01/22/17 98.4 kg (217 lb)  12/31/16 99.7 kg (219 lb 12.8 oz)     Intake/Output Summary (Last 24 hours) at 03/30/17 1312 Last data filed at 03/30/17 1000  Gross per 24 hour  Intake             1240 ml  Output             1050 ml  Net              190 ml     Physical Exam  Awake, alert, in mild discomfort  Supple neck, no JVD, facial edema,. Good air entry bilaterally, no use of accessory muscle, clear to auscultation  Awake Alert, In mild discomfort secondary to pain Abdomen soft, nontender, nondistended, bowel sounds present  EXT with bilateral upper extremity splints/injury, left lower extremity in immobilizer , good capillary refills bilaterally   Data Review:    CBC  Recent Labs Lab 03/28/17 1645 03/29/17 0623  WBC 13.5* 15.7*  HGB 13.5 12.1  HCT 40.7 37.6  PLT 192 184  MCV 92.5 93.8  MCH 30.7 30.2  MCHC 33.2 32.2  RDW 13.6 13.5  LYMPHSABS 1.1  --   MONOABS 0.8  --   EOSABS 0.0  --   BASOSABS 0.0  --     Chemistries   Recent Labs Lab 03/28/17 1645 03/29/17 0623 03/29/17 2012  NA 135 142 141  K 5.1 4.6 5.3*  CL 98* 103 108  CO2 23 25 22   GLUCOSE 546* 521* 293*  BUN 44*  39* 38*  CREATININE 1.70* 1.38* 1.17*  CALCIUM 9.7 9.1 8.7*   ------------------------------------------------------------------------------------------------------------------ No results for input(s): CHOL, HDL, LDLCALC, TRIG, CHOLHDL, LDLDIRECT in the last 72 hours.  Lab Results  Component Value Date   HGBA1C 9.5 01/06/2017   ------------------------------------------------------------------------------------------------------------------ No results for input(s): TSH, T4TOTAL, T3FREE, THYROIDAB in the last 72 hours.  Invalid input(s): FREET3 ------------------------------------------------------------------------------------------------------------------ No results for input(s): VITAMINB12, FOLATE, FERRITIN, TIBC, IRON, RETICCTPCT in the  last 72 hours.  Coagulation profile  Recent Labs Lab 03/28/17 2034  INR 0.99    No results for input(s): DDIMER in the last 72 hours.  Cardiac Enzymes  Recent Labs Lab 03/29/17 0132 03/29/17 0623 03/29/17 0822 03/29/17 1528  CKMB  --   --  6.5*  --   TROPONINI 0.08* 0.21*  --  0.17*   ------------------------------------------------------------------------------------------------------------------    Component Value Date/Time   BNP 14.0 03/28/2017 1645    Inpatient Medications  Scheduled Meds: . amLODipine  5 mg Oral Daily  . aspirin  325 mg Oral Daily  . cholecalciferol  2,000 Units Oral Daily  . fenofibrate  54 mg Oral Daily  . insulin aspart  0-20 Units Subcutaneous TID WC  . insulin aspart  0-5 Units Subcutaneous QHS  . insulin aspart  5 Units Subcutaneous TID WC  . insulin detemir  35 Units Subcutaneous QHS  . midazolam  1 mg Intravenous Once  . simvastatin  40 mg Oral Daily  . vitamin C  1,000 mg Oral Daily   Continuous Infusions: . sodium chloride 75 mL/hr at 03/28/17 2333  .  ceFAZolin (ANCEF) IV Stopped (03/30/17 0545)   PRN Meds:.dextrose, fentaNYL (SUBLIMAZE) injection, hydrALAZINE, HYDROcodone-acetaminophen, ondansetron (ZOFRAN) IV, promethazine  Micro Results Recent Results (from the past 240 hour(s))  Surgical PCR screen     Status: None   Collection Time: 03/28/17 11:54 PM  Result Value Ref Range Status   MRSA, PCR NEGATIVE NEGATIVE Final   Staphylococcus aureus NEGATIVE NEGATIVE Final    Comment:        The Xpert SA Assay (FDA approved for NASAL specimens in patients over 50 years of age), is one component of a comprehensive surveillance program.  Test performance has been validated by [facility] for patients greater than or equal to 34 year old. It is not intended to diagnose infection nor to guide or monitor treatment.     Radiology Reports Dg Chest 1 View  Result Date: 03/28/2017 CLINICAL DATA:  Legs gave out, patient  fell, deformities to LEFT and RIGHT wrists, laceration to bridge of nose, LEFT knee abrasion EXAM: CHEST 1 VIEW COMPARISON:  07/02/2010 FINDINGS: Enlargement of cardiac silhouette with slight vascular congestion. Mediastinal contours normal. Interval removal of Port-A-Cath. Atherosclerotic calcification aorta. Mild bibasilar atelectasis versus infiltrate. Upper lungs clear. No pleural effusion or pneumothorax. Bones demineralized. IMPRESSION: Enlargement of cardiac silhouette with pulmonary vascular congestion. Mild bibasilar atelectasis versus infiltrate. Electronically Signed   By: Lavonia Dana M.D.   On: 03/28/2017 18:27   Dg Wrist Complete Left  Result Date: 03/28/2017 CLINICAL DATA:  76 year old female with history of trauma from a fall complaining of pain in the left wrist. EXAM: LEFT WRIST - COMPLETE 3+ VIEW COMPARISON:  Left hand radiograph 06/13/2009. FINDINGS: Highly comminuted fractures of the distal radius and ulna are noted. The radial fracture is highly comminuted, intra-articular, impacted and displaced, with extension of the fracture through the metaphyseal region into the distal diaphyseal region. Individual fracture fragments are displaced in a random distribution, generally extending outward  from the distal radius. There is some very mild volar angulation on the cross-table lateral view (approximately 15-20 degrees). Distal ulnar fracture is also highly comminuted, with radial displacement of the distal ulna approximately 1 to 1-1/2 shaft width. There also appears to be some distal migration of the proximal ulnar fragment into the overlying soft tissues which are markedly swollen. The carpal some cells appear intact. IMPRESSION: 1. Highly comminuted, displaced and angulated intra-articular fractures of the distal radius and ulna, as detailed above. Electronically Signed   By: Vinnie Langton M.D.   On: 03/28/2017 18:27   Dg Wrist Complete Right  Result Date: 03/28/2017 CLINICAL DATA:   Recent fall with wrist pain, initial encounter EXAM: RIGHT WRIST - COMPLETE 3+ VIEW COMPARISON:  06/11/2009 FINDINGS: Fixation sideplate is noted along the distal radius in an area of prior fracture. A new fracture is noted in the radial metaphysis with posterior angulation. Additionally there is fracture of the fixation sideplate distally at the area of fracture. Deformity of the distal ulna is noted related to prior fracture and healing. No other are seen. IMPRESSION: Changes consistent with previous healed radial and ulnar fractures with surgical fixation. New radial metaphyseal fracture with posterior angulation and fracture of the fixation sideplate. Electronically Signed   By: Inez Catalina M.D.   On: 03/28/2017 18:26   Ct Head Wo Contrast  Result Date: 03/28/2017 CLINICAL DATA:  Recent fall with facial injury and headaches, initial encounter EXAM: CT HEAD WITHOUT CONTRAST CT MAXILLOFACIAL WITHOUT CONTRAST TECHNIQUE: Multidetector CT imaging of the head and maxillofacial structures were performed using the standard protocol without intravenous contrast. Multiplanar CT image reconstructions of the maxillofacial structures were also generated. COMPARISON:  06/11/2009 FINDINGS: CT HEAD FINDINGS Brain: Diffuse atrophic changes are noted. No findings to suggest acute hemorrhage, acute infarction or space-occupying mass lesion are seen. Areas of chronic white matter ischemic change are noted stable from the prior exam. Vascular: No hyperdense vessel or unexpected calcification. Skull: The skull is intact. Multiple fractures are noted involving the nasal bones as well as the maxillary antra bilaterally. These will be better evaluated on the maxillofacial CT. Other: Air-fluid levels are noted in the maxillary antra bilaterally. CT MAXILLOFACIAL FINDINGS Osseous: Bilateral nasal bone fractures are noted with mild impaction. Fractures of the pterygoid plates are noted with mild displacement bilaterally. Fracture  lines are also noted within the anterior and lateral walls of the maxillary antra bilaterally. Involvement of the posterior walls of the maxillary antra are noted as well. No definitive orbital fracture is seen. No muscular entrapment noted. Zygomatic arches are within normal limits. Orbits: The orbits and their contents are within normal limits. Sinuses: Considerable soft tissue swelling is noted within the nasal passages. Air-fluid levels are noted within the maxillary antra bilaterally. Soft tissues: Mild soft tissue swelling is noted in the right forehead. No sizable hematoma seen. No other soft tissue abnormality is noted. IMPRESSION: CT of the maxillofacial bones: Multiple fractures involving the maxillary antra bilaterally as well as the pterygoid plates bilaterally. No definitive orbital involvement is noted. Air-fluid levels within the maxillary antra bilaterally. CT of the head: Chronic changes without acute abnormality. Electronically Signed   By: Inez Catalina M.D.   On: 03/28/2017 17:53   Dg Knee Complete 4 Views Left  Result Date: 03/28/2017 CLINICAL DATA:  Recent fall with knee pain, initial encounter EXAM: LEFT KNEE - COMPLETE 4+ VIEW COMPARISON:  10/12/2016 FINDINGS: There is a transverse fracture through the midportion of the patella with  mild distraction of the fracture fragments of approximately 1 cm. Overlying soft tissue swelling and small joint effusion is noted. No other fracture or dislocation is seen. IMPRESSION: Patellar fracture with small joint effusion. No other bony abnormality is seen. Electronically Signed   By: Inez Catalina M.D.   On: 03/28/2017 18:24   Ct Maxillofacial Wo Cm  Result Date: 03/28/2017 CLINICAL DATA:  Recent fall with facial injury and headaches, initial encounter EXAM: CT HEAD WITHOUT CONTRAST CT MAXILLOFACIAL WITHOUT CONTRAST TECHNIQUE: Multidetector CT imaging of the head and maxillofacial structures were performed using the standard protocol without  intravenous contrast. Multiplanar CT image reconstructions of the maxillofacial structures were also generated. COMPARISON:  06/11/2009 FINDINGS: CT HEAD FINDINGS Brain: Diffuse atrophic changes are noted. No findings to suggest acute hemorrhage, acute infarction or space-occupying mass lesion are seen. Areas of chronic white matter ischemic change are noted stable from the prior exam. Vascular: No hyperdense vessel or unexpected calcification. Skull: The skull is intact. Multiple fractures are noted involving the nasal bones as well as the maxillary antra bilaterally. These will be better evaluated on the maxillofacial CT. Other: Air-fluid levels are noted in the maxillary antra bilaterally. CT MAXILLOFACIAL FINDINGS Osseous: Bilateral nasal bone fractures are noted with mild impaction. Fractures of the pterygoid plates are noted with mild displacement bilaterally. Fracture lines are also noted within the anterior and lateral walls of the maxillary antra bilaterally. Involvement of the posterior walls of the maxillary antra are noted as well. No definitive orbital fracture is seen. No muscular entrapment noted. Zygomatic arches are within normal limits. Orbits: The orbits and their contents are within normal limits. Sinuses: Considerable soft tissue swelling is noted within the nasal passages. Air-fluid levels are noted within the maxillary antra bilaterally. Soft tissues: Mild soft tissue swelling is noted in the right forehead. No sizable hematoma seen. No other soft tissue abnormality is noted. IMPRESSION: CT of the maxillofacial bones: Multiple fractures involving the maxillary antra bilaterally as well as the pterygoid plates bilaterally. No definitive orbital involvement is noted. Air-fluid levels within the maxillary antra bilaterally. CT of the head: Chronic changes without acute abnormality. Electronically Signed   By: Inez Catalina M.D.   On: 03/28/2017 17:53     ELGERGAWY, DAWOOD M.D on 03/30/2017 at  1:12 PM  Between 7am to 7pm - Pager - [phone]  After 7pm go to [email or web address] - password [facility]  [facility] -  Office  [phone]

## 2017-03-30 NOTE — Progress Notes (Signed)
Pre-op foley d/c'ed

## 2017-03-30 NOTE — Progress Notes (Signed)
Glucose-stabilizer completed.

## 2017-03-30 NOTE — Progress Notes (Signed)
PT Cancellation Note  Patient Details Name: Christy [name] MRN: [id number] DOB: [birth year]   Cancelled Treatment:    Reason Eval/Treat Not Completed: Medical issues which prohibited therapy  Per Dr. Erlinda Hong: Patient has a displaced left patella fracture that needs operative treatment which is scheduled for tomorrow. PT will await orders after surgery for LE WB status and mobility orders prior to mobilizing pt. Spoke with OT who is planning to evaluate pt today in bed.    Melvern Banker 03/30/2017, 10:14 AM   Lavonia Dana, PT  [phone] 03/30/2017

## 2017-03-30 NOTE — Progress Notes (Signed)
Progress Note  Patient Name: Christy [name] Date of Encounter: 03/30/2017  Primary Cardiologist: Dr. Acie Fredrickson 2012  Subjective   Patient had bilateral wrist ORIF done yesterday. She is going to patella repair surgery with Dr. Erlinda Hong today. No family members are present to help interpret. Phone interpretor required however patient quickly falls back asleep. VSS  Echo done yesterday. EF 60-65%  Inpatient Medications    Scheduled Meds: . amLODipine  5 mg Oral Daily  . aspirin  325 mg Oral Daily  . cholecalciferol  2,000 Units Oral Daily  . fenofibrate  54 mg Oral Daily  . insulin aspart  0-20 Units Subcutaneous TID WC  . insulin aspart  0-5 Units Subcutaneous QHS  . insulin aspart  5 Units Subcutaneous TID WC  . insulin detemir  30 Units Subcutaneous QHS  . midazolam  1 mg Intravenous Once  . simvastatin  40 mg Oral Daily  . vitamin C  1,000 mg Oral Daily   Continuous Infusions: . sodium chloride 75 mL/hr at 03/28/17 2333  .  ceFAZolin (ANCEF) IV Stopped (03/30/17 0545)   PRN Meds: dextrose, fentaNYL (SUBLIMAZE) injection, hydrALAZINE, HYDROcodone-acetaminophen, ondansetron (ZOFRAN) IV, promethazine   Vital Signs    Vitals:   03/30/17 0600 03/30/17 0620 03/30/17 0841 03/30/17 1129  BP: (!) 179/58 (!) 193/68 (!) 181/45 (!) 150/46  Pulse: 89     Resp:      Temp:      TempSrc:      SpO2:      Weight:      Height:        Intake/Output Summary (Last 24 hours) at 03/30/17 1136 Last data filed at 03/30/17 0515  Gross per 24 hour  Intake             1000 ml  Output             1100 ml  Net             -100 ml   Filed Weights   03/28/17 1512 03/28/17 2213  Weight: 225 lb (102.1 kg) 224 lb 15.7 oz (102 kg)    Telemetry    03/29/2017 SR, LBBB (seen in 2017 but did not have in 2012)  Physical Exam   GEN: Well nourished, well developed, drowsy HEENT: facial edema Neck: no JVD, carotid bruits, or masses Cardiac: RRR. no murmurs, rubs, or gallops,no edema. Intact  distal pulses bilaterally.  Respiratory: clear to auscultation bilaterally, normal work of breathing on [place] GI: soft, nontender, nondistended, + BS MS: injury to knee and bilateral wrist. Neuro: Alert and Oriented x 3, Strength and sensation are intact Psych:   Full affect  Labs    Chemistry Recent Labs Lab 03/28/17 1645 03/29/17 0623 03/29/17 2012  NA 135 142 141  K 5.1 4.6 5.3*  CL 98* 103 108  CO2 23 25 22   GLUCOSE 546* 521* 293*  BUN 44* 39* 38*  CREATININE 1.70* 1.38* 1.17*  CALCIUM 9.7 9.1 8.7*  GFRNONAA 28* 36* 44*  GFRAA 33* 42* 51*  ANIONGAP 14 14 11      Hematology Recent Labs Lab 03/28/17 1645 03/29/17 0623  WBC 13.5* 15.7*  RBC 4.40 4.01  HGB 13.5 12.1  HCT 40.7 37.6  MCV 92.5 93.8  MCH 30.7 30.2  MCHC 33.2 32.2  RDW 13.6 13.5  PLT 192 184    Cardiac Enzymes Recent Labs Lab 03/28/17 2034 03/29/17 0132 03/29/17 0623 03/29/17 1528  TROPONINI 0.03* 0.08* 0.21* 0.17*   No  results for input(s): TROPIPOC in the last 168 hours.   BNP Recent Labs Lab 03/28/17 1645  BNP 14.0     DDimer No results for input(s): DDIMER in the last 168 hours.   Radiology    Dg Chest 1 View  Result Date: 03/28/2017 CLINICAL DATA:  Legs gave out, patient fell, deformities to LEFT and RIGHT wrists, laceration to bridge of nose, LEFT knee abrasion EXAM: CHEST 1 VIEW COMPARISON:  07/02/2010 FINDINGS: Enlargement of cardiac silhouette with slight vascular congestion. Mediastinal contours normal. Interval removal of Port-A-Cath. Atherosclerotic calcification aorta. Mild bibasilar atelectasis versus infiltrate. Upper lungs clear. No pleural effusion or pneumothorax. Bones demineralized. IMPRESSION: Enlargement of cardiac silhouette with pulmonary vascular congestion. Mild bibasilar atelectasis versus infiltrate. Electronically Signed   By: Lavonia Dana M.D.   On: 03/28/2017 18:27   Dg Wrist Complete Left  Result Date: 03/28/2017 CLINICAL DATA:  76 year old female with  history of trauma from a fall complaining of pain in the left wrist. EXAM: LEFT WRIST - COMPLETE 3+ VIEW COMPARISON:  Left hand radiograph 06/13/2009. FINDINGS: Highly comminuted fractures of the distal radius and ulna are noted. The radial fracture is highly comminuted, intra-articular, impacted and displaced, with extension of the fracture through the metaphyseal region into the distal diaphyseal region. Individual fracture fragments are displaced in a random distribution, generally extending outward from the distal radius. There is some very mild volar angulation on the cross-table lateral view (approximately 15-20 degrees). Distal ulnar fracture is also highly comminuted, with radial displacement of the distal ulna approximately 1 to 1-1/2 shaft width. There also appears to be some distal migration of the proximal ulnar fragment into the overlying soft tissues which are markedly swollen. The carpal some cells appear intact. IMPRESSION: 1. Highly comminuted, displaced and angulated intra-articular fractures of the distal radius and ulna, as detailed above. Electronically Signed   By: Vinnie Langton M.D.   On: 03/28/2017 18:27   Dg Wrist Complete Right  Result Date: 03/28/2017 CLINICAL DATA:  Recent fall with wrist pain, initial encounter EXAM: RIGHT WRIST - COMPLETE 3+ VIEW COMPARISON:  06/11/2009 FINDINGS: Fixation sideplate is noted along the distal radius in an area of prior fracture. A new fracture is noted in the radial metaphysis with posterior angulation. Additionally there is fracture of the fixation sideplate distally at the area of fracture. Deformity of the distal ulna is noted related to prior fracture and healing. No other are seen. IMPRESSION: Changes consistent with previous healed radial and ulnar fractures with surgical fixation. New radial metaphyseal fracture with posterior angulation and fracture of the fixation sideplate. Electronically Signed   By: Inez Catalina M.D.   On: 03/28/2017  18:26   Ct Head Wo Contrast  Result Date: 03/28/2017 CLINICAL DATA:  Recent fall with facial injury and headaches, initial encounter EXAM: CT HEAD WITHOUT CONTRAST CT MAXILLOFACIAL WITHOUT CONTRAST TECHNIQUE: Multidetector CT imaging of the head and maxillofacial structures were performed using the standard protocol without intravenous contrast. Multiplanar CT image reconstructions of the maxillofacial structures were also generated. COMPARISON:  06/11/2009 FINDINGS: CT HEAD FINDINGS Brain: Diffuse atrophic changes are noted. No findings to suggest acute hemorrhage, acute infarction or space-occupying mass lesion are seen. Areas of chronic white matter ischemic change are noted stable from the prior exam. Vascular: No hyperdense vessel or unexpected calcification. Skull: The skull is intact. Multiple fractures are noted involving the nasal bones as well as the maxillary antra bilaterally. These will be better evaluated on the maxillofacial CT. Other: Air-fluid levels  are noted in the maxillary antra bilaterally. CT MAXILLOFACIAL FINDINGS Osseous: Bilateral nasal bone fractures are noted with mild impaction. Fractures of the pterygoid plates are noted with mild displacement bilaterally. Fracture lines are also noted within the anterior and lateral walls of the maxillary antra bilaterally. Involvement of the posterior walls of the maxillary antra are noted as well. No definitive orbital fracture is seen. No muscular entrapment noted. Zygomatic arches are within normal limits. Orbits: The orbits and their contents are within normal limits. Sinuses: Considerable soft tissue swelling is noted within the nasal passages. Air-fluid levels are noted within the maxillary antra bilaterally. Soft tissues: Mild soft tissue swelling is noted in the right forehead. No sizable hematoma seen. No other soft tissue abnormality is noted. IMPRESSION: CT of the maxillofacial bones: Multiple fractures involving the maxillary antra  bilaterally as well as the pterygoid plates bilaterally. No definitive orbital involvement is noted. Air-fluid levels within the maxillary antra bilaterally. CT of the head: Chronic changes without acute abnormality. Electronically Signed   By: Inez Catalina M.D.   On: 03/28/2017 17:53   Dg Knee Complete 4 Views Left  Result Date: 03/28/2017 CLINICAL DATA:  Recent fall with knee pain, initial encounter EXAM: LEFT KNEE - COMPLETE 4+ VIEW COMPARISON:  10/12/2016 FINDINGS: There is a transverse fracture through the midportion of the patella with mild distraction of the fracture fragments of approximately 1 cm. Overlying soft tissue swelling and small joint effusion is noted. No other fracture or dislocation is seen. IMPRESSION: Patellar fracture with small joint effusion. No other bony abnormality is seen. Electronically Signed   By: Inez Catalina M.D.   On: 03/28/2017 18:24   Ct Maxillofacial Wo Cm  Result Date: 03/28/2017 CLINICAL DATA:  Recent fall with facial injury and headaches, initial encounter EXAM: CT HEAD WITHOUT CONTRAST CT MAXILLOFACIAL WITHOUT CONTRAST TECHNIQUE: Multidetector CT imaging of the head and maxillofacial structures were performed using the standard protocol without intravenous contrast. Multiplanar CT image reconstructions of the maxillofacial structures were also generated. COMPARISON:  06/11/2009 FINDINGS: CT HEAD FINDINGS Brain: Diffuse atrophic changes are noted. No findings to suggest acute hemorrhage, acute infarction or space-occupying mass lesion are seen. Areas of chronic white matter ischemic change are noted stable from the prior exam. Vascular: No hyperdense vessel or unexpected calcification. Skull: The skull is intact. Multiple fractures are noted involving the nasal bones as well as the maxillary antra bilaterally. These will be better evaluated on the maxillofacial CT. Other: Air-fluid levels are noted in the maxillary antra bilaterally. CT MAXILLOFACIAL FINDINGS  Osseous: Bilateral nasal bone fractures are noted with mild impaction. Fractures of the pterygoid plates are noted with mild displacement bilaterally. Fracture lines are also noted within the anterior and lateral walls of the maxillary antra bilaterally. Involvement of the posterior walls of the maxillary antra are noted as well. No definitive orbital fracture is seen. No muscular entrapment noted. Zygomatic arches are within normal limits. Orbits: The orbits and their contents are within normal limits. Sinuses: Considerable soft tissue swelling is noted within the nasal passages. Air-fluid levels are noted within the maxillary antra bilaterally. Soft tissues: Mild soft tissue swelling is noted in the right forehead. No sizable hematoma seen. No other soft tissue abnormality is noted. IMPRESSION: CT of the maxillofacial bones: Multiple fractures involving the maxillary antra bilaterally as well as the pterygoid plates bilaterally. No definitive orbital involvement is noted. Air-fluid levels within the maxillary antra bilaterally. CT of the head: Chronic changes without acute abnormality. Electronically Signed  By: Inez Catalina M.D.   On: 03/28/2017 17:53    Cardiac Studies   Transthoracic Echocardiography 03/29/2017  ------------------------------------------------------------------- Study Conclusions  - Left ventricle: Wall thickness was increased in a pattern of   moderate LVH. Systolic function was normal. The estimated   ejection fraction was in the range of 60% to 65%. Wall motion was   normal; there were no regional wall motion abnormalities. Doppler   parameters are consistent with abnormal left ventricular   relaxation (grade 1 diastolic dysfunction). - Aortic valve: Moderately calcified annulus. Mildly calcified   leaflets. There was mild stenosis. Valve area (VTI): 1.25 cm^2.   Valve area (Vmax): 1.24 cm^2. Valve area (Vmean): 1.16 cm^2.   Patient Profile     76 y.o. female  sustained a mechanical fall and has multiple facial fractures, bilateral wrist fractures.and patellar fracture.  Troponin ordered for unknown reasons which elevated at peak 0.21 therefore cardiology consulted.  Assessment & Plan    Net loss since Admission: 1.9 l  Today's Weight: 225 lb       Admission Weight:  224 lb  Blood Pressure: 193/68 -- 150/46            Hr: 80-90s  Labs:  Creatinine 1.17,  K 5.3 , Na  141 , WBC n/a, Hbg n/a   Principal Problem:   Multiple fractures - mgt per Ortho - bilateral wrist surgery yesterday - patellar surgery today.    Elevated troponin - unclear significance ( 0.03 << 0.21 >> 0.17) it has decreased post op. - EF is nl on most recent echo - will ck stat CKMB, CKMB is at 6.5   Patient did well during the first surgery. Dr. Debara Pickett recommends cautions about perioperative B-blocker due to conduction delay. Post Op Troponin x 1 decreased from previous troponin. The recommendation is for myoview stress test after she has completed recovered from surgery and would be an anticoagulation candidate.   Kristopher Glee, PA-C  03/30/2017, 11:36 AM     Attending Note:    see my note from same day    Ramond Dial., MD, [facility] 03/30/2017, 6:17 PM [address]  [place] Pager [phone]

## 2017-03-30 NOTE — Consult Note (Signed)
ORTHOPAEDIC CONSULTATION  REQUESTING PHYSICIAN: Elgergawy, Silver Huguenin, MD  Chief Complaint: left patella fracture  HPI: Christy [name] is a 76 y.o. female who presents with left patella fracture and bilateral wrist fracture status post mechanical fall from coming out of church. She had operative fixation of both of her wrist by Dr. Irean Hong yesterday. She was medically optimized for surgery. She requires orthopedic attention for her left patella fracture. Today's encounter was performed through the use of interpreter.  Past Medical History:  Diagnosis Date  . Abnormal ECG    Inferior Q waves  . Cancer ([place])    stage II right breast cancer  . Cataract   . Diabetes mellitus   . Hypercholesterolemia   . Hyperlipidemia   . Hypertension   . Osteoporosis   . Overactive bladder    Past Surgical History:  Procedure Laterality Date  . BREAST SURGERY     right lumpectomy, sentinel node biopsy  . CATARACT EXTRACTION Bilateral 2008  . FRACTURE SURGERY     ORIF right distal radius fx  . JOINT REPLACEMENT     right hip  . PORT-A-CATH REMOVAL  09/11/2011   Procedure: REMOVAL PORT-A-CATH;  Surgeon: Rolm Bookbinder, MD;  Location: [facility];  Service: General;  Laterality: Left;  local   . PORTACATH PLACEMENT     Social History   Social History  . Marital status: Widowed    Spouse name: N/A  . Number of children: N/A  . Years of education: N/A   Occupational History  . Retired    Social History Main Topics  . Smoking status: Former Smoker    Types: Cigarettes    Quit date: 12/27/2012  . Smokeless tobacco: Never Used     Comment: quit 2009  . Alcohol use No  . Drug use: No  . Sexual activity: No   Other Topics Concern  . 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 She is [place] but can understand [name] and [place]. She has a son and daughter who help in her care.   Family History  Problem Relation Age of Onset  . Stroke  Mother   . Stroke Father   . Hypertension Sister   . Stroke Sister   . Stroke Brother    - negative except otherwise stated in the family history section No Known Allergies Prior to Admission medications   Medication Sig Start Date End Date Taking? Authorizing Provider  alendronate (FOSAMAX) 70 MG tablet Take 1 tablet (70 mg total) by mouth every 7 (seven) days. Take with a full glass of water on an empty stomach. Patient taking differently: Take 70 mg by mouth every Monday. Take with a full glass of water on an empty stomach. 12/31/16  Yes [name], [name], MD  amLODipine (NORVASC) 5 MG tablet Take 1 tablet (5 mg total) by mouth daily. 12/31/16  Yes [name], [name], MD  Cholecalciferol (VITAMIN D) 2000 units tablet Take 2,000 Units by mouth daily.   Yes [provider]  fenofibrate (TRICOR) 48 MG tablet Take 1 tablet (48 mg total) by mouth daily. 12/31/16  Yes [name], [name], MD  furosemide (LASIX) 40 MG tablet Take 1 tablet (40 mg total) by mouth daily. 12/31/16  Yes [name], [name], MD  ibuprofen (ADVIL,MOTRIN) 800 MG tablet Take 800 mg by mouth every 8 (eight) hours as needed for fever or moderate pain.  02/28/13  Yes [provider]  insulin aspart (NOVOLOG) 100 UNIT/ML injection Inject 30 Units  into the skin 2 (two) times daily with a meal.   Yes [provider]  insulin detemir (LEVEMIR) 100 UNIT/ML injection Inject 0.2 mLs (20 Units total) into the skin at bedtime. Patient taking differently: Inject 26 Units into the skin at bedtime.  12/31/16  Yes [name], [name], MD  lisinopril (PRINIVIL,ZESTRIL) 40 MG tablet Take 1 tablet (40 mg total) by mouth daily. 12/31/16  Yes [name], [name], MD  metFORMIN (GLUCOPHAGE-XR) 500 MG 24 hr tablet Take 2 tablets (1,000 mg total) by mouth 2 (two) times daily. 01/13/17  Yes [name], Earna Coder, MD  Omega-3 Fatty Acids (FISH OIL) 1200 MG CAPS Take 1,200 mg by mouth 2 (two) times daily.   Yes [provider]  simvastatin  (ZOCOR) 40 MG tablet TAKE 1 TABLET BY MOUTH ONCE DAILY 02/02/17  Yes [name], [name], MD  solifenacin (VESICARE) 10 MG tablet Take 1 tablet (10 mg total) by mouth daily. 12/31/16  Yes [name], [name], MD  furosemide (LASIX) 20 MG tablet Take 20 mg in the morning, in addition to your nightly dose. 20 mg twice a day for 5 days, then resume 20 daily Patient not taking: Reported on 03/28/2017 03/21/16   Tanna Furry, MD  glimepiride (AMARYL) 2 MG tablet Take 1 tablet (2 mg total) by mouth daily before breakfast. Patient not taking: Reported on 03/28/2017 12/31/16   [name], MD  potassium chloride (K-DUR) 10 MEQ tablet Take 1 tablet (10 mEq total) by mouth 2 (two) times daily. Patient not taking: Reported on 08/03/2016 03/21/16   Tanna Furry, MD   Dg Chest 1 View  Result Date: 03/28/2017 CLINICAL DATA:  Legs gave out, patient fell, deformities to LEFT and RIGHT wrists, laceration to bridge of nose, LEFT knee abrasion EXAM: CHEST 1 VIEW COMPARISON:  07/02/2010 FINDINGS: Enlargement of cardiac silhouette with slight vascular congestion. Mediastinal contours normal. Interval removal of Port-A-Cath. Atherosclerotic calcification aorta. Mild bibasilar atelectasis versus infiltrate. Upper lungs clear. No pleural effusion or pneumothorax. Bones demineralized. IMPRESSION: Enlargement of cardiac silhouette with pulmonary vascular congestion. Mild bibasilar atelectasis versus infiltrate. Electronically Signed   By: Lavonia Dana M.D.   On: 03/28/2017 18:27   Dg Wrist Complete Left  Result Date: 03/28/2017 CLINICAL DATA:  76 year old female with history of trauma from a fall complaining of pain in the left wrist. EXAM: LEFT WRIST - COMPLETE 3+ VIEW COMPARISON:  Left hand radiograph 06/13/2009. FINDINGS: Highly comminuted fractures of the distal radius and ulna are noted. The radial fracture is highly comminuted, intra-articular, impacted and displaced, with extension of the fracture through the metaphyseal region  into the distal diaphyseal region. Individual fracture fragments are displaced in a random distribution, generally extending outward from the distal radius. There is some very mild volar angulation on the cross-table lateral view (approximately 15-20 degrees). Distal ulnar fracture is also highly comminuted, with radial displacement of the distal ulna approximately 1 to 1-1/2 shaft width. There also appears to be some distal migration of the proximal ulnar fragment into the overlying soft tissues which are markedly swollen. The carpal some cells appear intact. IMPRESSION: 1. Highly comminuted, displaced and angulated intra-articular fractures of the distal radius and ulna, as detailed above. Electronically Signed   By: Vinnie Langton M.D.   On: 03/28/2017 18:27   Dg Wrist Complete Right  Result Date: 03/28/2017 CLINICAL DATA:  Recent fall with wrist pain, initial encounter EXAM: RIGHT WRIST - COMPLETE 3+ VIEW COMPARISON:  06/11/2009 FINDINGS: Fixation sideplate is noted along the distal radius in an area of  prior fracture. A new fracture is noted in the radial metaphysis with posterior angulation. Additionally there is fracture of the fixation sideplate distally at the area of fracture. Deformity of the distal ulna is noted related to prior fracture and healing. No other are seen. IMPRESSION: Changes consistent with previous healed radial and ulnar fractures with surgical fixation. New radial metaphyseal fracture with posterior angulation and fracture of the fixation sideplate. Electronically Signed   By: Inez Catalina M.D.   On: 03/28/2017 18:26   Ct Head Wo Contrast  Result Date: 03/28/2017 CLINICAL DATA:  Recent fall with facial injury and headaches, initial encounter EXAM: CT HEAD WITHOUT CONTRAST CT MAXILLOFACIAL WITHOUT CONTRAST TECHNIQUE: Multidetector CT imaging of the head and maxillofacial structures were performed using the standard protocol without intravenous contrast. Multiplanar CT image  reconstructions of the maxillofacial structures were also generated. COMPARISON:  06/11/2009 FINDINGS: CT HEAD FINDINGS Brain: Diffuse atrophic changes are noted. No findings to suggest acute hemorrhage, acute infarction or space-occupying mass lesion are seen. Areas of chronic white matter ischemic change are noted stable from the prior exam. Vascular: No hyperdense vessel or unexpected calcification. Skull: The skull is intact. Multiple fractures are noted involving the nasal bones as well as the maxillary antra bilaterally. These will be better evaluated on the maxillofacial CT. Other: Air-fluid levels are noted in the maxillary antra bilaterally. CT MAXILLOFACIAL FINDINGS Osseous: Bilateral nasal bone fractures are noted with mild impaction. Fractures of the pterygoid plates are noted with mild displacement bilaterally. Fracture lines are also noted within the anterior and lateral walls of the maxillary antra bilaterally. Involvement of the posterior walls of the maxillary antra are noted as well. No definitive orbital fracture is seen. No muscular entrapment noted. Zygomatic arches are within normal limits. Orbits: The orbits and their contents are within normal limits. Sinuses: Considerable soft tissue swelling is noted within the nasal passages. Air-fluid levels are noted within the maxillary antra bilaterally. Soft tissues: Mild soft tissue swelling is noted in the right forehead. No sizable hematoma seen. No other soft tissue abnormality is noted. IMPRESSION: CT of the maxillofacial bones: Multiple fractures involving the maxillary antra bilaterally as well as the pterygoid plates bilaterally. No definitive orbital involvement is noted. Air-fluid levels within the maxillary antra bilaterally. CT of the head: Chronic changes without acute abnormality. Electronically Signed   By: Inez Catalina M.D.   On: 03/28/2017 17:53   Dg Knee Complete 4 Views Left  Result Date: 03/28/2017 CLINICAL DATA:  Recent fall  with knee pain, initial encounter EXAM: LEFT KNEE - COMPLETE 4+ VIEW COMPARISON:  10/12/2016 FINDINGS: There is a transverse fracture through the midportion of the patella with mild distraction of the fracture fragments of approximately 1 cm. Overlying soft tissue swelling and small joint effusion is noted. No other fracture or dislocation is seen. IMPRESSION: Patellar fracture with small joint effusion. No other bony abnormality is seen. Electronically Signed   By: Inez Catalina M.D.   On: 03/28/2017 18:24   Ct Maxillofacial Wo Cm  Result Date: 03/28/2017 CLINICAL DATA:  Recent fall with facial injury and headaches, initial encounter EXAM: CT HEAD WITHOUT CONTRAST CT MAXILLOFACIAL WITHOUT CONTRAST TECHNIQUE: Multidetector CT imaging of the head and maxillofacial structures were performed using the standard protocol without intravenous contrast. Multiplanar CT image reconstructions of the maxillofacial structures were also generated. COMPARISON:  06/11/2009 FINDINGS: CT HEAD FINDINGS Brain: Diffuse atrophic changes are noted. No findings to suggest acute hemorrhage, acute infarction or space-occupying mass lesion are seen.  Areas of chronic white matter ischemic change are noted stable from the prior exam. Vascular: No hyperdense vessel or unexpected calcification. Skull: The skull is intact. Multiple fractures are noted involving the nasal bones as well as the maxillary antra bilaterally. These will be better evaluated on the maxillofacial CT. Other: Air-fluid levels are noted in the maxillary antra bilaterally. CT MAXILLOFACIAL FINDINGS Osseous: Bilateral nasal bone fractures are noted with mild impaction. Fractures of the pterygoid plates are noted with mild displacement bilaterally. Fracture lines are also noted within the anterior and lateral walls of the maxillary antra bilaterally. Involvement of the posterior walls of the maxillary antra are noted as well. No definitive orbital fracture is seen. No  muscular entrapment noted. Zygomatic arches are within normal limits. Orbits: The orbits and their contents are within normal limits. Sinuses: Considerable soft tissue swelling is noted within the nasal passages. Air-fluid levels are noted within the maxillary antra bilaterally. Soft tissues: Mild soft tissue swelling is noted in the right forehead. No sizable hematoma seen. No other soft tissue abnormality is noted. IMPRESSION: CT of the maxillofacial bones: Multiple fractures involving the maxillary antra bilaterally as well as the pterygoid plates bilaterally. No definitive orbital involvement is noted. Air-fluid levels within the maxillary antra bilaterally. CT of the head: Chronic changes without acute abnormality. Electronically Signed   By: Inez Catalina M.D.   On: 03/28/2017 17:53   - pertinent xrays, CT, MRI studies were reviewed and independently interpreted  Positive ROS: All other systems have been reviewed and were otherwise negative with the exception of those mentioned in the HPI and as above.  Physical Exam: General: Alert, no acute distress Cardiovascular: No pedal edema Respiratory: No cyanosis, no use of accessory musculature GI: No organomegaly, abdomen is soft and non-tender Skin: No lesions in the area of chief complaint Neurologic: Sensation intact distally Psychiatric: Patient is competent for consent with normal mood and affect Lymphatic: No axillary or cervical lymphadenopathy  MUSCULOSKELETAL:  Left knee exam shows moderate swelling with pain with knee extension. She is neurovascularly intact distally.  Assessment: Left patella fracture  Plan: Patient has a displaced left patella fracture that needs operative treatment. We will attempt ORIF but will likely be amenable to partial patellectomy only. The risks benefits alternatives to surgery were discussed with the patient with the use of interpreter. She understands and wishes to proceed. We'll plan on surgery  tomorrow.  Thank you for the consult and the opportunity to see Ms. Mayor  N. Eduard Roux, MD [place] 8:15 AM

## 2017-03-30 NOTE — Progress Notes (Signed)
RN has asked pt about pain (using pain card her family left) but pt falls asleep by the time RN comes back in room. She has been given water multiple times but states nothing about pain. Will continue to monitor

## 2017-03-30 NOTE — Progress Notes (Signed)
Subjective: Resting comfortably in bed.  Objective: Vital signs in last 24 hours: Temp:  [97.1 F (36.2 C)-98.6 F (37 C)] 98.6 F (37 C) (05/29 0500) Pulse Rate:  [67-89] 89 (05/29 0600) Resp:  [14-24] 20 (05/29 0500) BP: (120-193)/(33-68) 193/68 (05/29 0620) SpO2:  [93 %-100 %] 93 % (05/29 0500)  Physical Exam Constitutional: She appears well-developedand well-nourished. No distress.  Head: Head with abrasion.  Eyes: Conjunctivaeare normal. EOMI. Ears: Normal auricles and EACs. Nose: Dorsal edema and abrasions. No significant deformity. No septal hematoma. Mouth: Normal mucosa. No lesion or injury. Neck: Neck supple. No lesion or injury. Cardiovascular: Normal rateand regular rhythm.  Pulmonary/Chest: Effort normaland breath sounds normal. No respiratory distress.  Nursing noteand vitalsreviewed.   Recent Labs  03/28/17 1645 03/29/17 0623  WBC 13.5* 15.7*  HGB 13.5 12.1  HCT 40.7 37.6  PLT 192 184    Recent Labs  03/29/17 0623 03/29/17 2012  NA 142 141  K 4.6 5.3*  CL 103 108  CO2 25 22  GLUCOSE 521* 293*  BUN 39* 38*  CREATININE 1.38* 1.17*  CALCIUM 9.1 8.7*    Medications:  I have reviewed the patient's current medications. Scheduled: . amLODipine  5 mg Oral Daily  . aspirin  325 mg Oral Daily  . cholecalciferol  2,000 Units Oral Daily  . fenofibrate  54 mg Oral Daily  . insulin aspart  0-20 Units Subcutaneous TID WC  . insulin aspart  0-5 Units Subcutaneous QHS  . insulin aspart  5 Units Subcutaneous TID WC  . insulin detemir  15 Units Subcutaneous NOW  . insulin detemir  30 Units Subcutaneous QHS  . midazolam  1 mg Intravenous Once  . simvastatin  40 mg Oral Daily  . vitamin C  1,000 mg Oral Daily   HTD:SKAJGOTL, fentaNYL (SUBLIMAZE) injection, hydrALAZINE, HYDROcodone-acetaminophen, ondansetron (ZOFRAN) IV, promethazine  Assessment/Plan: Nasal and maxillary fractures. No significant deformity. Likely will not need any surgical  intervention. Pt may follow up with me as an outpatient in approximately 1 week.   LOS: 2 days   Jannatul Wojdyla W Lorene Klimas 03/30/2017, 8:09 AM

## 2017-03-30 NOTE — Progress Notes (Signed)
Progress Note  Patient Name: Christy [name] Date of Encounter: 03/30/2017  Primary Cardiologist: Nahser  Subjective   76 yo hx of breast ca, HTN, DM,   Fell after church this weekend. troponins were found to be elevated.   Inpatient Medications    Scheduled Meds: . [START ON 03/31/2017] amLODipine  10 mg Oral Daily  . amLODipine  5 mg Oral Once  . aspirin  325 mg Oral Daily  . cholecalciferol  2,000 Units Oral Daily  . fenofibrate  54 mg Oral Daily  . heparin subcutaneous  5,000 Units Subcutaneous Q8H  . insulin aspart  0-20 Units Subcutaneous TID WC  . insulin aspart  0-5 Units Subcutaneous QHS  . insulin aspart  5 Units Subcutaneous TID WC  . insulin detemir  35 Units Subcutaneous QHS  . midazolam  1 mg Intravenous Once  . simvastatin  40 mg Oral Daily  . vitamin C  1,000 mg Oral Daily   Continuous Infusions: . sodium chloride 75 mL/hr at 03/28/17 2333  .  ceFAZolin (ANCEF) IV Stopped (03/30/17 0545)   PRN Meds: dextrose, fentaNYL (SUBLIMAZE) injection, hydrALAZINE, HYDROcodone-acetaminophen, ondansetron (ZOFRAN) IV, promethazine   Vital Signs    Vitals:   03/30/17 0600 03/30/17 0620 03/30/17 0841 03/30/17 1129  BP: (!) 179/58 (!) 193/68 (!) 181/45 (!) 150/46  Pulse: 89     Resp:      Temp:      TempSrc:      SpO2:      Weight:      Height:        Intake/Output Summary (Last 24 hours) at 03/30/17 1325 Last data filed at 03/30/17 1000  Gross per 24 hour  Intake              240 ml  Output             1050 ml  Net             -810 ml   Filed Weights   03/28/17 1512 03/28/17 2213  Weight: 225 lb (102.1 kg) 224 lb 15.7 oz (102 kg)    Telemetry    NSR  - Personally Reviewed  ECG    NSR , LBBB  - Personally Reviewed  Physical Exam   GEN:  sleepy,  Facial trauma  Neck: No JVD Cardiac: RRR, no murmurs, rubs, or gallops.  Respiratory: Clear to auscultation bilaterally. GI: Soft, nontender, non-distended  MS: No edema; No deformity. Neuro:   Nonfocal  Psych: Normal affect   Labs    Chemistry Recent Labs Lab 03/28/17 1645 03/29/17 0623 03/29/17 2012  NA 135 142 141  K 5.1 4.6 5.3*  CL 98* 103 108  CO2 23 25 22   GLUCOSE 546* 521* 293*  BUN 44* 39* 38*  CREATININE 1.70* 1.38* 1.17*  CALCIUM 9.7 9.1 8.7*  GFRNONAA 28* 36* 44*  GFRAA 33* 42* 51*  ANIONGAP 14 14 11      Hematology Recent Labs Lab 03/28/17 1645 03/29/17 0623  WBC 13.5* 15.7*  RBC 4.40 4.01  HGB 13.5 12.1  HCT 40.7 37.6  MCV 92.5 93.8  MCH 30.7 30.2  MCHC 33.2 32.2  RDW 13.6 13.5  PLT 192 184    Cardiac Enzymes Recent Labs Lab 03/28/17 2034 03/29/17 0132 03/29/17 0623 03/29/17 1528  TROPONINI 0.03* 0.08* 0.21* 0.17*   No results for input(s): TROPIPOC in the last 168 hours.   BNP Recent Labs Lab 03/28/17 1645  BNP 14.0  DDimer No results for input(s): DDIMER in the last 168 hours.   Radiology    Dg Chest 1 View  Result Date: 03/28/2017 CLINICAL DATA:  Legs gave out, patient fell, deformities to LEFT and RIGHT wrists, laceration to bridge of nose, LEFT knee abrasion EXAM: CHEST 1 VIEW COMPARISON:  07/02/2010 FINDINGS: Enlargement of cardiac silhouette with slight vascular congestion. Mediastinal contours normal. Interval removal of Port-A-Cath. Atherosclerotic calcification aorta. Mild bibasilar atelectasis versus infiltrate. Upper lungs clear. No pleural effusion or pneumothorax. Bones demineralized. IMPRESSION: Enlargement of cardiac silhouette with pulmonary vascular congestion. Mild bibasilar atelectasis versus infiltrate. Electronically Signed   By: Lavonia Dana M.D.   On: 03/28/2017 18:27   Dg Wrist Complete Left  Result Date: 03/28/2017 CLINICAL DATA:  76 year old female with history of trauma from a fall complaining of pain in the left wrist. EXAM: LEFT WRIST - COMPLETE 3+ VIEW COMPARISON:  Left hand radiograph 06/13/2009. FINDINGS: Highly comminuted fractures of the distal radius and ulna are noted. The radial fracture  is highly comminuted, intra-articular, impacted and displaced, with extension of the fracture through the metaphyseal region into the distal diaphyseal region. Individual fracture fragments are displaced in a random distribution, generally extending outward from the distal radius. There is some very mild volar angulation on the cross-table lateral view (approximately 15-20 degrees). Distal ulnar fracture is also highly comminuted, with radial displacement of the distal ulna approximately 1 to 1-1/2 shaft width. There also appears to be some distal migration of the proximal ulnar fragment into the overlying soft tissues which are markedly swollen. The carpal some cells appear intact. IMPRESSION: 1. Highly comminuted, displaced and angulated intra-articular fractures of the distal radius and ulna, as detailed above. Electronically Signed   By: Vinnie Langton M.D.   On: 03/28/2017 18:27   Dg Wrist Complete Right  Result Date: 03/28/2017 CLINICAL DATA:  Recent fall with wrist pain, initial encounter EXAM: RIGHT WRIST - COMPLETE 3+ VIEW COMPARISON:  06/11/2009 FINDINGS: Fixation sideplate is noted along the distal radius in an area of prior fracture. A new fracture is noted in the radial metaphysis with posterior angulation. Additionally there is fracture of the fixation sideplate distally at the area of fracture. Deformity of the distal ulna is noted related to prior fracture and healing. No other are seen. IMPRESSION: Changes consistent with previous healed radial and ulnar fractures with surgical fixation. New radial metaphyseal fracture with posterior angulation and fracture of the fixation sideplate. Electronically Signed   By: Inez Catalina M.D.   On: 03/28/2017 18:26   Ct Head Wo Contrast  Result Date: 03/28/2017 CLINICAL DATA:  Recent fall with facial injury and headaches, initial encounter EXAM: CT HEAD WITHOUT CONTRAST CT MAXILLOFACIAL WITHOUT CONTRAST TECHNIQUE: Multidetector CT imaging of the head  and maxillofacial structures were performed using the standard protocol without intravenous contrast. Multiplanar CT image reconstructions of the maxillofacial structures were also generated. COMPARISON:  06/11/2009 FINDINGS: CT HEAD FINDINGS Brain: Diffuse atrophic changes are noted. No findings to suggest acute hemorrhage, acute infarction or space-occupying mass lesion are seen. Areas of chronic white matter ischemic change are noted stable from the prior exam. Vascular: No hyperdense vessel or unexpected calcification. Skull: The skull is intact. Multiple fractures are noted involving the nasal bones as well as the maxillary antra bilaterally. These will be better evaluated on the maxillofacial CT. Other: Air-fluid levels are noted in the maxillary antra bilaterally. CT MAXILLOFACIAL FINDINGS Osseous: Bilateral nasal bone fractures are noted with mild impaction. Fractures of the pterygoid  plates are noted with mild displacement bilaterally. Fracture lines are also noted within the anterior and lateral walls of the maxillary antra bilaterally. Involvement of the posterior walls of the maxillary antra are noted as well. No definitive orbital fracture is seen. No muscular entrapment noted. Zygomatic arches are within normal limits. Orbits: The orbits and their contents are within normal limits. Sinuses: Considerable soft tissue swelling is noted within the nasal passages. Air-fluid levels are noted within the maxillary antra bilaterally. Soft tissues: Mild soft tissue swelling is noted in the right forehead. No sizable hematoma seen. No other soft tissue abnormality is noted. IMPRESSION: CT of the maxillofacial bones: Multiple fractures involving the maxillary antra bilaterally as well as the pterygoid plates bilaterally. No definitive orbital involvement is noted. Air-fluid levels within the maxillary antra bilaterally. CT of the head: Chronic changes without acute abnormality. Electronically Signed   By: Inez Catalina M.D.   On: 03/28/2017 17:53   Dg Knee Complete 4 Views Left  Result Date: 03/28/2017 CLINICAL DATA:  Recent fall with knee pain, initial encounter EXAM: LEFT KNEE - COMPLETE 4+ VIEW COMPARISON:  10/12/2016 FINDINGS: There is a transverse fracture through the midportion of the patella with mild distraction of the fracture fragments of approximately 1 cm. Overlying soft tissue swelling and small joint effusion is noted. No other fracture or dislocation is seen. IMPRESSION: Patellar fracture with small joint effusion. No other bony abnormality is seen. Electronically Signed   By: Inez Catalina M.D.   On: 03/28/2017 18:24   Ct Maxillofacial Wo Cm  Result Date: 03/28/2017 CLINICAL DATA:  Recent fall with facial injury and headaches, initial encounter EXAM: CT HEAD WITHOUT CONTRAST CT MAXILLOFACIAL WITHOUT CONTRAST TECHNIQUE: Multidetector CT imaging of the head and maxillofacial structures were performed using the standard protocol without intravenous contrast. Multiplanar CT image reconstructions of the maxillofacial structures were also generated. COMPARISON:  06/11/2009 FINDINGS: CT HEAD FINDINGS Brain: Diffuse atrophic changes are noted. No findings to suggest acute hemorrhage, acute infarction or space-occupying mass lesion are seen. Areas of chronic white matter ischemic change are noted stable from the prior exam. Vascular: No hyperdense vessel or unexpected calcification. Skull: The skull is intact. Multiple fractures are noted involving the nasal bones as well as the maxillary antra bilaterally. These will be better evaluated on the maxillofacial CT. Other: Air-fluid levels are noted in the maxillary antra bilaterally. CT MAXILLOFACIAL FINDINGS Osseous: Bilateral nasal bone fractures are noted with mild impaction. Fractures of the pterygoid plates are noted with mild displacement bilaterally. Fracture lines are also noted within the anterior and lateral walls of the maxillary antra bilaterally.  Involvement of the posterior walls of the maxillary antra are noted as well. No definitive orbital fracture is seen. No muscular entrapment noted. Zygomatic arches are within normal limits. Orbits: The orbits and their contents are within normal limits. Sinuses: Considerable soft tissue swelling is noted within the nasal passages. Air-fluid levels are noted within the maxillary antra bilaterally. Soft tissues: Mild soft tissue swelling is noted in the right forehead. No sizable hematoma seen. No other soft tissue abnormality is noted. IMPRESSION: CT of the maxillofacial bones: Multiple fractures involving the maxillary antra bilaterally as well as the pterygoid plates bilaterally. No definitive orbital involvement is noted. Air-fluid levels within the maxillary antra bilaterally. CT of the head: Chronic changes without acute abnormality. Electronically Signed   By: Inez Catalina M.D.   On: 03/28/2017 17:53    Cardiac Studies     Patient Profile  76 y.o. female with mild troponin elevations.   Assessment & Plan    1. Troponin elevation : Likely stress induced after the fall.   Has normal LV function by echo.  No evidence of any cardiac complications following surgery .   She should see [place] in the clinic for follow up evaluation after she heals up from this fractures  Will sign off.  Call for questions.   Signed, Mertie Moores, MD  03/30/2017, 1:25 PM

## 2017-03-30 NOTE — Progress Notes (Signed)
[name] b/p is elevated this morning- seen in [facility]. NP paged about giving something since pt was already given pain medication. Will pass on to day RN

## 2017-03-30 NOTE — Evaluation (Signed)
Occupational Therapy Evaluation Patient Details Name: Christy [name] MRN: [id number] DOB: 06-Dec-1940 Today's Date: 03/30/2017    History of Present Illness 76 y.o.Bosnian only speaking femalewith medical history significant of HTN, DM type II, osteoporosis, breast cancer; who presentafter having a fall. Patient was leaving church with her son when she states her left leg wentout and she fell face forward onto the side of the road and sustained bilateral distal radius, patella, and facial fractures. Underwent bilateral wrist surgery on 03/29/16.    Clinical Impression   PTA, pt was living with her son and performing ADLs. Pt currently is Max A - Total A for ADLs. Pt requires Max A +3 for bed mobility and Max A to maintain sitting balance at EOB. Pt would benefit from acute OT to increase independence and safety with ADLs and functional mobility. Recommend dc to SNF once medically stable per physician.      Follow Up Recommendations  SNF;Supervision/Assistance - 24 hour    Equipment Recommendations  Other (comment) (Defer to next venue)    Recommendations for Other Services PT consult     Precautions / Restrictions Precautions Precautions: Fall Restrictions Weight Bearing Restrictions: Yes (NWB) RUE Weight Bearing: Non weight bearing LUE Weight Bearing: Non weight bearing      Mobility Bed Mobility Overal bed mobility: Needs Assistance Bed Mobility: Supine to Sit;Sit to Supine     Supine to sit: Max assist;+2 for physical assistance;HOB elevated Sit to supine: Max assist;+2 for physical assistance   General bed mobility comments: Pt requires A to elevate trunk and transition hips to EOB  Transfers                 General transfer comment: Did not perform. Awaiting knee surgery    Balance Overall balance assessment: Needs assistance Sitting-balance support: No upper extremity supported;Feet supported (Single R foot on ground ) Sitting balance-Leahy Scale:  Poor Sitting balance - Comments: Required Max A to maintain sitting balance at EOB Postural control: Posterior lean;Left lateral lean                                 ADL either performed or assessed with clinical judgement   ADL Overall ADL's : Needs assistance/impaired                                       General ADL Comments: Max A for ADLs due to pain and immobilization. Pt requires RN or OT to bring cup to mouth for water. Pt requires Max A to maintain sitting balance at EOB.      Vision         Perception     Praxis      Pertinent Vitals/Pain Pain Assessment: Faces Faces Pain Scale: Hurts even more Pain Location: Both hands and L knee Pain Descriptors / Indicators: Constant;Discomfort;Grimacing;Guarding;Moaning Pain Intervention(s): Limited activity within patient's tolerance;Repositioned;Monitored during session     Hand Dominance Right   Extremity/Trunk Assessment Upper Extremity Assessment Upper Extremity Assessment: RUE deficits/detail;LUE deficits/detail RUE Deficits / Details: L shoulder WFL. AROM flex. immobilized at forearm. wiggle fingers RUE: Unable to fully assess due to pain;Unable to fully assess due to immobilization RUE Sensation:  (intact) RUE Coordination: decreased fine motor;decreased gross motor LUE Deficits / Details: L shoulder WFL. AROM flex. immobilized at forearm. wiggle fingers - less movement of fingers  on L hand LUE: Unable to fully assess due to pain;Unable to fully assess due to immobilization LUE Sensation:  (intact) LUE Coordination: decreased fine motor;decreased gross motor   Lower Extremity Assessment Lower Extremity Assessment: LLE deficits/detail LLE Deficits / Details: LLE KI in place LLE: Unable to fully assess due to pain;Unable to fully assess due to immobilization LLE Coordination: decreased fine motor;decreased gross motor   Cervical / Trunk Assessment Cervical / Trunk Assessment:  Kyphotic   Communication Communication Communication: Prefers language other than English (Used interpreter)   Cognition Arousal/Alertness: Lethargic Behavior During Therapy: WFL for tasks assessed/performed Overall Cognitive Status: Difficult to assess                                     General Comments  In person interpreter present    Exercises     Shoulder Instructions      Home Living Family/patient expects to be discharged to:: Skilled nursing facility Living Arrangements: Children (son)   Type of Home: Apartment (moving june 1 to new apt on ground floor) Home Access: Level entry           Bathroom Shower/Tub: Tub/shower unit         Home Equipment: Environmental consultant - 2 wheels;Cane - single point;Bedside commode;Shower seat          Prior Functioning/Environment Level of Independence: Independent with assistive device(s)        Comments: Walking weith cane PTA, Indpeendent in ADLs and lignt meals prep        OT Problem List: Decreased strength;Decreased range of motion;Decreased activity tolerance;Impaired balance (sitting and/or standing);Decreased safety awareness;Decreased knowledge of use of DME or AE;Pain;Impaired UE functional use      OT Treatment/Interventions: Self-care/ADL training;Therapeutic exercise;Energy conservation;DME and/or AE instruction;Therapeutic activities;Patient/family education    OT Goals(Current goals can be found in the care plan section) Acute Rehab OT Goals Patient Stated Goal: Stop pain and rest OT Goal Formulation: With patient Time For Goal Achievement: 03/30/17 Potential to Achieve Goals: Good ADL Goals Pt Will Perform Eating: with min assist;sitting Pt Will Perform Grooming: with min assist;sitting Pt Will Perform Upper Body Dressing: sitting;with mod assist;with caregiver independent in assisting Pt Will Perform Lower Body Dressing: sit to/from stand;with max assist;with caregiver independent in  assisting Pt Will Transfer to Toilet: with max assist;squat pivot transfer;bedside commode  OT Frequency: Min 2X/week   Barriers to D/C:            Co-evaluation              AM-PAC PT "6 Clicks" Daily Activity     Outcome Measure Help from another person eating meals?: Total Help from another person taking care of personal grooming?: Total Help from another person toileting, which includes using toliet, bedpan, or urinal?: Total Help from another person bathing (including washing, rinsing, drying)?: A Lot Help from another person to put on and taking off regular upper body clothing?: A Lot Help from another person to put on and taking off regular lower body clothing?: Total 6 Click Score: 8   End of Session Equipment Utilized During Treatment: Left knee immobilizer Nurse Communication: Mobility status;Patient requests pain meds;Weight bearing status;Precautions  Activity Tolerance: Patient limited by pain;Patient limited by lethargy;Patient limited by fatigue;Patient tolerated treatment well Patient left: in bed;with call bell/phone within reach;with family/visitor present  OT Visit Diagnosis: Repeated falls (R29.6);Pain Pain - Right/Left:  (Bilateral) Pain -  part of body: Arm;Leg                Time: 9179-1505 OT Time Calculation (min): 28 min Charges:  OT General Charges $OT Visit: 1 Procedure OT Evaluation $OT Eval Moderate Complexity: 1 Procedure OT Treatments $Self Care/Home Management : 8-22 mins G-Codes:     [employer], OTR/L [place] 03/30/2017, 2:38 PM

## 2017-03-31 ENCOUNTER — Inpatient Hospital Stay (HOSPITAL_COMMUNITY): Payer: Medicare Other | Admitting: Certified Registered Nurse Anesthetist

## 2017-03-31 ENCOUNTER — Encounter (HOSPITAL_COMMUNITY): Payer: Self-pay | Admitting: Certified Registered Nurse Anesthetist

## 2017-03-31 ENCOUNTER — Encounter (HOSPITAL_COMMUNITY)
Admission: EM | Disposition: A | Discharge status: Skilled Nursing Facility | Payer: Self-pay | Source: Home / Self Care | Attending: Family Medicine

## 2017-03-31 DIAGNOSIS — S82042A Displaced comminuted fracture of left patella, initial encounter for closed fracture: Secondary | ICD-10-CM

## 2017-03-31 HISTORY — PX: PATELLECTOMY: SHX1022

## 2017-03-31 LAB — CBC
HCT: 31.2 % — ABNORMAL LOW (ref 36.0–46.0)
HEMOGLOBIN: 9.8 g/dL — AB (ref 12.0–15.0)
MCH: 30.9 pg (ref 26.0–34.0)
MCHC: 31.4 g/dL (ref 30.0–36.0)
MCV: 98.4 fL (ref 78.0–100.0)
PLATELETS: 151 10*3/uL (ref 150–400)
RBC: 3.17 MIL/uL — ABNORMAL LOW (ref 3.87–5.11)
RDW: 13.5 % (ref 11.5–15.5)
WBC: 11.7 10*3/uL — ABNORMAL HIGH (ref 4.0–10.5)

## 2017-03-31 LAB — BASIC METABOLIC PANEL
Anion gap: 6 (ref 5–15)
BUN: 21 mg/dL — AB (ref 6–20)
CHLORIDE: 103 mmol/L (ref 101–111)
CO2: 29 mmol/L (ref 22–32)
CREATININE: 0.84 mg/dL (ref 0.44–1.00)
Calcium: 7.9 mg/dL — ABNORMAL LOW (ref 8.9–10.3)
GFR calc Af Amer: >60 mL/min (ref >60)
GFR calc non Af Amer: >60 mL/min (ref >60)
GLUCOSE: 219 mg/dL — AB (ref 65–99)
POTASSIUM: 3.8 mmol/L (ref 3.5–5.1)
SODIUM: 138 mmol/L (ref 135–145)

## 2017-03-31 LAB — GLUCOSE, CAPILLARY
GLUCOSE-CAPILLARY: 185 mg/dL — AB (ref 65–99)
Glucose-Capillary: 146 mg/dL — ABNORMAL HIGH (ref 65–99)
Glucose-Capillary: 217 mg/dL — ABNORMAL HIGH (ref 65–99)
Glucose-Capillary: 195 mg/dL — ABNORMAL HIGH (ref 65–99)
Glucose-Capillary: 195 mg/dL — ABNORMAL HIGH (ref 65–99)
Glucose-Capillary: 234 mg/dL — ABNORMAL HIGH (ref 65–99)

## 2017-03-31 SURGERY — PATELLECTOMY
Anesthesia: General | Site: Knee | Laterality: Left

## 2017-03-31 MED ORDER — CEFAZOLIN SODIUM-DEXTROSE 2-4 GM/100ML-% IV SOLN: 2.0000 g | Freq: Once | INTRAVENOUS | Status: DC

## 2017-03-31 MED ORDER — PROPOFOL 10 MG/ML IV BOLUS
INTRAVENOUS | Status: AC
  Filled 2017-03-31: qty 20

## 2017-03-31 MED ORDER — LABETALOL HCL 5 MG/ML IV SOLN
INTRAVENOUS | Status: DC | PRN
  Administered 2017-03-31: 5 mg via INTRAVENOUS

## 2017-03-31 MED ORDER — BUPIVACAINE HCL (PF) 0.25 % IJ SOLN
INTRAMUSCULAR | Status: DC | PRN
  Administered 2017-03-31: 15 mL

## 2017-03-31 MED ORDER — 0.9 % SODIUM CHLORIDE (POUR BTL) OPTIME
TOPICAL | Status: DC | PRN
  Administered 2017-03-31: 3000 mL

## 2017-03-31 MED ORDER — CEFAZOLIN SODIUM-DEXTROSE 2-4 GM/100ML-% IV SOLN
INTRAVENOUS | Status: AC
  Filled 2017-03-31: qty 100

## 2017-03-31 MED ORDER — FENTANYL CITRATE (PF) 250 MCG/5ML IJ SOLN
INTRAMUSCULAR | Status: AC
  Filled 2017-03-31: qty 5

## 2017-03-31 MED ORDER — SUGAMMADEX SODIUM 200 MG/2ML IV SOLN
INTRAVENOUS | Status: DC | PRN
  Administered 2017-03-31: 200 mg via INTRAVENOUS

## 2017-03-31 MED ORDER — PROPOFOL 10 MG/ML IV BOLUS
INTRAVENOUS | Status: DC | PRN
  Administered 2017-03-31: 100 mg via INTRAVENOUS

## 2017-03-31 MED ORDER — SUGAMMADEX SODIUM 200 MG/2ML IV SOLN
INTRAVENOUS | Status: AC
  Filled 2017-03-31: qty 2

## 2017-03-31 MED ORDER — METFORMIN HCL ER 500 MG PO TB24: 1000.0000 mg | ORAL_TABLET | Freq: Two times a day (BID) | ORAL | Status: DC

## 2017-03-31 MED ORDER — FENTANYL CITRATE (PF) 100 MCG/2ML IJ SOLN
25.0000 ug | INTRAMUSCULAR | Status: DC | PRN
  Administered 2017-03-31: 25 ug via INTRAVENOUS

## 2017-03-31 MED ORDER — LABETALOL HCL 5 MG/ML IV SOLN
INTRAVENOUS | Status: AC
  Filled 2017-03-31: qty 4

## 2017-03-31 MED ORDER — LIDOCAINE 2% (20 MG/ML) 5 ML SYRINGE
INTRAMUSCULAR | Status: AC
  Filled 2017-03-31: qty 5

## 2017-03-31 MED ORDER — BUPIVACAINE HCL (PF) 0.25 % IJ SOLN
INTRAMUSCULAR | Status: AC
  Filled 2017-03-31: qty 30

## 2017-03-31 MED ORDER — CEFAZOLIN SODIUM-DEXTROSE 2-3 GM-% IV SOLR
INTRAVENOUS | Status: DC | PRN
  Administered 2017-03-31: 2 g via INTRAVENOUS

## 2017-03-31 MED ORDER — LACTATED RINGERS IV SOLN
INTRAVENOUS | Status: DC
  Administered 2017-04-04: via INTRAVENOUS

## 2017-03-31 MED ORDER — FUROSEMIDE 40 MG PO TABS
40.0000 mg | ORAL_TABLET | Freq: Every day | ORAL | Status: DC
  Administered 2017-03-31 – 2017-04-05 (×6): 40 mg via ORAL
  Filled 2017-03-31 (×6): qty 1

## 2017-03-31 MED ORDER — ROCURONIUM BROMIDE 10 MG/ML (PF) SYRINGE
PREFILLED_SYRINGE | INTRAVENOUS | Status: DC | PRN
  Administered 2017-03-31: 40 mg via INTRAVENOUS

## 2017-03-31 MED ORDER — LACTATED RINGERS IV SOLN
INTRAVENOUS | Status: DC | PRN
  Administered 2017-03-31: 16:00:00 via INTRAVENOUS

## 2017-03-31 MED ORDER — FENTANYL CITRATE (PF) 100 MCG/2ML IJ SOLN
INTRAMUSCULAR | Status: DC | PRN
  Administered 2017-03-31: 100 ug via INTRAVENOUS

## 2017-03-31 MED ORDER — LIDOCAINE 2% (20 MG/ML) 5 ML SYRINGE
INTRAMUSCULAR | Status: DC | PRN
  Administered 2017-03-31: 80 mg via INTRAVENOUS

## 2017-03-31 MED ORDER — INSULIN ASPART 100 UNIT/ML ~~LOC~~ SOLN: 30.0000 [IU] | Freq: Two times a day (BID) | SUBCUTANEOUS | Status: DC

## 2017-03-31 MED ORDER — ONDANSETRON HCL 4 MG/2ML IJ SOLN
INTRAMUSCULAR | Status: AC
  Filled 2017-03-31: qty 2

## 2017-03-31 MED ORDER — FENTANYL CITRATE (PF) 100 MCG/2ML IJ SOLN
INTRAMUSCULAR | Status: AC
  Filled 2017-03-31: qty 2

## 2017-03-31 MED ORDER — ONDANSETRON HCL 4 MG/2ML IJ SOLN: 4.0000 mg | Freq: Once | INTRAMUSCULAR | Status: DC | PRN

## 2017-03-31 MED ORDER — HYDRALAZINE HCL 20 MG/ML IJ SOLN
INTRAMUSCULAR | Status: AC
  Filled 2017-03-31: qty 1

## 2017-03-31 MED ORDER — LISINOPRIL 40 MG PO TABS
40.0000 mg | ORAL_TABLET | Freq: Every day | ORAL | Status: DC
  Administered 2017-03-31 – 2017-04-05 (×5): 40 mg via ORAL
  Filled 2017-03-31 (×5): qty 1

## 2017-03-31 MED ORDER — IBUPROFEN 200 MG PO TABS
800.0000 mg | ORAL_TABLET | Freq: Three times a day (TID) | ORAL | Status: DC | PRN
Start: 2017-03-31 — End: 2017-04-06
  Administered 2017-04-01 – 2017-04-04 (×5): 800 mg via ORAL
  Filled 2017-03-31 (×5): qty 4

## 2017-03-31 SURGICAL SUPPLY — 54 items
BANDAGE ACE 4X5 VEL STRL LF (GAUZE/BANDAGES/DRESSINGS) IMPLANT
BANDAGE ACE 6X5 VEL STRL LF (GAUZE/BANDAGES/DRESSINGS) ×3 IMPLANT
BNDG COHESIVE 4X5 TAN STRL (GAUZE/BANDAGES/DRESSINGS) IMPLANT
BNDG COHESIVE 6X5 TAN STRL LF (GAUZE/BANDAGES/DRESSINGS) ×3 IMPLANT
COVER SURGICAL LIGHT HANDLE (MISCELLANEOUS) ×3 IMPLANT
CUFF TOURNIQUET SINGLE 34IN LL (TOURNIQUET CUFF) IMPLANT
CUFF TOURNIQUET SINGLE 44IN (TOURNIQUET CUFF) IMPLANT
DRAPE C-ARM 42X72 X-RAY (DRAPES) IMPLANT
DRAPE C-ARMOR (DRAPES) IMPLANT
DRAPE IMP U-DRAPE 54X76 (DRAPES) ×6 IMPLANT
DRAPE INCISE IOBAN 66X45 STRL (DRAPES) ×3 IMPLANT
DRAPE U-SHAPE 47X51 STRL (DRAPES) IMPLANT
DRSG MEPILEX BORDER 4X8 (GAUZE/BANDAGES/DRESSINGS) ×3 IMPLANT
DURAPREP 26ML APPLICATOR (WOUND CARE) ×3 IMPLANT
ELECT CAUTERY BLADE 6.4 (BLADE) ×3 IMPLANT
ELECT REM PT RETURN 9FT ADLT (ELECTROSURGICAL) ×3
ELECTRODE REM PT RTRN 9FT ADLT (ELECTROSURGICAL) ×1 IMPLANT
FACESHIELD WRAPAROUND (MASK) IMPLANT
GAUZE SPONGE 4X4 12PLY STRL (GAUZE/BANDAGES/DRESSINGS) ×3 IMPLANT
GAUZE XEROFORM 1X8 LF (GAUZE/BANDAGES/DRESSINGS) ×3 IMPLANT
GAUZE XEROFORM 5X9 LF (GAUZE/BANDAGES/DRESSINGS) IMPLANT
GLOVE SKINSENSE NS SZ7.5 (GLOVE) ×4
GLOVE SKINSENSE STRL SZ7.5 (GLOVE) ×2 IMPLANT
GOWN STRL REIN XL XLG (GOWN DISPOSABLE) ×9 IMPLANT
KIT BASIN OR (CUSTOM PROCEDURE TRAY) ×3 IMPLANT
KIT ROOM TURNOVER OR (KITS) ×3 IMPLANT
NEEDLE HYPO 25GX1X1/2 BEV (NEEDLE) IMPLANT
NS IRRIG 1000ML POUR BTL (IV SOLUTION) ×3 IMPLANT
PACK ORTHO EXTREMITY (CUSTOM PROCEDURE TRAY) ×3 IMPLANT
PACK UNIVERSAL I (CUSTOM PROCEDURE TRAY) ×3 IMPLANT
PAD ARMBOARD 7.5X6 YLW CONV (MISCELLANEOUS) ×6 IMPLANT
PAD CAST 3X4 CTTN HI CHSV (CAST SUPPLIES) IMPLANT
PADDING CAST COTTON 3X4 STRL (CAST SUPPLIES)
PADDING CAST COTTON 6X4 STRL (CAST SUPPLIES) IMPLANT
PASSER SUT SWANSON 36MM LOOP (INSTRUMENTS) IMPLANT
SPONGE LAP 18X18 X RAY DECT (DISPOSABLE) ×3 IMPLANT
STOCKINETTE IMPERVIOUS LG (DRAPES) ×3 IMPLANT
SUCTION FRAZIER HANDLE 10FR (MISCELLANEOUS) ×2
SUCTION TUBE FRAZIER 10FR DISP (MISCELLANEOUS) ×1 IMPLANT
SUT ETHIBOND 5 LR DA (SUTURE) ×9 IMPLANT
SUT ETHILON 2 0 FS 18 (SUTURE) IMPLANT
SUT ETHILON 3 0 PS 1 (SUTURE) IMPLANT
SUT FIBERWIRE #2 38 T-5 BLUE (SUTURE)
SUT VIC AB 0 CT1 27 (SUTURE)
SUT VIC AB 0 CT1 27XBRD ANBCTR (SUTURE) IMPLANT
SUT VIC AB 2-0 CT1 27 (SUTURE)
SUT VIC AB 2-0 CT1 TAPERPNT 27 (SUTURE) IMPLANT
SUTURE FIBERWR #2 38 T-5 BLUE (SUTURE) IMPLANT
SYR CONTROL 10ML LL (SYRINGE) IMPLANT
TOWEL OR 17X24 6PK STRL BLUE (TOWEL DISPOSABLE) ×3 IMPLANT
TOWEL OR 17X26 10 PK STRL BLUE (TOWEL DISPOSABLE) ×6 IMPLANT
TUBE CONNECTING 12'X1/4 (SUCTIONS) ×1
TUBE CONNECTING 12X1/4 (SUCTIONS) ×2 IMPLANT
WATER STERILE IRR 1000ML POUR (IV SOLUTION) ×3 IMPLANT

## 2017-03-31 NOTE — Op Note (Signed)
   Date of Surgery: 03/31/2017  INDICATIONS: Christy [name] is a 76 y.o.-year-old female with a left patella fracture;  The patient did consent to the procedure after discussion of the risks and benefits.  PREOPERATIVE DIAGNOSIS: Left patella fracture  POSTOPERATIVE DIAGNOSIS: Same.  PROCEDURE: Left partial patellectomy and patellar tendon advancement  SURGEON: N. Eduard Roux, M.D.  ASSIST: April Green, RNFA.  ANESTHESIA:  general  IV FLUIDS AND URINE: See anesthesia.  ESTIMATED BLOOD LOSS: minimal mL.  IMPLANTS: none  DRAINS: none  COMPLICATIONS: None.  DESCRIPTION OF PROCEDURE: The patient was brought to the operating room and placed supine on the operating table.  The patient had been signed prior to the procedure and this was documented. The patient had the anesthesia placed by the anesthesiologist.  A time-out was performed to confirm that this was the correct patient, site, side and location. The patient did receive antibiotics prior to the incision and was re-dosed during the procedure as needed at indicated intervals.  A tourniquet was placed.  The patient had the operative extremity prepped and draped in the standard surgical fashion.    A midline incision was created over the anterior aspect of the knee. Dissection was carried down to the peritenon. Full-thickness flaps were elevated. The fracture site was exposed and demonstrated significant comminution of the inferior pole which was not amenable to internal fixation. The decision was made to perform a partial patellectomy. I excised out the comminuted inferior pole the patella using a 15 blade. The distal edge of the patella fracture was then Francee Piccolo to create a smooth surface for tendon approximation. 4 parallel strands of #5 FiberWire were advanced up and down the patellar tendon using a Krakw fashion. 3 drill holes were then made through the patella in brought out superiorly. Suture passers were then used to pass sutures  through the drill holes. The knee was then placed in full extension and the sutures were tied down in order to approximate the tendon against the patellar fracture surface. The retinaculum was then repaired using interrupted #1 Vicryl. The wound was then thoroughly irrigated and closed in layer fashion using 0 Vicryl, 2-0 Vicryl, 2-0 nylon. Sterile dressings were applied. Patient tolerated procedure well and no immediate competitions. The leg was placed in a knee immobilizer.  POSTOPERATIVE PLAN: Patient will be weight-bear as tolerated to the left lower extremity in the knee immobilizer.  Christy Cecil, MD [place] 5:52 PM

## 2017-03-31 NOTE — Anesthesia Procedure Notes (Signed)
Procedure Name: Intubation Date/Time: 03/31/2017 5:04 PM Performed by: Trixie Deis A Pre-anesthesia Checklist: Patient identified, Emergency Drugs available, Suction available and Patient being monitored Patient Re-evaluated:Patient Re-evaluated prior to inductionOxygen Delivery Method: Circle System Utilized Preoxygenation: Pre-oxygenation with 100% oxygen Intubation Type: IV induction Ventilation: Mask ventilation without difficulty Laryngoscope Size: Mac and 3 Grade View: Grade I Tube type: Oral Tube size: 7.5 mm Number of attempts: 1 Airway Equipment and Method: Stylet and Oral airway Placement Confirmation: ETT inserted through vocal cords under direct vision,  positive ETCO2 and breath sounds checked- equal and bilateral Secured at: 21 cm Tube secured with: Tape Dental Injury: Teeth and Oropharynx as per pre-operative assessment

## 2017-03-31 NOTE — Progress Notes (Signed)
Inpatient Diabetes Program Recommendations  AACE/ADA: New Consensus Statement on Inpatient Glycemic Control (2015)  Target Ranges:  Prepandial:   less than 140 mg/dL      Peak postprandial:   less than 180 mg/dL (1-2 hours)      Critically ill patients:  140 - 180 mg/dL   Review of Glycemic Control  Diabetes history: DM 2 Outpatient Diabetes medications: Levemir 26 units QHS, Novolog 30 units BID, Metformin 1000 mg BID  Spoke with patient through a phone interpreter (interpreter ID [phone]) about elevated glucose levels on admission and glucose control at home. Patient requesting water to moisten her mouth as she is NPO for surgery. Patient reports her glucose levels being in the 300's at home. Patient reports she never misses her insulin and says "she has to have it." Patient reports checking her glucose BID. Patient reports following and DM diet. She has not been sick nor have been on medication to cause the elevation.  Spoke with patient about f/u with her MD for medication adjustments. Patient reports through an interpreter that she just wants to die. Patient is overwhelmed by situation. Expressed empathy toward patient.  Thanks,  Tama Headings RN, MSN, [facility] Inpatient Diabetes Coordinator Team Pager [phone] (8a-5p)

## 2017-03-31 NOTE — Anesthesia Preprocedure Evaluation (Signed)
Anesthesia Evaluation  Patient identified by MRN, date of birth, ID band Patient confused    Reviewed: Allergy & Precautions, NPO status , Patient's Chart, lab work & pertinent test results  Airway Mallampati: IV  TM Distance: >3 FB Neck ROM: Full    Dental  (+) Dental Advisory Given, Edentulous Upper, Edentulous Lower   Pulmonary former smoker,    Pulmonary exam normal breath sounds clear to auscultation       Cardiovascular hypertension, Pt. on medications Normal cardiovascular exam+ dysrhythmias (LBBB)  Rhythm:Regular Rate:Normal  EF is nl by preliminary echo report with no obvious WMA    Neuro/Psych    GI/Hepatic negative GI ROS, Neg liver ROS,   Endo/Other  diabetes, Poorly Controlled, Type 2, Insulin Dependent, Oral Hypoglycemic Agents  Renal/GU ARF and Renal InsufficiencyRenal disease     Musculoskeletal negative musculoskeletal ROS (+)   Abdominal   Peds  Hematology negative hematology ROS (+)   Anesthesia Other Findings Day of surgery medications reviewed with the patient.  Reproductive/Obstetrics                             Anesthesia Physical  Anesthesia Plan  ASA: III  Anesthesia Plan: General   Post-op Pain Management:    Induction: Intravenous  Airway Management Planned: Oral ETT  Additional Equipment:   Intra-op Plan:   Post-operative Plan: Extubation in OR  Informed Consent: I have reviewed the patients History and Physical, chart, labs and discussed the procedure including the risks, benefits and alternatives for the proposed anesthesia with the patient or authorized representative who has indicated his/her understanding and acceptance.   Dental advisory given  Plan Discussed with: CRNA  Anesthesia Plan Comments: ([place] interpreter utilized throughout patient encounter.)        Anesthesia Quick Evaluation

## 2017-03-31 NOTE — Transfer of Care (Signed)
Immediate Anesthesia Transfer of Care Note  Patient: Amritha Broad  Procedure(s) Performed: Procedure(s): LEFT PARTIAL PATELLECTOMY (Left)  Patient Location: PACU  Anesthesia Type:General  Level of Consciousness: drowsy and patient cooperative  Airway & Oxygen Therapy: Patient Spontanous Breathing and Patient connected to nasal cannula oxygen  Post-op Assessment: Report given to RN, Post -op Vital signs reviewed and stable and Patient moving all extremities  Post vital signs: Reviewed and stable  Last Vitals:  Vitals:   03/31/17 1419 03/31/17 1424  BP: (!) 120/42   Pulse: 77   Resp: 16   Temp: (!) 38.1 C 37.2 C    Last Pain:  Vitals:   03/31/17 1424  TempSrc: Oral  PainSc:       Patients Stated Pain Goal: Other (Comment) (unable to assess) (35/52/17 4715)  Complications: No apparent anesthesia complications

## 2017-03-31 NOTE — Evaluation (Signed)
Physical Therapy Evaluation Patient Details Name: Christy [name] MRN: [id number] DOB: 02-18-1941 Today's Date: 03/31/2017   History of Present Illness  76 y.o. Bosnian only speaking female with medical history significant of HTN, DM type II, osteoporosis, breast cancer; who present after having a fall. Patient was leaving church with her son when she states her left leg went out and she fell face forward onto the side of the road and sustained bilateral distal radius, patella, and facial fractures. Underwent bilateral wrist surgery on 03/29/16.   Clinical Impression  Pt admitted with above diagnosis. Pt currently with functional limitations due to balance and endurance deficits. Pt was able to sit EOB x 22 min with supervision.  Pt still requires +2 total to max assist to come to EOB due to weight bearing restrictions.  YF:[facility]BS[facility] advise as to if pt could use a bilateral platform RW for transfers/mobility?  Also please update weight bearing left LE post surgery.  Will await updated orders after surgery.    Pt will benefit from skilled PT to increase their independence and safety with mobility to allow discharge to the venue listed below.      Follow Up Recommendations SNF;Supervision/Assistance - 24 hour    Equipment Recommendations  Other (comment) (TBA)    Recommendations for Other Services       Precautions / Restrictions Precautions Precautions: Fall Restrictions Weight Bearing Restrictions: Yes (NWB) RUE Weight Bearing: Non weight bearing LUE Weight Bearing: Non weight bearing Other Position/Activity Restrictions: Did not allow weight on left LE due to awaiting patellar surgery      Mobility  Bed Mobility Overal bed mobility: Needs Assistance Bed Mobility: Supine to Sit;Sit to Supine     Supine to sit: Max assist;Total assist;+2 for physical assistance;HOB elevated Sit to supine: Total assist;+2 for physical assistance   General bed mobility comments: Pt requires A to elevate  trunk and transition hips to EOB with use of pad.   Transfers                 General transfer comment: Did not perform. Awaiting knee surgery  Ambulation/Gait                Stairs            Wheelchair Mobility    Modified Rankin (Stroke Patients Only)       Balance Overall balance assessment: Needs assistance Sitting-balance support: No upper extremity supported (only right foot on floor) Sitting balance-Leahy Scale: Fair Sitting balance - Comments: Pt sat EOB 22 min with min guard assist to supervision. Washed pts back and hair while pt sitting EOb.  No assist needed.                                      Pertinent Vitals/Pain Pain Assessment: Faces Faces Pain Scale: Hurts even more Pain Location: Both hands and L knee Pain Descriptors / Indicators: Constant;Discomfort;Grimacing;Guarding;Moaning Pain Intervention(s): Limited activity within patient's tolerance;Monitored during session;Repositioned  91-92% on [place] expects to be discharged to:: Skilled nursing facility Living Arrangements: Children (son)   Type of Home: Apartment (moving june 1 to new apt on ground floor) Home Access: Level entry       Home Equipment: Walker - 2 wheels;Cane - single point;Bedside commode;Shower seat      Prior Function Level of Independence: Independent with assistive device(s)  Comments: Walking weith cane PTA, [employer] in ADLs and lignt meals prep     Hand Dominance   Dominant Hand: Right    Extremity/Trunk Assessment   Upper Extremity Assessment Upper Extremity Assessment: Defer to OT evaluation    Lower Extremity Assessment Lower Extremity Assessment: LLE deficits/detail LLE Deficits / Details: LLE KI in place LLE: Unable to fully assess due to pain;Unable to fully assess due to immobilization LLE Coordination: decreased fine motor;decreased gross motor    Cervical / Trunk  Assessment Cervical / Trunk Assessment: Kyphotic  Communication   Communication: Prefers language other than English (Used interpreter)  Cognition Arousal/Alertness: Awake/alert Behavior During Therapy: WFL for tasks assessed/performed Overall Cognitive Status: Difficult to assess                                 General Comments: Appears [facility] as interpreter # [id number] used and pt appears cognitively intact      General Comments General comments (skin integrity, edema, etc.): Telephone interpreter used.    Exercises General Exercises - Lower Extremity Long Arc Quad: AROM;5 reps;Seated   Assessment/Plan    PT Assessment Patient needs continued PT services  PT Problem List Decreased strength;Decreased range of motion;Decreased activity tolerance;Decreased balance;Decreased mobility;Decreased knowledge of use of DME;Decreased safety awareness;Decreased knowledge of precautions;Pain       PT Treatment Interventions DME instruction;Gait training;Functional mobility training;Therapeutic activities;Therapeutic exercise;Balance training;Patient/family education;Wheelchair mobility training    PT Goals (Current goals can be found in the Care Plan section)  Acute Rehab PT Goals Patient Stated Goal: Stop pain and rest PT Goal Formulation: With patient Time For Goal Achievement: 04/14/17 Potential to Achieve Goals: Good    Frequency Min 6X/week   Barriers to discharge Decreased caregiver support      Co-evaluation               AM-PAC PT "6 Clicks" Daily Activity  Outcome Measure Difficulty turning over in bed (including adjusting bedclothes, sheets and blankets)?: Total Difficulty moving from lying on back to sitting on the side of the bed? : Total Difficulty sitting down on and standing up from a chair with arms (e.g., wheelchair, bedside commode, etc,.)?: Total Help needed moving to and from a bed to chair (including a wheelchair)?: Total Help needed walking in  hospital room?: Total Help needed climbing 3-5 steps with a railing? : Total 6 Click Score: 6    End of Session Equipment Utilized During Treatment: Gait belt;Oxygen Activity Tolerance: Patient limited by fatigue;Patient limited by pain Patient left: in bed;with call bell/phone within reach;with family/visitor present Nurse Communication: Mobility status PT Visit Diagnosis: Muscle weakness (generalized) (M62.81);Pain;Other abnormalities of gait and mobility (R26.89) Pain - Right/Left:  (bil UEs, left LE) Pain - part of body: Hand;Leg;Knee    Time: 1054-1130 PT Time Calculation (min) (ACUTE ONLY): 36 min   Charges:     PT Treatments $Therapeutic Activity: 23-37 mins   PT G Codes:        [place] [phone] (pager)   Denice Paradise 03/31/2017, 1:16 PM

## 2017-03-31 NOTE — Progress Notes (Signed)
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   Assessment & Plan:   Principal Problem:   Multiple fractures Active Problems:   Uncontrolled type II diabetes mellitus (HCC)   Acute renal failure (ARF) (HCC)   Leukocytosis   Fall   Elevated troponin  bilateral distal radius fracture - Status postsurgical repair by Dr. Apolonio Schneiders 5/28 - Continue with when necessary fentanyl for pain,  - PT/OT to evaluate once stable.  Nasal and maxillary fractures.  - Management per ENT ,No significant deformity. Likely will not need any surgical intervention, to follow with ENT as an outpatient in one week .  Patella fracture - orthopedics consult greatly appreciated, plan for surgical repair today.  Elevated troponin - Surgery input greatly appreciated ,Most likely stress related, continue with aspirin, 2-D echo with no regional wall motion abnormalities. Patient has been seen in consultation by cardiology who felt no further cardiac workup needed at this time with outpatient follow-up.  Diabetes mellitus - Hemoglobin A1c 9.5 01/06/2017. Poorly controlled, required glucose stabilizer initially, was on Levemir 20, remained poorly controlled, which was increased to 35 units, and meal coverage NovoLog 5 units added as well as insulin sliding scale with better control. - Continue to hold oral hypoglycemic agents  ARF - Baseline creatinine 1.2, she presents with a creatinine of 1.7, and BUN of 44, improved with IV fluids.  Hypertension - Controlled on current regimen of Norvasc 10 mg daily and as necessary hydralazine. Lisinopril held.  Hyperlipidemia  -  Continue Zocor and fenofibrate.  Osteoporosis  - Continue Fosamax and vitamin D.   DVT prophylaxis: Heparin. Per orthopedics postop. Code Status: Full Family Communication: Updated patient no family at bedside. Disposition Plan: Likely SNF when ok with orthopedics.   Consultants:   Osteopenic: Dr. Erlinda Hong 03/30/2017  Orthopedics/hand surgery Dr. Caralyn Guile 03/29/2017  ENT Dr. Benjamine Mola 03/29/2017  Cardiology: Dr. Debara Pickett 03/29/2017  Procedures:   CT head CT maxillofacial 03/28/2017  Chest x-ray 03/28/2017  Plain films left knee 03/28/2017  Plain films Bilateral wrist 03/28/2017  2-D echo 03/29/2017 Open treatment of right wrist intra-articular distal radius fracture 3 more fragments--Dr.Ortmann 03/29/2017  Open treatment of left wrist intra-articluar distal radius fracture 3 or more fragments--Dr. Caralyn Guile 03/29/2017     Antimicrobials:   IV Ancef 03/28/2017     Subjective: Patient sitting up in bed. Denies shortness of breath. No chest pain.  Objective: Vitals:   03/30/17 1500 03/30/17 2027 03/31/17 0450 03/31/17 1135  BP: (!) 141/51 (!) 141/55 (!) 143/56 (!) 127/58  Pulse: 73 81 79   Resp: 16 18 18    Temp: 99 F (37.2 C) (!) 100.6 F (38.1 C) 98.8 F (37.1 C)   TempSrc: Axillary Oral Oral   SpO2: 95% 91% 92%   Weight:      Height:        Intake/Output Summary (Last 24 hours) at 03/31/17 1217 Last data filed at 03/31/17 0900  Gross per 24 hour  Intake             3350 ml  Output                0 ml  Net  3350 ml   Filed Weights   03/28/17 1512 03/28/17 2213  Weight: 102.1 kg (225 lb) 102 kg (224 lb 15.7 oz)    Examination:  General exam: Appears calm and comfortable. Respiratory system: Clear to auscultation anterior lung fields. Respiratory effort normal. Cardiovascular system: S1 & S2 heard, RRR. No JVD, murmurs, rubs, gallops or clicks. No pedal edema. Gastrointestinal system: Abdomen is nondistended, soft and nontender. No  organomegaly or masses felt. Normal bowel sounds heard. Central nervous system: Alert and oriented. No focal neurological deficits. Extremities: Bilateral upper extremity splints. Left lower extremity immobilizer.  Skin: No rashes, lesions or ulcers Psychiatry: Judgement and insight appear normal. Mood & affect appropriate.     Data Reviewed: I have personally reviewed following labs and imaging studies  CBC:  Recent Labs Lab 03/28/17 1645 03/29/17 0623 03/31/17 0846  WBC 13.5* 15.7* 11.7*  NEUTROABS 11.6*  --   --   HGB 13.5 12.1 9.8*  HCT 40.7 37.6 31.2*  MCV 92.5 93.8 98.4  PLT 192 184 409   Basic Metabolic Panel:  Recent Labs Lab 03/28/17 1645 03/29/17 0623 03/29/17 2012 03/30/17 1500 03/31/17 0846  NA 135 142 141 138 138  K 5.1 4.6 5.3* 3.8 3.8  CL 98* 103 108 102 103  CO2 23 25 22 27 29   GLUCOSE 546* 521* 293* 226* 219*  BUN 44* 39* 38* 22* 21*  CREATININE 1.70* 1.38* 1.17* 0.88 0.84  CALCIUM 9.7 9.1 8.7* 8.2* 7.9*   GFR: Estimated Creatinine Clearance: 70 mL/min (by C-G formula based on SCr of 0.84 mg/dL). Liver Function Tests: No results for input(s): AST, ALT, ALKPHOS, BILITOT, PROT, ALBUMIN in the last 168 hours. No results for input(s): LIPASE, AMYLASE in the last 168 hours. No results for input(s): AMMONIA in the last 168 hours. Coagulation Profile:  Recent Labs Lab 03/28/17 2034  INR 0.99   Cardiac Enzymes:  Recent Labs Lab 03/28/17 2034 03/29/17 0132 03/29/17 0623 03/29/17 0822 03/29/17 1528  CKTOTAL  --   --   --  207  --   CKMB  --   --   --  6.5*  --   TROPONINI 0.03* 0.08* 0.21*  --  0.17*   BNP (last 3 results) No results for input(s): PROBNP in the last 8760 hours. HbA1C: No results for input(s): HGBA1C in the last 72 hours. CBG:  Recent Labs Lab 03/30/17 1628 03/30/17 2101 03/31/17 0622 03/31/17 1142 03/31/17 1146  GLUCAP 208* 207* 185* 217* 146*   Lipid Profile: No results for input(s): CHOL, HDL, LDLCALC, TRIG,  CHOLHDL, LDLDIRECT in the last 72 hours. Thyroid Function Tests: No results for input(s): TSH, T4TOTAL, FREET4, T3FREE, THYROIDAB in the last 72 hours. Anemia Panel: No results for input(s): VITAMINB12, FOLATE, FERRITIN, TIBC, IRON, RETICCTPCT in the last 72 hours. Sepsis Labs: No results for input(s): PROCALCITON, LATICACIDVEN in the last 168 hours.  Recent Results (from the past 240 hour(s))  Surgical PCR screen     Status: None   Collection Time: 03/28/17 11:54 PM  Result Value Ref Range Status   MRSA, PCR NEGATIVE NEGATIVE Final   Staphylococcus aureus NEGATIVE NEGATIVE Final    Comment:        The Xpert SA Assay (FDA approved for NASAL specimens in patients over 16 years of age), is one component of a comprehensive surveillance program.  Test performance has been validated by [facility] for patients greater than or equal to 60 year old. It is not intended to diagnose infection nor  to guide or monitor treatment.          Radiology Studies: No results found.      Scheduled Meds: . amLODipine  10 mg Oral Daily  . aspirin  325 mg Oral Daily  . atorvastatin  20 mg Oral Daily  . cholecalciferol  2,000 Units Oral Daily  . dextromethorphan-guaiFENesin  2 tablet Oral BID  . fenofibrate  54 mg Oral Daily  . heparin subcutaneous  5,000 Units Subcutaneous Q8H  . insulin aspart  0-20 Units Subcutaneous TID WC  . insulin aspart  0-5 Units Subcutaneous QHS  . insulin aspart  5 Units Subcutaneous TID WC  . insulin detemir  35 Units Subcutaneous QHS  . midazolam  1 mg Intravenous Once  . vitamin C  1,000 mg Oral Daily   Continuous Infusions: . sodium chloride 75 mL/hr at 03/28/17 2333     LOS: 3 days    Time spent: 77 mins    Edelmira Gallogly, MD [facility] Pager [phone]  If 7PM-7AM, please contact night-coverage [email or web address] Password [email or web address] 03/31/2017, 12:17 PM

## 2017-04-01 ENCOUNTER — Encounter (HOSPITAL_COMMUNITY): Payer: Self-pay | Admitting: Orthopaedic Surgery

## 2017-04-01 DIAGNOSIS — E1122 Type 2 diabetes mellitus with diabetic chronic kidney disease: Secondary | ICD-10-CM

## 2017-04-01 DIAGNOSIS — E1165 Type 2 diabetes mellitus with hyperglycemia: Secondary | ICD-10-CM

## 2017-04-01 DIAGNOSIS — S82042A Displaced comminuted fracture of left patella, initial encounter for closed fracture: Secondary | ICD-10-CM

## 2017-04-01 DIAGNOSIS — Z794 Long term (current) use of insulin: Secondary | ICD-10-CM

## 2017-04-01 LAB — BASIC METABOLIC PANEL
Anion gap: 10 (ref 5–15)
BUN: 17 mg/dL (ref 6–20)
CO2: 23 mmol/L (ref 22–32)
CREATININE: 0.82 mg/dL (ref 0.44–1.00)
Calcium: 7.8 mg/dL — ABNORMAL LOW (ref 8.9–10.3)
Chloride: 104 mmol/L (ref 101–111)
GFR calc Af Amer: >60 mL/min (ref >60)
Glucose, Bld: 241 mg/dL — ABNORMAL HIGH (ref 65–99)
Potassium: 4.3 mmol/L (ref 3.5–5.1)
SODIUM: 137 mmol/L (ref 135–145)

## 2017-04-01 LAB — CBC WITH DIFFERENTIAL/PLATELET
Basophils Absolute: 0 10*3/uL (ref 0.0–0.1)
Basophils Relative: 0 %
Eosinophils Absolute: 0 10*3/uL (ref 0.0–0.7)
Eosinophils Relative: 0 %
HCT: 32.2 % — ABNORMAL LOW (ref 36.0–46.0)
Hemoglobin: 9.9 g/dL — ABNORMAL LOW (ref 12.0–15.0)
Lymphocytes Relative: 8 %
Lymphs Abs: 1 10*3/uL (ref 0.7–4.0)
MCH: 29.9 pg (ref 26.0–34.0)
MCHC: 30.7 g/dL (ref 30.0–36.0)
MCV: 97.3 fL (ref 78.0–100.0)
Monocytes Absolute: 1.4 10*3/uL — ABNORMAL HIGH (ref 0.1–1.0)
Monocytes Relative: 11 %
Neutro Abs: 10.3 10*3/uL — ABNORMAL HIGH (ref 1.7–7.7)
Neutrophils Relative %: 81 %
Platelets: 165 10*3/uL (ref 150–400)
RBC: 3.31 MIL/uL — ABNORMAL LOW (ref 3.87–5.11)
RDW: 13.1 % (ref 11.5–15.5)
WBC: 12.6 10*3/uL — ABNORMAL HIGH (ref 4.0–10.5)

## 2017-04-01 LAB — GLUCOSE, CAPILLARY
GLUCOSE-CAPILLARY: 225 mg/dL — AB (ref 65–99)
GLUCOSE-CAPILLARY: 191 mg/dL — AB (ref 65–99)
Glucose-Capillary: 144 mg/dL — ABNORMAL HIGH (ref 65–99)
Glucose-Capillary: 224 mg/dL — ABNORMAL HIGH (ref 65–99)

## 2017-04-01 MED ORDER — ENSURE ENLIVE PO LIQD: 237.0000 mL | Freq: Two times a day (BID) | ORAL | Status: DC

## 2017-04-01 MED ORDER — ORAL CARE MOUTH RINSE
15.0000 mL | Freq: Two times a day (BID) | OROMUCOSAL | Status: DC
  Administered 2017-04-02 – 2017-04-05 (×6): 15 mL via OROMUCOSAL

## 2017-04-01 NOTE — Progress Notes (Signed)
Orthopedic Tech Progress Note Patient Details:  Christy [name] 10-17-1941 [id number]  Patient ID: Christy [name], female   DOB: July 07, 1941, 76 y.o.   MRN: [id number]   [name] 04/01/2017, 9:28 AM Called in bio-tech brace order; spoke with Christy [name]

## 2017-04-01 NOTE — Progress Notes (Signed)
Orthopedic Tech Progress Note Patient Details:  Christy [name] Aug 16, 1941 [id number] Brace completed by bio-tech  Patient ID: [name], female   DOB: 04/20/1941, 76 y.o.   MRN: [id number]   Braulio Bosch 04/01/2017, 5:32 PM

## 2017-04-01 NOTE — Consult Note (Signed)
Physical Medicine and Rehabilitation Consult Reason for Consult: Decreased functional mobility Referring Physician: Internal medicine   HPI: Christy [name] is a 76 y.o. right handed Bosnian speaking female history of hypertension diabetes mellitus and breast cancer. Per chart review lives with son.  Independent with assistive device using a cane prior to admission. Independent with ADLs. Question 24/7  assist on discharge. Presented 03/28/2017 after a fall while leaving church. Denied loss of consciousness. CT of the head without acute intracranial abnormalities. CT maxillofacial showed multiple fractures involving the maxillary antra bilaterally as well as the pterygoid plates bilaterally. X-ray imaging showed bilateral distal radius fractures as well as patella fracture. Underwent open treatment of right wrist distal radius fracture with brachial radialis tendon tenotomy as well as closed treatment of left distal ulnar fracture requiring manipulation and brachialis radialis tendon tenotomy 03/29/2017 per Dr. Apolonio Schneiders. Underwent left partial patella ectomy and patellar tendon advancement 03/31/2017 per Dr.XU. Nonweightbearing bilateral wrists. Can bear weight at the elbow using platform walker. Weightbearing as tolerated left lower extremity. Maintained in knee immobilizer as well as short arm splints to the right upper extremity. Hospital course pain management. Acute blood loss anemia 9.9 and monitored. Subcutaneous heparin for DVT prophylaxis. Physical therapy evaluation completed. M.D. has requested physical medicine rehabilitation consult.   Review of Systems  Constitutional: Negative for chills and fever.  HENT: Negative for hearing loss.   Eyes: Negative for blurred vision and double vision.  Respiratory: Negative for cough and shortness of breath.   Cardiovascular: Negative for chest pain, palpitations and leg swelling.  Gastrointestinal: Positive for constipation. Negative for nausea  and vomiting.  Genitourinary: Negative for dysuria, flank pain and hematuria.       Overactive bladder  Musculoskeletal: Positive for falls.  Skin: Negative for rash.  All other systems reviewed and are negative.  Past Medical History:  Diagnosis Date  . Abnormal ECG    Inferior Q waves  . Cancer ([place])    stage II right breast cancer  . Cataract   . Diabetes mellitus   . Hypercholesterolemia   . Hyperlipidemia   . Hypertension   . Osteoporosis   . Overactive bladder    Past Surgical History:  Procedure Laterality Date  . BREAST SURGERY     right lumpectomy, sentinel node biopsy  . CATARACT EXTRACTION Bilateral 2008  . FRACTURE SURGERY     ORIF right distal radius fx  . JOINT REPLACEMENT     right hip  . PATELLECTOMY Left 03/31/2017   Procedure: LEFT PARTIAL PATELLECTOMY;  Surgeon: Leandrew Koyanagi, MD;  Location: [facility];  Service: Orthopedics;  Laterality: Left;  . PORT-A-CATH REMOVAL  09/11/2011   Procedure: REMOVAL PORT-A-CATH;  Surgeon: Rolm Bookbinder, MD;  Location: [facility];  Service: General;  Laterality: Left;  local   . PORTACATH PLACEMENT     Family History  Problem Relation Age of Onset  . Stroke Mother   . Stroke Father   . Hypertension Sister   . Stroke Sister   . Stroke Brother    Social History:  reports that she quit smoking about 4 years ago. Her smoking use included Cigarettes. She has never used smokeless tobacco. She reports that she does not drink alcohol or use drugs. Allergies:  Allergies  Allergen Reactions  . No Known Allergies    Medications Prior to Admission  Medication Sig Dispense Refill  . alendronate (FOSAMAX) 70 MG tablet Take 1 tablet (70 mg total) by mouth  every 7 (seven) days. Take with a full glass of water on an empty stomach. (Patient taking differently: Take 70 mg by mouth every Monday. Take with a full glass of water on an empty stomach.) 12 tablet 8  . amLODipine (NORVASC) 5 MG tablet Take 1 tablet (5 mg  total) by mouth daily. 30 tablet 1  . Cholecalciferol (VITAMIN D) 2000 units tablet Take 2,000 Units by mouth daily.    . fenofibrate (TRICOR) 48 MG tablet Take 1 tablet (48 mg total) by mouth daily. 30 tablet 1  . furosemide (LASIX) 40 MG tablet Take 1 tablet (40 mg total) by mouth daily. 30 tablet 1  . ibuprofen (ADVIL,MOTRIN) 800 MG tablet Take 800 mg by mouth every 8 (eight) hours as needed for fever or moderate pain.     [name] insulin aspart (NOVOLOG) 100 UNIT/ML injection Inject 30 Units into the skin 2 (two) times daily with a meal.    . insulin detemir (LEVEMIR) 100 UNIT/ML injection Inject 0.2 mLs (20 Units total) into the skin at bedtime. (Patient taking differently: Inject 26 Units into the skin at bedtime. ) 10 mL 2  . lisinopril (PRINIVIL,ZESTRIL) 40 MG tablet Take 1 tablet (40 mg total) by mouth daily. 30 tablet 1  . metFORMIN (GLUCOPHAGE-XR) 500 MG 24 hr tablet Take 2 tablets (1,000 mg total) by mouth 2 (two) times daily. 180 tablet 1  . Omega-3 Fatty Acids (FISH OIL) 1200 MG CAPS Take 1,200 mg by mouth 2 (two) times daily.    . simvastatin (ZOCOR) 40 MG tablet TAKE 1 TABLET BY MOUTH ONCE DAILY 30 tablet 0  . solifenacin (VESICARE) 10 MG tablet Take 1 tablet (10 mg total) by mouth daily. 30 tablet 1  . furosemide (LASIX) 20 MG tablet Take 20 mg in the morning, in addition to your nightly dose. 20 mg twice a day for 5 days, then resume 20 daily (Patient not taking: Reported on 03/28/2017) 5 tablet 0  . glimepiride (AMARYL) 2 MG tablet Take 1 tablet (2 mg total) by mouth daily before breakfast. (Patient not taking: Reported on 03/28/2017) 30 tablet 1  . potassium chloride (K-DUR) 10 MEQ tablet Take 1 tablet (10 mEq total) by mouth 2 (two) times daily. (Patient not taking: Reported on 08/03/2016) 30 tablet 0    Home: [facility] expects to be discharged to:: Skilled nursing facility Living Arrangements: Children (son) Type of Home: Apartment (moving june 1 to new apt on  ground floor) Home Access: Level entry Bathroom Shower/Tub: Tub/shower unit Home Equipment: Environmental consultant - 2 wheels, [employer] - single point, Bedside commode, Shower seat  Functional History: Prior Function Level of Independence: Independent with assistive device(s) Comments: Walking weith cane PTA, Indpeendent in ADLs and lignt meals prep Functional Status:  Mobility: Bed Mobility Overal bed mobility: Needs Assistance Bed Mobility: Supine to Sit, Sit to Supine Supine to sit: Max assist, Total assist, +2 for physical assistance, HOB elevated Sit to supine: Total assist, +2 for physical assistance General bed mobility comments: Pt requires A to elevate trunk and transition hips to EOB with use of pad.  Transfers General transfer comment: Attempted sit to stand x 3 with pt appearing to be too fearful to try.  She kept saying, "I can't do it.  I can't stand."  Pt would not even try.  Even with use of pad, gait belt and max to total assist, pt would not clear buttocks off bed.        ADL: ADL Overall ADL's :  Needs assistance/impaired General ADL Comments: Max A for ADLs due to pain and immobilization  Cognition: Cognition Overall Cognitive Status: Difficult to assess Orientation Level: Oriented X4, Other (comment) (speaks [place]) Cognition Arousal/Alertness: Awake/alert Behavior During Therapy: WFL for tasks assessed/performed Overall Cognitive Status: Difficult to assess General Comments: Appears WFL as interpreter # [phone] used and pt appears cognitively intact Difficult to assess due to: Non-English speaking  Blood pressure (!) 110/44, pulse 70, temperature 99.2 F (37.3 C), temperature source Axillary, resp. rate 18, height 5\' 7"  (1.702 m), weight 102 kg (224 lb 15.7 oz), SpO2 94 %. Physical Exam  Constitutional: She appears well-developed.  HENT:  Multiple bruises to the face  Eyes: EOM are normal.  Neck: Normal range of motion. Neck supple. No thyromegaly present.    Cardiovascular: Normal rate and regular rhythm.   Respiratory: Effort normal and breath sounds normal. No respiratory distress.  GI: Soft. Bowel sounds are normal. She exhibits no distension.  Neurological: She is alert.  Limited English-speaking with language barrier. Follows simple demonstrated commands. Limited movement of arms due to splints/pain. LLE also limited. Wiggles toes and fingers.   Skin:  Knee immobilizer and long-arm splint in place    Results for orders placed or performed during the hospital encounter of 03/28/17 (from the past 24 hour(s))  Glucose, capillary     Status: Abnormal   Collection Time: 03/31/17  3:05 PM  Result Value Ref Range   Glucose-Capillary 195 (H) 65 - 99 mg/dL  Glucose, capillary     Status: Abnormal   Collection Time: 03/31/17  6:21 PM  Result Value Ref Range   Glucose-Capillary 195 (H) 65 - 99 mg/dL   Comment 1 Notify RN   Glucose, capillary     Status: Abnormal   Collection Time: 03/31/17  9:31 PM  Result Value Ref Range   Glucose-Capillary 234 (H) 65 - 99 mg/dL  CBC with Differential/Platelet     Status: Abnormal   Collection Time: 04/01/17  5:56 AM  Result Value Ref Range   WBC 12.6 (H) 4.0 - 10.5 K/uL   RBC 3.31 (L) 3.87 - 5.11 MIL/uL   Hemoglobin 9.9 (L) 12.0 - 15.0 g/dL   HCT 32.2 (L) 36.0 - 46.0 %   MCV 97.3 78.0 - 100.0 fL   MCH 29.9 26.0 - 34.0 pg   MCHC 30.7 30.0 - 36.0 g/dL   RDW 13.1 11.5 - 15.5 %   Platelets 165 150 - 400 K/uL   Neutrophils Relative % 81 %   Neutro Abs 10.3 (H) 1.7 - 7.7 K/uL   Lymphocytes Relative 8 %   Lymphs Abs 1.0 0.7 - 4.0 K/uL   Monocytes Relative 11 %   Monocytes Absolute 1.4 (H) 0.1 - 1.0 K/uL   Eosinophils Relative 0 %   Eosinophils Absolute 0.0 0.0 - 0.7 K/uL   Basophils Relative 0 %   Basophils Absolute 0.0 0.0 - 0.1 K/uL  Basic metabolic panel     Status: Abnormal   Collection Time: 04/01/17  5:56 AM  Result Value Ref Range   Sodium 137 135 - 145 mmol/L   Potassium 4.3 3.5 - 5.1  mmol/L   Chloride 104 101 - 111 mmol/L   CO2 23 22 - 32 mmol/L   Glucose, Bld 241 (H) 65 - 99 mg/dL   BUN 17 6 - 20 mg/dL   Creatinine, Ser 0.82 0.44 - 1.00 mg/dL   Calcium 7.8 (L) 8.9 - 10.3 mg/dL   GFR calc non Af Amer >  60 >60 mL/min   GFR calc Af Amer >60 >60 mL/min   Anion gap 10 5 - 15  Glucose, capillary     Status: Abnormal   Collection Time: 04/01/17  6:53 AM  Result Value Ref Range   Glucose-Capillary 225 (H) 65 - 99 mg/dL  Glucose, capillary     Status: Abnormal   Collection Time: 04/01/17 11:35 AM  Result Value Ref Range   Glucose-Capillary 191 (H) 65 - 99 mg/dL   No results found.  Assessment/Plan: Diagnosis: 59 female with multiple ortho trauma 1. Does the need for close, 24 hr/day medical supervision in concert with the patient's rehab needs make it unreasonable for this patient to be served in a less intensive setting? Potentially 2. Co-Morbidities requiring supervision/potential complications:  Pain, wound care 3. Due to bladder management, bowel management, safety, skin/wound care, disease management and pain management, does the patient require 24 hr/day rehab nursing? Potentially 4. Does the patient require coordinated care of a physician, rehab nurse, PT (1-2 hrs/day, 5 days/week) and OT (1-2 hrs/day, 5 days/week) to address physical and functional deficits in the context of the above medical diagnosis(es)? Potentially Addressing deficits in the following areas: balance, endurance, locomotion, strength, transferring, bowel/bladder control, bathing, dressing, feeding, grooming and toileting 5. Can the patient actively participate in an intensive therapy program of at least 3 hrs of therapy per day at least 5 days per week? No 6. The potential for patient to make measurable gains while on inpatient rehab is fair and poor 7. Anticipated functional outcomes upon discharge from inpatient rehab are n/a  with PT, n/a with OT, n/a with SLP. 8. Estimated rehab length of stay  to reach the above functional goals is: n/a 9. Anticipated D/C setting: Other 10. Anticipated post D/C treatments: [place] therapy 11. Overall Rehab/Functional Prognosis: excellent  RECOMMENDATIONS: This patient's condition is appropriate for continued rehabilitative care in the following setting: SNF Patient has agreed to participate in recommended program. No Note that insurance prior authorization may be required for reimbursement for recommended care.  Comment:   Meredith Staggers, MD, [facility] 04/02/2017    Cathlyn Parsons., PA-C 04/01/2017

## 2017-04-01 NOTE — Progress Notes (Signed)
   Subjective:  Patient reports pain as mild.    Objective:   VITALS:   Vitals:   03/31/17 1950 03/31/17 2102 03/31/17 2300 04/01/17 0444  BP:  (!) 191/46 (!) 123/40 (!) 121/36  Pulse:  81 87 82  Resp:  18 18 18   Temp: 99 F (37.2 C) 99.1 F (37.3 C) 98.9 F (37.2 C) 98.5 F (36.9 C)  TempSrc:  Oral Oral Oral  SpO2:  94% 94% 97%  Weight:      Height:        Neurologically intact Neurovascular intact Sensation intact distally Intact pulses distally Dorsiflexion/Plantar flexion intact Incision: dressing C/D/I and no drainage No cellulitis present Compartment soft   Lab Results  Component Value Date   WBC 12.6 (H) 04/01/2017   HGB 9.9 (L) 04/01/2017   HCT 32.2 (L) 04/01/2017   MCV 97.3 04/01/2017   PLT 165 04/01/2017     Assessment/Plan:  1 Day Post-Op   - Expected postop acute blood loss anemia - will monitor for symptoms - Up with PT/OT - DVT ppx - SCDs, ambulation - WBAT LLE in bledsoe brace or KI - Pain control - will likely need SNF  Eduard Roux 04/01/2017, 7:28 AM [phone]

## 2017-04-01 NOTE — Progress Notes (Addendum)
Physical Therapy Treatment Patient Details Name: Christy [name] MRN: [id number] DOB: 1941/07/24 Today's Date: 04/01/2017    History of Present Illness 76 y.o. Bosnian only speaking female with medical history significant of HTN, DM type II, osteoporosis, breast cancer; who present after having a fall. Patient was leaving church with her son when she states her left leg went out and she fell face forward onto the side of the road and sustained bilateral distal radius, patella, and facial fractures. Underwent bilateral wrist surgery on 03/29/16.     PT Comments    Pt admitted with above diagnosis. Pt currently with functional limitations due to balance and endurance deficits. Pt was only able to sit EOB today.  Could not stand even with 3 attempts and use of pad.  Pt fearful and was in pain even though she was premedicated and she could not even clear bottom off bed. Will continue PT as pt tolerates.  Very limited currently due to NWB bil UES and left LE with knee immobilizer. Did note in OT order after therapy completed today that MD will allow weight on elbows with PFRW. Will try tomorrow.Pt will benefit from skilled PT to increase their independence and safety with mobility to allow discharge to the venue listed below.     Follow Up Recommendations  SNF;Supervision/Assistance - 24 hour     Equipment Recommendations  Other (comment) (TBA)    Recommendations for Other Services       Precautions / Restrictions Precautions Precautions: Fall Required Braces or Orthoses: Knee Immobilizer - Left;Other Brace/Splint (Awaiting Bledsoe brace) Restrictions Weight Bearing Restrictions: Yes (NWB) RUE Weight Bearing: Non weight bearing wrists, can weight bear on elbows for platform LUE Weight Bearing: Non weight bearing, can weight bear on elbows for platform LLE Weight Bearing: Weight bearing as tolerated    Mobility  Bed Mobility Overal bed mobility: Needs Assistance Bed Mobility: Supine to  Sit;Sit to Supine     Supine to sit: Max assist;Total assist;+2 for physical assistance;HOB elevated Sit to supine: Total assist;+2 for physical assistance   General bed mobility comments: Pt requires A to elevate trunk and transition hips to EOB with use of pad.   Transfers                 General transfer comment: Attempted sit to stand x 3 with pt appearing to be too fearful to try.  She kept saying, "I can't do it.  I can't stand."  Pt would not even try.  Even with use of pad, gait belt and max to total assist, pt would not clear buttocks off bed.    Ambulation/Gait                 Stairs            Wheelchair Mobility    Modified Rankin (Stroke Patients Only)       Balance Overall balance assessment: Needs assistance Sitting-balance support: No upper extremity supported (only right foot on floor) Sitting balance-Leahy Scale: Fair Sitting balance - Comments: Pt sat EOB 20 min with min assist to supervision. Took pt incr time to get her balance intially and she was losing it as she fatigued.   Postural control: Posterior lean;Left lateral lean                                  Cognition Arousal/Alertness: Awake/alert Behavior During Therapy: WFL for tasks assessed/performed Overall Cognitive  Status: Difficult to assess                                 General Comments: Appears Christy [name] as interpreter # [phone] used and pt appears cognitively intact      Exercises General Exercises - Lower Extremity Long Arc Quad: AROM;5 reps;Seated    General Comments General comments (skin integrity, edema, etc.): Telephone interpreter used # [id number].       Pertinent Vitals/Pain Pain Assessment: Faces Faces Pain Scale: Hurts even more Pain Location: Both hands and L knee Pain Descriptors / Indicators: Constant;Discomfort;Grimacing;Guarding;Moaning Pain Intervention(s): Limited activity within patient's tolerance;Monitored during  session;Premedicated before session;Repositioned    Home Living                      Prior Function            PT Goals (current goals can now be found in the care plan section) Progress towards PT goals: Not progressing toward goals - comment (pt in pain and refusing to try to stand on left LE.)    Frequency    Min 6X/week      PT Plan Current plan remains appropriate    Co-evaluation              AM-PAC PT "6 Clicks" Daily Activity  Outcome Measure  Difficulty turning over in bed (including adjusting bedclothes, sheets and blankets)?: Total Difficulty moving from lying on back to sitting on the side of the bed? : Total Difficulty sitting down on and standing up from a chair with arms (e.g., wheelchair, bedside commode, etc,.)?: Total Help needed moving to and from a bed to chair (including a wheelchair)?: Total Help needed walking in hospital room?: Total Help needed climbing 3-5 steps with a railing? : Total 6 Click Score: 6    End of Session Equipment Utilized During Treatment: Gait belt Activity Tolerance: Patient limited by fatigue;Patient limited by pain Patient left: in bed;with call bell/phone within reach;with bed alarm set;with SCD's reapplied Nurse Communication: Mobility status;Need for lift equipment PT Visit Diagnosis: Muscle weakness (generalized) (M62.81);Pain;Other abnormalities of gait and mobility (R26.89) Pain - Right/Left:  (bil UEs, left LE) Pain - part of body: Hand;Leg;Knee     Time: 3382-5053 PT Time Calculation (min) (ACUTE ONLY): 39 min  Charges:  $Therapeutic Exercise: 8-22 mins $Therapeutic Activity: 23-37 mins                    G Codes:       Sylar Voong,PT Acute Rehabilitation [phone] (pager)    Denice Paradise 04/01/2017, 1:24 PM

## 2017-04-01 NOTE — NC FL2 (Signed)
[place] LEVEL OF CARE SCREENING TOOL     IDENTIFICATION  Patient Name: Christy [name] Birthdate: [birth year] Sex: female Admission Date (Current Location): 03/28/2017  [facility] and [place] Number:  Herbalist and Address:  [facility], [place] [address] [place],  [address]      Provider Number: [id number]  Attending Physician Name and Address:  Alveda Reasons, MD  Relative Name and Phone Number:       Current Level of Care: Hospital Recommended Level of Care: [place] Prior Approval Number:    Date Approved/Denied: 04/01/17 PASRR Number: [id number]  Discharge Plan: SNF    Current Diagnoses: Patient Active Problem List   Diagnosis Date Noted  . Displaced comminuted fracture of left patella, initial encounter for closed fracture 03/31/2017  . Elevated troponin   . Acute renal failure (ARF) ([place]) 03/29/2017  . Leukocytosis 03/29/2017  . Fall 03/29/2017  . Multiple fractures 03/28/2017  . Hyperlipidemia 12/27/2016  . Essential hypertension 12/27/2016  . Elevated serum creatinine 08/07/2014  . Elevated BUN 08/07/2014  . Uncontrolled type II diabetes mellitus ([place]) 08/07/2014  . Osteoporosis 08/07/2014  . Breast cancer of upper-outer quadrant of right female breast ([place]) 08/07/2013  . Abnormal EKG 08/21/2011    Orientation RESPIRATION BLADDER Height & Weight     Self, Time, Situation, Place  O2 (Nasal Cannula 3L) External catheter, Incontinent Weight: 224 lb 15.7 oz (102 kg) Height:  5\' 7"  (170.2 cm)  BEHAVIORAL SYMPTOMS/MOOD NEUROLOGICAL BOWEL NUTRITION STATUS      Continent Diet (See DC Summary)  AMBULATORY STATUS COMMUNICATION OF NEEDS Skin   Extensive Assist Verbally Surgical wounds (Right Arm closed Incision with compression; Left arm closed Incision with compression)                       Personal Care Assistance Level of Assistance  Bathing, Feeding, Dressing Bathing Assistance:  Maximum assistance Feeding assistance: Maximum assistance Dressing Assistance: Maximum assistance     Functional Limitations Info  Sight, Hearing, Speech Sight Info: Adequate Hearing Info: Adequate Speech Info: Adequate    SPECIAL CARE FACTORS FREQUENCY  PT (By licensed PT), OT (By licensed OT)     PT Frequency: 5xweek OT Frequency: 5xweek            Contractures      Additional Factors Info  Code Status, Allergies, Insulin Sliding Scale Code Status Info: Full Allergies Info: NKA   Insulin Sliding Scale Info: 20 units 3x's a day; 5 units 3x's a day; 5 units at bedtime       Current Medications (04/01/2017):  This is the current hospital active medication list Current Facility-Administered Medications  Medication Dose Route Frequency Provider Last Rate Last Dose  . amLODipine (NORVASC) tablet 10 mg  10 mg Oral Daily Elgergawy, Silver Huguenin, MD   10 mg at 04/01/17 0900  . aspirin tablet 325 mg  325 mg Oral Daily Elgergawy, Silver Huguenin, MD   325 mg at 04/01/17 0900  . atorvastatin (LIPITOR) tablet 20 mg  20 mg Oral Daily Elgergawy, Silver Huguenin, MD   20 mg at 04/01/17 0900  . cholecalciferol (VITAMIN D) tablet 2,000 Units  2,000 Units Oral Daily Elgergawy, Silver Huguenin, MD   2,000 Units at 04/01/17 0900  . dextromethorphan-guaiFENesin (MUCINEX DM) 30-600 MG per 12 hr tablet 2 tablet  2 tablet Oral BID Elgergawy, Silver Huguenin, MD   2 tablet at 04/01/17 1248  . dextrose 50 %  solution 25 mL  25 mL Intravenous PRN Elgergawy, Silver Huguenin, MD      . fenofibrate tablet 54 mg  54 mg Oral Daily Elgergawy, Silver Huguenin, MD   54 mg at 04/01/17 0900  . furosemide (LASIX) tablet 40 mg  40 mg Oral Daily Leandrew Koyanagi, MD   40 mg at 04/01/17 0900  . heparin injection 5,000 Units  5,000 Units Subcutaneous Q8H Elgergawy, Silver Huguenin, MD   5,000 Units at 04/01/17 1603  . hydrALAZINE (APRESOLINE) injection 10 mg  10 mg Intravenous Q4H PRN Elgergawy, Silver Huguenin, MD   10 mg at 03/31/17 1848  . HYDROcodone-acetaminophen  (NORCO/VICODIN) 5-325 MG per tablet 1-2 tablet  1-2 tablet Oral Q6H PRN Norval Morton, MD   2 tablet at 04/01/17 1151  . ibuprofen (ADVIL,MOTRIN) tablet 800 mg  800 mg Oral Q8H PRN Leandrew Koyanagi, MD      . insulin aspart (novoLOG) injection 0-20 Units  0-20 Units Subcutaneous TID WC Schorr, Rhetta Mura, NP   4 Units at 04/01/17 1249  . insulin aspart (novoLOG) injection 0-5 Units  0-5 Units Subcutaneous QHS Schorr, Rhetta Mura, NP   2 Units at 03/31/17 2223  . insulin aspart (novoLOG) injection 5 Units  5 Units Subcutaneous TID WC Elgergawy, Silver Huguenin, MD   5 Units at 04/01/17 1248  . insulin detemir (LEVEMIR) injection 35 Units  35 Units Subcutaneous QHS Elgergawy, Silver Huguenin, MD   35 Units at 03/31/17 2223  . lactated ringers infusion   Intravenous Continuous Catalina Gravel, MD      . lisinopril (PRINIVIL,ZESTRIL) tablet 40 mg  40 mg Oral Daily Leandrew Koyanagi, MD   40 mg at 04/01/17 0900  . midazolam (VERSED) injection 1 mg  1 mg Intravenous Once Maryan Puls, MD      . ondansetron [facility]) injection 4 mg  4 mg Intravenous Q6H PRN Fuller Plan A, MD   4 mg at 03/31/17 1757  . promethazine (PHENERGAN) injection 12.5 mg  12.5 mg Intravenous Q6H PRN Fuller Plan A, MD   12.5 mg at 03/28/17 2333  . vitamin C (ASCORBIC ACID) tablet 1,000 mg  1,000 mg Oral Daily Iran Planas, MD   1,000 mg at 04/01/17 0900     Discharge Medications: Please see discharge summary for a list of discharge medications.  Relevant Imaging Results:  Relevant Lab Results:   Additional Information [id number]  Normajean Baxter, LCSW

## 2017-04-01 NOTE — Anesthesia Postprocedure Evaluation (Signed)
Anesthesia Post Note  Patient: Jalynn Hinchcliff  Procedure(s) Performed: Procedure(s) (LRB): LEFT PARTIAL PATELLECTOMY (Left)  Patient location during evaluation: PACU Anesthesia Type: General Level of consciousness: awake and alert Pain management: pain level controlled Vital Signs Assessment: post-procedure vital signs reviewed and stable Respiratory status: spontaneous breathing, nonlabored ventilation, respiratory function stable and patient connected to nasal cannula oxygen Cardiovascular status: blood pressure returned to baseline and stable Postop Assessment: no signs of nausea or vomiting Anesthetic complications: no       Last Vitals:  Vitals:   03/31/17 2300 04/01/17 0444  BP: (!) 123/40 (!) 121/36  Pulse: 87 82  Resp: 18 18  Temp: 37.2 C 36.9 C    Last Pain:  Vitals:   04/01/17 0605  TempSrc:   PainSc: Asleep                 Tomi Paddock S

## 2017-04-01 NOTE — Progress Notes (Signed)
Family Medicine Teaching Service Daily Progress Note Intern Pager: [phone]  Patient name: Christy [name] Medical record number: [id number] Date of birth: 1941-01-10 Age: 76 y.o. Gender: female  Primary Care Provider: [name], MD Consultants: Cardiology, Hand surgery, Orthopedics Code Status: Full   Pt Overview and Major Events to Date:    Assessment and Plan:  #Bilateral distal radius fracture, s/p repair Status postsurgical repair by Dr. Apolonio Schneiders 5/28. PT/OT recommended SNF placement with 24 hr assistance --Continue hydrocodone-acetaminophen 5-325 mg q6  #Nasal and maxillary fractures.  ENT assessed and did not find any significant deformity. Likely will not need any surgical intervention at the moment. --Follow up with ENT as an outpatient in one week .  #Left Patella fracture s/p repair on 5/30 Patient was seen by Dr.Xu (orthopedic surgeon) this morning who recommended the following: --Follow up on am CBC --Place patient on SCD --Weight-bearing as tolerated in Bledsoe brace or KI --Recommended SNF --Norco/Vicodin 5-325 mg   #Elevated troponin Likely stress related, Echo showed an EF of 60-65% with normal systolic function and normal wall motion with G1DD. Patient has been seen in consultation by cardiology who felt no further cardiac workup needed at this time with outpatient follow-up. --Continue aspirin 325 mg daily  #Diabetes mellitus, uncontrolled Last A1c 9.5 01/06/2017. Patient was supposed to be on Levemir 26 units, with 2000 mg of metformin. During this hospitalization requiring about 35 units of long acting, and meal coverage NovoLog 5 units added as well as insulin sliding scale with better control. --Continue to hold oral hypoglycemic agents --Continue resistant SSI  #ARF, resolved --Baseline creatinine 1.2, she presents with a creatinine of 1.7, and BUN of 44, improved with IV fluids. This morning creatinine is 0.82.  --Will continue to  monitor  #Hypertension, well controlled  BP 110/44 this am. --Continue Norvasc 10 mg daily --Could restart Lisinopril now that AKI has resolved  #Hyperlipidemia --Continue Zocor and Fenofibrate.  #Osteoporosis --Continue Fosamax and vitamin D  FEN/GI: Heart healthy, carb modified PPx: SCD  Disposition: patient is medically stable and ready for SNF placement vs CIR  Subjective:  Patient is doing well this morning, appears a bit tired but conversant. Talk to there through the interpreter. No family at bedside this morning.  Objective: Temp:  [98.5 F (36.9 C)-100.5 F (38.1 C)] 98.5 F (36.9 C) (05/31 0444) Pulse Rate:  [77-87] 82 (05/31 0444) Resp:  [16-21] 18 (05/31 0444) BP: (120-203)/(36-66) 121/36 (05/31 0444) SpO2:  [90 %-97 %] 97 % (05/31 0444)  Physical Exam: General: NAD, able to answer questions Cardiac: RRR, normal heart sounds, no murmurs. 2+ radial and PT pulses bilaterally Respiratory: CTAB, normal effort, No wheezes, rales or rhonchi Abdomen: soft, nontender, nondistended, no hepatic or splenomegaly, +BS Extremities: bilateral forearm wrap in ace bandage, and left leg with brace unable to perform ROM due to pain. Skin: warm and dry, no rashes noted Neuro: alert and oriented x4, no focal deficits Psych: Normal affect and mood   Laboratory:  Recent Labs Lab 03/29/17 0623 03/31/17 0846 04/01/17 0556  WBC 15.7* 11.7* 12.6*  HGB 12.1 9.8* 9.9*  HCT 37.6 31.2* 32.2*  PLT 184 151 165    Recent Labs Lab 03/29/17 2012 03/30/17 1500 03/31/17 0846  NA 141 138 138  K 5.3* 3.8 3.8  CL 108 102 103  CO2 22 27 29   BUN 38* 22* 21*  CREATININE 1.17* 0.88 0.84  CALCIUM 8.7* 8.2* 7.9*  GLUCOSE 293* 226* 219*    Imaging/Diagnostic Tests: Dg Chest  1 View  Result Date: 03/28/2017 CLINICAL DATA:  Legs gave out, patient fell, deformities to LEFT and RIGHT wrists, laceration to bridge of nose, LEFT knee abrasion EXAM: CHEST 1 VIEW COMPARISON:  07/02/2010  FINDINGS: Enlargement of cardiac silhouette with slight vascular congestion. Mediastinal contours normal. Interval removal of Port-A-Cath. Atherosclerotic calcification aorta. Mild bibasilar atelectasis versus infiltrate. Upper lungs clear. No pleural effusion or pneumothorax. Bones demineralized. IMPRESSION: Enlargement of cardiac silhouette with pulmonary vascular congestion. Mild bibasilar atelectasis versus infiltrate. Electronically Signed   By: Lavonia Dana M.D.   On: 03/28/2017 18:27   Dg Wrist Complete Left  Result Date: 03/28/2017 CLINICAL DATA:  76 year old female with history of trauma from a fall complaining of pain in the left wrist. EXAM: LEFT WRIST - COMPLETE 3+ VIEW COMPARISON:  Left hand radiograph 06/13/2009. FINDINGS: Highly comminuted fractures of the distal radius and ulna are noted. The radial fracture is highly comminuted, intra-articular, impacted and displaced, with extension of the fracture through the metaphyseal region into the distal diaphyseal region. Individual fracture fragments are displaced in a random distribution, generally extending outward from the distal radius. There is some very mild volar angulation on the cross-table lateral view (approximately 15-20 degrees). Distal ulnar fracture is also highly comminuted, with radial displacement of the distal ulna approximately 1 to 1-1/2 shaft width. There also appears to be some distal migration of the proximal ulnar fragment into the overlying soft tissues which are markedly swollen. The carpal some cells appear intact. IMPRESSION: 1. Highly comminuted, displaced and angulated intra-articular fractures of the distal radius and ulna, as detailed above. Electronically Signed   By: Vinnie Langton M.D.   On: 03/28/2017 18:27   Dg Wrist Complete Right  Result Date: 03/28/2017 CLINICAL DATA:  Recent fall with wrist pain, initial encounter EXAM: RIGHT WRIST - COMPLETE 3+ VIEW COMPARISON:  06/11/2009 FINDINGS: Fixation sideplate is  noted along the distal radius in an area of prior fracture. A new fracture is noted in the radial metaphysis with posterior angulation. Additionally there is fracture of the fixation sideplate distally at the area of fracture. Deformity of the distal ulna is noted related to prior fracture and healing. No other are seen. IMPRESSION: Changes consistent with previous healed radial and ulnar fractures with surgical fixation. New radial metaphyseal fracture with posterior angulation and fracture of the fixation sideplate. Electronically Signed   By: Inez Catalina M.D.   On: 03/28/2017 18:26   Ct Head Wo Contrast  Result Date: 03/28/2017 CLINICAL DATA:  Recent fall with facial injury and headaches, initial encounter EXAM: CT HEAD WITHOUT CONTRAST CT MAXILLOFACIAL WITHOUT CONTRAST TECHNIQUE: Multidetector CT imaging of the head and maxillofacial structures were performed using the standard protocol without intravenous contrast. Multiplanar CT image reconstructions of the maxillofacial structures were also generated. COMPARISON:  06/11/2009 FINDINGS: CT HEAD FINDINGS Brain: Diffuse atrophic changes are noted. No findings to suggest acute hemorrhage, acute infarction or space-occupying mass lesion are seen. Areas of chronic white matter ischemic change are noted stable from the prior exam. Vascular: No hyperdense vessel or unexpected calcification. Skull: The skull is intact. Multiple fractures are noted involving the nasal bones as well as the maxillary antra bilaterally. These will be better evaluated on the maxillofacial CT. Other: Air-fluid levels are noted in the maxillary antra bilaterally. CT MAXILLOFACIAL FINDINGS Osseous: Bilateral nasal bone fractures are noted with mild impaction. Fractures of the pterygoid plates are noted with mild displacement bilaterally. Fracture lines are also noted within the anterior and lateral walls of  the maxillary antra bilaterally. Involvement of the posterior walls of the  maxillary antra are noted as well. No definitive orbital fracture is seen. No muscular entrapment noted. Zygomatic arches are within normal limits. Orbits: The orbits and their contents are within normal limits. Sinuses: Considerable soft tissue swelling is noted within the nasal passages. Air-fluid levels are noted within the maxillary antra bilaterally. Soft tissues: Mild soft tissue swelling is noted in the right forehead. No sizable hematoma seen. No other soft tissue abnormality is noted. IMPRESSION: CT of the maxillofacial bones: Multiple fractures involving the maxillary antra bilaterally as well as the pterygoid plates bilaterally. No definitive orbital involvement is noted. Air-fluid levels within the maxillary antra bilaterally. CT of the head: Chronic changes without acute abnormality. Electronically Signed   By: Inez Catalina M.D.   On: 03/28/2017 17:53   Dg Knee Complete 4 Views Left  Result Date: 03/28/2017 CLINICAL DATA:  Recent fall with knee pain, initial encounter EXAM: LEFT KNEE - COMPLETE 4+ VIEW COMPARISON:  10/12/2016 FINDINGS: There is a transverse fracture through the midportion of the patella with mild distraction of the fracture fragments of approximately 1 cm. Overlying soft tissue swelling and small joint effusion is noted. No other fracture or dislocation is seen. IMPRESSION: Patellar fracture with small joint effusion. No other bony abnormality is seen. Electronically Signed   By: Inez Catalina M.D.   On: 03/28/2017 18:24   Ct Maxillofacial Wo Cm  Result Date: 03/28/2017 CLINICAL DATA:  Recent fall with facial injury and headaches, initial encounter EXAM: CT HEAD WITHOUT CONTRAST CT MAXILLOFACIAL WITHOUT CONTRAST TECHNIQUE: Multidetector CT imaging of the head and maxillofacial structures were performed using the standard protocol without intravenous contrast. Multiplanar CT image reconstructions of the maxillofacial structures were also generated. COMPARISON:  06/11/2009  FINDINGS: CT HEAD FINDINGS Brain: Diffuse atrophic changes are noted. No findings to suggest acute hemorrhage, acute infarction or space-occupying mass lesion are seen. Areas of chronic white matter ischemic change are noted stable from the prior exam. Vascular: No hyperdense vessel or unexpected calcification. Skull: The skull is intact. Multiple fractures are noted involving the nasal bones as well as the maxillary antra bilaterally. These will be better evaluated on the maxillofacial CT. Other: Air-fluid levels are noted in the maxillary antra bilaterally. CT MAXILLOFACIAL FINDINGS Osseous: Bilateral nasal bone fractures are noted with mild impaction. Fractures of the pterygoid plates are noted with mild displacement bilaterally. Fracture lines are also noted within the anterior and lateral walls of the maxillary antra bilaterally. Involvement of the posterior walls of the maxillary antra are noted as well. No definitive orbital fracture is seen. No muscular entrapment noted. Zygomatic arches are within normal limits. Orbits: The orbits and their contents are within normal limits. Sinuses: Considerable soft tissue swelling is noted within the nasal passages. Air-fluid levels are noted within the maxillary antra bilaterally. Soft tissues: Mild soft tissue swelling is noted in the right forehead. No sizable hematoma seen. No other soft tissue abnormality is noted. IMPRESSION: CT of the maxillofacial bones: Multiple fractures involving the maxillary antra bilaterally as well as the pterygoid plates bilaterally. No definitive orbital involvement is noted. Air-fluid levels within the maxillary antra bilaterally. CT of the head: Chronic changes without acute abnormality. Electronically Signed   By: Inez Catalina M.D.   On: 03/28/2017 17:53   [name], MD 04/01/2017, 7:19 AM PGY-1, [place] Intern pager: [phone], text pages welcome

## 2017-04-01 NOTE — Care Management Important Message (Signed)
Important Message  Patient Details  Name: Christy [name] MRN: [id number] Date of Birth: 04-Dec-1940   Medicare Important Message Given:  Yes    Catherene Kaleta 04/01/2017, 11:03 AM

## 2017-04-02 DIAGNOSIS — E860 Dehydration: Secondary | ICD-10-CM

## 2017-04-02 LAB — CBC
HCT: 29.2 % — ABNORMAL LOW (ref 36.0–46.0)
Hemoglobin: 9.4 g/dL — ABNORMAL LOW (ref 12.0–15.0)
MCH: 30.8 pg (ref 26.0–34.0)
MCHC: 32.2 g/dL (ref 30.0–36.0)
MCV: 95.7 fL (ref 78.0–100.0)
PLATELETS: 187 10*3/uL (ref 150–400)
RBC: 3.05 MIL/uL — ABNORMAL LOW (ref 3.87–5.11)
RDW: 13.1 % (ref 11.5–15.5)
WBC: 10.1 10*3/uL (ref 4.0–10.5)

## 2017-04-02 LAB — GLUCOSE, CAPILLARY
GLUCOSE-CAPILLARY: 147 mg/dL — AB (ref 65–99)
Glucose-Capillary: 227 mg/dL — ABNORMAL HIGH (ref 65–99)
Glucose-Capillary: 144 mg/dL — ABNORMAL HIGH (ref 65–99)
Glucose-Capillary: 65 mg/dL (ref 65–99)

## 2017-04-02 MED ORDER — SODIUM CHLORIDE 0.9 % IV SOLN
INTRAVENOUS | Status: AC
  Administered 2017-04-02: 100 mL/h via INTRAVENOUS
  Administered 2017-04-03: 06:00:00 via INTRAVENOUS

## 2017-04-02 MED ORDER — SODIUM CHLORIDE 0.9 % IV BOLUS (SEPSIS)
500.0000 mL | Freq: Once | INTRAVENOUS | Status: AC
  Administered 2017-04-02: 500 mL via INTRAVENOUS

## 2017-04-02 MED ORDER — INSULIN DETEMIR 100 UNIT/ML ~~LOC~~ SOLN
20.0000 [IU] | Freq: Two times a day (BID) | SUBCUTANEOUS | Status: DC
  Administered 2017-04-02 – 2017-04-05 (×8): 20 [IU] via SUBCUTANEOUS
  Filled 2017-04-02 (×8): qty 0.2

## 2017-04-02 MED ORDER — PRO-STAT SUGAR FREE PO LIQD
30.0000 mL | Freq: Two times a day (BID) | ORAL | Status: DC
  Administered 2017-04-02 – 2017-04-05 (×8): 30 mL via ORAL
  Filled 2017-04-02 (×7): qty 30

## 2017-04-02 MED ORDER — GLUCERNA SHAKE PO LIQD
237.0000 mL | Freq: Three times a day (TID) | ORAL | Status: DC
  Administered 2017-04-02 – 2017-04-05 (×11): 237 mL via ORAL

## 2017-04-02 NOTE — Progress Notes (Signed)
Initial Nutrition Assessment  DOCUMENTATION CODES:   Obesity unspecified  INTERVENTION:  Discontinue Ensure.   Provide Glucerna Shake po TID, each supplement provides 220 kcal and 10 grams of protein.  Provide 30 ml Prostat po BID, each supplement provides 100 kcal and 15 grams of protein.   Encourage adequate PO intake.   NUTRITION DIAGNOSIS:   Inadequate oral intake related to poor appetite as evidenced by per patient/family report.  GOAL:   Patient will meet greater than or equal to 90% of their needs  MONITOR:   PO intake, Supplement acceptance, Labs, Weight trends, Skin, I & O's  REASON FOR ASSESSMENT:   Consult Poor PO  ASSESSMENT:    76 y.o. Bosnian only speaking female with medical history significant of HTN, DM type II, osteoporosis, breast cancer; who present after having a fall.  Imaging studies revealed that the patient sustained bilateral distal radius , patella, and facial fractures. Fractures to the bilateral wrists and knee were attempted to be reduced and casted while in the emergency department.    PROCEDURE (5/28): Open treatment of right wrist intra-articular distal radius fracture 3 more fragments Right wrist brachia radialis tendon tenotomy, sliding tenotomy Open treatment of left wrist intra-articluar distal radius fracture 3 or more fragments Left wrist brachia radialis tendon tenotomy, sliding tenotomy  Closed treatment of left distal ulna fracture requiring manipulation, ulnar shaft PROCEDURE (5/30): Left partial patellectomy and patellar tendon advancement  Meal completion has been 10%. Pt reports having a lack of appetite. RD encouraged pt's family to bring in food from home that pt prefers, however pt reports she does not want to eat at that time. Pt reports she will try to eat later today. Per RN, pt has been consuming liquids well. Pt currently has Ensure ordered. Noted pt with diabetes hx. RD to order Glucerna shake and Prostat to aid in  adequate nutrition. Pt encouraged to eat at meals. Weight has been stable. Pt with no observed significant fat or muscle mass loss.   Labs and medications reviewed.   Diet Order:  Diet Carb Modified Fluid consistency: Thin; Room service appropriate? Yes  Skin:   (Incision on arms and L leg)  Last BM:  5/26  Height:   Ht Readings from Last 1 Encounters:  03/28/17 5\' 7"  (1.702 m)    Weight:   Wt Readings from Last 1 Encounters:  03/28/17 224 lb 15.7 oz (102 kg)    Ideal Body Weight:  61.36 kg  BMI:  Body mass index is 35.24 kg/m.  Estimated Nutritional Needs:   Kcal:  1800-1900  Protein:  90-100 grams  Fluid:  1.8 - 1.9 L/day  EDUCATION NEEDS:   No education needs identified at this time  Corrin Parker, MS, RD, LDN Pager # [phone] After hours/ weekend pager # [phone]

## 2017-04-02 NOTE — Progress Notes (Signed)
Physical Therapy Treatment Patient Details Name: Christy [name] MRN: [id number] DOB: [birth year] Today's Date: 04/02/2017    History of Present Illness 76 y.o. Bosnian only speaking female with medical history significant of HTN, DM type II, osteoporosis, breast cancer; who present after having a fall. Patient was leaving church with her son when she states her left leg went out and she fell face forward onto the side of the road and sustained bilateral distal radius, patella, and facial fractures. Underwent bilateral wrist surgery on 03/29/16.     PT Comments    Pt able to minimally help with her transfer via [place] from recliner back to bed after sitting up for 2 hours. Pt is making slow progress towards her goals. Pt requires skilled PT to continue to progress transfer training and to maintain strength and ROM to safely mobilize in her discharge environment.     Follow Up Recommendations  SNF;Supervision/Assistance - 24 hour     Equipment Recommendations  Other (comment) (TBA)       Precautions / Restrictions Precautions Precautions: Fall Required Braces or Orthoses: Knee Immobilizer - Left;Other Brace/Splint (Awaiting Bledsoe brace) Restrictions Weight Bearing Restrictions: Yes (NWB) RUE Weight Bearing: Weight bear through elbow only LUE Weight Bearing: Weight bear through elbow only LLE Weight Bearing: Weight bearing as tolerated    Mobility  Bed Mobility Overal bed mobility: Needs Assistance Bed Mobility: Rolling Rolling: Max assist;+2 for physical assistance   Supine to sit: Max assist;Total assist;+2 for physical assistance;HOB elevated     General bed mobility comments: pt able to initiate rolling but needs maxAx2 for coming up on hip  Transfers Overall transfer level: Needs assistance Equipment used: None Transfers: Lateral/Scoot Transfers          Lateral/Scoot Transfers: Max assist;+2 physical assistance;From elevated surface General transfer comment: pt  able to help with posterior scoot up in recliner by pushing through RLE                  Balance Overall balance assessment: Needs assistance Sitting-balance support:  (only right foot on floor) Sitting balance-Leahy Scale: Fair Sitting balance - Comments: able to self correct lean to sit upright                                    Cognition Arousal/Alertness: Awake/alert Behavior During Therapy: WFL for tasks assessed/performed Overall Cognitive Status: Difficult to assess                                 General Comments: Appears WFL as interpreter pt appears cognitively intact         General Comments General comments (skin integrity, edema, etc.): With tactile and visual cuing pt was able to assist in scooting herself back in the recliner with MaxAx4, pt also helped in off weighting hips left and then right for placement of lift pad for transfer back to the bed.       Pertinent Vitals/Pain Pain Assessment: Faces Faces Pain Scale: Hurts whole lot Pain Location: Both hands and L knee Pain Descriptors / Indicators: Constant;Discomfort;Grimacing;Guarding;Moaning Pain Intervention(s): Monitored during session  VSS           PT Goals (current goals can now be found in the care plan section) Acute Rehab PT Goals Potential to Achieve Goals: Fair Progress towards PT goals: Progressing toward goals  Frequency    Min 3X/week      PT Plan Current plan remains appropriate       AM-PAC PT "6 Clicks" Daily Activity  Outcome Measure  Difficulty turning over in bed (including adjusting bedclothes, sheets and blankets)?: Total Difficulty moving from lying on back to sitting on the side of the bed? : Total Difficulty sitting down on and standing up from a chair with arms (e.g., wheelchair, bedside commode, etc,.)?: Total Help needed moving to and from a bed to chair (including a wheelchair)?: Total Help needed walking in hospital room?:  Total Help needed climbing 3-5 steps with a railing? : Total 6 Click Score: 6    End of Session Equipment Utilized During Treatment: Oxygen Activity Tolerance: Patient limited by fatigue;Patient limited by pain Patient left: in bed;with call bell/phone within reach;with bed alarm set;with SCD's reapplied Nurse Communication: Mobility status;Need for lift equipment PT Visit Diagnosis: Muscle weakness (generalized) (M62.81);Pain;Other abnormalities of gait and mobility (R26.89) Pain - Right/Left:  (bil UEs, left LE) Pain - part of body: Hand;Leg;Knee     Time: 1425-1434 PT Time Calculation (min) (ACUTE ONLY): 9 min  Charges:  $Therapeutic Activity: 8-22 mins                    G Codes:       Christy [name] B. Migdalia Dk PT, DPT Acute Rehabilitation  [phone] Pager [phone]     [place] 04/02/2017, 3:45 PM

## 2017-04-02 NOTE — Clinical Social Work Note (Addendum)
Clinical Social Work Assessment  Patient Details  Name: Christy [name] MRN: [id number] Date of Birth: 12-01-1940  Date of referral:  04/02/17               Reason for consult:  Facility Placement                Permission sought to share information with:  Facility Art therapist granted to share information::  Yes, Verbal Permission Granted  Name::     Christy [name]  Agency::  SNF  Relationship::     Contact Information:     Housing/Transportation Living arrangements for the past 2 months:  Industry of Information:    Patient Interpreter Needed:  [place] Criminal Activity/Legal Involvement Pertinent to Current Situation/Hospitalization:  No - Comment as needed Significant Relationships:  Adult Children Lives with:  Adult Children Do you feel safe going back to the place where you live?  No Need for family participation in patient care:  Yes (Comment)  Care giving concerns:  Patient was independent with ADL's prior to hospitalization. Patient resided with son.  Patient unsafe to return home at this time.  Social Worker assessment / plan:  CSW called daughter Christy [name] and discussed clinical team recommendations for DC to SNF. Family in agreement with rehabilitation. CSW explained role and discussed SNF options/placement.  CSW obtained permission to send out SNF's.  CSW emailed the daughter the list of SNF's that have offered beds.  CSW will need to f/u with family on offers.  FL2 and passr obtained. Offers sent.   Employment status:  Retired Forensic scientist:  Medicare PT Recommendations:  [place] / Referral to community resources:  [place]  Patient/Family's Response to care:  Family is Patent attorney of assistance from [place]. No issues or concerns at this time.  Patient/Family's Understanding of and Emotional Response to Diagnosis, Current Treatment, and Prognosis:  Family has good  understanding of diagnosis, current treatment and prognosis.  Family is hopeful that rehab will address impairment. No issues or concerns at this time.  Emotional Assessment Appearance:  Appears stated age Attitude/Demeanor/Rapport:   (Cooperative) Affect (typically observed):  Accepting, Appropriate Orientation:  Oriented to Self, Oriented to Place, Oriented to  Time, Oriented to Situation Alcohol / Substance use:  Not Applicable Psych involvement (Current and /or in the community):  No (Comment)  Discharge Needs  Concerns to be addressed:  Care Coordination Readmission within the last 30 days:  No Current discharge risk:  Dependent with Mobility, Physical Impairment Barriers to Discharge:  No Barriers Identified   Christy Baxter, LCSW 04/02/2017, 4:25 PM

## 2017-04-02 NOTE — Progress Notes (Signed)
Md on call was notified about bp 96/29 Hr 72 new orders received. Arthor Captain LPN

## 2017-04-02 NOTE — Progress Notes (Signed)
Md on call paged BP 96/29 hr 72. Patient is asymptomatic. Arthor Captain LPN

## 2017-04-02 NOTE — Progress Notes (Signed)
Md on call notified of patient post 591ml bolus bp124/47 HR 67. Arthor Captain LPN

## 2017-04-02 NOTE — Progress Notes (Signed)
Family Medicine Teaching Service Daily Progress Note Intern Pager: [phone]  Patient name: Christy [name] Medical record number: [id number] Date of birth: 08-01-1941 Age: 76 y.o. Gender: female  Primary Care Provider: [name], MD Consultants: Cardiology, Hand surgery, Orthopedics Code Status: Full    Assessment and Plan:  #Bilateral distal radius fracture, s/p repair Status postsurgical repair by Dr. Apolonio Schneiders 5/28. PT/OT recommended SNF placement with 24 hr assistance --Continue hydrocodone-acetaminophen 5-325 mg q6  #Nasal and maxillary fractures.  ENT assessed and did not find any significant deformity. Likely will not need any surgical intervention at the moment. --Follow up with ENT as an outpatient in one week .  #Left Patella fracture s/p repair on 5/30 Patient was seen by Dr.Xu (orthopedic surgeon) this morning who recommended the following: --Follow up on am CBC --Place patient on SCD --Weight-bearing as tolerated in Bledsoe brace or KI --Recommended SNF --Norco/Vicodin 5-325 mg   #Elevated troponin Likely stress related, Echo showed an EF of 60-65% with normal systolic function and normal wall motion with G1DD. Patient has been seen in consultation by cardiology who felt no further cardiac workup needed at this time with outpatient follow-up. --Continue aspirin 325 mg daily  #Diabetes mellitus, uncontrolled Last A1c 9.5 01/06/2017. Patient was supposed to be on Levemir 26 units, with 2000 mg of metformin. During this hospitalization requiring about 35 units of long acting, and meal coverage NovoLog 5 units added as well as insulin sliding scale with better control. --Continue to hold oral hypoglycemic agents --Continue resistant SSI  #ARF, resolved --Baseline creatinine 1.2, she presents with a creatinine of 1.7, and BUN of 44, improved with IV fluids. This morning creatinine is 0.82.  --Will continue to monitor  #Hypertension, well controlled  BP  110/44 this am. --Continue Norvasc 10 mg daily --Could restart Lisinopril now that AKI has resolved  #Hyperlipidemia --Continue Zocor and Fenofibrate.  #Osteoporosis --Continue Fosamax and vitamin D  #Social Discussed with SW placement yesterday. CIR evaluation orders placed. --Will follow up SW for placement  FEN/GI: Heart healthy, carb modified PPx: SCD  Disposition: Patient is medically stable and ready for SNF placement vs CIR  Subjective:  Patient is stable fine this morning, reports mild left knee pain but overall pain is well controlled. Mildly dehydrated which explained low BP this morning.  Objective: Temp:  [98.2 F (36.8 C)-99.3 F (37.4 C)] 98.2 F (36.8 C) (06/01 0404) Pulse Rate:  [67-76] 69 (06/01 0404) Resp:  [18] 18 (06/01 0404) BP: (72-152)/(29-51) 125/48 (06/01 0404) SpO2:  [94 %-99 %] 99 % (06/01 0404)  Physical Exam: General: NAD, able to answer questions Cardiac: RRR, normal heart sounds, no murmurs. 2+ radial and PT pulses bilaterally Respiratory: CTAB, normal effort, No wheezes, rales or rhonchi Abdomen: soft, nontender, nondistended, no hepatic or splenomegaly, +BS Extremities: bilateral forearm wrap in ace bandage, and left leg with brace unable to perform ROM due to pain. Skin: warm and dry, no rashes noted Neuro: alert and oriented x4, no focal deficits Psych: Normal affect and mood   Laboratory:  Recent Labs Lab 03/29/17 0623 03/31/17 0846 04/01/17 0556  WBC 15.7* 11.7* 12.6*  HGB 12.1 9.8* 9.9*  HCT 37.6 31.2* 32.2*  PLT 184 151 165    Recent Labs Lab 03/30/17 1500 03/31/17 0846 04/01/17 0556  NA 138 138 137  K 3.8 3.8 4.3  CL 102 103 104  CO2 27 29 23   BUN 22* 21* 17  CREATININE 0.88 0.84 0.82  CALCIUM 8.2* 7.9* 7.8*  GLUCOSE 226*  219* 241*    Imaging/Diagnostic Tests: Dg Chest 1 View  Result Date: 03/28/2017 CLINICAL DATA:  Legs gave out, patient fell, deformities to LEFT and RIGHT wrists, laceration to  bridge of nose, LEFT knee abrasion EXAM: CHEST 1 VIEW COMPARISON:  07/02/2010 FINDINGS: Enlargement of cardiac silhouette with slight vascular congestion. Mediastinal contours normal. Interval removal of Port-A-Cath. Atherosclerotic calcification aorta. Mild bibasilar atelectasis versus infiltrate. Upper lungs clear. No pleural effusion or pneumothorax. Bones demineralized. IMPRESSION: Enlargement of cardiac silhouette with pulmonary vascular congestion. Mild bibasilar atelectasis versus infiltrate. Electronically Signed   By: Lavonia Dana M.D.   On: 03/28/2017 18:27   Dg Wrist Complete Left  Result Date: 03/28/2017 CLINICAL DATA:  75 year old female with history of trauma from a fall complaining of pain in the left wrist. EXAM: LEFT WRIST - COMPLETE 3+ VIEW COMPARISON:  Left hand radiograph 06/13/2009. FINDINGS: Highly comminuted fractures of the distal radius and ulna are noted. The radial fracture is highly comminuted, intra-articular, impacted and displaced, with extension of the fracture through the metaphyseal region into the distal diaphyseal region. Individual fracture fragments are displaced in a random distribution, generally extending outward from the distal radius. There is some very mild volar angulation on the cross-table lateral view (approximately 15-20 degrees). Distal ulnar fracture is also highly comminuted, with radial displacement of the distal ulna approximately 1 to 1-1/2 shaft width. There also appears to be some distal migration of the proximal ulnar fragment into the overlying soft tissues which are markedly swollen. The carpal some cells appear intact. IMPRESSION: 1. Highly comminuted, displaced and angulated intra-articular fractures of the distal radius and ulna, as detailed above. Electronically Signed   By: Vinnie Langton M.D.   On: 03/28/2017 18:27   Dg Wrist Complete Right  Result Date: 03/28/2017 CLINICAL DATA:  Recent fall with wrist pain, initial encounter EXAM: RIGHT  WRIST - COMPLETE 3+ VIEW COMPARISON:  06/11/2009 FINDINGS: Fixation sideplate is noted along the distal radius in an area of prior fracture. A new fracture is noted in the radial metaphysis with posterior angulation. Additionally there is fracture of the fixation sideplate distally at the area of fracture. Deformity of the distal ulna is noted related to prior fracture and healing. No other are seen. IMPRESSION: Changes consistent with previous healed radial and ulnar fractures with surgical fixation. New radial metaphyseal fracture with posterior angulation and fracture of the fixation sideplate. Electronically Signed   By: Inez Catalina M.D.   On: 03/28/2017 18:26   Ct Head Wo Contrast  Result Date: 03/28/2017 CLINICAL DATA:  Recent fall with facial injury and headaches, initial encounter EXAM: CT HEAD WITHOUT CONTRAST CT MAXILLOFACIAL WITHOUT CONTRAST TECHNIQUE: Multidetector CT imaging of the head and maxillofacial structures were performed using the standard protocol without intravenous contrast. Multiplanar CT image reconstructions of the maxillofacial structures were also generated. COMPARISON:  06/11/2009 FINDINGS: CT HEAD FINDINGS Brain: Diffuse atrophic changes are noted. No findings to suggest acute hemorrhage, acute infarction or space-occupying mass lesion are seen. Areas of chronic white matter ischemic change are noted stable from the prior exam. Vascular: No hyperdense vessel or unexpected calcification. Skull: The skull is intact. Multiple fractures are noted involving the nasal bones as well as the maxillary antra bilaterally. These will be better evaluated on the maxillofacial CT. Other: Air-fluid levels are noted in the maxillary antra bilaterally. CT MAXILLOFACIAL FINDINGS Osseous: Bilateral nasal bone fractures are noted with mild impaction. Fractures of the pterygoid plates are noted with mild displacement bilaterally. Fracture lines are  also noted within the anterior and lateral walls of  the maxillary antra bilaterally. Involvement of the posterior walls of the maxillary antra are noted as well. No definitive orbital fracture is seen. No muscular entrapment noted. Zygomatic arches are within normal limits. Orbits: The orbits and their contents are within normal limits. Sinuses: Considerable soft tissue swelling is noted within the nasal passages. Air-fluid levels are noted within the maxillary antra bilaterally. Soft tissues: Mild soft tissue swelling is noted in the right forehead. No sizable hematoma seen. No other soft tissue abnormality is noted. IMPRESSION: CT of the maxillofacial bones: Multiple fractures involving the maxillary antra bilaterally as well as the pterygoid plates bilaterally. No definitive orbital involvement is noted. Air-fluid levels within the maxillary antra bilaterally. CT of the head: Chronic changes without acute abnormality. Electronically Signed   By: Inez Catalina M.D.   On: 03/28/2017 17:53   Dg Knee Complete 4 Views Left  Result Date: 03/28/2017 CLINICAL DATA:  Recent fall with knee pain, initial encounter EXAM: LEFT KNEE - COMPLETE 4+ VIEW COMPARISON:  10/12/2016 FINDINGS: There is a transverse fracture through the midportion of the patella with mild distraction of the fracture fragments of approximately 1 cm. Overlying soft tissue swelling and small joint effusion is noted. No other fracture or dislocation is seen. IMPRESSION: Patellar fracture with small joint effusion. No other bony abnormality is seen. Electronically Signed   By: Inez Catalina M.D.   On: 03/28/2017 18:24   Ct Maxillofacial Wo Cm  Result Date: 03/28/2017 CLINICAL DATA:  Recent fall with facial injury and headaches, initial encounter EXAM: CT HEAD WITHOUT CONTRAST CT MAXILLOFACIAL WITHOUT CONTRAST TECHNIQUE: Multidetector CT imaging of the head and maxillofacial structures were performed using the standard protocol without intravenous contrast. Multiplanar CT image reconstructions of the  maxillofacial structures were also generated. COMPARISON:  06/11/2009 FINDINGS: CT HEAD FINDINGS Brain: Diffuse atrophic changes are noted. No findings to suggest acute hemorrhage, acute infarction or space-occupying mass lesion are seen. Areas of chronic white matter ischemic change are noted stable from the prior exam. Vascular: No hyperdense vessel or unexpected calcification. Skull: The skull is intact. Multiple fractures are noted involving the nasal bones as well as the maxillary antra bilaterally. These will be better evaluated on the maxillofacial CT. Other: Air-fluid levels are noted in the maxillary antra bilaterally. CT MAXILLOFACIAL FINDINGS Osseous: Bilateral nasal bone fractures are noted with mild impaction. Fractures of the pterygoid plates are noted with mild displacement bilaterally. Fracture lines are also noted within the anterior and lateral walls of the maxillary antra bilaterally. Involvement of the posterior walls of the maxillary antra are noted as well. No definitive orbital fracture is seen. No muscular entrapment noted. Zygomatic arches are within normal limits. Orbits: The orbits and their contents are within normal limits. Sinuses: Considerable soft tissue swelling is noted within the nasal passages. Air-fluid levels are noted within the maxillary antra bilaterally. Soft tissues: Mild soft tissue swelling is noted in the right forehead. No sizable hematoma seen. No other soft tissue abnormality is noted. IMPRESSION: CT of the maxillofacial bones: Multiple fractures involving the maxillary antra bilaterally as well as the pterygoid plates bilaterally. No definitive orbital involvement is noted. Air-fluid levels within the maxillary antra bilaterally. CT of the head: Chronic changes without acute abnormality. Electronically Signed   By: Inez Catalina M.D.   On: 03/28/2017 17:53   [name], MD 04/02/2017, 7:22 AM PGY-1, [place] Intern pager: [phone],  text pages welcome

## 2017-04-02 NOTE — Progress Notes (Signed)
Physical Therapy Treatment Patient Details Name: Christy [name] MRN: [id number] DOB: 07-Apr-1941 Today's Date: 04/02/2017    History of Present Illness 76 y.o. [place]nian only speaking female with medical history significant of HTN, DM type II, osteoporosis, breast cancer; who present after having a fall. Patient was leaving church with her son when she states her left leg went out and she fell face forward onto the side of the road and sustained bilateral distal radius, patella, and facial fractures. Underwent bilateral wrist surgery on 03/29/16.     PT Comments    Pt admitted with above diagnosis. Pt refused to try to come up to standing in bilateral platform walker Pt continues to be limited by fear and pain but she did attempt to help with bed mobility and  pivot transfer to recliner. Very limited currently due ability to only bear weight through bilateral elbows and her left LE in Bledsoe brace.  Pt will benefit from skilled PT to increase their independence and safety with mobility to allow discharge to the venue listed below.     Follow Up Recommendations  SNF;Supervision/Assistance - 24 hour     Equipment Recommendations  Other (comment) (TBA)    Recommendations for Other Services       Precautions / Restrictions Precautions Precautions: Fall Required Braces or Orthoses: Knee Immobilizer - Left;Other Brace/Splint (Awaiting Bledsoe brace) Restrictions Weight Bearing Restrictions: Yes (NWB) RUE Weight Bearing: Weight bear through elbow only LUE Weight Bearing: Weight bear through elbow only LLE Weight Bearing: Weight bearing as tolerated    Mobility  Bed Mobility Overal bed mobility: Needs Assistance Bed Mobility: Supine to Sit     Supine to sit: Max assist;Total assist;+2 for physical assistance;HOB elevated     General bed mobility comments: Pt requires A to elevate trunk and transition hips to EOB with use of pad. Pt able to lean forward once trunk lifted off bed    Transfers Overall transfer level: Needs assistance Equipment used: None Transfers: Lateral/Scoot Transfers          Lateral/Scoot Transfers: Max assist;+2 physical assistance;From elevated surface General transfer comment: pt maxAx3, 2 in front and 1 in back for segmented pivot transfer to drop arm recliner, pt tried to help with offweighting her hips with push up through R LE              Balance Overall balance assessment: Needs assistance Sitting-balance support: No upper extremity supported (only right foot on floor) Sitting balance-Leahy Scale: Fair Sitting balance - Comments: able to self correct lean to sit upright                                    Cognition Arousal/Alertness: Awake/alert Behavior During Therapy: WFL for tasks assessed/performed Overall Cognitive Status: Difficult to assess                                 General Comments: Appears WFL as interpreter pt appears cognitively intact         General Comments General comments (skin integrity, edema, etc.): Used ipad interpreter      Pertinent Vitals/Pain Pain Assessment: Faces Faces Pain Scale: Hurts even more Pain Location: Both hands and L knee Pain Descriptors / Indicators: Constant;Discomfort;Grimacing;Guarding;Moaning Pain Intervention(s): Limited activity within patient's tolerance;Monitored during session  VSS           PT  Goals (current goals can now be found in the care plan section)      Frequency    Min 6X/week      PT Plan Current plan remains appropriate       AM-PAC PT "6 Clicks" Daily Activity  Outcome Measure  Difficulty turning over in bed (including adjusting bedclothes, sheets and blankets)?: Total Difficulty moving from lying on back to sitting on the side of the bed? : Total Difficulty sitting down on and standing up from a chair with arms (e.g., wheelchair, bedside commode, etc,.)?: Total Help needed moving to and from a bed  to chair (including a wheelchair)?: Total Help needed walking in hospital room?: Total Help needed climbing 3-5 steps with a railing? : Total 6 Click Score: 6    End of Session Equipment Utilized During Treatment: Oxygen Activity Tolerance: Patient limited by fatigue;Patient limited by pain Patient left: in bed;with call bell/phone within reach;with bed alarm set;with SCD's reapplied Nurse Communication: Mobility status;Need for lift equipment PT Visit Diagnosis: Muscle weakness (generalized) (M62.81);Pain;Other abnormalities of gait and mobility (R26.89) Pain - Right/Left:  (bil UEs, left LE) Pain - part of body: Hand;Leg;Knee     Time: 4585-9292 PT Time Calculation (min) (ACUTE ONLY): 48 min  Charges:  $Therapeutic Activity: 38-52 mins                    G Codes:       Gianno Volner B. Migdalia Dk PT, DPT Acute Rehabilitation  [phone] Pager [phone]     [place] 04/02/2017, 2:47 PM

## 2017-04-02 NOTE — Progress Notes (Signed)
Discussed with Dr. Naaman Plummer. Pt not a candidate to admit to inpt rehab program. SNF rehab is recommended. I have notified RN CM. We will sign off. [phone]

## 2017-04-02 NOTE — Progress Notes (Signed)
Spoke with patient's daughter Redgie Grayer to give her update that her mother has not been eating or drinking today. Let her know she is getting IVFs. Also let her know about consult to Palliative Care team to discuss goals of care, as patient has been talking a lot about dying. Patient's daughter does note that it is not new for her mother to talk about dying, as she jokes about wanting to die if in an unpleasant situation or hurting, and thinks she means it as a figure of speech. Redgie Grayer asks that the Palliative Care team call her sister [name] [phone]) tomorrow with time of meeting.

## 2017-04-03 ENCOUNTER — Inpatient Hospital Stay (HOSPITAL_COMMUNITY): Payer: Medicare Other

## 2017-04-03 LAB — BASIC METABOLIC PANEL
ANION GAP: 9 (ref 5–15)
BUN: 21 mg/dL — ABNORMAL HIGH (ref 6–20)
CO2: 26 mmol/L (ref 22–32)
Calcium: 8.1 mg/dL — ABNORMAL LOW (ref 8.9–10.3)
Chloride: 100 mmol/L — ABNORMAL LOW (ref 101–111)
Creatinine, Ser: 0.69 mg/dL (ref 0.44–1.00)
GFR calc Af Amer: >60 mL/min (ref >60)
Glucose, Bld: 186 mg/dL — ABNORMAL HIGH (ref 65–99)
POTASSIUM: 3.4 mmol/L — AB (ref 3.5–5.1)
SODIUM: 135 mmol/L (ref 135–145)

## 2017-04-03 LAB — CBC
HCT: 30 % — ABNORMAL LOW (ref 36.0–46.0)
Hemoglobin: 9.2 g/dL — ABNORMAL LOW (ref 12.0–15.0)
MCH: 29.5 pg (ref 26.0–34.0)
MCHC: 30.7 g/dL (ref 30.0–36.0)
MCV: 96.2 fL (ref 78.0–100.0)
PLATELETS: 194 10*3/uL (ref 150–400)
RBC: 3.12 MIL/uL — AB (ref 3.87–5.11)
RDW: 12.8 % (ref 11.5–15.5)
WBC: 8.6 10*3/uL (ref 4.0–10.5)

## 2017-04-03 LAB — GLUCOSE, CAPILLARY
GLUCOSE-CAPILLARY: 179 mg/dL — AB (ref 65–99)
GLUCOSE-CAPILLARY: 245 mg/dL — AB (ref 65–99)
GLUCOSE-CAPILLARY: 145 mg/dL — AB (ref 65–99)
Glucose-Capillary: 155 mg/dL — ABNORMAL HIGH (ref 65–99)

## 2017-04-03 MED ORDER — POLYETHYLENE GLYCOL 3350 17 G PO PACK
17.0000 g | PACK | Freq: Every day | ORAL | Status: DC | PRN
  Administered 2017-04-04: 17 g via ORAL
  Filled 2017-04-03: qty 1

## 2017-04-03 MED ORDER — OXYCODONE HCL 5 MG PO TABS
5.0000 mg | ORAL_TABLET | ORAL | Status: DC | PRN
  Administered 2017-04-03 – 2017-04-05 (×6): 10 mg via ORAL
  Filled 2017-04-03 (×6): qty 2

## 2017-04-03 MED ORDER — SENNA 8.6 MG PO TABS: 1.0000 | ORAL_TABLET | Freq: Every day | ORAL | Status: DC | PRN

## 2017-04-03 NOTE — Progress Notes (Signed)
Orthopedic Tech Progress Note Patient Details:  Christy [name] 02/25/1941 [id number]  Ortho Devices Type of Ortho Device: Ace wrap, Knee Immobilizer, Sugartong splint Ortho Device/Splint Location: (B) short arm splints LLE KI Ortho Device/Splint Interventions: Ordered, Application   [place] Pink 04/03/2017, 12:41 PMCalled [employer] for left Bledsoe brace 0-30.

## 2017-04-03 NOTE — Progress Notes (Signed)
PMT no charge note  Consult request received. Call placed and discussed with daughter, who stated that the primary service has done an excellent job communicating with them, they have no concerns or discussions they would like to have with [place], they want a good SNF for her. Daughter was a little frustrated that perhaps the interpretor services did not pick up on the fact the patient uses the phrase " I want to die" as a figure of speech all the time. Daughter does not feel that the patient meant anything by that statement.  I'd recommend trying Oxy IR PO PRN for pain, please add a bowel and anti emetic regimen, while she is on opioids for pain. Agree with SNF. No other palliative recommendations at this time.   Thank you for the consult Loistine Chance MD [place] team [phone]

## 2017-04-03 NOTE — Progress Notes (Signed)
Family Medicine Teaching Service Daily Progress Note Intern Pager: [phone]  Patient name: Christy [name] Medical record number: [id number] Date of birth: September 17, 1941 Age: 76 y.o. Gender: female  Primary Care Provider: [name], MD Consultants: Cardiology, Hand surgery, Orthopedics Code Status: Full    Assessment and Plan:  #Bilateral distal radius fracture, s/p repair.  Status postsurgical repair by Dr. Apolonio Schneiders 5/28. PT/OT recommended SNF placement with 24 hr assistance.  Patient's pain is well controlled. - Continue hydrocodone-acetaminophen 5-325 mg q6 prn.  (has had 1 dose/24 hours) - continue Vit D supplementation. - Has h/o osteoporosis w/ Fosamax amongst her home medications.  Will ask ortho to guide as to when we can restart this medication in setting of acute fractures. - CIR has evaluated.  They recommend SNF placement.  # O2 requirement?: Initially required O2 in ED after being given pain medications for O2 sat 86%.  Meds have been deescalated since then so I am unsure as to why she has an O2 requirement now.  Apparently has been on 3L O2 via [place] since 5/31.  No notes to indicate why she is needing this.  Possibly for comfort.  Discussed with bedside RN, who is also unaware why she has O2 on.  No home O2 need at baseline. - Obtain CXR - Wean O2 as tolerated.  #Nasal and maxillary fractures.  ENT assessed and did not find any significant deformity. Likely will not need any surgical intervention at the moment. --Follow up with ENT as an outpatient in one week .  #Left Patella fracture s/p repair on 5/30  Patient was seen by Dr.Xu (orthopedic surgeon) who recommended the following: --Weight-bearing as tolerated in Bledsoe brace or KI --Recommended SNF --Norco/Vicodin 5-325 mg   #Elevated troponin Likely stress related, Echo showed an EF of 60-65% with normal systolic function and normal wall motion with G1DD. Patient has been seen in consultation by cardiology who felt  no further cardiac workup needed at this time with outpatient follow-up. --Continue aspirin 325 mg daily  #Diabetes mellitus, uncontrolled.  Last A1c 9.5 01/06/2017. BGs 65-180s over last 24h. --Continue to hold oral hypoglycemic agents --Continue resistant SSI w/ scheduled Novolog 5 units TID w/ meals.  #ARF, resolved This morning creatinine is 0.69.  --Will continue to monitor  #Hypertension, well controlled  BP 122/34 this am. --Continue Norvasc 10 mg daily, Lisinopril   #Hyperlipidemia --Continue Zocor and Fenofibrate.  #Osteoporosis --Continue Fosamax and vitamin D  #Social Discussed with SW placement yesterday. CIR evaluation orders placed. --Will follow up SW for placement --Palliative to meet with daughter today for [place] discussion.  FEN/GI: Heart healthy, carb modified PPx: SCD  Disposition: Patient is medically stable and ready for SNF placement vs CIR  Subjective:  Family member at bedside provides [name] interpretation for visit, as there are no [place] interpreters available for either [name] or [place] language.  Patient notes pain in her knee this am.  She reports good relief with Norco.  Somewhat thirsty this am.  Otherwise, no concerns.  Family at bedside voice no concerns.  Objective: Temp:  [98.1 F (36.7 C)-99.4 F (37.4 C)] 99.4 F (37.4 C) (06/02 0400) Pulse Rate:  [68-80] 73 (06/02 0400) Resp:  [16-20] 18 (06/02 0400) BP: (122-162)/(34-64) 122/34 (06/02 0400) SpO2:  [94 %-99 %] 99 % (06/02 0400)  Physical Exam: General: facial excoriations that appear to be well healing.  NAD, able to answer questions Cardiac: RRR, normal heart sounds, no murmurs. 2+ radial and PT pulses bilaterally Respiratory: CTAB, normal  effort, No wheezes, rales or rhonchi; On 3L [place]. Abdomen: obese, soft, nontender, nondistended, no hepatic or splenomegaly, +BS Extremities: bilateral forearm wrap in ace bandage, and left leg with brace; right dorsum of foot with  peripheral IV in place. Skin: warm and dry, excoriations as above Neuro: no focal deficits Psych: Normal affect and mood, pleasant  Laboratory:  Recent Labs Lab 04/01/17 0556 04/02/17 0804 04/03/17 0649  WBC 12.6* 10.1 8.6  HGB 9.9* 9.4* 9.2*  HCT 32.2* 29.2* 30.0*  PLT 165 187 194    Recent Labs Lab 03/31/17 0846 04/01/17 0556 04/03/17 0649  NA 138 137 135  K 3.8 4.3 3.4*  CL 103 104 100*  CO2 29 23 26   BUN 21* 17 21*  CREATININE 0.84 0.82 0.69  CALCIUM 7.9* 7.8* 8.1*  GLUCOSE 219* 241* 186*    Imaging/Diagnostic Tests: Dg Chest 1 View  Result Date: 03/28/2017 CLINICAL DATA:  Legs gave out, patient fell, deformities to LEFT and RIGHT wrists, laceration to bridge of nose, LEFT knee abrasion EXAM: CHEST 1 VIEW COMPARISON:  07/02/2010 FINDINGS: Enlargement of cardiac silhouette with slight vascular congestion. Mediastinal contours normal. Interval removal of Port-A-Cath. Atherosclerotic calcification aorta. Mild bibasilar atelectasis versus infiltrate. Upper lungs clear. No pleural effusion or pneumothorax. Bones demineralized. IMPRESSION: Enlargement of cardiac silhouette with pulmonary vascular congestion. Mild bibasilar atelectasis versus infiltrate. Electronically Signed   By: Lavonia Dana M.D.   On: 03/28/2017 18:27   Dg Wrist Complete Left  Result Date: 03/28/2017 CLINICAL DATA:  76 year old female with history of trauma from a fall complaining of pain in the left wrist. EXAM: LEFT WRIST - COMPLETE 3+ VIEW COMPARISON:  Left hand radiograph 06/13/2009. FINDINGS: Highly comminuted fractures of the distal radius and ulna are noted. The radial fracture is highly comminuted, intra-articular, impacted and displaced, with extension of the fracture through the metaphyseal region into the distal diaphyseal region. Individual fracture fragments are displaced in a random distribution, generally extending outward from the distal radius. There is some very mild volar angulation on the  cross-table lateral view (approximately 15-20 degrees). Distal ulnar fracture is also highly comminuted, with radial displacement of the distal ulna approximately 1 to 1-1/2 shaft width. There also appears to be some distal migration of the proximal ulnar fragment into the overlying soft tissues which are markedly swollen. The carpal some cells appear intact. IMPRESSION: 1. Highly comminuted, displaced and angulated intra-articular fractures of the distal radius and ulna, as detailed above. Electronically Signed   By: Vinnie Langton M.D.   On: 03/28/2017 18:27   Dg Wrist Complete Right  Result Date: 03/28/2017 CLINICAL DATA:  Recent fall with wrist pain, initial encounter EXAM: RIGHT WRIST - COMPLETE 3+ VIEW COMPARISON:  06/11/2009 FINDINGS: Fixation sideplate is noted along the distal radius in an area of prior fracture. A new fracture is noted in the radial metaphysis with posterior angulation. Additionally there is fracture of the fixation sideplate distally at the area of fracture. Deformity of the distal ulna is noted related to prior fracture and healing. No other are seen. IMPRESSION: Changes consistent with previous healed radial and ulnar fractures with surgical fixation. New radial metaphyseal fracture with posterior angulation and fracture of the fixation sideplate. Electronically Signed   By: Inez Catalina M.D.   On: 03/28/2017 18:26   Ct Head Wo Contrast  Result Date: 03/28/2017 CLINICAL DATA:  Recent fall with facial injury and headaches, initial encounter EXAM: CT HEAD WITHOUT CONTRAST CT MAXILLOFACIAL WITHOUT CONTRAST TECHNIQUE: Multidetector CT imaging of  the head and maxillofacial structures were performed using the standard protocol without intravenous contrast. Multiplanar CT image reconstructions of the maxillofacial structures were also generated. COMPARISON:  06/11/2009 FINDINGS: CT HEAD FINDINGS Brain: Diffuse atrophic changes are noted. No findings to suggest acute hemorrhage, acute  infarction or space-occupying mass lesion are seen. Areas of chronic white matter ischemic change are noted stable from the prior exam. Vascular: No hyperdense vessel or unexpected calcification. Skull: The skull is intact. Multiple fractures are noted involving the nasal bones as well as the maxillary antra bilaterally. These will be better evaluated on the maxillofacial CT. Other: Air-fluid levels are noted in the maxillary antra bilaterally. CT MAXILLOFACIAL FINDINGS Osseous: Bilateral nasal bone fractures are noted with mild impaction. Fractures of the pterygoid plates are noted with mild displacement bilaterally. Fracture lines are also noted within the anterior and lateral walls of the maxillary antra bilaterally. Involvement of the posterior walls of the maxillary antra are noted as well. No definitive orbital fracture is seen. No muscular entrapment noted. Zygomatic arches are within normal limits. Orbits: The orbits and their contents are within normal limits. Sinuses: Considerable soft tissue swelling is noted within the nasal passages. Air-fluid levels are noted within the maxillary antra bilaterally. Soft tissues: Mild soft tissue swelling is noted in the right forehead. No sizable hematoma seen. No other soft tissue abnormality is noted. IMPRESSION: CT of the maxillofacial bones: Multiple fractures involving the maxillary antra bilaterally as well as the pterygoid plates bilaterally. No definitive orbital involvement is noted. Air-fluid levels within the maxillary antra bilaterally. CT of the head: Chronic changes without acute abnormality. Electronically Signed   By: Inez Catalina M.D.   On: 03/28/2017 17:53   Dg Knee Complete 4 Views Left  Result Date: 03/28/2017 CLINICAL DATA:  Recent fall with knee pain, initial encounter EXAM: LEFT KNEE - COMPLETE 4+ VIEW COMPARISON:  10/12/2016 FINDINGS: There is a transverse fracture through the midportion of the patella with mild distraction of the fracture  fragments of approximately 1 cm. Overlying soft tissue swelling and small joint effusion is noted. No other fracture or dislocation is seen. IMPRESSION: Patellar fracture with small joint effusion. No other bony abnormality is seen. Electronically Signed   By: Inez Catalina M.D.   On: 03/28/2017 18:24   Ct Maxillofacial Wo Cm  Result Date: 03/28/2017 CLINICAL DATA:  Recent fall with facial injury and headaches, initial encounter EXAM: CT HEAD WITHOUT CONTRAST CT MAXILLOFACIAL WITHOUT CONTRAST TECHNIQUE: Multidetector CT imaging of the head and maxillofacial structures were performed using the standard protocol without intravenous contrast. Multiplanar CT image reconstructions of the maxillofacial structures were also generated. COMPARISON:  06/11/2009 FINDINGS: CT HEAD FINDINGS Brain: Diffuse atrophic changes are noted. No findings to suggest acute hemorrhage, acute infarction or space-occupying mass lesion are seen. Areas of chronic white matter ischemic change are noted stable from the prior exam. Vascular: No hyperdense vessel or unexpected calcification. Skull: The skull is intact. Multiple fractures are noted involving the nasal bones as well as the maxillary antra bilaterally. These will be better evaluated on the maxillofacial CT. Other: Air-fluid levels are noted in the maxillary antra bilaterally. CT MAXILLOFACIAL FINDINGS Osseous: Bilateral nasal bone fractures are noted with mild impaction. Fractures of the pterygoid plates are noted with mild displacement bilaterally. Fracture lines are also noted within the anterior and lateral walls of the maxillary antra bilaterally. Involvement of the posterior walls of the maxillary antra are noted as well. No definitive orbital fracture is seen. No muscular  entrapment noted. Zygomatic arches are within normal limits. Orbits: The orbits and their contents are within normal limits. Sinuses: Considerable soft tissue swelling is noted within the nasal passages.  Air-fluid levels are noted within the maxillary antra bilaterally. Soft tissues: Mild soft tissue swelling is noted in the right forehead. No sizable hematoma seen. No other soft tissue abnormality is noted. IMPRESSION: CT of the maxillofacial bones: Multiple fractures involving the maxillary antra bilaterally as well as the pterygoid plates bilaterally. No definitive orbital involvement is noted. Air-fluid levels within the maxillary antra bilaterally. CT of the head: Chronic changes without acute abnormality. Electronically Signed   By: Inez Catalina M.D.   On: 03/28/2017 17:53   Janora Norlander, DO 04/03/2017, 8:50 AM PGY-3, [place] Intern pager: [phone], text pages welcome

## 2017-04-04 LAB — GLUCOSE, CAPILLARY
GLUCOSE-CAPILLARY: 117 mg/dL — AB (ref 65–99)
Glucose-Capillary: 185 mg/dL — ABNORMAL HIGH (ref 65–99)
Glucose-Capillary: 180 mg/dL — ABNORMAL HIGH (ref 65–99)
Glucose-Capillary: 164 mg/dL — ABNORMAL HIGH (ref 65–99)

## 2017-04-04 LAB — URINALYSIS, ROUTINE W REFLEX MICROSCOPIC
BILIRUBIN URINE: NEGATIVE
Glucose, UA: 150 mg/dL — AB
Hgb urine dipstick: NEGATIVE
Ketones, ur: NEGATIVE mg/dL
Nitrite: NEGATIVE
PROTEIN: NEGATIVE mg/dL
Specific Gravity, Urine: 1.011 (ref 1.005–1.030)
pH: 6 (ref 5.0–8.0)

## 2017-04-04 MED ORDER — CIPROFLOXACIN IN D5W 400 MG/200ML IV SOLN
400.0000 mg | Freq: Two times a day (BID) | INTRAVENOUS | Status: DC
  Administered 2017-04-04 – 2017-04-05 (×3): 400 mg via INTRAVENOUS
  Filled 2017-04-04 (×4): qty 200

## 2017-04-04 MED ORDER — CITALOPRAM HYDROBROMIDE 20 MG PO TABS
20.0000 mg | ORAL_TABLET | Freq: Every day | ORAL | Status: DC
  Administered 2017-04-04 – 2017-04-05 (×2): 20 mg via ORAL
  Filled 2017-04-04 (×2): qty 1

## 2017-04-04 MED ORDER — POLYETHYLENE GLYCOL 3350 17 G PO PACK
17.0000 g | PACK | Freq: Every day | ORAL | Status: DC
  Administered 2017-04-05: 17 g via ORAL
  Filled 2017-04-04: qty 1

## 2017-04-04 MED ORDER — SENNA 8.6 MG PO TABS
1.0000 | ORAL_TABLET | Freq: Every day | ORAL | Status: DC
  Administered 2017-04-04 – 2017-04-05 (×2): 8.6 mg via ORAL
  Filled 2017-04-04 (×2): qty 1

## 2017-04-04 MED ORDER — ACETAMINOPHEN 325 MG PO TABS
650.0000 mg | ORAL_TABLET | Freq: Four times a day (QID) | ORAL | Status: DC | PRN
  Administered 2017-04-04: 650 mg via ORAL
  Filled 2017-04-04: qty 2

## 2017-04-04 NOTE — Progress Notes (Signed)
Family Medicine Teaching Service Daily Progress Note Intern Pager: [phone]  Patient name: Christy [name] Medical record number: [id number] Date of birth: 06/03/1941 Age: 76 y.o. Gender: female  Primary Care Provider: [name], MD Consultants: Cardiology, Hand surgery, Orthopedics Code Status: Full    Assessment and Plan:  #Bilateral distal radius fracture, s/p repair.  Status postsurgical repair by Dr. Apolonio Schneiders 5/28. PT/OT recommended SNF placement with 24 hr assistance.  Patient's pain is not well controlled. - Continue oxycodone IR, scheduled tylenol, and prn ibuprofen -scheduled bowel regimen - continue Vit D supplementation. - Has h/o osteoporosis w/ Fosamax amongst her home medications.  Will ask ortho to guide as to when we can restart this medication in setting of acute fractures. - SNF placement  # Mood. No history of depression. Not currently on therapy. Has mentioned multiple times before and this admission that she wanted to die. Pain appears to have exacerbated patient's symptoms.  -start Celexa  -evaluate daily  # O2 requirement?: Initially required O2 in ED after being given pain medications for O2 sat 86%.  Meds have been deescalated since then so I am unsure as to why she has an O2 requirement now. No home O2 need at baseline. CXR with vascular congestion and bilateral atelectasis - Wean O2 as tolerated -incentive spirometry  #Nasal and maxillary fractures.  ENT assessed and did not find any significant deformity. Likely will not need any surgical intervention at the moment. --Follow up with ENT as an outpatient in one week .  #Left Patella fracture s/p repair on 5/30  Patient was seen by Dr.Xu (orthopedic surgeon) who recommended the following: --Weight-bearing as tolerated in Bledsoe brace or KI --Recommended SNF --oxy IR for pain with scheduled tylenol and prn ibuprofen  #Elevated troponin Likely stress related, Echo showed an EF of 60-65% with normal  systolic function and normal wall motion with G1DD. Patient has been seen in consultation by cardiology who felt no further cardiac workup needed at this time with outpatient follow-up. --Continue aspirin 325 mg daily  #Diabetes mellitus, uncontrolled.  Last A1c 9.5 01/06/2017. BGs 65-180s over last 24h. --Continue to hold oral hypoglycemic agents --Continue resistant SSI w/ scheduled Novolog 5 units TID w/ meals.  #ARF, resolved This morning creatinine is 0.69.  --Will continue to monitor  #Hypertension, well controlled  BP 122/34 this am. --Continue Norvasc 10 mg daily, Lisinopril   #Hyperlipidemia --Continue Zocor and Fenofibrate.  #Osteoporosis --Continue Fosamax and vitamin D  FEN/GI: Heart healthy, carb modified PPx: SCD  Disposition: Patient is medically stable and ready for SNF placement  Subjective:  No interpretor available and no family at bedside. Did use cardboard interpretation signs at bedside. Patient states she is in pain.   Objective: Temp:  [98.4 F (36.9 C)-99.4 F (37.4 C)] 99.4 F (37.4 C) (06/03 0432) Pulse Rate:  [66-77] 77 (06/03 0432) Resp:  [19] 19 (06/02 1300) BP: (127-186)/(40-56) 127/40 (06/03 0432) SpO2:  [94 %-98 %] 94 % (06/03 0432)  Physical Exam: General: facial excoriations that appear to be well healing.  Appears in pain, able to answer questions Cardiac: RRR, normal heart sounds, no murmurs. 2+ radial and PT pulses bilaterally Respiratory: CTAB, normal effort, No wheezes, rales or rhonchi; Bassett in place Abdomen: obese, soft, nontender, nondistended, no hepatic or splenomegaly, +BS Extremities: bilateral forearm wrap in ace bandage, and left leg with brace; right dorsum of foot with peripheral IV in place. Skin: warm and dry, excoriations as above Neuro: no focal deficits  Laboratory:  Recent  Labs Lab 04/01/17 0556 04/02/17 0804 04/03/17 0649  WBC 12.6* 10.1 8.6  HGB 9.9* 9.4* 9.2*  HCT 32.2* 29.2* 30.0*  PLT 165 187 194     Recent Labs Lab 03/31/17 0846 04/01/17 0556 04/03/17 0649  NA 138 137 135  K 3.8 4.3 3.4*  CL 103 104 100*  CO2 29 23 26   BUN 21* 17 21*  CREATININE 0.84 0.82 0.69  CALCIUM 7.9* 7.8* 8.1*  GLUCOSE 219* 241* 186*    Imaging/Diagnostic Tests: Dg Chest 1 View  Result Date: 03/28/2017 CLINICAL DATA:  Legs gave out, patient fell, deformities to LEFT and RIGHT wrists, laceration to bridge of nose, LEFT knee abrasion EXAM: CHEST 1 VIEW COMPARISON:  07/02/2010 FINDINGS: Enlargement of cardiac silhouette with slight vascular congestion. Mediastinal contours normal. Interval removal of Port-A-Cath. Atherosclerotic calcification aorta. Mild bibasilar atelectasis versus infiltrate. Upper lungs clear. No pleural effusion or pneumothorax. Bones demineralized. IMPRESSION: Enlargement of cardiac silhouette with pulmonary vascular congestion. Mild bibasilar atelectasis versus infiltrate. Electronically Signed   By: Lavonia Dana M.D.   On: 03/28/2017 18:27   Dg Wrist Complete Left  Result Date: 03/28/2017 CLINICAL DATA:  76 year old female with history of trauma from a fall complaining of pain in the left wrist. EXAM: LEFT WRIST - COMPLETE 3+ VIEW COMPARISON:  Left hand radiograph 06/13/2009. FINDINGS: Highly comminuted fractures of the distal radius and ulna are noted. The radial fracture is highly comminuted, intra-articular, impacted and displaced, with extension of the fracture through the metaphyseal region into the distal diaphyseal region. Individual fracture fragments are displaced in a random distribution, generally extending outward from the distal radius. There is some very mild volar angulation on the cross-table lateral view (approximately 15-20 degrees). Distal ulnar fracture is also highly comminuted, with radial displacement of the distal ulna approximately 1 to 1-1/2 shaft width. There also appears to be some distal migration of the proximal ulnar fragment into the overlying soft tissues  which are markedly swollen. The carpal some cells appear intact. IMPRESSION: 1. Highly comminuted, displaced and angulated intra-articular fractures of the distal radius and ulna, as detailed above. Electronically Signed   By: Vinnie Langton M.D.   On: 03/28/2017 18:27   Dg Wrist Complete Right  Result Date: 03/28/2017 CLINICAL DATA:  Recent fall with wrist pain, initial encounter EXAM: RIGHT WRIST - COMPLETE 3+ VIEW COMPARISON:  06/11/2009 FINDINGS: Fixation sideplate is noted along the distal radius in an area of prior fracture. A new fracture is noted in the radial metaphysis with posterior angulation. Additionally there is fracture of the fixation sideplate distally at the area of fracture. Deformity of the distal ulna is noted related to prior fracture and healing. No other are seen. IMPRESSION: Changes consistent with previous healed radial and ulnar fractures with surgical fixation. New radial metaphyseal fracture with posterior angulation and fracture of the fixation sideplate. Electronically Signed   By: Inez Catalina M.D.   On: 03/28/2017 18:26   Ct Head Wo Contrast  Result Date: 03/28/2017 CLINICAL DATA:  Recent fall with facial injury and headaches, initial encounter EXAM: CT HEAD WITHOUT CONTRAST CT MAXILLOFACIAL WITHOUT CONTRAST TECHNIQUE: Multidetector CT imaging of the head and maxillofacial structures were performed using the standard protocol without intravenous contrast. Multiplanar CT image reconstructions of the maxillofacial structures were also generated. COMPARISON:  06/11/2009 FINDINGS: CT HEAD FINDINGS Brain: Diffuse atrophic changes are noted. No findings to suggest acute hemorrhage, acute infarction or space-occupying mass lesion are seen. Areas of chronic white matter ischemic change are noted  stable from the prior exam. Vascular: No hyperdense vessel or unexpected calcification. Skull: The skull is intact. Multiple fractures are noted involving the nasal bones as well as the  maxillary antra bilaterally. These will be better evaluated on the maxillofacial CT. Other: Air-fluid levels are noted in the maxillary antra bilaterally. CT MAXILLOFACIAL FINDINGS Osseous: Bilateral nasal bone fractures are noted with mild impaction. Fractures of the pterygoid plates are noted with mild displacement bilaterally. Fracture lines are also noted within the anterior and lateral walls of the maxillary antra bilaterally. Involvement of the posterior walls of the maxillary antra are noted as well. No definitive orbital fracture is seen. No muscular entrapment noted. Zygomatic arches are within normal limits. Orbits: The orbits and their contents are within normal limits. Sinuses: Considerable soft tissue swelling is noted within the nasal passages. Air-fluid levels are noted within the maxillary antra bilaterally. Soft tissues: Mild soft tissue swelling is noted in the right forehead. No sizable hematoma seen. No other soft tissue abnormality is noted. IMPRESSION: CT of the maxillofacial bones: Multiple fractures involving the maxillary antra bilaterally as well as the pterygoid plates bilaterally. No definitive orbital involvement is noted. Air-fluid levels within the maxillary antra bilaterally. CT of the head: Chronic changes without acute abnormality. Electronically Signed   By: Inez Catalina M.D.   On: 03/28/2017 17:53   Dg Knee Complete 4 Views Left  Result Date: 03/28/2017 CLINICAL DATA:  Recent fall with knee pain, initial encounter EXAM: LEFT KNEE - COMPLETE 4+ VIEW COMPARISON:  10/12/2016 FINDINGS: There is a transverse fracture through the midportion of the patella with mild distraction of the fracture fragments of approximately 1 cm. Overlying soft tissue swelling and small joint effusion is noted. No other fracture or dislocation is seen. IMPRESSION: Patellar fracture with small joint effusion. No other bony abnormality is seen. Electronically Signed   By: Inez Catalina M.D.   On: 03/28/2017  18:24   Ct Maxillofacial Wo Cm  Result Date: 03/28/2017 CLINICAL DATA:  Recent fall with facial injury and headaches, initial encounter EXAM: CT HEAD WITHOUT CONTRAST CT MAXILLOFACIAL WITHOUT CONTRAST TECHNIQUE: Multidetector CT imaging of the head and maxillofacial structures were performed using the standard protocol without intravenous contrast. Multiplanar CT image reconstructions of the maxillofacial structures were also generated. COMPARISON:  06/11/2009 FINDINGS: CT HEAD FINDINGS Brain: Diffuse atrophic changes are noted. No findings to suggest acute hemorrhage, acute infarction or space-occupying mass lesion are seen. Areas of chronic white matter ischemic change are noted stable from the prior exam. Vascular: No hyperdense vessel or unexpected calcification. Skull: The skull is intact. Multiple fractures are noted involving the nasal bones as well as the maxillary antra bilaterally. These will be better evaluated on the maxillofacial CT. Other: Air-fluid levels are noted in the maxillary antra bilaterally. CT MAXILLOFACIAL FINDINGS Osseous: Bilateral nasal bone fractures are noted with mild impaction. Fractures of the pterygoid plates are noted with mild displacement bilaterally. Fracture lines are also noted within the anterior and lateral walls of the maxillary antra bilaterally. Involvement of the posterior walls of the maxillary antra are noted as well. No definitive orbital fracture is seen. No muscular entrapment noted. Zygomatic arches are within normal limits. Orbits: The orbits and their contents are within normal limits. Sinuses: Considerable soft tissue swelling is noted within the nasal passages. Air-fluid levels are noted within the maxillary antra bilaterally. Soft tissues: Mild soft tissue swelling is noted in the right forehead. No sizable hematoma seen. No other soft tissue abnormality is noted.  IMPRESSION: CT of the maxillofacial bones: Multiple fractures involving the maxillary antra  bilaterally as well as the pterygoid plates bilaterally. No definitive orbital involvement is noted. Air-fluid levels within the maxillary antra bilaterally. CT of the head: Chronic changes without acute abnormality. Electronically Signed   By: Inez Catalina M.D.   On: 03/28/2017 17:53   Katheren Shams, DO 04/04/2017, 8:05 AM PGY-3, [place] Intern pager: [phone], text pages welcome

## 2017-04-04 NOTE — Progress Notes (Signed)
Telemetry is discontinued; order in the system.

## 2017-04-05 ENCOUNTER — Encounter (HOSPITAL_COMMUNITY): Payer: Self-pay | Admitting: *Deleted

## 2017-04-05 DIAGNOSIS — I10 Essential (primary) hypertension: Secondary | ICD-10-CM

## 2017-04-05 DIAGNOSIS — M8000XD Age-related osteoporosis with current pathological fracture, unspecified site, subsequent encounter for fracture with routine healing: Secondary | ICD-10-CM

## 2017-04-05 LAB — GLUCOSE, CAPILLARY
GLUCOSE-CAPILLARY: 243 mg/dL — AB (ref 65–99)
GLUCOSE-CAPILLARY: 156 mg/dL — AB (ref 65–99)
Glucose-Capillary: 109 mg/dL — ABNORMAL HIGH (ref 65–99)
Glucose-Capillary: 255 mg/dL — ABNORMAL HIGH (ref 65–99)

## 2017-04-05 MED ORDER — ACETAMINOPHEN 325 MG PO TABS: 650.0000 mg | ORAL_TABLET | Freq: Four times a day (QID) | ORAL | 0 refills | Status: AC | PRN

## 2017-04-05 MED ORDER — OXYCODONE HCL 5 MG PO TABS: 5.0000 mg | ORAL_TABLET | ORAL | 0 refills | Status: DC | PRN

## 2017-04-05 MED ORDER — CITALOPRAM HYDROBROMIDE 20 MG PO TABS: 20.0000 mg | ORAL_TABLET | Freq: Every day | ORAL | 0 refills | Status: DC

## 2017-04-05 MED ORDER — INSULIN DETEMIR 100 UNIT/ML ~~LOC~~ SOLN: 26.0000 [IU] | Freq: Every day | SUBCUTANEOUS | 0 refills | Status: DC

## 2017-04-05 MED ORDER — SENNA 8.6 MG PO TABS: 1.0000 | ORAL_TABLET | Freq: Every day | ORAL | 0 refills | Status: DC

## 2017-04-05 MED ORDER — ASPIRIN 81 MG PO TABS: 81.0000 mg | ORAL_TABLET | Freq: Every day | ORAL | 0 refills | Status: DC

## 2017-04-05 MED ORDER — AMLODIPINE BESYLATE 10 MG PO TABS: 10.0000 mg | ORAL_TABLET | Freq: Every day | ORAL | 0 refills | Status: DC

## 2017-04-05 NOTE — Social Work (Signed)
Clinical Social Worker facilitated patient discharge including contacting patient family and facility to confirm patient discharge plans.  Clinical information faxed to facility and family agreeable with plan.  CSW arranged ambulance transport via [facility] to [facility].  RN to call [phone] report prior to discharge.  Clinical Social Worker will sign off for now as social work intervention is no longer needed. Please consult [place] again if new need arises.   Elissa Hefty, LCSW Clinical Social Worker [phone]

## 2017-04-05 NOTE — Progress Notes (Signed)
Physical Therapy Treatment Patient Details Name: Christy [name] MRN: [id number] DOB: 11/24/1940 Today's Date: 04/05/2017    History of Present Illness 76 y.o. Serbian/Bosnian only speaking female with medical history significant of HTN, DM type II, osteoporosis, breast cancer; who present after having a fall. Patient was leaving church with her son when she states her left leg went out and she fell face forward onto the side of the road and sustained bilateral distal radius, patella, and facial fractures. Underwent bilateral wrist surgery on 03/29/16.     PT Comments    Pt is making good progress towards her goals. Pt maxAx2 for bed mobility, maxAx3 for 2 times sit>stand with bilateral platform walker. Pt able to sit EoB for 10 minutes and maintain sitting balance without assist. Pt requires skilled PT to continue to progress transfer and gait training and to improve strength and endurance to safely mobilize in her discharge environment.     Follow Up Recommendations  SNF;Supervision/Assistance - 24 hour     Equipment Recommendations  Other (comment) (TBA)    Recommendations for Other Services       Precautions / Restrictions Precautions Precautions: Fall Required Braces or Orthoses: Knee Immobilizer - Left;Other Brace/Splint Other Brace/Splint: Bledsoe Brace in 30 degrees flexion Restrictions Weight Bearing Restrictions: Yes (NWB) RUE Weight Bearing: Weight bear through elbow only LUE Weight Bearing: Weight bear through elbow only LLE Weight Bearing: Weight bearing as tolerated    Mobility  Bed Mobility Overal bed mobility: Needs Assistance Bed Mobility: Rolling     Supine to sit: Max assist;HOB elevated;+2 for physical assistance     General bed mobility comments: Pt required maxAx3 for trunk and LE management and pad hip scoot to EoB. Pt able to initiate movements but with NWB through wrists unable to pull her self up  Transfers Overall transfer level: Needs  assistance Equipment used: Right platform walker;Left platform walker Transfers: Sit to/from Stand Sit to Stand: From elevated surface;Max assist (+3 physical assist)         General transfer comment: Pt arms placed on bilateral platforms and pt instructed to push up through her LE while maxAx3 to bring to upright Pt able to stand at walker for a minute before sitting back down pt able to sit>stand once more and recliner placed behind her to sit down.             Balance Overall balance assessment: Needs assistance Sitting-balance support: No upper extremity supported (only right foot on floor) Sitting balance-Leahy Scale: Good Sitting balance - Comments: pt sat EoB for 10 minutes with no leaning    Standing balance support: Bilateral upper extremity supported Standing balance-Leahy Scale: Zero Standing balance comment: +3 assist to maintain standing for 2 x1 min                            Cognition Arousal/Alertness: Awake/alert Behavior During Therapy: WFL for tasks assessed/performed Overall Cognitive Status: Difficult to assess                                 General Comments: Appears WFL as interpreter pt appears cognitively intact             Pertinent Vitals/Pain Pain Assessment: Faces Faces Pain Scale: Hurts even more Pain Location: Both hands and L knee Pain Descriptors / Indicators: Constant;Discomfort;Grimacing;Guarding;Moaning Pain Intervention(s): Monitored during session;Repositioned  Pt SaO2 on room  air >92% O2 with activity. Pt not on O2 at home and O2 tubing leaving mark on her face so O2 removed, pt placed on continuous O2 monitoring and nursing notified.            PT Goals (current goals can now be found in the care plan section) Acute Rehab PT Goals PT Goal Formulation: With patient Time For Goal Achievement: 04/14/17 Potential to Achieve Goals: Fair Progress towards PT goals: Progressing toward goals     Frequency    Min 3X/week      PT Plan Current plan remains appropriate       AM-PAC PT "6 Clicks" Daily Activity  Outcome Measure  Difficulty turning over in bed (including adjusting bedclothes, sheets and blankets)?: Total Difficulty moving from lying on back to sitting on the side of the bed? : Total Difficulty sitting down on and standing up from a chair with arms (e.g., wheelchair, bedside commode, etc,.)?: Total Help needed moving to and from a bed to chair (including a wheelchair)?: Total Help needed walking in hospital room?: Total Help needed climbing 3-5 steps with a railing? : Total 6 Click Score: 6    End of Session   Activity Tolerance: Patient tolerated treatment well Patient left: with call bell/phone within reach;with bed alarm set;in chair Nurse Communication: Mobility status;Need for lift equipment PT Visit Diagnosis: Muscle weakness (generalized) (M62.81);Pain;Other abnormalities of gait and mobility (R26.89) Pain - Right/Left:  (bil UEs, left LE) Pain - part of body: Hand;Leg;Knee     Time: 0910-1000 PT Time Calculation (min) (ACUTE ONLY): 50 min  Charges:  $Therapeutic Activity: 38-52 mins                    G Codes:       Josephene Marrone B. Migdalia Dk PT, DPT Acute Rehabilitation  [phone] Pager [phone]     [place] 04/05/2017, 1:44 PM

## 2017-04-05 NOTE — Addendum Note (Signed)
Addendum  created 04/05/17 1403 by Myrtie Soman, MD   Sign clinical note

## 2017-04-05 NOTE — Care Management Note (Signed)
Case Management Note  Patient Details  Name: Taquanna Borras MRN: [id number] Date of Birth: [birth year]  Subjective/Objective:   Bilateral distal radius fracture, s/p repair,                  Action/Plan: Discharge Planning: Chart reviewed. NCM contacted dtr, Fenton Foy [phone]. States she is requesting [facility] SNF, made CSW aware.  Scheduled dc to SNF today.    PCP [name] MD  Expected Discharge Date:                 Expected Discharge Plan:  [place]  In-House Referral:  Clinical Social Work  Discharge planning Services  CM Consult  Post Acute Care Choice:  NA Choice offered to:  NA  DME Arranged:  N/A DME Agency:  NA  HH Arranged:  NA HH Agency:  NA  Status of Service:  Completed, signed off  If discussed at [place] of Stay Meetings, dates discussed:    Additional Comments:  Erenest Rasher, RN 04/05/2017, 2:29 PM

## 2017-04-05 NOTE — Care Management Important Message (Signed)
Important Message  Patient Details  Name: Christy [name] MRN: [id number] Date of Birth: 07/02/1941   Medicare Important Message Given:  Yes    Shelli Portilla [address] 04/05/2017, 12:07 PM

## 2017-04-05 NOTE — Discharge Instructions (Signed)
We have made some changes to your medication list while you were admitted: - Increase amlodipine (Norvasc) from 5mg  to 10mg  daily - Begin taking aspirin 81mg  daily - Begin taking Celexa 20mg  daily  Please continue taking all of your other medications as you have been.

## 2017-04-05 NOTE — Discharge Summary (Signed)
[facility] Discharge Summary  Patient name: Christy [name] Medical record number: [id number] Date of birth: 1941-01-21 Age: 76 y.o. Gender: female Date of Admission: 03/28/2017  Date of Discharge:  Admitting Physician: Norval Morton, MD  Primary Care Provider: [name], MD Consultants: Orthopedic Surgery,   Indication for Hospitalization: Mechanical fall with multiple fractures to face, upper and lower extremities  Discharge Diagnoses/Problem List:  Bilateral distal radius fracture s/p repair Left patellar fracture Nasal T2DM CKDIII Hypertension  Hyperlipidemia Osteoporosis  Disposition: SNF  Discharge Condition: Stable  Discharge Exam: General: facial excoriations that appear to be well healing, able to answer questions Cardiac: RRR, normal heart sounds, no murmurs. 2+ radial and PT pulses bilaterally Respiratory: CTAB, normal effort, No wheezes, rales or rhonchi; [place] in place Abdomen: obese, soft, nontender, nondistended, no hepatic or splenomegaly, +BS Extremities: bilateral forearm wrap in ace bandage, and left leg with brace; right dorsum of foot with peripheral IV in place. Skin: warm and dry, excoriations as above Neuro: no focal deficits  Brief Hospital Course:  Patient is a 76 yo female who presented after a mechanical fall on 5/28  with multiple fractures to face upper and lower extremities. Patient was seen by ENT who felt patient did not require surgery per their examination of facial and nasal fractuer and recommended follow up in the outpatient. Patient had bilateral distal radius fracture and underwent ORIF by Dr. Caralyn Guile on 5/28. Patient also underwent ORIF for left patella fracture by Dr.Xu on 5/30. Patient tolerated both operations well. On Admission patient alsohad elevated troponins for which Cardiology was consulted prior to durgery. patient had a bedside ECHO which showed normal LVEF and no wall abnormlaities, no  indications for STEMI seen on EKG. Patient had a mild AKI with a creatinine of 1.3 (baseline 1.2) which resolved with IVF. Patient was evaluated by palliative care during hospitalization to discuss goals of care but family was clear in their desire to continue full care. Patient was started on Celexa due to concerns for depression given traumatic events and long recovery road given age and other comorbidities. Patient will continue on current home regimen for T2DM, HTN, HLD, osteoporosis. Patient was evaluated by PT/OT who recommended SNF and CIR did not think that she would be a good candidate for inpatient rehab. Patient's pain is well controlled on current regimen. Ortho recommended Weight Bearing As Tolerated during the rehab period keeping in mind that patient uses a walker at baseline. Patient will follow up with ortho and ENT in the upcoming days to address progress.  Issues for Follow Up:  1. Follow up with ENT for Nasal and Maxillary fractures 2. Follow up with ortho for patellar and distal radius fractures 3. Follow up with PCP for T2DM, HTN and osteoporosis management  4. Resume Fosamax given extensive history of surgery.   5. Risk Stratification and myoview stress test after recovery   Significant Procedures: ECHO, left patellar ORIF, bilateral distal radius ORIF  Significant Labs and Imaging:   Recent Labs Lab 04/01/17 0556 04/02/17 0804 04/03/17 0649  WBC 12.6* 10.1 8.6  HGB 9.9* 9.4* 9.2*  HCT 32.2* 29.2* 30.0*  PLT 165 187 194    Recent Labs Lab 03/29/17 2012 03/30/17 1500 03/31/17 0846 04/01/17 0556 04/03/17 0649  NA 141 138 138 137 135  K 5.3* 3.8 3.8 4.3 3.4*  CL 108 102 103 104 100*  CO2 22 27 29 23 26   GLUCOSE 293* 226* 219* 241* 186*  BUN 38* 22*  21* 17 21*  CREATININE 1.17* 0.88 0.84 0.82 0.69  CALCIUM 8.7* 8.2* 7.9* 7.8* 8.1*   Dg Chest 1 View  Result Date: 03/28/2017 CLINICAL DATA:  Legs gave out, patient fell, deformities to LEFT and RIGHT wrists,  laceration to bridge of nose, LEFT knee abrasion EXAM: CHEST 1 VIEW COMPARISON:  07/02/2010 FINDINGS: Enlargement of cardiac silhouette with slight vascular congestion. Mediastinal contours normal. Interval removal of Port-A-Cath. Atherosclerotic calcification aorta. Mild bibasilar atelectasis versus infiltrate. Upper lungs clear. No pleural effusion or pneumothorax. Bones demineralized. IMPRESSION: Enlargement of cardiac silhouette with pulmonary vascular congestion. Mild bibasilar atelectasis versus infiltrate. Electronically Signed   By: Lavonia Dana M.D.   On: 03/28/2017 18:27   Dg Wrist Complete Left  Result Date: 03/28/2017 CLINICAL DATA:  76 year old female with history of trauma from a fall complaining of pain in the left wrist. EXAM: LEFT WRIST - COMPLETE 3+ VIEW COMPARISON:  Left hand radiograph 06/13/2009. FINDINGS: Highly comminuted fractures of the distal radius and ulna are noted. The radial fracture is highly comminuted, intra-articular, impacted and displaced, with extension of the fracture through the metaphyseal region into the distal diaphyseal region. Individual fracture fragments are displaced in a random distribution, generally extending outward from the distal radius. There is some very mild volar angulation on the cross-table lateral view (approximately 15-20 degrees). Distal ulnar fracture is also highly comminuted, with radial displacement of the distal ulna approximately 1 to 1-1/2 shaft width. There also appears to be some distal migration of the proximal ulnar fragment into the overlying soft tissues which are markedly swollen. The carpal some cells appear intact. IMPRESSION: 1. Highly comminuted, displaced and angulated intra-articular fractures of the distal radius and ulna, as detailed above. Electronically Signed   By: Vinnie Langton M.D.   On: 03/28/2017 18:27   Dg Wrist Complete Right  Result Date: 03/28/2017 CLINICAL DATA:  Recent fall with wrist pain, initial encounter  EXAM: RIGHT WRIST - COMPLETE 3+ VIEW COMPARISON:  06/11/2009 FINDINGS: Fixation sideplate is noted along the distal radius in an area of prior fracture. A new fracture is noted in the radial metaphysis with posterior angulation. Additionally there is fracture of the fixation sideplate distally at the area of fracture. Deformity of the distal ulna is noted related to prior fracture and healing. No other are seen. IMPRESSION: Changes consistent with previous healed radial and ulnar fractures with surgical fixation. New radial metaphyseal fracture with posterior angulation and fracture of the fixation sideplate. Electronically Signed   By: Inez Catalina M.D.   On: 03/28/2017 18:26   Ct Head Wo Contrast  Result Date: 03/28/2017 CLINICAL DATA:  Recent fall with facial injury and headaches, initial encounter EXAM: CT HEAD WITHOUT CONTRAST CT MAXILLOFACIAL WITHOUT CONTRAST TECHNIQUE: Multidetector CT imaging of the head and maxillofacial structures were performed using the standard protocol without intravenous contrast. Multiplanar CT image reconstructions of the maxillofacial structures were also generated. COMPARISON:  06/11/2009 FINDINGS: CT HEAD FINDINGS Brain: Diffuse atrophic changes are noted. No findings to suggest acute hemorrhage, acute infarction or space-occupying mass lesion are seen. Areas of chronic white matter ischemic change are noted stable from the prior exam. Vascular: No hyperdense vessel or unexpected calcification. Skull: The skull is intact. Multiple fractures are noted involving the nasal bones as well as the maxillary antra bilaterally. These will be better evaluated on the maxillofacial CT. Other: Air-fluid levels are noted in the maxillary antra bilaterally. CT MAXILLOFACIAL FINDINGS Osseous: Bilateral nasal bone fractures are noted with mild impaction. Fractures of  the pterygoid plates are noted with mild displacement bilaterally. Fracture lines are also noted within the anterior and  lateral walls of the maxillary antra bilaterally. Involvement of the posterior walls of the maxillary antra are noted as well. No definitive orbital fracture is seen. No muscular entrapment noted. Zygomatic arches are within normal limits. Orbits: The orbits and their contents are within normal limits. Sinuses: Considerable soft tissue swelling is noted within the nasal passages. Air-fluid levels are noted within the maxillary antra bilaterally. Soft tissues: Mild soft tissue swelling is noted in the right forehead. No sizable hematoma seen. No other soft tissue abnormality is noted. IMPRESSION: CT of the maxillofacial bones: Multiple fractures involving the maxillary antra bilaterally as well as the pterygoid plates bilaterally. No definitive orbital involvement is noted. Air-fluid levels within the maxillary antra bilaterally. CT of the head: Chronic changes without acute abnormality. Electronically Signed   By: Inez Catalina M.D.   On: 03/28/2017 17:53   Dg Knee Complete 4 Views Left  Result Date: 03/28/2017 CLINICAL DATA:  Recent fall with knee pain, initial encounter EXAM: LEFT KNEE - COMPLETE 4+ VIEW COMPARISON:  10/12/2016 FINDINGS: There is a transverse fracture through the midportion of the patella with mild distraction of the fracture fragments of approximately 1 cm. Overlying soft tissue swelling and small joint effusion is noted. No other fracture or dislocation is seen. IMPRESSION: Patellar fracture with small joint effusion. No other bony abnormality is seen. Electronically Signed   By: Inez Catalina M.D.   On: 03/28/2017 18:24   Ct Maxillofacial Wo Cm  Result Date: 03/28/2017 CLINICAL DATA:  Recent fall with facial injury and headaches, initial encounter EXAM: CT HEAD WITHOUT CONTRAST CT MAXILLOFACIAL WITHOUT CONTRAST TECHNIQUE: Multidetector CT imaging of the head and maxillofacial structures were performed using the standard protocol without intravenous contrast. Multiplanar CT image  reconstructions of the maxillofacial structures were also generated. COMPARISON:  06/11/2009 FINDINGS: CT HEAD FINDINGS Brain: Diffuse atrophic changes are noted. No findings to suggest acute hemorrhage, acute infarction or space-occupying mass lesion are seen. Areas of chronic white matter ischemic change are noted stable from the prior exam. Vascular: No hyperdense vessel or unexpected calcification. Skull: The skull is intact. Multiple fractures are noted involving the nasal bones as well as the maxillary antra bilaterally. These will be better evaluated on the maxillofacial CT. Other: Air-fluid levels are noted in the maxillary antra bilaterally. CT MAXILLOFACIAL FINDINGS Osseous: Bilateral nasal bone fractures are noted with mild impaction. Fractures of the pterygoid plates are noted with mild displacement bilaterally. Fracture lines are also noted within the anterior and lateral walls of the maxillary antra bilaterally. Involvement of the posterior walls of the maxillary antra are noted as well. No definitive orbital fracture is seen. No muscular entrapment noted. Zygomatic arches are within normal limits. Orbits: The orbits and their contents are within normal limits. Sinuses: Considerable soft tissue swelling is noted within the nasal passages. Air-fluid levels are noted within the maxillary antra bilaterally. Soft tissues: Mild soft tissue swelling is noted in the right forehead. No sizable hematoma seen. No other soft tissue abnormality is noted. IMPRESSION: CT of the maxillofacial bones: Multiple fractures involving the maxillary antra bilaterally as well as the pterygoid plates bilaterally. No definitive orbital involvement is noted. Air-fluid levels within the maxillary antra bilaterally. CT of the head: Chronic changes without acute abnormality. Electronically Signed   By: Inez Catalina M.D.   On: 03/28/2017 17:53   Results/Tests Pending at Time of Discharge: None  Discharge Medications:   Allergies as of 04/05/2017      Reactions   No Known Allergies       Medication List    TAKE these medications   acetaminophen 325 MG tablet Commonly known as:  TYLENOL Take 2 tablets (650 mg total) by mouth every 6 (six) hours as needed for mild pain.   alendronate 70 MG tablet Commonly known as:  FOSAMAX Take 1 tablet (70 mg total) by mouth every 7 (seven) days. Take with a full glass of water on an empty stomach. What changed:  when to take this  additional instructions   amLODipine 10 MG tablet Commonly known as:  NORVASC Take 1 tablet (10 mg total) by mouth daily. What changed:  medication strength  how much to take   aspirin 81 MG tablet Take 1 tablet (81 mg total) by mouth daily. Start taking on:  04/06/2017   citalopram 20 MG tablet Commonly known as:  CELEXA Take 1 tablet (20 mg total) by mouth daily. Start taking on:  04/06/2017   fenofibrate 48 MG tablet Commonly known as:  TRICOR Take 1 tablet (48 mg total) by mouth daily.   Fish Oil 1200 MG Caps Take 1,200 mg by mouth 2 (two) times daily.   furosemide 40 MG tablet Commonly known as:  LASIX Take 1 tablet (40 mg total) by mouth daily. What changed:  Another medication with the same name was removed. Continue taking this medication, and follow the directions you see here.   glimepiride 2 MG tablet Commonly known as:  AMARYL Take 1 tablet (2 mg total) by mouth daily before breakfast.   ibuprofen 800 MG tablet Commonly known as:  ADVIL,MOTRIN Take 800 mg by mouth every 8 (eight) hours as needed for fever or moderate pain.   insulin aspart 100 UNIT/ML injection Commonly known as:  novoLOG Inject 30 Units into the skin 2 (two) times daily with a meal.   insulin detemir 100 UNIT/ML injection Commonly known as:  LEVEMIR Inject 0.26 mLs (26 Units total) into the skin at bedtime.   lisinopril 40 MG tablet Commonly known as:  PRINIVIL,ZESTRIL Take 1 tablet (40 mg total) by mouth daily.   metFORMIN  500 MG 24 hr tablet Commonly known as:  GLUCOPHAGE-XR Take 2 tablets (1,000 mg total) by mouth 2 (two) times daily.   oxyCODONE 5 MG immediate release tablet Commonly known as:  Oxy IR/ROXICODONE Take 1-2 tablets (5-10 mg total) by mouth every 4 (four) hours as needed for moderate pain or severe pain.   potassium chloride 10 MEQ tablet Commonly known as:  K-DUR Take 1 tablet (10 mEq total) by mouth 2 (two) times daily.   senna 8.6 MG Tabs tablet Commonly known as:  SENOKOT Take 1 tablet (8.6 mg total) by mouth daily. Start taking on:  04/06/2017   simvastatin 40 MG tablet Commonly known as:  ZOCOR TAKE 1 TABLET BY MOUTH ONCE DAILY   solifenacin 10 MG tablet Commonly known as:  VESICARE Take 1 tablet (10 mg total) by mouth daily.   Vitamin D 2000 units tablet Take 2,000 Units by mouth daily.       Discharge Instructions: Please refer to Patient Instructions section of EMR for full details.  Patient was counseled important signs and symptoms that should prompt return to medical care, changes in medications, dietary instructions, activity restrictions, and follow up appointments.   Follow-Up Appointments:  Contact information for follow-up providers    Leandrew Koyanagi, MD In 2  weeks.   Specialty:  Orthopedic Surgery Why:  For suture removal, For wound re-check Contact information: [place] [id number] [phone]            Contact information for after-discharge care    Destination    [facility] .   Specialty:  [place] information: [place]                  [name], MD 04/05/2017, 4:46 PM PGY-1, [place]

## 2017-04-05 NOTE — Clinical Social Work Placement (Signed)
   CLINICAL SOCIAL WORK PLACEMENT  NOTE  Date:  04/05/2017  Patient Details  Name: Christy [name] MRN: [id number] Date of Birth: 10-19-1941  Clinical Social Work is seeking post-discharge placement for this patient at the [place] level of care (*CSW will initial, date and re-position this form in  chart as items are completed):  Yes   Patient/family provided with [place] Work Department's list of facilities offering this level of care within the geographic area requested by the patient (or if unable, by the patient's family).  Yes   Patient/family informed of their freedom to choose among providers that offer the needed level of care, that participate in Medicare, Medicaid or managed care program needed by the patient, have an available bed and are willing to accept the patient.  Yes   Patient/family informed of 's ownership interest in [facility] and [facility], as well as of the fact that they are under no obligation to receive care at these facilities.  PASRR submitted to EDS on       PASRR number received on 04/02/17     Existing PASRR number confirmed on 04/02/17     FL2 transmitted to all facilities in geographic area requested by pt/family on 04/02/17     FL2 transmitted to all facilities within larger geographic area on 04/02/17     Patient informed that his/her managed care company has contracts with or will negotiate with certain facilities, including the following:        Yes   Patient/family informed of bed offers received.  Patient [facility] bed at [facility]     Physician recommends and patient chooses bed at      Patient to be transferred to [facility] on 04/05/17.  Patient to be transferred to facility by [facility]     Patient family notified on 04/05/17 of transfer.  Name of family member notified:  Ms. Buzzy Han     PHYSICIAN Please prepare  priority discharge summary, including medications, Please prepare prescriptions, Please sign FL2     Additional Comment:    _______________________________________________ Normajean Baxter, LCSW 04/05/2017, 4:49 PM

## 2017-04-05 NOTE — Progress Notes (Signed)
Family Medicine Teaching Service Daily Progress Note Intern Pager: [phone]  Patient name: Christy [name] Medical record number: [id number] Date of birth: 1941/03/01 Age: 76 y.o. Gender: female  Primary Care Provider: [name], MD Consultants: Cardiology, Hand surgery, Orthopedics Code Status: Full    Assessment and Plan:  #Bilateral distal radius fracture, s/p repair. Status postsurgical repair by Dr. Apolonio Schneiders 5/28. PT/OT recommended SNF placement with 24 hr assistance.  Patient's pain is not well controlled. --Continue oxycodone IR, scheduled tylenol, and prn ibuprofen --Scheduled bowel regimen --Continue Vit D supplementation. --Has h/o osteoporosis w/ Fosamax amongst her home medications.  Will ask ortho to guide as to when we can restart this medication in setting of acute fractures. --SNF placement  #Mood No history of depression. Not currently on therapy. Has mentioned multiple times before and this admission that she wanted to die. Pain appears to have exacerbated patient's symptoms.  --Continue Celexa 20 mg  Po daily  # New O2 requirement Initially required O2 in ED after being given pain medications for O2 sat 86%.  Meds have been deescalated since then so I am unsure as to why she has an O2 requirement now. No home O2 need at baseline. CXR with vascular congestion and bilateral atelectasis --Wean O2 as tolerated --incentive spirometry prn  #Nasal and maxillary fractures.  ENT assessed and did not find any significant deformity. Likely will not need any surgical intervention at the moment. --Follow up with ENT as an outpatient in one week .  #Left Patella fracture s/p repair on 5/30  Patient was seen by Dr.Xu (orthopedic surgeon) who recommended the following: --Weight-bearing as tolerated in Bledsoe brace or KI --Recommended SNF --oxy IR for pain with scheduled tylenol and prn ibuprofen  #Elevated troponin Likely stress related, Echo showed an EF of 60-65%  with normal systolic function and normal wall motion with G1DD. Patient has been seen in consultation by cardiology who felt no further cardiac workup needed at this time with outpatient follow-up. --Continue aspirin 325 mg daily  #Diabetes mellitus, uncontrolled.  Last A1c 9.5 01/06/2017. BGs 65-180s over last 24h. --Continue to hold oral hypoglycemic agents --Continue resistant SSI w/ scheduled Novolog 5 units TID w/ meals.  #ARF, resolved This morning creatinine is 0.69.  --Will continue to monitor  #Hypertension, well controlled  BP 122/34 this am. --Continue Norvasc 10 mg daily, Lisinopril   #Hyperlipidemia --Continue Zocor and Fenofibrate.  #Osteoporosis --Continue Fosamax and vitamin D  FEN/GI: Heart healthy, carb modified PPx: SCD  Disposition: Patient is medically stable and ready for SNF placement  Subjective:  No interpreter available and no family at bedside, but patient seem comfortable and was eating breakfast with nurse assistant. Objective: Temp:  [98.4 F (36.9 C)-98.6 F (37 C)] 98.4 F (36.9 C) (06/04 0546) Pulse Rate:  [58-63] 63 (06/04 0546) Resp:  [16] 16 (06/04 0546) BP: (123-137)/(32-46) 137/41 (06/04 0546) SpO2:  [90 %-96 %] 95 % (06/04 0546)  Physical Exam: General: facial excoriations that appear to be well healing.  Appears in pain, able to answer questions Cardiac: RRR, normal heart sounds, no murmurs. 2+ radial and PT pulses bilaterally Respiratory: CTAB, normal effort, No wheezes, rales or rhonchi; Stratton in place Abdomen: obese, soft, nontender, nondistended, no hepatic or splenomegaly, +BS Extremities: bilateral forearm wrap in ace bandage, and left leg with brace; right dorsum of foot with peripheral IV in place. Skin: warm and dry, excoriations as above Neuro: no focal deficits  Laboratory:  Recent Labs Lab 04/01/17 0556 04/02/17 0804 04/03/17  0649  WBC 12.6* 10.1 8.6  HGB 9.9* 9.4* 9.2*  HCT 32.2* 29.2* 30.0*  PLT 165 187 194     Recent Labs Lab 03/31/17 0846 04/01/17 0556 04/03/17 0649  NA 138 137 135  K 3.8 4.3 3.4*  CL 103 104 100*  CO2 29 23 26   BUN 21* 17 21*  CREATININE 0.84 0.82 0.69  CALCIUM 7.9* 7.8* 8.1*  GLUCOSE 219* 241* 186*    Imaging/Diagnostic Tests: Dg Chest 1 View  Result Date: 03/28/2017 CLINICAL DATA:  Legs gave out, patient fell, deformities to LEFT and RIGHT wrists, laceration to bridge of nose, LEFT knee abrasion EXAM: CHEST 1 VIEW COMPARISON:  07/02/2010 FINDINGS: Enlargement of cardiac silhouette with slight vascular congestion. Mediastinal contours normal. Interval removal of Port-A-Cath. Atherosclerotic calcification aorta. Mild bibasilar atelectasis versus infiltrate. Upper lungs clear. No pleural effusion or pneumothorax. Bones demineralized. IMPRESSION: Enlargement of cardiac silhouette with pulmonary vascular congestion. Mild bibasilar atelectasis versus infiltrate. Electronically Signed   By: Lavonia Dana M.D.   On: 03/28/2017 18:27   Dg Wrist Complete Left  Result Date: 03/28/2017 CLINICAL DATA:  76 year old female with history of trauma from a fall complaining of pain in the left wrist. EXAM: LEFT WRIST - COMPLETE 3+ VIEW COMPARISON:  Left hand radiograph 06/13/2009. FINDINGS: Highly comminuted fractures of the distal radius and ulna are noted. The radial fracture is highly comminuted, intra-articular, impacted and displaced, with extension of the fracture through the metaphyseal region into the distal diaphyseal region. Individual fracture fragments are displaced in a random distribution, generally extending outward from the distal radius. There is some very mild volar angulation on the cross-table lateral view (approximately 15-20 degrees). Distal ulnar fracture is also highly comminuted, with radial displacement of the distal ulna approximately 1 to 1-1/2 shaft width. There also appears to be some distal migration of the proximal ulnar fragment into the overlying soft tissues  which are markedly swollen. The carpal some cells appear intact. IMPRESSION: 1. Highly comminuted, displaced and angulated intra-articular fractures of the distal radius and ulna, as detailed above. Electronically Signed   By: Vinnie Langton M.D.   On: 03/28/2017 18:27   Dg Wrist Complete Right  Result Date: 03/28/2017 CLINICAL DATA:  Recent fall with wrist pain, initial encounter EXAM: RIGHT WRIST - COMPLETE 3+ VIEW COMPARISON:  06/11/2009 FINDINGS: Fixation sideplate is noted along the distal radius in an area of prior fracture. A new fracture is noted in the radial metaphysis with posterior angulation. Additionally there is fracture of the fixation sideplate distally at the area of fracture. Deformity of the distal ulna is noted related to prior fracture and healing. No other are seen. IMPRESSION: Changes consistent with previous healed radial and ulnar fractures with surgical fixation. New radial metaphyseal fracture with posterior angulation and fracture of the fixation sideplate. Electronically Signed   By: Inez Catalina M.D.   On: 03/28/2017 18:26   Ct Head Wo Contrast  Result Date: 03/28/2017 CLINICAL DATA:  Recent fall with facial injury and headaches, initial encounter EXAM: CT HEAD WITHOUT CONTRAST CT MAXILLOFACIAL WITHOUT CONTRAST TECHNIQUE: Multidetector CT imaging of the head and maxillofacial structures were performed using the standard protocol without intravenous contrast. Multiplanar CT image reconstructions of the maxillofacial structures were also generated. COMPARISON:  06/11/2009 FINDINGS: CT HEAD FINDINGS Brain: Diffuse atrophic changes are noted. No findings to suggest acute hemorrhage, acute infarction or space-occupying mass lesion are seen. Areas of chronic white matter ischemic change are noted stable from the prior exam. Vascular: No  hyperdense vessel or unexpected calcification. Skull: The skull is intact. Multiple fractures are noted involving the nasal bones as well as the  maxillary antra bilaterally. These will be better evaluated on the maxillofacial CT. Other: Air-fluid levels are noted in the maxillary antra bilaterally. CT MAXILLOFACIAL FINDINGS Osseous: Bilateral nasal bone fractures are noted with mild impaction. Fractures of the pterygoid plates are noted with mild displacement bilaterally. Fracture lines are also noted within the anterior and lateral walls of the maxillary antra bilaterally. Involvement of the posterior walls of the maxillary antra are noted as well. No definitive orbital fracture is seen. No muscular entrapment noted. Zygomatic arches are within normal limits. Orbits: The orbits and their contents are within normal limits. Sinuses: Considerable soft tissue swelling is noted within the nasal passages. Air-fluid levels are noted within the maxillary antra bilaterally. Soft tissues: Mild soft tissue swelling is noted in the right forehead. No sizable hematoma seen. No other soft tissue abnormality is noted. IMPRESSION: CT of the maxillofacial bones: Multiple fractures involving the maxillary antra bilaterally as well as the pterygoid plates bilaterally. No definitive orbital involvement is noted. Air-fluid levels within the maxillary antra bilaterally. CT of the head: Chronic changes without acute abnormality. Electronically Signed   By: Inez Catalina M.D.   On: 03/28/2017 17:53   Dg Knee Complete 4 Views Left  Result Date: 03/28/2017 CLINICAL DATA:  Recent fall with knee pain, initial encounter EXAM: LEFT KNEE - COMPLETE 4+ VIEW COMPARISON:  10/12/2016 FINDINGS: There is a transverse fracture through the midportion of the patella with mild distraction of the fracture fragments of approximately 1 cm. Overlying soft tissue swelling and small joint effusion is noted. No other fracture or dislocation is seen. IMPRESSION: Patellar fracture with small joint effusion. No other bony abnormality is seen. Electronically Signed   By: Inez Catalina M.D.   On: 03/28/2017  18:24   Ct Maxillofacial Wo Cm  Result Date: 03/28/2017 CLINICAL DATA:  Recent fall with facial injury and headaches, initial encounter EXAM: CT HEAD WITHOUT CONTRAST CT MAXILLOFACIAL WITHOUT CONTRAST TECHNIQUE: Multidetector CT imaging of the head and maxillofacial structures were performed using the standard protocol without intravenous contrast. Multiplanar CT image reconstructions of the maxillofacial structures were also generated. COMPARISON:  06/11/2009 FINDINGS: CT HEAD FINDINGS Brain: Diffuse atrophic changes are noted. No findings to suggest acute hemorrhage, acute infarction or space-occupying mass lesion are seen. Areas of chronic white matter ischemic change are noted stable from the prior exam. Vascular: No hyperdense vessel or unexpected calcification. Skull: The skull is intact. Multiple fractures are noted involving the nasal bones as well as the maxillary antra bilaterally. These will be better evaluated on the maxillofacial CT. Other: Air-fluid levels are noted in the maxillary antra bilaterally. CT MAXILLOFACIAL FINDINGS Osseous: Bilateral nasal bone fractures are noted with mild impaction. Fractures of the pterygoid plates are noted with mild displacement bilaterally. Fracture lines are also noted within the anterior and lateral walls of the maxillary antra bilaterally. Involvement of the posterior walls of the maxillary antra are noted as well. No definitive orbital fracture is seen. No muscular entrapment noted. Zygomatic arches are within normal limits. Orbits: The orbits and their contents are within normal limits. Sinuses: Considerable soft tissue swelling is noted within the nasal passages. Air-fluid levels are noted within the maxillary antra bilaterally. Soft tissues: Mild soft tissue swelling is noted in the right forehead. No sizable hematoma seen. No other soft tissue abnormality is noted. IMPRESSION: CT of the maxillofacial bones: Multiple  fractures involving the maxillary antra  bilaterally as well as the pterygoid plates bilaterally. No definitive orbital involvement is noted. Air-fluid levels within the maxillary antra bilaterally. CT of the head: Chronic changes without acute abnormality. Electronically Signed   By: Inez Catalina M.D.   On: 03/28/2017 17:53   [name], MD 04/05/2017, 2:03 PM PGY-3, [place] Intern pager: [phone], text pages welcome

## 2017-04-05 NOTE — Anesthesia Postprocedure Evaluation (Signed)
Anesthesia Post Note  Patient: Christy [name]  Procedure(s) Performed: Procedure(s) (LRB): LEFT PARTIAL PATELLECTOMY (Left)     Anesthesia Post Evaluation  Last Vitals:  Vitals:   04/04/17 1944 04/05/17 0546  BP: (!) 124/46 (!) 137/41  Pulse: (!) 58 63  Resp: 16 16  Temp: 37 C 36.9 C    Last Pain:  Vitals:   04/05/17 1128  TempSrc:   PainSc: Asleep                 [name] S

## 2017-04-06 ENCOUNTER — Telehealth: Payer: Self-pay | Admitting: Internal Medicine

## 2017-04-06 ENCOUNTER — Non-Acute Institutional Stay (SKILLED_NURSING_FACILITY): Payer: Medicare Other | Admitting: Internal Medicine

## 2017-04-06 ENCOUNTER — Encounter: Payer: Self-pay | Admitting: Internal Medicine

## 2017-04-06 DIAGNOSIS — Z967 Presence of other bone and tendon implants: Secondary | ICD-10-CM

## 2017-04-06 DIAGNOSIS — N390 Urinary tract infection, site not specified: Secondary | ICD-10-CM | POA: Diagnosis not present

## 2017-04-06 DIAGNOSIS — D649 Anemia, unspecified: Secondary | ICD-10-CM | POA: Diagnosis not present

## 2017-04-06 DIAGNOSIS — Z8781 Personal history of (healed) traumatic fracture: Secondary | ICD-10-CM | POA: Diagnosis not present

## 2017-04-06 DIAGNOSIS — N3281 Overactive bladder: Secondary | ICD-10-CM | POA: Diagnosis not present

## 2017-04-06 DIAGNOSIS — I10 Essential (primary) hypertension: Secondary | ICD-10-CM

## 2017-04-06 DIAGNOSIS — E1165 Type 2 diabetes mellitus with hyperglycemia: Secondary | ICD-10-CM | POA: Diagnosis not present

## 2017-04-06 DIAGNOSIS — S0292XD Unspecified fracture of facial bones, subsequent encounter for fracture with routine healing: Secondary | ICD-10-CM

## 2017-04-06 DIAGNOSIS — M8000XD Age-related osteoporosis with current pathological fracture, unspecified site, subsequent encounter for fracture with routine healing: Secondary | ICD-10-CM

## 2017-04-06 DIAGNOSIS — Z794 Long term (current) use of insulin: Secondary | ICD-10-CM | POA: Diagnosis not present

## 2017-04-06 DIAGNOSIS — Z9889 Other specified postprocedural states: Secondary | ICD-10-CM

## 2017-04-06 NOTE — Telephone Encounter (Signed)
Spoke with nurse at [facility] about urine culture with 20,000 colonies/ml of E. faecalis. She took verbal order for nitrofurantoin 100 mg BID x 5 days. Will call back if susceptibilities show resistance.   Olene Floss, MD [place], PGY-2

## 2017-04-06 NOTE — Progress Notes (Signed)
Patient ID: Christy [name], female   DOB: 19-Feb-1941, 76 y.o.   MRN: [id number]    HISTORY AND PHYSICAL   DATE: 04/06/2017  Location:    [facility] Room Number: 107 A Place of Service: SNF (31)   Extended Emergency Contact Information Primary Emergency Contact: Rasuo,Vedrana Address: [place], [place] Phone: [phone] Relation: Daughter Secondary Emergency Contact: Evenson,Radenko  [place] Phone: [phone] Relation: Son  Advanced Directive information Does Patient Have a Medical Advance Directive?: No, Would patient like information on creating a medical advance directive?: No - Patient declined  Chief Complaint  Patient presents with  . New Admit To SNF    Admission    HPI:  76 yo female seen today as a new admission into SNF following hospital stay for multiple fx s/p fall, AKI, leukocytosis, dehydration, DM, osteoporosis, HTN. She presented to the ED with multiple fx to face, upper and lower extremities. ENT consulted and no sx recommended for facial fx. Ortho took her to the OR on 03/29/17 for ORIF of b/l distal radius fx and on 03/31/17 for left patella fx ORIF (Dr Erlinda Hong). 2D echo revealed nml EF. A1c 9.5%; LDL 107; Cr 1.7-->0.69; glucose in 500s -->186; K 3.4; WBCs peaked 15.7K-->8.6K; Hgb 9.2 at d/c. She presents to SNF for short term rehab.  Today she reports no concerns. She speaks [place] and HPI limited due to language barrier. No nursing issues. No falls. Hx obtained from chart. Nursing rec'd call from hospital FP Resident to start pt on nitrofurantoin BID x 5 days for E faecalis UTI (20K colonies) noted on urine cx performed while she was admitted. She has a hx OAB. No hematuria noted by nursing  DM - uncontrolled. A1c 9.5%. Takes levemir 26 units qhs and Novolog 30 units BID. She also takes metformin and amaryl  Osteoporosis - she is s/p multiple fx 2/2 mechanical fall.  She takes fosamax weekly, Vitamin D daily.  Hyperlipidemia - stable on simvastatin and tricor. LDL 107  HTN - BP stable on lisinopril and amlodipine. She takes lasix with KCL  OAB - stable on vesicare  Hx breast CA - stable. No recurrence  Situational depression - recently started on celexa  Past Medical History:  Diagnosis Date  . Abnormal ECG    Inferior Q waves  . Cancer ([place])    stage II right breast cancer  . Cataract   . Diabetes mellitus   . Hypercholesterolemia   . Hyperlipidemia   . Hypertension   . Osteoporosis   . Overactive bladder     Past Surgical History:  Procedure Laterality Date  . BREAST SURGERY     right lumpectomy, sentinel node biopsy  . CATARACT EXTRACTION Bilateral 2008  . FRACTURE SURGERY     ORIF right distal radius fx  . JOINT REPLACEMENT     right hip  . ORIF RADIAL FRACTURE Bilateral 03/29/2017   Procedure: OPEN REDUCTION INTERNAL FIXATION (ORIF) RADIAL FRACTURES;  Surgeon: Iran Planas, MD;  Location: [facility];  Service: Orthopedics;  Laterality: Bilateral;  . PATELLECTOMY Left 03/31/2017   Procedure: LEFT PARTIAL PATELLECTOMY;  Surgeon: Leandrew Koyanagi, MD;  Location: [facility];  Service: Orthopedics;  Laterality: Left;  . PORT-A-CATH REMOVAL  09/11/2011   Procedure: REMOVAL PORT-A-CATH;  Surgeon: Rolm Bookbinder, MD;  Location: [facility];  Service: General;  Laterality: Left;  local   .  PORTACATH PLACEMENT      Patient Care Team: [name], MD as PCP - General (Family Medicine)  Social History   Social History  . Marital status: Widowed    Spouse name: N/A  . Number of children: N/A  . Years of education: N/A   Occupational History  . Retired    Social History Main Topics  . Smoking status: Former Smoker    Types: Cigarettes    Quit date: 12/27/2012  . Smokeless tobacco: Never Used     Comment: quit 2009  . Alcohol use No  . Drug use: No  . Sexual activity: No   Other Topics Concern  . Not on file    Social History Narrative   From [place], [employer] and development officer. She is [place] but can understand [name] and [place]. She has a son and daughter who help in her care.     reports that she quit smoking about 4 years ago. Her smoking use included Cigarettes. She has never used smokeless tobacco. She reports that she does not drink alcohol or use drugs.  Family History  Problem Relation Age of Onset  . Stroke Mother   . Stroke Father   . Hypertension Sister   . Stroke Sister   . Stroke Brother    Family Status  Relation Status  . Mother Deceased  . Father Deceased  . Sister Deceased  . Brother Deceased  . Brother Deceased    Immunization History  Administered Date(s) Administered  . Influenza Split 07/06/2014  . Influenza,inj,Quad PF,36+ Mos 10/02/2015  . Tdap 03/28/2017    Allergies  Allergen Reactions  . No Known Allergies     Medications: Patient's Medications  New Prescriptions   No medications on file  Previous Medications   ACETAMINOPHEN (TYLENOL) 325 MG TABLET    Take 2 tablets (650 mg total) by mouth every 6 (six) hours as needed for mild pain.   ALENDRONATE (FOSAMAX) 70 MG TABLET    Take 1 tablet (70 mg total) by mouth every 7 (seven) days. Take with a full glass of water on an empty stomach.   AMLODIPINE (NORVASC) 10 MG TABLET    Take 1 tablet (10 mg total) by mouth daily.   CHOLECALCIFEROL (VITAMIN D) 2000 UNITS TABLET    Take 2,000 Units by mouth daily.   CITALOPRAM (CELEXA) 20 MG TABLET    Take 1 tablet (20 mg total) by mouth daily.   FENOFIBRATE (TRICOR) 48 MG TABLET    Take 1 tablet (48 mg total) by mouth daily.   FUROSEMIDE (LASIX) 40 MG TABLET    Take 1 tablet (40 mg total) by mouth daily.   GLIMEPIRIDE (AMARYL) 2 MG TABLET    Take 1 tablet (2 mg total) by mouth daily before breakfast.   IBUPROFEN (ADVIL,MOTRIN) 800 MG TABLET    Take 800 mg by mouth every 8 (eight) hours as needed for fever or moderate pain.    INSULIN ASPART (NOVOLOG) 100 UNIT/ML  INJECTION    Inject 30 Units into the skin 2 (two) times daily with a meal.   INSULIN DETEMIR (LEVEMIR) 100 UNIT/ML INJECTION    Inject 0.26 mLs (26 Units total) into the skin at bedtime.   LISINOPRIL (PRINIVIL,ZESTRIL) 40 MG TABLET    Take 1 tablet (40 mg total) by mouth daily.   METFORMIN (GLUCOPHAGE-XR) 500 MG 24 HR TABLET    Take 2 tablets (1,000 mg total) by mouth 2 (two) times daily.   OMEGA-3 FATTY ACIDS (FISH OIL) 1200  MG CAPS    Take 1,200 mg by mouth 2 (two) times daily.   OXYCODONE (OXY IR/ROXICODONE) 5 MG IMMEDIATE RELEASE TABLET    Take 1-2 tablets (5-10 mg total) by mouth every 4 (four) hours as needed for moderate pain or severe pain.   POTASSIUM CHLORIDE (K-DUR,KLOR-CON) 10 MEQ TABLET    Take 10 mEq by mouth daily.   SENNA (SENOKOT) 8.6 MG TABS TABLET    Take 1 tablet (8.6 mg total) by mouth daily.   SIMVASTATIN (ZOCOR) 40 MG TABLET    TAKE 1 TABLET BY MOUTH ONCE DAILY   SOLIFENACIN (VESICARE) 10 MG TABLET    Take 1 tablet (10 mg total) by mouth daily.  Modified Medications   No medications on file  Discontinued Medications   ASPIRIN 81 MG TABLET    Take 1 tablet (81 mg total) by mouth daily.   POTASSIUM CHLORIDE (K-DUR) 10 MEQ TABLET    Take 1 tablet (10 mEq total) by mouth 2 (two) times daily.    Review of Systems  Unable to perform ROS: Other (language barrier)    Vitals:   04/06/17 0916  BP: 130/70  Pulse: 71  Resp: 20  Temp: 98.4 F (36.9 C)  TempSrc: Oral  SpO2: 90%   There is no height or weight on file to calculate BMI.  Physical Exam  Constitutional: She appears well-developed and well-nourished.  Sitting up in bed in NAD, [place] O2 intact  HENT:  Head: Head is with contusion.    Mouth/Throat: Oropharynx is clear and moist. No oropharyngeal exudate.  Eyes: Pupils are equal, round, and reactive to light. No scleral icterus.  Neck: Neck supple. Carotid bruit is not present. No tracheal deviation present. No thyromegaly present.  Cardiovascular: Normal  rate, regular rhythm and intact distal pulses.  Exam reveals no gallop and no friction rub.   Murmur (2/6 SEM) heard. No LE edema b/l. no calf TTP.   Pulmonary/Chest: Effort normal and breath sounds normal. No stridor. No respiratory distress. She has no wheezes. She has no rales.  Abdominal: Soft. Bowel sounds are normal. She exhibits no distension and no mass. There is no hepatomegaly. There is no tenderness. There is no rebound and no guarding.  obese  Musculoskeletal: She exhibits edema and tenderness.  B/l UE soft cast intact with FROM fingers and excellent capillary refill; left knee immobilizer/soft cast intact with FROM toes and excellent capillary refill  Lymphadenopathy:    She has no cervical adenopathy.  Neurological: She is alert.  Skin: Skin is warm and dry. No rash noted.  Multiple facial contusions  Psychiatric: She has a normal mood and affect. Her behavior is normal. Thought content normal.     Labs reviewed: Admission on 03/28/2017, Discharged on 04/05/2017  No results displayed because visit has over 200 results.  CBC Latest Ref Rng & Units 04/03/2017 04/02/2017 04/01/2017  WBC 4.0 - 10.5 K/uL 8.6 10.1 12.6(H)  Hemoglobin 12.0 - 15.0 g/dL 9.2(L) 9.4(L) 9.9(L)  Hematocrit 36.0 - 46.0 % 30.0(L) 29.2(L) 32.2(L)  Platelets 150 - 400 K/uL 194 187 165   CMP Latest Ref Rng & Units 04/03/2017 04/01/2017 03/31/2017  Glucose 65 - 99 mg/dL 186(H) 241(H) 219(H)  BUN 6 - 20 mg/dL 21(H) 17 21(H)  Creatinine 0.44 - 1.00 mg/dL 0.69 0.82 0.84  Sodium 135 - 145 mmol/L 135 137 138  Potassium 3.5 - 5.1 mmol/L 3.4(L) 4.3 3.8  Chloride 101 - 111 mmol/L 100(L) 104 103  CO2 22 - 32 mmol/L  26 23 29   Calcium 8.9 - 10.3 mg/dL 8.1(L) 7.8(L) 7.9(L)  Total Protein 6.1 - 8.1 g/dL - - -  Total Bilirubin 0.2 - 1.2 mg/dL - - -  Alkaline Phos 33 - 130 U/L - - -  AST 10 - 35 U/L - - -  ALT 6 - 29 U/L - - -   Lab Results  Component Value Date   HGBA1C 9.5 01/06/2017     Lab on 01/06/2017    Component Date Value Ref Range Status  . TSH 01/06/2017 3.73  mIU/L Final   Comment:   Reference Range   > or = 20 Years  0.40-4.50   Pregnancy Range First trimester  0.26-2.66 Second trimester 0.55-2.73 Third trimester  0.43-2.91     . WBC 01/06/2017 8.1  3.8 - 10.8 K/uL Final  . RBC 01/06/2017 4.35  3.80 - 5.10 MIL/uL Final  . Hemoglobin 01/06/2017 13.4  11.7 - 15.5 g/dL Final  . HCT 01/06/2017 41.2  35.0 - 45.0 % Final  . MCV 01/06/2017 94.7  80.0 - 100.0 fL Final  . MCH 01/06/2017 30.8  27.0 - 33.0 pg Final  . MCHC 01/06/2017 32.5  32.0 - 36.0 g/dL Final  . RDW 01/06/2017 13.8  11.0 - 15.0 % Final  . Platelets 01/06/2017 208  140 - 400 K/uL Final  . MPV 01/06/2017 11.4  7.5 - 12.5 fL Final  . Sodium 01/06/2017 140  135 - 146 mmol/L Final  . Potassium 01/06/2017 4.6  3.5 - 5.3 mmol/L Final  . Chloride 01/06/2017 103  98 - 110 mmol/L Final  . CO2 01/06/2017 26  20 - 31 mmol/L Final  . Glucose, Bld 01/06/2017 271* 65 - 99 mg/dL Final  . BUN 01/06/2017 16  7 - 25 mg/dL Final  . Creat 01/06/2017 1.07* 0.60 - 0.93 mg/dL Final   Comment:   For patients > or = 77 years of age: The upper reference limit for Creatinine is approximately 13% higher for people identified as African-American.     . Total Bilirubin 01/06/2017 0.4  0.2 - 1.2 mg/dL Final  . Alkaline Phosphatase 01/06/2017 53  33 - 130 U/L Final  . AST 01/06/2017 16  10 - 35 U/L Final  . ALT 01/06/2017 22  6 - 29 U/L Final  . Total Protein 01/06/2017 7.0  6.1 - 8.1 g/dL Final  . Albumin 01/06/2017 4.2  3.6 - 5.1 g/dL Final  . Calcium 01/06/2017 9.7  8.6 - 10.4 mg/dL Final  . GFR, Est African American 01/06/2017 58* >=60 mL/min Final  . GFR, Est Non African American 01/06/2017 51* >=60 mL/min Final  . Hemoglobin A1C 01/06/2017 9.5   Final  . Vit D, 25-Hydroxy 01/06/2017 30  30 - 100 ng/mL Final   Comment: Vitamin D Status           25-OH Vitamin D        Deficiency                <20 ng/mL        Insufficiency          20 - 29 ng/mL        Optimal             > or = 30 ng/mL   For 25-OH Vitamin D testing on patients on D2-supplementation and patients for whom quantitation of D2 and D3 fractions is required, the QuestAssureD 25-OH VIT D, (D2,D3), LC/MS/MS is recommended: order code [id number] (patients >  2 yrs).   . Cholesterol 01/06/2017 193  <200 mg/dL Final  . Triglycerides 01/06/2017 212* <150 mg/dL Final  . HDL 01/06/2017 44* >50 mg/dL Final  . Total CHOL/HDL Ratio 01/06/2017 4.4  <5.0 Ratio Final  . VLDL 01/06/2017 42* <30 mg/dL Final  . LDL Cholesterol 01/06/2017 107* <100 mg/dL Final    Dg Chest 1 View  Result Date: 03/28/2017 CLINICAL DATA:  Legs gave out, patient fell, deformities to LEFT and RIGHT wrists, laceration to bridge of nose, LEFT knee abrasion EXAM: CHEST 1 VIEW COMPARISON:  07/02/2010 FINDINGS: Enlargement of cardiac silhouette with slight vascular congestion. Mediastinal contours normal. Interval removal of Port-A-Cath. Atherosclerotic calcification aorta. Mild bibasilar atelectasis versus infiltrate. Upper lungs clear. No pleural effusion or pneumothorax. Bones demineralized. IMPRESSION: Enlargement of cardiac silhouette with pulmonary vascular congestion. Mild bibasilar atelectasis versus infiltrate. Electronically Signed   By: Lavonia Dana M.D.   On: 03/28/2017 18:27   Dg Wrist Complete Left  Result Date: 03/28/2017 CLINICAL DATA:  76 year old female with history of trauma from a fall complaining of pain in the left wrist. EXAM: LEFT WRIST - COMPLETE 3+ VIEW COMPARISON:  Left hand radiograph 06/13/2009. FINDINGS: Highly comminuted fractures of the distal radius and ulna are noted. The radial fracture is highly comminuted, intra-articular, impacted and displaced, with extension of the fracture through the metaphyseal region into the distal diaphyseal region. Individual fracture fragments are displaced in a random distribution, generally extending outward from the distal radius. There is  some very mild volar angulation on the cross-table lateral view (approximately 15-20 degrees). Distal ulnar fracture is also highly comminuted, with radial displacement of the distal ulna approximately 1 to 1-1/2 shaft width. There also appears to be some distal migration of the proximal ulnar fragment into the overlying soft tissues which are markedly swollen. The carpal some cells appear intact. IMPRESSION: 1. Highly comminuted, displaced and angulated intra-articular fractures of the distal radius and ulna, as detailed above. Electronically Signed   By: Vinnie Langton M.D.   On: 03/28/2017 18:27   Dg Wrist Complete Right  Result Date: 03/28/2017 CLINICAL DATA:  Recent fall with wrist pain, initial encounter EXAM: RIGHT WRIST - COMPLETE 3+ VIEW COMPARISON:  06/11/2009 FINDINGS: Fixation sideplate is noted along the distal radius in an area of prior fracture. A new fracture is noted in the radial metaphysis with posterior angulation. Additionally there is fracture of the fixation sideplate distally at the area of fracture. Deformity of the distal ulna is noted related to prior fracture and healing. No other are seen. IMPRESSION: Changes consistent with previous healed radial and ulnar fractures with surgical fixation. New radial metaphyseal fracture with posterior angulation and fracture of the fixation sideplate. Electronically Signed   By: Inez Catalina M.D.   On: 03/28/2017 18:26   Ct Head Wo Contrast  Result Date: 03/28/2017 CLINICAL DATA:  Recent fall with facial injury and headaches, initial encounter EXAM: CT HEAD WITHOUT CONTRAST CT MAXILLOFACIAL WITHOUT CONTRAST TECHNIQUE: Multidetector CT imaging of the head and maxillofacial structures were performed using the standard protocol without intravenous contrast. Multiplanar CT image reconstructions of the maxillofacial structures were also generated. COMPARISON:  06/11/2009 FINDINGS: CT HEAD FINDINGS Brain: Diffuse atrophic changes are noted. No  findings to suggest acute hemorrhage, acute infarction or space-occupying mass lesion are seen. Areas of chronic white matter ischemic change are noted stable from the prior exam. Vascular: No hyperdense vessel or unexpected calcification. Skull: The skull is intact. Multiple fractures are noted involving the nasal bones as  well as the maxillary antra bilaterally. These will be better evaluated on the maxillofacial CT. Other: Air-fluid levels are noted in the maxillary antra bilaterally. CT MAXILLOFACIAL FINDINGS Osseous: Bilateral nasal bone fractures are noted with mild impaction. Fractures of the pterygoid plates are noted with mild displacement bilaterally. Fracture lines are also noted within the anterior and lateral walls of the maxillary antra bilaterally. Involvement of the posterior walls of the maxillary antra are noted as well. No definitive orbital fracture is seen. No muscular entrapment noted. Zygomatic arches are within normal limits. Orbits: The orbits and their contents are within normal limits. Sinuses: Considerable soft tissue swelling is noted within the nasal passages. Air-fluid levels are noted within the maxillary antra bilaterally. Soft tissues: Mild soft tissue swelling is noted in the right forehead. No sizable hematoma seen. No other soft tissue abnormality is noted. IMPRESSION: CT of the maxillofacial bones: Multiple fractures involving the maxillary antra bilaterally as well as the pterygoid plates bilaterally. No definitive orbital involvement is noted. Air-fluid levels within the maxillary antra bilaterally. CT of the head: Chronic changes without acute abnormality. Electronically Signed   By: Inez Catalina M.D.   On: 03/28/2017 17:53   Dg Chest Port 1 View  Result Date: 04/03/2017 CLINICAL DATA:  Breast cancer, hypertension, diabetes, hypoxia EXAM: PORTABLE CHEST 1 VIEW COMPARISON:  03/08/2017 FINDINGS: Similar cardiomegaly with chronic vascular congestion and interstitial  prominence. Increased bibasilar atelectasis. No large effusion or pneumothorax. Degenerative changes of the spine. Aorta is atherosclerotic and ectatic. Bones are osteopenic. IMPRESSION: Similar cardiomegaly with chronic vascular congestion and interstitial prominence. Increased bibasilar atelectasis. Electronically Signed   By: Jerilynn Mages.  Shick M.D.   On: 04/03/2017 13:17   Dg Knee Complete 4 Views Left  Result Date: 03/28/2017 CLINICAL DATA:  Recent fall with knee pain, initial encounter EXAM: LEFT KNEE - COMPLETE 4+ VIEW COMPARISON:  10/12/2016 FINDINGS: There is a transverse fracture through the midportion of the patella with mild distraction of the fracture fragments of approximately 1 cm. Overlying soft tissue swelling and small joint effusion is noted. No other fracture or dislocation is seen. IMPRESSION: Patellar fracture with small joint effusion. No other bony abnormality is seen. Electronically Signed   By: Inez Catalina M.D.   On: 03/28/2017 18:24   Ct Maxillofacial Wo Cm  Result Date: 03/28/2017 CLINICAL DATA:  Recent fall with facial injury and headaches, initial encounter EXAM: CT HEAD WITHOUT CONTRAST CT MAXILLOFACIAL WITHOUT CONTRAST TECHNIQUE: Multidetector CT imaging of the head and maxillofacial structures were performed using the standard protocol without intravenous contrast. Multiplanar CT image reconstructions of the maxillofacial structures were also generated. COMPARISON:  06/11/2009 FINDINGS: CT HEAD FINDINGS Brain: Diffuse atrophic changes are noted. No findings to suggest acute hemorrhage, acute infarction or space-occupying mass lesion are seen. Areas of chronic white matter ischemic change are noted stable from the prior exam. Vascular: No hyperdense vessel or unexpected calcification. Skull: The skull is intact. Multiple fractures are noted involving the nasal bones as well as the maxillary antra bilaterally. These will be better evaluated on the maxillofacial CT. Other: Air-fluid  levels are noted in the maxillary antra bilaterally. CT MAXILLOFACIAL FINDINGS Osseous: Bilateral nasal bone fractures are noted with mild impaction. Fractures of the pterygoid plates are noted with mild displacement bilaterally. Fracture lines are also noted within the anterior and lateral walls of the maxillary antra bilaterally. Involvement of the posterior walls of the maxillary antra are noted as well. No definitive orbital fracture is seen. No muscular entrapment noted.  Zygomatic arches are within normal limits. Orbits: The orbits and their contents are within normal limits. Sinuses: Considerable soft tissue swelling is noted within the nasal passages. Air-fluid levels are noted within the maxillary antra bilaterally. Soft tissues: Mild soft tissue swelling is noted in the right forehead. No sizable hematoma seen. No other soft tissue abnormality is noted. IMPRESSION: CT of the maxillofacial bones: Multiple fractures involving the maxillary antra bilaterally as well as the pterygoid plates bilaterally. No definitive orbital involvement is noted. Air-fluid levels within the maxillary antra bilaterally. CT of the head: Chronic changes without acute abnormality. Electronically Signed   By: Inez Catalina M.D.   On: 03/28/2017 17:53     Assessment/Plan   ICD-10-CM   1. Urinary tract infection without hematuria, site unspecified -  E faecalis  N39.0   2. Type 2 diabetes mellitus with hyperglycemia, with long-term current use of insulin (HCC) E11.65    Z79.4   3. Osteoporosis with current pathological fracture with routine healing, unspecified osteoporosis type, subsequent encounter M80.00XD   4. Essential hypertension I10   5. OAB (overactive bladder) N32.81   6. S/P ORIF (open reduction internal fixation) fracture Z96.7    Z87.81    left patella on 03/31/17; b/l distal radius 03/29/17  7. Closed fracture of facial bone with routine healing, unspecified facial bone, subsequent encounter S02.92XD   8.  Anemia, unspecified type D64.9     F/u with ENT for facial fx mx  F/u with Ortho for left knee ORIF/b/l radial ORIF mx  Nitrofurantoin 100mg  BID x 5 days to tx E faecalis UTI  Cont other meds as ordered  PT/OT/ST as ordered  Wound care as indicated  Fall precautions  GOAL: short term rehab and d/c home when medically appropriate. Communicated with pt and nursing.  Will follow  Arush Gatliff S. Perlie Gold  [facility] [address] [place], [place] [address] [phone] Cell (Monday-Friday 8 AM - 5 PM) [phone] After 5 PM and follow prompts

## 2017-04-07 ENCOUNTER — Other Ambulatory Visit: Payer: Self-pay | Admitting: *Deleted

## 2017-04-07 LAB — URINE CULTURE

## 2017-04-07 MED ORDER — OXYCODONE HCL 5 MG PO TABS: ORAL_TABLET | ORAL | 0 refills | Status: DC

## 2017-04-07 NOTE — Telephone Encounter (Signed)
[facility] #: [phone] Fax#: [phone]  

## 2017-04-09 LAB — CBC AND DIFFERENTIAL
HEMATOCRIT: 33 — AB (ref 36–46)
Hemoglobin: 11.2 — AB (ref 12.0–16.0)
NEUTROS ABS: 17
PLATELETS: 391 (ref 150–399)
WBC: 18.2

## 2017-04-11 ENCOUNTER — Encounter (HOSPITAL_COMMUNITY): Payer: Self-pay | Admitting: Orthopedic Surgery

## 2017-04-11 NOTE — OR Nursing (Signed)
LATE ENTRY: Corrected quantity of implanted screw.  Verified with RN circulator signed PO paperwork.

## 2017-04-12 ENCOUNTER — Non-Acute Institutional Stay (SKILLED_NURSING_FACILITY): Payer: Medicare Other | Admitting: Adult Health

## 2017-04-12 ENCOUNTER — Encounter: Payer: Self-pay | Admitting: Adult Health

## 2017-04-12 DIAGNOSIS — J189 Pneumonia, unspecified organism: Secondary | ICD-10-CM

## 2017-04-12 NOTE — Progress Notes (Signed)
Location:   [facility] Room Number: 107 A Place of Service:  SNF (31)   CODE STATUS: Full Code  Allergies  Allergen Reactions  . No Known Allergies     Chief Complaint  Patient presents with  . Acute Visit    Elevated White blood count    HPI:  She has had a chest x-ray done which is questionable for pneumonia; her wbc is elevated indicating an infection. There are no reports of fever present. Her po intake has been poor. She does have rhonchi and rales present.    Past Medical History:  Diagnosis Date  . Abnormal ECG    Inferior Q waves  . Cancer ([place])    stage II right breast cancer  . Cataract   . Diabetes mellitus   . Hypercholesterolemia   . Hyperlipidemia   . Hypertension   . Osteoporosis   . Overactive bladder     Past Surgical History:  Procedure Laterality Date  . BREAST SURGERY     right lumpectomy, sentinel node biopsy  . CATARACT EXTRACTION Bilateral 2008  . FRACTURE SURGERY     ORIF right distal radius fx  . JOINT REPLACEMENT     right hip  . ORIF RADIAL FRACTURE Bilateral 03/29/2017   Procedure: OPEN REDUCTION INTERNAL FIXATION (ORIF) RADIAL FRACTURES;  Surgeon: Iran Planas, MD;  Location: [facility];  Service: Orthopedics;  Laterality: Bilateral;  . PATELLECTOMY Left 03/31/2017   Procedure: LEFT PARTIAL PATELLECTOMY;  Surgeon: Leandrew Koyanagi, MD;  Location: [facility];  Service: Orthopedics;  Laterality: Left;  . PORT-A-CATH REMOVAL  09/11/2011   Procedure: REMOVAL PORT-A-CATH;  Surgeon: Rolm Bookbinder, MD;  Location: [facility];  Service: General;  Laterality: Left;  local   . PORTACATH PLACEMENT      Social History   Social History  . Marital status: Widowed    Spouse name: N/A  . Number of children: N/A  . Years of education: N/A   Occupational History  . Retired    Social History Main Topics  . Smoking status: Former Smoker    Types: Cigarettes    Quit date: 12/27/2012  . Smokeless tobacco: Never Used   Comment: quit 2009  . Alcohol use No  . Drug use: No  . Sexual activity: No   Other Topics Concern  . Not on file   Social History Narrative   From [place], [employer] and development officer. She is [place] but can understand [name] and [place]. She has a son and daughter who help in her care.   Family History  Problem Relation Age of Onset  . Stroke Mother   . Stroke Father   . Hypertension Sister   . Stroke Sister   . Stroke Brother       VITAL SIGNS BP 116/60   Pulse (!) 58   Temp 99.2 F (37.3 C)   Resp 20   Ht 5\' 7"  (1.702 m)   Wt 219 lb (99.3 kg)   SpO2 92%   BMI 34.30 kg/m   Patient's Medications  New Prescriptions   No medications on file  Previous Medications   ACETAMINOPHEN (TYLENOL) 325 MG TABLET    Take 2 tablets (650 mg total) by mouth every 6 (six) hours as needed for mild pain.   ALENDRONATE (FOSAMAX) 70 MG TABLET    Take 1 tablet (70 mg total) by mouth every 7 (seven) days. Take with a full glass of water on an empty stomach.   AMLODIPINE (NORVASC)  10 MG TABLET    Take 1 tablet (10 mg total) by mouth daily.   ASPIRIN EC 81 MG TABLET    Take 81 mg by mouth daily.   ATORVASTATIN (LIPITOR) 20 MG TABLET    Take 20 mg by mouth at bedtime.   CEFTRIAXONE (ROCEPHIN) 1 G INJECTION    Inject 1 g into the muscle daily. For 10 days   CHOLECALCIFEROL (VITAMIN D) 2000 UNITS TABLET    Take 2,000 Units by mouth daily.   CITALOPRAM (CELEXA) 20 MG TABLET    Take 1 tablet (20 mg total) by mouth daily.   DOCUSATE SODIUM (COLACE) 100 MG CAPSULE    Take 100 mg by mouth 2 (two) times daily. Hold for loose stool   FENOFIBRATE (TRICOR) 48 MG TABLET    Take 1 tablet (48 mg total) by mouth daily.   FUROSEMIDE (LASIX) 40 MG TABLET    Take 1 tablet (40 mg total) by mouth daily.   GLIMEPIRIDE (AMARYL) 2 MG TABLET    Take 1 tablet (2 mg total) by mouth daily before breakfast.   IBUPROFEN (ADVIL,MOTRIN) 800 MG TABLET    Take 800 mg by mouth every 8 (eight) hours as needed for fever or moderate  pain.    INSULIN ASPART (NOVOLOG) 100 UNIT/ML INJECTION    Inject 30 Units into the skin 2 (two) times daily with a meal.   INSULIN DETEMIR (LEVEMIR) 100 UNIT/ML INJECTION    Inject 0.26 mLs (26 Units total) into the skin at bedtime.   LISINOPRIL (PRINIVIL,ZESTRIL) 40 MG TABLET    Take 1 tablet (40 mg total) by mouth daily.   METFORMIN (GLUCOPHAGE-XR) 500 MG 24 HR TABLET    Take 2 tablets (1,000 mg total) by mouth 2 (two) times daily.   OMEGA-3 FATTY ACIDS (FISH OIL) 1200 MG CAPS    Take 1,200 mg by mouth daily.    OXYCODONE (OXY IR/ROXICODONE) 5 MG IMMEDIATE RELEASE TABLET    Give 1 tablet by mouth every 6 hours for pain. May give 5 mg every three hours as needed for breakthru pain   POTASSIUM CHLORIDE (K-DUR,KLOR-CON) 10 MEQ TABLET    Take 10 mEq by mouth daily.   SENNA (SENOKOT) 8.6 MG TABS TABLET    Take 1 tablet (8.6 mg total) by mouth daily.   SOLIFENACIN (VESICARE) 10 MG TABLET    Take 1 tablet (10 mg total) by mouth daily.  Modified Medications   No medications on file  Discontinued Medications   OXYCODONE (OXY IR/ROXICODONE) 5 MG IMMEDIATE RELEASE TABLET    Take one tablet by mouth every 4 hours as needed for pain   SIMVASTATIN (ZOCOR) 40 MG TABLET    TAKE 1 TABLET BY MOUTH ONCE DAILY     SIGNIFICANT DIAGNOSTIC EXAMS  04-09-17: chest x-ray: subsegmental atelectasis versus a slight infiltrate at the right lung base.   04-09-17: KUB: nonobstructive bowel gas pattern   LABS REVIEWED:   04-09-17:wbc 18.2; hgb 11.2; hct 33.1; mcv 93.4; plt 391; glucose 263; bun 33.7; creat 1.14; k+ 3.7; na++ 143; liver normal albumin 3.5  lactic acid 0.8 blood culture: #1 of 2: collection-associated skin organism    Review of Systems  Unable to perform ROS: Other (language barrier )      Physical Exam  Constitutional: She appears well-developed and well-nourished.  Mouth/Throat: Oropharynx is clear and moist. No oropharyngeal exudate.  Eyes: Pupils are equal, round, and reactive to light. No  scleral icterus.  Neck: Neck supple. Carotid bruit  is not present. No tracheal deviation present. No thyromegaly present.  Cardiovascular: Normal rate, regular rhythm and intact distal pulses.  Exam reveals no gallop and no friction rub.   Murmur (2/6 SEM) heard.  Pulmonary/Chest: Effort normal has rhonchi and rales present.02 dependent Abdominal: Soft. Bowel sounds are normal. She exhibits no distension and no mass. There is no hepatomegaly. There is no tenderness. There is no rebound and no guarding.  obese  Musculoskeletal: She exhibits edema and tenderness.  B/l UE soft cast intact with FROM fingers and excellent capillary refill; left knee immobilizer/soft cast intact with FROM toes and excellent capillary refill  Lymphadenopathy:    She has no cervical adenopathy.  Neurological: She is alert.  Skin: Skin is warm and dry. No rash noted.  Multiple facial contusions  Psychiatric: She has a normal mood and affect. Her behavior is normal. Thought content normal.      ASSESSMENT/ PLAN:  1. Pneumonia: will have her complete rocephin and will continue to monitor her status.    MD is aware of resident's narcotic use and is in agreement with current plan of care. We will attempt to wean resident as apropriate   Ok Edwards NP [facility]  Contact [phone] Monday through Friday 8am- 5pm  After hours call [phone]

## 2017-04-15 ENCOUNTER — Inpatient Hospital Stay (INDEPENDENT_AMBULATORY_CARE_PROVIDER_SITE_OTHER): Payer: Self-pay | Admitting: Orthopaedic Surgery

## 2017-04-15 ENCOUNTER — Encounter (HOSPITAL_COMMUNITY): Payer: Self-pay | Admitting: Nurse Practitioner

## 2017-04-15 ENCOUNTER — Emergency Department (HOSPITAL_COMMUNITY): Payer: Medicare Other

## 2017-04-15 ENCOUNTER — Emergency Department (HOSPITAL_COMMUNITY)
Admission: EM | Admit: 2017-04-15 | Discharge: 2017-04-15 | Disposition: A | Discharge status: Skilled Nursing Facility | Payer: Medicare Other | Attending: Emergency Medicine | Admitting: Emergency Medicine

## 2017-04-15 DIAGNOSIS — Z96641 Presence of right artificial hip joint: Secondary | ICD-10-CM | POA: Diagnosis not present

## 2017-04-15 DIAGNOSIS — Z794 Long term (current) use of insulin: Secondary | ICD-10-CM | POA: Diagnosis not present

## 2017-04-15 DIAGNOSIS — Z7982 Long term (current) use of aspirin: Secondary | ICD-10-CM | POA: Insufficient documentation

## 2017-04-15 DIAGNOSIS — I10 Essential (primary) hypertension: Secondary | ICD-10-CM | POA: Insufficient documentation

## 2017-04-15 DIAGNOSIS — E119 Type 2 diabetes mellitus without complications: Secondary | ICD-10-CM | POA: Diagnosis not present

## 2017-04-15 DIAGNOSIS — Z7984 Long term (current) use of oral hypoglycemic drugs: Secondary | ICD-10-CM | POA: Diagnosis not present

## 2017-04-15 DIAGNOSIS — E860 Dehydration: Secondary | ICD-10-CM | POA: Diagnosis not present

## 2017-04-15 DIAGNOSIS — R4182 Altered mental status, unspecified: Secondary | ICD-10-CM | POA: Diagnosis present

## 2017-04-15 DIAGNOSIS — Z87891 Personal history of nicotine dependence: Secondary | ICD-10-CM | POA: Insufficient documentation

## 2017-04-15 DIAGNOSIS — R938 Abnormal findings on diagnostic imaging of other specified body structures: Secondary | ICD-10-CM | POA: Insufficient documentation

## 2017-04-15 DIAGNOSIS — Z853 Personal history of malignant neoplasm of breast: Secondary | ICD-10-CM | POA: Diagnosis not present

## 2017-04-15 DIAGNOSIS — Z79899 Other long term (current) drug therapy: Secondary | ICD-10-CM | POA: Diagnosis not present

## 2017-04-15 LAB — URINALYSIS, ROUTINE W REFLEX MICROSCOPIC
Bilirubin Urine: NEGATIVE
GLUCOSE, UA: NEGATIVE mg/dL
HGB URINE DIPSTICK: NEGATIVE
KETONES UR: 5 mg/dL — AB
LEUKOCYTES UA: NEGATIVE
Nitrite: NEGATIVE
PH: 6 (ref 5.0–8.0)
PROTEIN: NEGATIVE mg/dL
Specific Gravity, Urine: 1.014 (ref 1.005–1.030)

## 2017-04-15 LAB — CBC
HCT: 33.5 % — ABNORMAL LOW (ref 36.0–46.0)
Hemoglobin: 10.8 g/dL — ABNORMAL LOW (ref 12.0–15.0)
MCH: 30.6 pg (ref 26.0–34.0)
MCHC: 32.2 g/dL (ref 30.0–36.0)
MCV: 94.9 fL (ref 78.0–100.0)
Platelets: 348 10*3/uL (ref 150–400)
RBC: 3.53 MIL/uL — ABNORMAL LOW (ref 3.87–5.11)
RDW: 13.4 % (ref 11.5–15.5)
WBC: 9 10*3/uL (ref 4.0–10.5)

## 2017-04-15 LAB — COMPREHENSIVE METABOLIC PANEL
ALT: 22 U/L (ref 14–54)
AST: 36 U/L (ref 15–41)
Albumin: 2.8 g/dL — ABNORMAL LOW (ref 3.5–5.0)
Alkaline Phosphatase: 84 U/L (ref 38–126)
Anion gap: 9 (ref 5–15)
BUN: 32 mg/dL — ABNORMAL HIGH (ref 6–20)
CO2: 28 mmol/L (ref 22–32)
Calcium: 8.5 mg/dL — ABNORMAL LOW (ref 8.9–10.3)
Chloride: 98 mmol/L — ABNORMAL LOW (ref 101–111)
Creatinine, Ser: 1.14 mg/dL — ABNORMAL HIGH (ref 0.44–1.00)
GFR calc Af Amer: 53 mL/min — ABNORMAL LOW (ref >60)
GFR calc non Af Amer: 46 mL/min — ABNORMAL LOW (ref >60)
Glucose, Bld: 137 mg/dL — ABNORMAL HIGH (ref 65–99)
Potassium: 5.7 mmol/L — ABNORMAL HIGH (ref 3.5–5.1)
Sodium: 135 mmol/L (ref 135–145)
Total Bilirubin: 2.1 mg/dL — ABNORMAL HIGH (ref 0.3–1.2)
Total Protein: 6.1 g/dL — ABNORMAL LOW (ref 6.5–8.1)

## 2017-04-15 LAB — CBG MONITORING, ED: Glucose-Capillary: 148 mg/dL — ABNORMAL HIGH (ref 65–99)

## 2017-04-15 MED ORDER — SODIUM CHLORIDE 0.9 % IV BOLUS (SEPSIS)
1000.0000 mL | Freq: Once | INTRAVENOUS | Status: AC
  Administered 2017-04-15: 1000 mL via INTRAVENOUS

## 2017-04-15 NOTE — ED Notes (Signed)
Attempted to call facility X2, no answer

## 2017-04-15 NOTE — ED Notes (Signed)
Patient transported to CT 

## 2017-04-15 NOTE — ED Triage Notes (Signed)
Per EMS pt from [facility] for rehab after fall- patient has left leg and bilateral arm casts. Patient is diabetic and noted to be increasingly lethargic- dozes off during conversation, decreased appetite in the past few days, and pain in legs. Patient does not speak [place]- from [place]. Daughter is 2 hours away   Manual BP 90/50

## 2017-04-15 NOTE — Discharge Instructions (Signed)
Continue taking your home medications as prescribed. Continue drinking fluids at home to remain hydrated. Suspect your symptoms that brought you into the emergency department today are likely related to mild dehydration and your recent orthopedic injuries along with the pain medications you are taking for your fractures.  I recommend following up with your orthopedist at your scheduled appointment for further management of your fractures. Follow-up with your primary care provider within the next 3-4 days for reevaluation. Return to the emergency department if symptoms worsen or new onset of fever, altered mental status, confusion, chest pain, difficult to breathing, abdominal pain, vomiting, CP, seizure

## 2017-04-15 NOTE — ED Provider Notes (Signed)
[facility] DEPT Provider Note   CSN: [id number] Arrival date & time: 04/15/17  1547     History   Chief Complaint Chief Complaint  Patient presents with  . Altered Mental Status    HPI Christy [name] is a 76 y.o. female.  HPI   Pt is a 76 yo female with PMH of HTN, HLD, DM, right breast cancer s/p lumpectomy who presents to the ED via EMS from [facility] with complaints of altered mental status.  EMS reports nursing facility state patient has been more lethargic over the past few days with a decreased appetite and notes she has been sleeping more. Of note patient was discharged from the hospital on 04/05/17 status post fall that resulted in bilateral distal radius fracture status post repair and left patellar fracture. Patient reports having intermittent pain in her left leg and hip have been present since her last fall. Patient notes she has been having intermittent cough. Denies fever, headache, chest pain, shortness of breath, abdominal pain, nausea, vomiting, urinary symptoms, swelling.  Pt speaks Bosnian, interpretor using during interview/exam.  Past Medical History:  Diagnosis Date  . Abnormal ECG    Inferior Q waves  . Cancer ([place])    stage II right breast cancer  . Cataract   . Diabetes mellitus   . Hypercholesterolemia   . Hyperlipidemia   . Hypertension   . Osteoporosis   . Overactive bladder     Patient Active Problem List   Diagnosis Date Noted  . Dehydration   . Displaced comminuted fracture of left patella, initial encounter for closed fracture 03/31/2017  . Elevated troponin   . Acute renal failure (ARF) ([place]) 03/29/2017  . Leukocytosis 03/29/2017  . Fall 03/29/2017  . Multiple fractures 03/28/2017  . Hyperlipidemia 12/27/2016  . Essential hypertension 12/27/2016  . Elevated serum creatinine 08/07/2014  . Elevated BUN 08/07/2014  . Uncontrolled type II diabetes mellitus ([place]) 08/07/2014  . Osteoporosis 08/07/2014  . Breast cancer of  upper-outer quadrant of right female breast ([place]) 08/07/2013  . Abnormal EKG 08/21/2011    Past Surgical History:  Procedure Laterality Date  . BREAST SURGERY     right lumpectomy, sentinel node biopsy  . CATARACT EXTRACTION Bilateral 2008  . FRACTURE SURGERY     ORIF right distal radius fx  . JOINT REPLACEMENT     right hip  . ORIF RADIAL FRACTURE Bilateral 03/29/2017   Procedure: OPEN REDUCTION INTERNAL FIXATION (ORIF) RADIAL FRACTURES;  Surgeon: Iran Planas, MD;  Location: [facility];  Service: Orthopedics;  Laterality: Bilateral;  . PATELLECTOMY Left 03/31/2017   Procedure: LEFT PARTIAL PATELLECTOMY;  Surgeon: Leandrew Koyanagi, MD;  Location: [facility];  Service: Orthopedics;  Laterality: Left;  . PORT-A-CATH REMOVAL  09/11/2011   Procedure: REMOVAL PORT-A-CATH;  Surgeon: Rolm Bookbinder, MD;  Location: [facility];  Service: General;  Laterality: Left;  local   . PORTACATH PLACEMENT      OB History    No data available       Home Medications    Prior to Admission medications   Medication Sig Start Date End Date Taking? Authorizing Provider  acetaminophen (TYLENOL) 325 MG tablet Take 2 tablets (650 mg total) by mouth every 6 (six) hours as needed for mild pain. 04/05/17  Yes Verner Mould, MD  alendronate (FOSAMAX) 70 MG tablet Take 1 tablet (70 mg total) by mouth every 7 (seven) days. Take with a full glass of water on an empty stomach. 12/31/16  Yes [name], [name], MD  amLODipine (NORVASC) 10 MG tablet Take 1 tablet (10 mg total) by mouth daily. 04/05/17  Yes Verner Mould, MD  aspirin EC 81 MG tablet Take 81 mg by mouth daily.   Yes [provider]  atorvastatin (LIPITOR) 20 MG tablet Take 20 mg by mouth at bedtime.   Yes [provider]  cefTRIAXone (ROCEPHIN) 1 g injection Inject 1 g into the muscle daily. For 10 days   Yes [provider]  Cholecalciferol (VITAMIN D) 2000 units tablet Take 2,000 Units by mouth daily.    Yes [provider]  citalopram (CELEXA) 20 MG tablet Take 1 tablet (20 mg total) by mouth daily. 04/06/17  Yes [name], [name], MD  docusate sodium (COLACE) 100 MG capsule Take 100 mg by mouth 2 (two) times daily. Hold for loose stool   Yes [provider]  fenofibrate (TRICOR) 48 MG tablet Take 1 tablet (48 mg total) by mouth daily. 12/31/16  Yes [name], [name], MD  furosemide (LASIX) 40 MG tablet Take 1 tablet (40 mg total) by mouth daily. 12/31/16  Yes [name], [name], MD  ibuprofen (ADVIL,MOTRIN) 800 MG tablet Take 800 mg by mouth every 8 (eight) hours as needed for fever or moderate pain.  02/28/13  Yes [provider]  insulin aspart (NOVOLOG) 100 UNIT/ML injection Inject 30 Units into the skin 2 (two) times daily with a meal.   Yes [provider]  insulin detemir (LEVEMIR) 100 UNIT/ML injection Inject 0.26 mLs (26 Units total) into the skin at bedtime. 04/05/17  Yes [name], [name], MD  lisinopril (PRINIVIL,ZESTRIL) 40 MG tablet Take 1 tablet (40 mg total) by mouth daily. 12/31/16  Yes [name], [name], MD  mirtazapine (REMERON) 7.5 MG tablet Take 7.5 mg by mouth at bedtime. FOR APPETITE   Yes [provider]  Omega-3 Fatty Acids (FISH OIL) 1200 MG CAPS Take 1,200 mg by mouth daily.    Yes [provider]  ondansetron (ZOFRAN) 4 MG tablet Take 4 mg by mouth every 6 (six) hours. FOR NAUSEA AND VOMITING   Yes [provider]  oxyCODONE (OXY IR/ROXICODONE) 5 MG immediate release tablet Take 5 mg by mouth every 6 (six) hours. AND MAY TAKE 5 MG EVERY THREE HOURS "AS NEEDED" FOR BREAKTHROUGH PAIN   Yes [provider]  potassium chloride (K-DUR,KLOR-CON) 10 MEQ tablet Take 10 mEq by mouth daily.   Yes [provider]  senna (SENOKOT) 8.6 MG TABS tablet Take 1 tablet (8.6 mg total) by mouth daily. 04/06/17  Yes [name], [name], MD  solifenacin (VESICARE) 10 MG tablet Take 1 tablet (10 mg total) by mouth daily. 12/31/16   Yes [name], Earna Coder, MD  UNABLE TO [place] 2.0/Mes pass: Drink 90 ml's by mouth three times a day for poor meal intake   Yes [provider]  glimepiride (AMARYL) 2 MG tablet Take 1 tablet (2 mg total) by mouth daily before breakfast. 12/31/16   [name], Earna Coder, MD  metFORMIN (GLUCOPHAGE-XR) 500 MG 24 hr tablet Take 2 tablets (1,000 mg total) by mouth 2 (two) times daily. 01/13/17   [name], MD    Family History Family History  Problem Relation Age of Onset  . Stroke Mother   . Stroke Father   . Hypertension Sister   . Stroke Sister   . Stroke Brother     Social History Social History  Substance Use Topics  . Smoking status: Former Smoker    Types: Cigarettes  Quit date: 12/27/2012  . Smokeless tobacco: Never Used     Comment: quit 2009  . Alcohol use No     Allergies   No known allergies   Review of Systems Review of Systems  Constitutional: Positive for appetite change (decreased) and fatigue.  Respiratory: Positive for cough.   All other systems reviewed and are negative.    Physical Exam Updated Vital Signs BP (!) 140/54   Pulse (!) 58   Temp 98.3 F (36.8 C) (Oral)   Resp 17   Ht 5\' 10"  (1.778 m)   Wt 99.8 kg (220 lb)   SpO2 100%   BMI 31.57 kg/m   Physical Exam  Constitutional: She is oriented to person, place, and time. She appears well-developed and well-nourished.  Obese elderly appearing female. Pt intermittently falling asleep throughout but easily arousable with verbal stimuli/light touch.  HENT:  Head: Normocephalic and atraumatic.  Right Ear: Tympanic membrane normal.  Left Ear: Tympanic membrane normal.  Mouth/Throat: Uvula is midline and oropharynx is clear and moist. Mucous membranes are dry. No oropharyngeal exudate, posterior oropharyngeal edema, posterior oropharyngeal erythema or tonsillar abscesses. No tonsillar exudate.  Eyes: Conjunctivae and EOM are normal. Right eye exhibits no discharge. Left eye exhibits  no discharge. No scleral icterus.  Neck: Normal range of motion. Neck supple.  Cardiovascular: Normal rate, regular rhythm, normal heart sounds and intact distal pulses.   Pulmonary/Chest: Effort normal and breath sounds normal. No respiratory distress. She has no wheezes. She has no rales. She exhibits no tenderness.  Abdominal: Soft. Bowel sounds are normal. She exhibits no distension and no mass. There is no tenderness. There is no rebound and no guarding.  Musculoskeletal: She exhibits no edema.  Splints present to BUE and left leg with knee immobilizer in place. Equal grip strength bilaterally. FROM of right leg. Pt able to wiggle left toes. Sensation grossly intact. 2+ DP pulses. Cap refill <2.   Neurological: She is alert and oriented to person, place, and time. She has normal strength. No cranial nerve deficit or sensory deficit.  Skin: Skin is warm and dry. Capillary refill takes less than 2 seconds. No rash noted.  Nursing note and vitals reviewed.    ED Treatments / Results  Labs (all labs ordered are listed, but only abnormal results are displayed) Labs Reviewed  COMPREHENSIVE METABOLIC PANEL - Abnormal; Notable for the following:       Result Value   Potassium 5.7 (*)    Chloride 98 (*)    Glucose, Bld 137 (*)    BUN 32 (*)    Creatinine, Ser 1.14 (*)    Calcium 8.5 (*)    Total Protein 6.1 (*)    Albumin 2.8 (*)    Total Bilirubin 2.1 (*)    GFR calc non Af Amer 46 (*)    GFR calc Af Amer 53 (*)    All other components within normal limits  CBC - Abnormal; Notable for the following:    RBC 3.53 (*)    Hemoglobin 10.8 (*)    HCT 33.5 (*)    All other components within normal limits  URINALYSIS, ROUTINE W REFLEX MICROSCOPIC - Abnormal; Notable for the following:    Ketones, ur 5 (*)    All other components within normal limits  CBG MONITORING, ED - Abnormal; Notable for the following:    Glucose-Capillary 148 (*)    All other components within normal limits     EKG  EKG Interpretation None  Radiology Ct Abdomen Pelvis Wo Contrast  Result Date: 04/15/2017 CLINICAL DATA:  Possible free air on chest x-ray earlier today. EXAM: CT ABDOMEN AND PELVIS WITHOUT CONTRAST TECHNIQUE: Multidetector CT imaging of the abdomen and pelvis was performed following the standard protocol without IV contrast. COMPARISON:  06/27/2010 FINDINGS: Lower chest: Subsegmental atelectasis at the lung bases. Heart is enlarged with coronary artery calcification evident. Hepatobiliary: No focal abnormality in the liver on this study without intravenous contrast. There is no evidence for gallstones, gallbladder wall thickening, or pericholecystic fluid. Extrahepatic common duct is dilated at 13 mm without etiology evident. No associated intrahepatic biliary duct dilatation. Pancreas: Insert pancreas Spleen: No splenomegaly. No focal mass lesion. Adrenals/Urinary Tract: Stable thickening right adrenal gland with attenuation suggesting adenoma. 2.6 cm left adrenal nodule cannot be characterized as an adenoma today based on attenuation, but was present on the study from 7 years ago and measured 2.3 cm at that time, most compatible with a benign etiology. 2 mm nonobstructing stone identified interpolar right kidney. Left kidney unremarkable. No evidence for hydroureter. Urinary bladder is markedly distended with the dome at the level of the umbilicus. Stomach/Bowel: Stomach is nondistended. No gastric wall thickening. No evidence of outlet obstruction. Duodenum is normally positioned as is the ligament of Treitz. No small bowel wall thickening. No small bowel dilatation. The terminal ileum is normal. The appendix is normal. No gross colonic mass. No colonic wall thickening. No substantial diverticular change. Vascular/Lymphatic: There is abdominal aortic atherosclerosis without aneurysm. There is no gastrohepatic or hepatoduodenal ligament lymphadenopathy. No intraperitoneal or  retroperitoneal lymphadenopathy. No pelvic sidewall lymphadenopathy. Reproductive: The uterus has normal CT imaging appearance. There is no adnexal mass. Other: No intraperitoneal free fluid. Musculoskeletal: Right hip replacement generates streak artifact obscuring the inferior right anatomic pelvis. Bone windows reveal no worrisome lytic or sclerotic osseous lesions. Mild compression deformity noted at L1 similar to prior. IMPRESSION: 1. No intraperitoneal free air. Appearance on x-ray earlier today presumably related to basilar atelectasis. 2. Marked bladder distention. Component of bladder outlet obstruction suggested by CT. 3. Mild extrahepatic biliary duct dilatation of indeterminate etiology. Correlation with liver function test may prove helpful. 4. Bilateral adrenal nodules, with right adrenal nodule attenuation compatible with adenoma. Larger left adrenal nodule is similar to the study from 7 years ago suggesting benign etiology such as lipid poor adenoma. 5. Tiny nonobstructing right renal stone. 6.  Aortic Atherosclerois (ICD10-170.0) Electronically Signed   By: Misty Stanley M.D.   On: 04/15/2017 19:48   Dg Chest 2 View  Result Date: 04/15/2017 CLINICAL DATA:  Altered mental status, cough and weakness EXAM: CHEST  2 VIEW COMPARISON:  Portable chest x-ray of 04/03/2017 FINDINGS: The lungs are not well aerated with persistent bibasilar atelectasis and/or scarring. However on the frontal view which is semi-erect, free air cannot be excluded under the right hemidiaphragm. Left lateral decubitus of the abdomen is recommended versus CT of the abdomen which is more sensitive to exclude free air. Mediastinal and hilar contours are unchanged and cardiomegaly is stable. Probable old right lateral rib fractures are noted. There are degenerative changes diffusely throughout the thoracic spine. IMPRESSION: 1. Lucency under the right hemidiaphragm. Cannot exclude free intraperitoneal air. Recommend either left  lateral decubitus of the abdomen or CT of the abdomen pelvis to exclude free air. 2. Persistent bibasilar atelectasis or scarring. Critical Value/emergent results were called by telephone at the time of interpretation on 04/15/2017 at 5:12 pm to PA Amaurie Wandel , who verbally acknowledged these  results. Electronically Signed   By: Ivar Drape M.D.   On: 04/15/2017 17:12    Procedures Procedures (including critical care time)  Medications Ordered in ED Medications  sodium chloride 0.9 % bolus 1,000 mL (0 mLs Intravenous Stopped 04/15/17 1808)     Initial Impression / Assessment and Plan / ED Course  I have reviewed the triage vital signs and the nursing notes.  Pertinent labs & imaging results that were available during my care of the patient were reviewed by me and considered in my medical decision making (see chart for details).     Pt [facility] via EMS with reported AMS; reports pt has been lethargic with decreased appetite and sleeping more. PMH of HTN, HLD, DM, right breast cancer s/p lumpectomy. VSS. Exam unremarkable. A&Ox3. Pt intermittently falling sleep during exam but easily arousable. No neuro deficits. Splints present to BUE and left leg with knee immobilizer present. Chart review shows pt was admitted to the hospital on 03/28/17 for bilateral radius fx and left patellar fx 2/2 fall.   EKG showed sinus rhythm with LBBB. K 5.7 reported as hemolyzed. UA with small amount of ketones, BUN 32, Cr 1.14; suspect mild dehydration. Remaining labs consistent with pt's baseline values. CXR showed lucency under right hemidiaphragm, unable to exclude free intraperitoneal air; recommend further imaging for evaluation. Discussed pt and results with Dr. Wilson Singer. Plan to order CT abd/pelvis for further evaluation of CXR abnormality. CT showed no intraperitoneal free air, no other acute abnormalities noted.   On reevaluation pt is alert, oriented x3 and in no acute distress.  Denies any complaints. I spoke with patient's daughter over the phone who states when she came to the ED briefly to visit her mother she was at her baseline mental status. Daughter reports patient was alert, active and having full conversations with her and the patient's son. Daughter denies the patient reporting any complaints. Pt has remained hemodynamically stable while in the ED. Evaluation without any evidence of infection or concerning neurological etiology warranting further workup at this time. Plan to d/c pt back to rehab facility with PCP follow up.   Final Clinical Impressions(s) / ED Diagnoses   Final diagnoses:  Dehydration    New Prescriptions Discharge Medication List as of 04/15/2017  9:32 PM       Nona Dell, PA-C 04/15/17 1700    Virgel Manifold, MD 04/23/17 1135

## 2017-04-15 NOTE — ED Notes (Signed)
Pt to xray

## 2017-04-15 NOTE — ED Notes (Signed)
Got patient on the monitor did ekg shown to er doctor checked blood sugar it was 148 notified RN Benjamine Mola of blood sugar

## 2017-04-19 ENCOUNTER — Encounter (INDEPENDENT_AMBULATORY_CARE_PROVIDER_SITE_OTHER): Payer: Self-pay | Admitting: Orthopaedic Surgery

## 2017-04-19 ENCOUNTER — Ambulatory Visit (INDEPENDENT_AMBULATORY_CARE_PROVIDER_SITE_OTHER): Payer: Medicare Other | Admitting: Orthopaedic Surgery

## 2017-04-19 DIAGNOSIS — S82042A Displaced comminuted fracture of left patella, initial encounter for closed fracture: Secondary | ICD-10-CM

## 2017-04-19 NOTE — Progress Notes (Signed)
Patient is almost 3 weeks status post left partial patellectomy. She is overall doing well.  She is staying at a skilled nursing facility. Her incision has healed nicely on her knee. The sutures were removed today. Continue Bledsoe brace when out of bed and ambulating. She had poor bone quality so we'll advance her along slower. I'll see her back in 3 weeks for recheck. No x-rays needed. Anticipate advancing her range of motion with the brace at that time. Questions encouraged and answered.

## 2017-04-22 DIAGNOSIS — Z9889 Other specified postprocedural states: Secondary | ICD-10-CM | POA: Insufficient documentation

## 2017-04-22 DIAGNOSIS — N3281 Overactive bladder: Secondary | ICD-10-CM | POA: Insufficient documentation

## 2017-04-22 DIAGNOSIS — D649 Anemia, unspecified: Secondary | ICD-10-CM | POA: Insufficient documentation

## 2017-04-22 DIAGNOSIS — S0292XD Unspecified fracture of facial bones, subsequent encounter for fracture with routine healing: Secondary | ICD-10-CM | POA: Insufficient documentation

## 2017-04-22 DIAGNOSIS — Z8781 Personal history of (healed) traumatic fracture: Secondary | ICD-10-CM

## 2017-04-27 ENCOUNTER — Emergency Department (HOSPITAL_COMMUNITY): Payer: Medicare Other

## 2017-04-27 ENCOUNTER — Inpatient Hospital Stay (HOSPITAL_COMMUNITY)
Admission: EM | Admit: 2017-04-27 | Discharge: 2017-05-01 | DRG: 641 | Disposition: A | Discharge status: Skilled Nursing Facility | Payer: Medicare Other | Attending: Internal Medicine | Admitting: Internal Medicine

## 2017-04-27 ENCOUNTER — Encounter (HOSPITAL_COMMUNITY): Payer: Self-pay

## 2017-04-27 DIAGNOSIS — H269 Unspecified cataract: Secondary | ICD-10-CM | POA: Diagnosis present

## 2017-04-27 DIAGNOSIS — B961 Klebsiella pneumoniae [K. pneumoniae] as the cause of diseases classified elsewhere: Secondary | ICD-10-CM | POA: Diagnosis present

## 2017-04-27 DIAGNOSIS — R6251 Failure to thrive (child): Secondary | ICD-10-CM

## 2017-04-27 DIAGNOSIS — N3281 Overactive bladder: Secondary | ICD-10-CM | POA: Diagnosis present

## 2017-04-27 DIAGNOSIS — E119 Type 2 diabetes mellitus without complications: Secondary | ICD-10-CM | POA: Diagnosis present

## 2017-04-27 DIAGNOSIS — K5900 Constipation, unspecified: Secondary | ICD-10-CM

## 2017-04-27 DIAGNOSIS — D649 Anemia, unspecified: Secondary | ICD-10-CM | POA: Diagnosis present

## 2017-04-27 DIAGNOSIS — Z96641 Presence of right artificial hip joint: Secondary | ICD-10-CM | POA: Diagnosis present

## 2017-04-27 DIAGNOSIS — E440 Moderate protein-calorie malnutrition: Secondary | ICD-10-CM | POA: Diagnosis not present

## 2017-04-27 DIAGNOSIS — I10 Essential (primary) hypertension: Secondary | ICD-10-CM | POA: Diagnosis present

## 2017-04-27 DIAGNOSIS — Z794 Long term (current) use of insulin: Secondary | ICD-10-CM

## 2017-04-27 DIAGNOSIS — L89152 Pressure ulcer of sacral region, stage 2: Secondary | ICD-10-CM | POA: Diagnosis present

## 2017-04-27 DIAGNOSIS — E876 Hypokalemia: Secondary | ICD-10-CM | POA: Diagnosis not present

## 2017-04-27 DIAGNOSIS — R112 Nausea with vomiting, unspecified: Secondary | ICD-10-CM

## 2017-04-27 DIAGNOSIS — D72829 Elevated white blood cell count, unspecified: Secondary | ICD-10-CM | POA: Diagnosis present

## 2017-04-27 DIAGNOSIS — N390 Urinary tract infection, site not specified: Secondary | ICD-10-CM | POA: Diagnosis present

## 2017-04-27 DIAGNOSIS — R627 Adult failure to thrive: Secondary | ICD-10-CM | POA: Diagnosis not present

## 2017-04-27 DIAGNOSIS — C50411 Malignant neoplasm of upper-outer quadrant of right female breast: Secondary | ICD-10-CM | POA: Diagnosis present

## 2017-04-27 DIAGNOSIS — E785 Hyperlipidemia, unspecified: Secondary | ICD-10-CM | POA: Diagnosis present

## 2017-04-27 DIAGNOSIS — E860 Dehydration: Secondary | ICD-10-CM | POA: Diagnosis not present

## 2017-04-27 DIAGNOSIS — A498 Other bacterial infections of unspecified site: Secondary | ICD-10-CM | POA: Diagnosis present

## 2017-04-27 DIAGNOSIS — T07XXXA Unspecified multiple injuries, initial encounter: Secondary | ICD-10-CM | POA: Diagnosis present

## 2017-04-27 DIAGNOSIS — M810 Age-related osteoporosis without current pathological fracture: Secondary | ICD-10-CM | POA: Diagnosis present

## 2017-04-27 DIAGNOSIS — E118 Type 2 diabetes mellitus with unspecified complications: Secondary | ICD-10-CM

## 2017-04-27 DIAGNOSIS — Z7982 Long term (current) use of aspirin: Secondary | ICD-10-CM

## 2017-04-27 DIAGNOSIS — Z7984 Long term (current) use of oral hypoglycemic drugs: Secondary | ICD-10-CM

## 2017-04-27 DIAGNOSIS — Z79899 Other long term (current) drug therapy: Secondary | ICD-10-CM

## 2017-04-27 LAB — COMPREHENSIVE METABOLIC PANEL
ALK PHOS: 113 U/L (ref 38–126)
ALT: 18 U/L (ref 14–54)
AST: 20 U/L (ref 15–41)
Albumin: 2.7 g/dL — ABNORMAL LOW (ref 3.5–5.0)
Anion gap: 12 (ref 5–15)
BILIRUBIN TOTAL: 0.6 mg/dL (ref 0.3–1.2)
BUN: 31 mg/dL — ABNORMAL HIGH (ref 6–20)
CALCIUM: 8.2 mg/dL — AB (ref 8.9–10.3)
CO2: 31 mmol/L (ref 22–32)
Chloride: 97 mmol/L — ABNORMAL LOW (ref 101–111)
Creatinine, Ser: 0.81 mg/dL (ref 0.44–1.00)
GLUCOSE: 221 mg/dL — AB (ref 65–99)
Potassium: 2.9 mmol/L — ABNORMAL LOW (ref 3.5–5.1)
Sodium: 140 mmol/L (ref 135–145)
TOTAL PROTEIN: 6.8 g/dL (ref 6.5–8.1)

## 2017-04-27 LAB — URINALYSIS, ROUTINE W REFLEX MICROSCOPIC
Bilirubin Urine: NEGATIVE
Ketones, ur: 5 mg/dL — AB
Nitrite: NEGATIVE
Protein, ur: 100 mg/dL — AB
SPECIFIC GRAVITY, URINE: 1.024 (ref 1.005–1.030)
pH: 6 (ref 5.0–8.0)

## 2017-04-27 LAB — GLUCOSE, CAPILLARY
GLUCOSE-CAPILLARY: 174 mg/dL — AB (ref 65–99)
GLUCOSE-CAPILLARY: 192 mg/dL — AB (ref 65–99)

## 2017-04-27 LAB — CBC WITH DIFFERENTIAL/PLATELET
BASOS ABS: 0 10*3/uL (ref 0.0–0.1)
BASOS PCT: 0 %
EOS PCT: 0 %
Eosinophils Absolute: 0 10*3/uL (ref 0.0–0.7)
HEMATOCRIT: 36.6 % (ref 36.0–46.0)
Hemoglobin: 11.4 g/dL — ABNORMAL LOW (ref 12.0–15.0)
LYMPHS PCT: 10 %
Lymphs Abs: 1.1 10*3/uL (ref 0.7–4.0)
MCH: 29.5 pg (ref 26.0–34.0)
MCHC: 31.1 g/dL (ref 30.0–36.0)
MCV: 94.6 fL (ref 78.0–100.0)
Monocytes Absolute: 0.9 10*3/uL (ref 0.1–1.0)
Monocytes Relative: 8 %
Neutro Abs: 9.6 10*3/uL — ABNORMAL HIGH (ref 1.7–7.7)
Neutrophils Relative %: 82 %
Platelets: 230 10*3/uL (ref 150–400)
RBC: 3.87 MIL/uL (ref 3.87–5.11)
RDW: 13.2 % (ref 11.5–15.5)
WBC: 11.7 10*3/uL — AB (ref 4.0–10.5)

## 2017-04-27 LAB — PREALBUMIN: PREALBUMIN: 16.2 mg/dL — AB (ref 18–38)

## 2017-04-27 LAB — I-STAT TROPONIN, ED: TROPONIN I, POC: 0.03 ng/mL (ref 0.00–0.08)

## 2017-04-27 LAB — LACTIC ACID, PLASMA: Lactic Acid, Venous: 1.1 mmol/L (ref 0.5–1.9)

## 2017-04-27 LAB — MAGNESIUM: MAGNESIUM: 2 mg/dL (ref 1.7–2.4)

## 2017-04-27 LAB — LIPASE, BLOOD: LIPASE: 22 U/L (ref 11–51)

## 2017-04-27 MED ORDER — DEXTROSE 5 % IV SOLN
1.0000 g | Freq: Once | INTRAVENOUS | Status: AC
  Administered 2017-04-27: 1 g via INTRAVENOUS
  Filled 2017-04-27: qty 10

## 2017-04-27 MED ORDER — DEXTROSE 5 % IV SOLN
1.0000 g | INTRAVENOUS | Status: DC
  Administered 2017-04-28: 1 g via INTRAVENOUS
  Filled 2017-04-27 (×2): qty 10

## 2017-04-27 MED ORDER — IOPAMIDOL (ISOVUE-300) INJECTION 61%
INTRAVENOUS | Status: AC
  Filled 2017-04-27: qty 100

## 2017-04-27 MED ORDER — SODIUM CHLORIDE 0.9 % IV SOLN
INTRAVENOUS | Status: AC
  Administered 2017-04-27 – 2017-04-28 (×3): via INTRAVENOUS

## 2017-04-27 MED ORDER — OXYCODONE HCL 5 MG PO TABS
5.0000 mg | ORAL_TABLET | Freq: Four times a day (QID) | ORAL | Status: DC
  Administered 2017-04-27 (×2): 5 mg via ORAL
  Filled 2017-04-27 (×2): qty 1

## 2017-04-27 MED ORDER — ONDANSETRON HCL 4 MG PO TABS: 4.0000 mg | ORAL_TABLET | Freq: Four times a day (QID) | ORAL | Status: DC | PRN

## 2017-04-27 MED ORDER — POTASSIUM CHLORIDE CRYS ER 10 MEQ PO TBCR
10.0000 meq | EXTENDED_RELEASE_TABLET | Freq: Every day | ORAL | Status: DC
  Administered 2017-04-29: 10 meq via ORAL
  Filled 2017-04-27 (×2): qty 1

## 2017-04-27 MED ORDER — BISACODYL 10 MG RE SUPP: 10.0000 mg | Freq: Every day | RECTAL | Status: DC | PRN

## 2017-04-27 MED ORDER — FENOFIBRATE 54 MG PO TABS
54.0000 mg | ORAL_TABLET | Freq: Every day | ORAL | Status: DC
  Administered 2017-04-27 – 2017-04-29 (×2): 54 mg via ORAL
  Filled 2017-04-27 (×4): qty 1

## 2017-04-27 MED ORDER — HEPARIN SODIUM (PORCINE) 5000 UNIT/ML IJ SOLN
5000.0000 [IU] | Freq: Three times a day (TID) | INTRAMUSCULAR | Status: DC
  Administered 2017-04-27 – 2017-04-30 (×9): 5000 [IU] via SUBCUTANEOUS
  Filled 2017-04-27 (×8): qty 1

## 2017-04-27 MED ORDER — ACETAMINOPHEN 325 MG PO TABS
650.0000 mg | ORAL_TABLET | Freq: Four times a day (QID) | ORAL | Status: DC | PRN
  Administered 2017-04-27 – 2017-04-29 (×2): 650 mg via ORAL
  Filled 2017-04-27 (×2): qty 2

## 2017-04-27 MED ORDER — INSULIN ASPART 100 UNIT/ML ~~LOC~~ SOLN
0.0000 [IU] | Freq: Three times a day (TID) | SUBCUTANEOUS | Status: DC
  Administered 2017-04-27: 2 [IU] via SUBCUTANEOUS
  Administered 2017-04-28 (×2): 1 [IU] via SUBCUTANEOUS

## 2017-04-27 MED ORDER — AMLODIPINE BESYLATE 5 MG PO TABS
10.0000 mg | ORAL_TABLET | Freq: Every day | ORAL | Status: DC
  Administered 2017-04-29: 10 mg via ORAL
  Filled 2017-04-27 (×3): qty 2

## 2017-04-27 MED ORDER — HYDRALAZINE HCL 20 MG/ML IJ SOLN
5.0000 mg | Freq: Three times a day (TID) | INTRAMUSCULAR | Status: DC | PRN
Start: 2017-04-27 — End: 2017-05-01

## 2017-04-27 MED ORDER — ACETAMINOPHEN 650 MG RE SUPP: 650.0000 mg | Freq: Four times a day (QID) | RECTAL | Status: DC | PRN

## 2017-04-27 MED ORDER — LISINOPRIL 20 MG PO TABS
40.0000 mg | ORAL_TABLET | Freq: Every day | ORAL | Status: DC
  Administered 2017-04-27 – 2017-04-29 (×2): 40 mg via ORAL
  Filled 2017-04-27 (×4): qty 2

## 2017-04-27 MED ORDER — SODIUM CHLORIDE 0.9 % IV BOLUS (SEPSIS)
1000.0000 mL | Freq: Once | INTRAVENOUS | Status: AC
  Administered 2017-04-27: 1000 mL via INTRAVENOUS

## 2017-04-27 MED ORDER — ASPIRIN EC 81 MG PO TBEC
81.0000 mg | DELAYED_RELEASE_TABLET | Freq: Every day | ORAL | Status: DC
  Administered 2017-04-29: 81 mg via ORAL
  Filled 2017-04-27 (×3): qty 1

## 2017-04-27 MED ORDER — CITALOPRAM HYDROBROMIDE 20 MG PO TABS
20.0000 mg | ORAL_TABLET | Freq: Every day | ORAL | Status: DC
  Administered 2017-04-29: 20 mg via ORAL
  Filled 2017-04-27: qty 2
  Filled 2017-04-27 (×2): qty 1
  Filled 2017-04-27: qty 2

## 2017-04-27 MED ORDER — SODIUM CHLORIDE 0.9% FLUSH
3.0000 mL | Freq: Two times a day (BID) | INTRAVENOUS | Status: DC
  Administered 2017-04-27 – 2017-04-28 (×2): 3 mL via INTRAVENOUS

## 2017-04-27 MED ORDER — MIRTAZAPINE 7.5 MG PO TABS
7.5000 mg | ORAL_TABLET | Freq: Every day | ORAL | Status: DC
  Administered 2017-04-27 – 2017-04-30 (×4): 7.5 mg via ORAL
  Filled 2017-04-27 (×4): qty 1

## 2017-04-27 MED ORDER — SENNOSIDES-DOCUSATE SODIUM 8.6-50 MG PO TABS: 1.0000 | ORAL_TABLET | Freq: Every evening | ORAL | Status: DC | PRN

## 2017-04-27 MED ORDER — SENNA 8.6 MG PO TABS
1.0000 | ORAL_TABLET | Freq: Every day | ORAL | Status: DC
  Administered 2017-04-29 – 2017-04-30 (×2): 8.6 mg via ORAL
  Filled 2017-04-27 (×3): qty 1

## 2017-04-27 MED ORDER — ATORVASTATIN CALCIUM 20 MG PO TABS
20.0000 mg | ORAL_TABLET | Freq: Every day | ORAL | Status: DC
  Administered 2017-04-27 – 2017-04-30 (×4): 20 mg via ORAL
  Filled 2017-04-27 (×4): qty 1

## 2017-04-27 MED ORDER — ONDANSETRON HCL 4 MG/2ML IJ SOLN
4.0000 mg | Freq: Once | INTRAMUSCULAR | Status: AC
  Administered 2017-04-27: 4 mg via INTRAVENOUS
  Filled 2017-04-27: qty 2

## 2017-04-27 MED ORDER — KETOROLAC TROMETHAMINE 15 MG/ML IJ SOLN: 15.0000 mg | Freq: Three times a day (TID) | INTRAMUSCULAR | Status: DC | PRN

## 2017-04-27 MED ORDER — SODIUM CHLORIDE 0.9 % IV SOLN
Freq: Once | INTRAVENOUS | Status: AC
  Administered 2017-04-27: 100 mL/h via INTRAVENOUS

## 2017-04-27 MED ORDER — DOCUSATE SODIUM 100 MG PO CAPS
100.0000 mg | ORAL_CAPSULE | Freq: Two times a day (BID) | ORAL | Status: DC
Start: 2017-04-27 — End: 2017-05-01
  Administered 2017-04-27 – 2017-04-30 (×6): 100 mg via ORAL
  Filled 2017-04-27 (×8): qty 1

## 2017-04-27 MED ORDER — FUROSEMIDE 20 MG PO TABS
40.0000 mg | ORAL_TABLET | Freq: Every day | ORAL | Status: DC
  Administered 2017-04-27 – 2017-04-29 (×2): 40 mg via ORAL
  Filled 2017-04-27 (×5): qty 2

## 2017-04-27 MED ORDER — ONDANSETRON HCL 4 MG/2ML IJ SOLN
4.0000 mg | Freq: Four times a day (QID) | INTRAMUSCULAR | Status: DC | PRN
  Administered 2017-04-29: 4 mg via INTRAVENOUS
  Filled 2017-04-27: qty 2

## 2017-04-27 MED ORDER — LACTULOSE ENEMA
300.0000 mL | Freq: Once | ORAL | Status: DC
  Filled 2017-04-27: qty 300

## 2017-04-27 MED ORDER — POTASSIUM CHLORIDE 10 MEQ/100ML IV SOLN
10.0000 meq | INTRAVENOUS | Status: AC
  Administered 2017-04-27: 10 meq via INTRAVENOUS
  Filled 2017-04-27 (×3): qty 100

## 2017-04-27 MED ORDER — POTASSIUM CHLORIDE 10 MEQ/100ML IV SOLN
10.0000 meq | INTRAVENOUS | Status: DC
  Administered 2017-04-27 (×3): 10 meq via INTRAVENOUS
  Filled 2017-04-27 (×2): qty 100

## 2017-04-27 MED ORDER — DARIFENACIN HYDROBROMIDE ER 7.5 MG PO TB24
7.5000 mg | ORAL_TABLET | Freq: Every day | ORAL | Status: DC
  Administered 2017-04-28 – 2017-04-29 (×2): 7.5 mg via ORAL
  Filled 2017-04-27 (×4): qty 1

## 2017-04-27 MED ORDER — INSULIN DETEMIR 100 UNIT/ML ~~LOC~~ SOLN
26.0000 [IU] | Freq: Every day | SUBCUTANEOUS | Status: DC
  Administered 2017-04-27 – 2017-04-30 (×4): 26 [IU] via SUBCUTANEOUS
  Filled 2017-04-27 (×4): qty 0.26

## 2017-04-27 NOTE — ED Notes (Signed)
Patient is resting

## 2017-04-27 NOTE — ED Notes (Signed)
Patient transported to CT 

## 2017-04-27 NOTE — ED Triage Notes (Addendum)
Pt arrives EMS from NH with c/o abdominal pain and vomiting x 50 per pt. Pt speaks [place]nian. Daughter now at bedside. Pt c/o abdominal pain started on Friday.Splints at bilateral upper extremities and metal splint at left leg.

## 2017-04-27 NOTE — ED Notes (Signed)
Pt returns from xray

## 2017-04-27 NOTE — ED Notes (Signed)
ED Provider at bedside. 

## 2017-04-27 NOTE — ED Notes (Addendum)
Pt returns from xray. Warm blanket given. Pt shakes head "no" when asked about pain.

## 2017-04-27 NOTE — ED Notes (Signed)
IV placement confirmed with blood return . Flushes easily and runs to gravity.

## 2017-04-27 NOTE — ED Notes (Signed)
Patient transported to X-ray 

## 2017-04-27 NOTE — ED Provider Notes (Signed)
[facility] DEPT Provider Note   CSN: [id number] Arrival date & time: 04/27/17  1143     History   Chief Complaint Chief Complaint  Patient presents with  . Emesis    HPI Christy [name] is a 76 y.o. female history of diabetes, hypertension, hyperlipidemia, recent bilateral forearm fractures as well as left leg fracture here presenting with vomiting, abdominal pain. Patient was seen in the ED about a week ago and had x-ray that showed possible free air by CT that showed no free air and there is just bladder outlet obstruction. Foley was placed and was swapped out yesterday. Patient initially was doing well but stopped eating about 5 days ago. Patient has poor appetite and has been throwing everything back up especially since yesterday. She said she vomited 50 times but report from nursing home states that she threw up about 3 times since yesterday. Has new stage 2 sacral ulcer as well.   The history is provided by the patient and a relative. A language interpreter was used.   Language interpreter used, daughter interpreting as well   Past Medical History:  Diagnosis Date  . Abnormal ECG    Inferior Q waves  . Cancer ([place])    stage II right breast cancer  . Cataract   . Diabetes mellitus   . Hypercholesterolemia   . Hyperlipidemia   . Hypertension   . Osteoporosis   . Overactive bladder     Patient Active Problem List   Diagnosis Date Noted  . OAB (overactive bladder) 04/22/2017  . S/P ORIF (open reduction internal fixation) fracture 04/22/2017  . Closed fracture of facial bone with routine healing 04/22/2017  . Anemia 04/22/2017  . Dehydration   . Displaced comminuted fracture of left patella, initial encounter for closed fracture 03/31/2017  . Elevated troponin   . Acute renal failure (ARF) ([place]) 03/29/2017  . Leukocytosis 03/29/2017  . Fall 03/29/2017  . Multiple fractures 03/28/2017  . Hyperlipidemia 12/27/2016  . Essential hypertension 12/27/2016  . Elevated  serum creatinine 08/07/2014  . Elevated BUN 08/07/2014  . Uncontrolled type II diabetes mellitus ([place]) 08/07/2014  . Osteoporosis 08/07/2014  . Breast cancer of upper-outer quadrant of right female breast ([place]) 08/07/2013  . Abnormal EKG 08/21/2011    Past Surgical History:  Procedure Laterality Date  . BREAST SURGERY     right lumpectomy, sentinel node biopsy  . CATARACT EXTRACTION Bilateral 2008  . FRACTURE SURGERY     ORIF right distal radius fx  . JOINT REPLACEMENT     right hip  . ORIF RADIAL FRACTURE Bilateral 03/29/2017   Procedure: OPEN REDUCTION INTERNAL FIXATION (ORIF) RADIAL FRACTURES;  Surgeon: Iran Planas, MD;  Location: [facility];  Service: Orthopedics;  Laterality: Bilateral;  . PATELLECTOMY Left 03/31/2017   Procedure: LEFT PARTIAL PATELLECTOMY;  Surgeon: Leandrew Koyanagi, MD;  Location: [facility];  Service: Orthopedics;  Laterality: Left;  . PORT-A-CATH REMOVAL  09/11/2011   Procedure: REMOVAL PORT-A-CATH;  Surgeon: Rolm Bookbinder, MD;  Location: [facility];  Service: General;  Laterality: Left;  local   . PORTACATH PLACEMENT      OB History    No data available       Home Medications    Prior to Admission medications   Medication Sig Start Date End Date Taking? Authorizing Provider  acetaminophen (TYLENOL) 325 MG tablet Take 2 tablets (650 mg total) by mouth every 6 (six) hours as needed for mild pain. 04/05/17   Verner Mould, MD  alendronate (FOSAMAX) 70 MG tablet Take 1 tablet (70 mg total) by mouth every 7 (seven) days. Take with a full glass of water on an empty stomach. 12/31/16   [name], Earna Coder, MD  amLODipine (NORVASC) 10 MG tablet Take 1 tablet (10 mg total) by mouth daily. 04/05/17   Verner Mould, MD  aspirin EC 81 MG tablet Take 81 mg by mouth daily.    [provider]  atorvastatin (LIPITOR) 20 MG tablet Take 20 mg by mouth at bedtime.    [provider]  cefTRIAXone (ROCEPHIN) 1 g injection Inject 1  g into the muscle daily. For 10 days    [provider]  Cholecalciferol (VITAMIN D) 2000 units tablet Take 2,000 Units by mouth daily.    [provider]  citalopram (CELEXA) 20 MG tablet Take 1 tablet (20 mg total) by mouth daily. 04/06/17   [name], Earna Coder, MD  docusate sodium (COLACE) 100 MG capsule Take 100 mg by mouth 2 (two) times daily. Hold for loose stool    [provider]  fenofibrate (TRICOR) 48 MG tablet Take 1 tablet (48 mg total) by mouth daily. 12/31/16   [name], Earna Coder, MD  furosemide (LASIX) 40 MG tablet Take 1 tablet (40 mg total) by mouth daily. 12/31/16   [name], Earna Coder, MD  glimepiride (AMARYL) 2 MG tablet Take 1 tablet (2 mg total) by mouth daily before breakfast. 12/31/16   [name], Earna Coder, MD  ibuprofen (ADVIL,MOTRIN) 800 MG tablet Take 800 mg by mouth every 8 (eight) hours as needed for fever or moderate pain.  02/28/13   [provider]  insulin aspart (NOVOLOG) 100 UNIT/ML injection Inject 30 Units into the skin 2 (two) times daily with a meal.    [provider]  insulin detemir (LEVEMIR) 100 UNIT/ML injection Inject 0.26 mLs (26 Units total) into the skin at bedtime. 04/05/17   [name], Earna Coder, MD  lisinopril (PRINIVIL,ZESTRIL) 40 MG tablet Take 1 tablet (40 mg total) by mouth daily. 12/31/16   [name], Earna Coder, MD  metFORMIN (GLUCOPHAGE-XR) 500 MG 24 hr tablet Take 2 tablets (1,000 mg total) by mouth 2 (two) times daily. 01/13/17   [name], Earna Coder, MD  mirtazapine (REMERON) 7.5 MG tablet Take 7.5 mg by mouth at bedtime. FOR APPETITE    [provider]  Omega-3 Fatty Acids (FISH OIL) 1200 MG CAPS Take 1,200 mg by mouth daily.     [provider]  ondansetron (ZOFRAN) 4 MG tablet Take 4 mg by mouth every 6 (six) hours. FOR NAUSEA AND VOMITING    [provider]  oxyCODONE (OXY IR/ROXICODONE) 5 MG immediate release tablet Take 5 mg by mouth every 6 (six) hours. AND MAY TAKE 5 MG EVERY THREE HOURS "AS  NEEDED" FOR BREAKTHROUGH PAIN    [provider]  potassium chloride (K-DUR,KLOR-CON) 10 MEQ tablet Take 10 mEq by mouth daily.    [provider]  senna (SENOKOT) 8.6 MG TABS tablet Take 1 tablet (8.6 mg total) by mouth daily. 04/06/17   [name], Earna Coder, MD  solifenacin (VESICARE) 10 MG tablet Take 1 tablet (10 mg total) by mouth daily. 12/31/16   [name], Earna Coder, MD  UNABLE TO FIND House 2.0/Mes pass: Drink 90 ml's by mouth three times a day for poor meal intake    [provider]    Family History Family History  Problem Relation Age of Onset  . Stroke Mother   . Stroke Father   . Hypertension Sister   . Stroke Sister   .  Stroke Brother     Social History Social History  Substance Use Topics  . Smoking status: Former Smoker    Types: Cigarettes    Quit date: 12/27/2012  . Smokeless tobacco: Never Used     Comment: quit 2009  . Alcohol use No     Allergies   No known allergies   Review of Systems Review of Systems  Gastrointestinal: Positive for abdominal pain and vomiting.  All other systems reviewed and are negative.    Physical Exam Updated Vital Signs BP (!) 152/60   Pulse 72   Temp 98.9 F (37.2 C) (Oral)   Resp 16   Ht 5\' 7"  (1.702 m)   Wt 99.8 kg (220 lb)   SpO2 96%   BMI 34.46 kg/m   Physical Exam  Constitutional:  Chronically ill appearing, dehydrated   HENT:  Head: Normocephalic.  MM dry   Eyes: EOM are normal. Pupils are equal, round, and reactive to light.  Neck: Normal range of motion. Neck supple.  Cardiovascular: Normal rate, regular rhythm and normal heart sounds.   Pulmonary/Chest: Effort normal and breath sounds normal. No respiratory distress. She has no wheezes.  Abdominal:  Mild epigastric tenderness, no rebound. Foley in place   Musculoskeletal:  Stage 2 decub ulcer, no obvious abscess or cellulitis. Bilateral forearm splints in place, 2+ pulses, able to wiggle fingers. Traction splint L leg in place     Neurological: She is alert.  Skin: Skin is warm.  Psychiatric: She has a normal mood and affect.  Nursing note and vitals reviewed.    ED Treatments / Results  Labs (all labs ordered are listed, but only abnormal results are displayed) Labs Reviewed  CBC WITH DIFFERENTIAL/PLATELET - Abnormal; Notable for the following:       Result Value   WBC 11.7 (*)    Hemoglobin 11.4 (*)    Neutro Abs 9.6 (*)    All other components within normal limits  COMPREHENSIVE METABOLIC PANEL - Abnormal; Notable for the following:    Potassium 2.9 (*)    Chloride 97 (*)    Glucose, Bld 221 (*)    BUN 31 (*)    Calcium 8.2 (*)    Albumin 2.7 (*)    All other components within normal limits  URINALYSIS, ROUTINE W REFLEX MICROSCOPIC - Abnormal; Notable for the following:    Glucose, UA >=500 (*)    Hgb urine dipstick SMALL (*)    Ketones, ur 5 (*)    Protein, ur 100 (*)    Leukocytes, UA MODERATE (*)    Bacteria, UA RARE (*)    Squamous Epithelial / LPF 0-5 (*)    All other components within normal limits  URINE CULTURE  LIPASE, BLOOD  I-STAT TROPOININ, ED    EKG  EKG Interpretation None       Radiology Dg Abd Acute W/chest  Result Date: 04/27/2017 CLINICAL DATA:  Abdominal pain and vomiting EXAM: DG ABDOMEN ACUTE W/ 1V CHEST COMPARISON:  04/15/2017 FINDINGS: Cardiac shadow is again enlarged. Aortic calcifications are again seen. Scarring is noted in the bases bilaterally stable from the prior exam. Scattered large and small bowel gas is noted. No abnormal mass or abnormal calcifications are noted. Right hip replacement is seen. No free air is noted. Degenerative changes of the lumbar spine are seen with compression deformity of L1 of chronic nature. IMPRESSION: Chronic changes without acute abnormality. Electronically Signed   By: Inez Catalina M.D.   On: 04/27/2017  13:09    Procedures Procedures (including critical care time)  Medications Ordered in ED Medications  cefTRIAXone  (ROCEPHIN) 1 g in dextrose 5 % 50 mL IVPB (1 g Intravenous New Bag/Given 04/27/17 1357)  iopamidol (ISOVUE-300) 61 % injection (not administered)  potassium chloride 10 mEq in 100 mL IVPB (not administered)  sodium chloride 0.9 % bolus 1,000 mL (1,000 mLs Intravenous New Bag/Given 04/27/17 1227)  ondansetron (ZOFRAN) injection 4 mg (4 mg Intravenous Given 04/27/17 1237)     Initial Impression / Assessment and Plan / ED Course  I have reviewed the triage vital signs and the nursing notes.  Pertinent labs & imaging results that were available during my care of the patient were reviewed by me and considered in my medical decision making (see chart for details).     Christy [name] is a 76 y.o. female here with vomiting, abdominal pain, dehydration. Had recent fall with multiple extremity trauma and has multiple splints. Appears dehydrated. Will get labs, UA, repeat CT ab/pel with contrast.   3:51 PM WBC nl. UA + leuks. Urine culture sent. Ordered rocephin. K 2.9 likely from vomiting. Ordered K supplement. CT unchanged. Hospitalist to admit for dehydration, hypokalemia.    Final Clinical Impressions(s) / ED Diagnoses   Final diagnoses:  None    New Prescriptions New Prescriptions   No medications on file     Drenda Freeze, MD 04/27/17 [phone]

## 2017-04-27 NOTE — H&P (Signed)
History and Physical    Christy [name] WHQ:[id number] DOB: Sep 07, 1941 DOA: 04/27/2017   PCP: [name], MD   Patient coming from:  Home    Chief Complaint: Intractable nausea, vomiting, abdominal pain and FTT   HPI: Christy [name] is a 76 y.o. female from [place] with medical history significant for DM, HTN, HLD, sacral ulcer, recent bilateral forearms fracture, R arm fracture due to osteoporosis and falls, priorly seen at the ED 1 week ago with possible bladder outlet obstruction, d/c with foley, which was changed on 6/25. In the interim, patient's overall status has declined, with FTT, and vomiting about 5 times prior to admission . History was obtained by EDP, chart and is limited due to  language barrier and overal clinical status. No food poisoning. Denies fevers, chills, night sweats, vision changes  Denies any respiratory complaints. Denies any chest pain or palpitations. Denies lower extremity swelling. She reports abdominal pain and constipation . Denies abnormal skin rashes  Denies any bleeding issues such as epistaxis, hematemesis, hematuria or hematochezia.    ED Course:  BP (!) 149/61   Pulse 78   Temp 98.5 F (36.9 C) (Oral)   Resp 16   Ht 5\' 7"  (1.702 m)   Wt 99.8 kg (220 lb)   SpO2 94%   BMI 34.46 kg/m    Rocephin IV IV bolus 2 L  IV Zofran given  WBC 11.7 GLu 221   Review of Systems:  As per HPI otherwise all other systems reviewed and are negative  Past Medical History:  Diagnosis Date  . Abnormal ECG    Inferior Q waves  . Cancer ([place])    stage II right breast cancer  . Cataract   . Diabetes mellitus   . Hypercholesterolemia   . Hyperlipidemia   . Hypertension   . Osteoporosis   . Overactive bladder     Past Surgical History:  Procedure Laterality Date  . BREAST SURGERY     right lumpectomy, sentinel node biopsy  . CATARACT EXTRACTION Bilateral 2008  . FRACTURE SURGERY     ORIF right distal radius fx  . JOINT REPLACEMENT     right hip    . ORIF RADIAL FRACTURE Bilateral 03/29/2017   Procedure: OPEN REDUCTION INTERNAL FIXATION (ORIF) RADIAL FRACTURES;  Surgeon: Iran Planas, MD;  Location: [place] [facility];  Service: Orthopedics;  Laterality: Bilateral;  . PATELLECTOMY Left 03/31/2017   Procedure: LEFT PARTIAL PATELLECTOMY;  Surgeon: Leandrew Koyanagi, MD;  Location: [place];  Service: Orthopedics;  Laterality: Left;  . PORT-A-CATH REMOVAL  09/11/2011   Procedure: REMOVAL PORT-A-CATH;  Surgeon: Rolm Bookbinder, MD;  Location: [facility];  Service: General;  Laterality: Left;  local   . PORTACATH PLACEMENT      Social History Social History   Social History  . Marital status: Widowed    Spouse name: N/A  . Number of children: N/A  . Years of education: N/A   Occupational History  . Retired    Social History Main Topics  . Smoking status: Former Smoker    Types: Cigarettes    Quit date: 12/27/2012  . Smokeless tobacco: Never Used     Comment: quit 2009  . Alcohol use No  . Drug use: No  . Sexual activity: No   Other Topics Concern  . Not on file   Social History Narrative   From [place], Training and development officer. She is [place] but can understand [name] and [place]. She has a son and daughter who  help in her care.     Allergies  Allergen Reactions  . No Known Allergies     Family History  Problem Relation Age of Onset  . Stroke Mother   . Stroke Father   . Hypertension Sister   . Stroke Sister   . Stroke Brother       Prior to Admission medications   Medication Sig Start Date End Date Taking? Authorizing Provider  docusate sodium (COLACE) 100 MG capsule Take 100 mg by mouth 2 (two) times daily. Hold for loose stool   Yes [provider]  fenofibrate (TRICOR) 48 MG tablet Take 1 tablet (48 mg total) by mouth daily. 12/31/16  Yes [name], [name], MD  lisinopril (PRINIVIL,ZESTRIL) 40 MG tablet Take 1 tablet (40 mg total) by mouth daily. 12/31/16  Yes [name], [name], MD  mirtazapine (REMERON)  7.5 MG tablet Take 7.5 mg by mouth at bedtime. FOR APPETITE   Yes [provider]  Omega-3 Fatty Acids (FISH OIL) 1200 MG CAPS Take 1,200 mg by mouth daily.    Yes [provider]  acetaminophen (TYLENOL) 325 MG tablet Take 2 tablets (650 mg total) by mouth every 6 (six) hours as needed for mild pain. 04/05/17   Verner Mould, MD  alendronate (FOSAMAX) 70 MG tablet Take 1 tablet (70 mg total) by mouth every 7 (seven) days. Take with a full glass of water on an empty stomach. 12/31/16   [name], Earna Coder, MD  amLODipine (NORVASC) 10 MG tablet Take 1 tablet (10 mg total) by mouth daily. 04/05/17   Verner Mould, MD  aspirin EC 81 MG tablet Take 81 mg by mouth daily.    [provider]  atorvastatin (LIPITOR) 20 MG tablet Take 20 mg by mouth at bedtime.    [provider]  cefTRIAXone (ROCEPHIN) 1 g injection Inject 1 g into the muscle daily. For 10 days    [provider]  Cholecalciferol (VITAMIN D) 2000 units tablet Take 2,000 Units by mouth daily.    [provider]  citalopram (CELEXA) 20 MG tablet Take 1 tablet (20 mg total) by mouth daily. 04/06/17   [name], Earna Coder, MD  furosemide (LASIX) 40 MG tablet Take 1 tablet (40 mg total) by mouth daily. 12/31/16   [name], Earna Coder, MD  glimepiride (AMARYL) 2 MG tablet Take 1 tablet (2 mg total) by mouth daily before breakfast. 12/31/16   [name], Earna Coder, MD  ibuprofen (ADVIL,MOTRIN) 800 MG tablet Take 800 mg by mouth every 8 (eight) hours as needed for fever or moderate pain.  02/28/13   [provider]  insulin aspart (NOVOLOG) 100 UNIT/ML injection Inject 30 Units into the skin 2 (two) times daily with a meal.    [provider]  insulin detemir (LEVEMIR) 100 UNIT/ML injection Inject 0.26 mLs (26 Units total) into the skin at bedtime. 04/05/17   [name], Earna Coder, MD  metFORMIN (GLUCOPHAGE-XR) 500 MG 24 hr tablet Take 2 tablets (1,000 mg total) by mouth 2 (two) times  daily. 01/13/17   [name], Earna Coder, MD  ondansetron (ZOFRAN) 4 MG tablet Take 4 mg by mouth every 6 (six) hours. FOR NAUSEA AND VOMITING    [provider]  oxyCODONE (OXY IR/ROXICODONE) 5 MG immediate release tablet Take 5 mg by mouth every 6 (six) hours. AND MAY TAKE 5 MG EVERY THREE HOURS "AS NEEDED" FOR BREAKTHROUGH PAIN    [provider]  potassium chloride (K-DUR,KLOR-CON) 10 MEQ tablet Take 10 mEq by mouth daily.  [provider]  senna (SENOKOT) 8.6 MG TABS tablet Take 1 tablet (8.6 mg total) by mouth daily. 04/06/17   [name], Earna Coder, MD  solifenacin (VESICARE) 10 MG tablet Take 1 tablet (10 mg total) by mouth daily. 12/31/16   [name], Earna Coder, MD  UNABLE TO FIND House 2.0/Mes pass: Drink 90 ml's by mouth three times a day for poor meal intake    [provider]    Physical Exam:  Vitals:   04/27/17 1530 04/27/17 1545 04/27/17 1600 04/27/17 1641  BP: (!) 152/68  (!) 154/69 (!) 149/61  Pulse: 74 73 74 78  Resp: 19 17 15 16   Temp:    98.5 F (36.9 C)  TempSrc:    Oral  SpO2: 93% 94% 92% 94%  Weight:      Height:       Constitutional: NAD, ill appearing, lethargic Eyes: PERRL, lids and conjunctivae normal ENMT: Mucous membranes are dry, without exudate or lesions  Neck: normal, supple, no masses, no thyromegaly Respiratory: clear to auscultation bilaterally, no wheezing, no crackles. Normal respiratory effort  Cardiovascular: Regular rate and rhythm, no murmurs, rubs or gallops. No extremity edema. 2+ pedal pulses. No carotid bruits.  Abdomen: Soft, mild epigastric tenderness without guarding, Foley in place Bowel sounds positive.  Musculoskeletal: no clubbing / cyanosis. Moves all extremities. Several extremities with splint and bandage due to fractures  Skin:  Known stage 2 decubitus ulcer  Neurologic: Sensation intact  Strength equal in all extremities.       Labs on Admission: I have personally reviewed following labs and imaging  studies  CBC:  Recent Labs Lab 04/27/17 1146  WBC 11.7*  NEUTROABS 9.6*  HGB 11.4*  HCT 36.6  MCV 94.6  PLT 102    Basic Metabolic Panel:  Recent Labs Lab 04/27/17 1146  NA 140  K 2.9*  CL 97*  CO2 31  GLUCOSE 221*  BUN 31*  CREATININE 0.81  CALCIUM 8.2*    GFR: Estimated Creatinine Clearance: 71.7 mL/min (by C-G formula based on SCr of 0.81 mg/dL).  Liver Function Tests:  Recent Labs Lab 04/27/17 1146  AST 20  ALT 18  ALKPHOS 113  BILITOT 0.6  PROT 6.8  ALBUMIN 2.7*    Recent Labs Lab 04/27/17 1146  LIPASE 22   No results for input(s): AMMONIA in the last 168 hours.  Coagulation Profile: No results for input(s): INR, PROTIME in the last 168 hours.  Cardiac Enzymes: No results for input(s): CKTOTAL, CKMB, CKMBINDEX, TROPONINI in the last 168 hours.  BNP (last 3 results) No results for input(s): PROBNP in the last 8760 hours.  HbA1C: No results for input(s): HGBA1C in the last 72 hours.  CBG: No results for input(s): GLUCAP in the last 168 hours.  Lipid Profile: No results for input(s): CHOL, HDL, LDLCALC, TRIG, CHOLHDL, LDLDIRECT in the last 72 hours.  Thyroid Function Tests: No results for input(s): TSH, T4TOTAL, FREET4, T3FREE, THYROIDAB in the last 72 hours.  Anemia Panel: No results for input(s): VITAMINB12, FOLATE, FERRITIN, TIBC, IRON, RETICCTPCT in the last 72 hours.  Urine analysis:    Component Value Date/Time   COLORURINE YELLOW 04/27/2017 [place] 04/27/2017 1206   LABSPEC 1.024 04/27/2017 1206   PHURINE 6.0 04/27/2017 1206   GLUCOSEU >=500 (A) 04/27/2017 1206   HGBUR SMALL (A) 04/27/2017 1206   BILIRUBINUR NEGATIVE 04/27/2017 1206   KETONESUR 5 (A) 04/27/2017 1206   PROTEINUR 100 (A) 04/27/2017 1206   UROBILINOGEN 0.2 11/27/2013  2012   NITRITE NEGATIVE 04/27/2017 1206   LEUKOCYTESUR MODERATE (A) 04/27/2017 1206    Sepsis Labs: @LABRCNTIP (procalcitonin:4,lacticidven:4) )No results found for  this or any previous visit (from the past 240 hour(s)).   Radiological Exams on Admission: Ct Abdomen Pelvis W Contrast  Result Date: 04/27/2017 CLINICAL DATA:  76 year old female with acute abdominal pain and vomiting. EXAM: CT ABDOMEN AND PELVIS WITH CONTRAST TECHNIQUE: Multidetector CT imaging of the abdomen and pelvis was performed using the standard protocol following bolus administration of intravenous contrast. CONTRAST:  75 cc intravenous Omnipaque 300 COMPARISON:  04/15/2017 and prior CTs FINDINGS: Right hip replacement obscures detail within the pelvis. Lower chest: Bibasilar atelectasis/ scarring again noted. Cardiomegaly and heavy coronary artery calcifications again identified. Hepatobiliary: The liver is unremarkable. Probable cholelithiasis versus gallbladder sludge noted. There is no evidence of biliary dilatation. Pancreas: Atrophic without other significant abnormality Spleen: Unremarkable Adrenals/Urinary Tract: Bilateral adrenal masses/adenomas are unchanged from 2010 with the largest measuring 2.6 cm on the left. Renal cortical atrophy, small left renal cysts and a punctate nonobstructing mid right renal calculus noted. A Foley catheter is present within the bladder. There is no evidence of hydronephrosis. Stomach/Bowel: There is no evidence of bowel obstruction or definite bowel wall thickening. A large amount of rectal stool is present. No inflammatory changes are identified. The appendix is normal. Vascular/Lymphatic: Aortic atherosclerosis. No enlarged abdominal or pelvic lymph nodes. Reproductive: Uterus and bilateral adnexa are unremarkable. Other: No ascites, abscess or pneumoperitoneum. Musculoskeletal: No acute abnormalities. Right total hip replacement, remote L1 compression fracture and degenerative changes within the lumbar spine again noted. IMPRESSION: No evidence of acute abnormality. Large amount of rectal stool. Cholelithiasis versus gallbladder sludge. Cardiomegaly and  coronary artery disease. Aortic Atherosclerosis (ICD10-I70.0). Electronically Signed   By: Margarette Canada M.D.   On: 04/27/2017 15:28   Dg Abd Acute W/chest  Result Date: 04/27/2017 CLINICAL DATA:  Abdominal pain and vomiting EXAM: DG ABDOMEN ACUTE W/ 1V CHEST COMPARISON:  04/15/2017 FINDINGS: Cardiac shadow is again enlarged. Aortic calcifications are again seen. Scarring is noted in the bases bilaterally stable from the prior exam. Scattered large and small bowel gas is noted. No abnormal mass or abnormal calcifications are noted. Right hip replacement is seen. No free air is noted. Degenerative changes of the lumbar spine are seen with compression deformity of L1 of chronic nature. IMPRESSION: Chronic changes without acute abnormality. Electronically Signed   By: Inez Catalina M.D.   On: 04/27/2017 13:09    EKG: Independently reviewed.  Assessment/Plan Active Problems:   UTI (urinary tract infection)   Breast cancer of upper-outer quadrant of right female breast (HCC)   Diabetes ([place])   Osteoporosis   Hyperlipidemia   Essential hypertension   Multiple fractures   Leukocytosis   Dehydration   Anemia   Constipation   Failure to thrive (0-17)   Hypokalemia  Intractable Nausea and vomiting and abdominal pain . Viral versus bacterial versus constipation vs  other etiology. 5 episodes prior to admission, received IV Zofran at the ED with minimal comfort Afebrile, . Mild leukocytosis.11.7  CT abdomen negative for acute abnormalities, and large amount of stool seen suspect DM gastroparesis) . Incidental cholelithiasis  Admit to telemetry Bowel rest, advance as tolerated  IV  Zofran  IV fluids IV antiemetics  Enema x1 for Bowel evacuation and comfort . Careful use of narcotics  Lactic acid x1    UTI suggested by  Moderate leukocytes in urine. History of bladder outlet obstruction, currently on a  catheter, last changed on 6.25  WBC is 11.7  . Received IV Rocephin  In ED. IVF 1 L     F/u  urine culture Ceftriaxone IV  Follow CBC in am  IVF   Hypokalemia, likely due to vomiting. EKG pending, admission K was 2.9, received 30 meq K.  KCL 40 meq  Check Mg EKG q 8 h x3  Repeat CMET in am    Hypertension BP   149/61   Pulse 78    Controlled Continue home anti-hypertensive medications   Hydralazine prn IV for BP 160/90  Type II Diabetes Current blood sugar level is 221 Lab Results  Component Value Date   HGBA1C 9.5 01/06/2017  Hold home oral diabetic medications.   SSI Heart healthy carb modified diet.    Failure to thrive. Albumin 2.7  Continue Remeron when able to eat  IV Fluids nutrition consult Check prealbumin  palliative care consult to determine goals of care    Depression Continue home  Celexa, Enablex    DVT prophylaxis: Heparin  Code Status:   Full    Family Communication:  Discussed with patient Disposition Plan: Expect patient to be discharged to nursing home after condition improves Consults called:   Palliative Care   Admission status:Tele   Obs    [facility], PA-C [facility]   04/27/2017, 5:25 PM

## 2017-04-27 NOTE — Progress Notes (Signed)
New Admission Note:   Arrival Method: Bed Mental Orientation: A&O X4 Telemetry: Initiated Assessment: Completed Skin: See flowsheets IV: Clean, Dry, Intact, Infusing Pain: 5/10 Safety Measures: Safety Fall Prevention Plan has been given, discussed and signed Admission: Completed Unit Orientation: Patient has been orientated to the room, unit and staff.  Family: daughter at bedside  Orders have been reviewed and implemented. Will continue to monitor the patient. Call light has been placed within reach and bed alarm has been activated.    Dixie Dials RN, BSN

## 2017-04-27 NOTE — ED Notes (Signed)
Pt arrives with foley in place that daughter states was laced yesterday.

## 2017-04-27 NOTE — ED Notes (Signed)
Got patient on the monitor patient is resting with family at beside

## 2017-04-27 NOTE — ED Notes (Signed)
Daughter leaves stating she must return to work.

## 2017-04-27 NOTE — ED Notes (Signed)
Pt returns from ct and pt asked about cmplaints through language line. Interpretor. Pt c/o continued nausea and c/o pain at left leg. Will inform MD.

## 2017-04-28 ENCOUNTER — Observation Stay (HOSPITAL_COMMUNITY): Payer: Medicare Other

## 2017-04-28 DIAGNOSIS — Z7982 Long term (current) use of aspirin: Secondary | ICD-10-CM | POA: Diagnosis not present

## 2017-04-28 DIAGNOSIS — B961 Klebsiella pneumoniae [K. pneumoniae] as the cause of diseases classified elsewhere: Secondary | ICD-10-CM | POA: Diagnosis present

## 2017-04-28 DIAGNOSIS — R112 Nausea with vomiting, unspecified: Secondary | ICD-10-CM

## 2017-04-28 DIAGNOSIS — N390 Urinary tract infection, site not specified: Secondary | ICD-10-CM | POA: Diagnosis present

## 2017-04-28 DIAGNOSIS — D649 Anemia, unspecified: Secondary | ICD-10-CM | POA: Diagnosis present

## 2017-04-28 DIAGNOSIS — E876 Hypokalemia: Secondary | ICD-10-CM | POA: Diagnosis present

## 2017-04-28 DIAGNOSIS — Z794 Long term (current) use of insulin: Secondary | ICD-10-CM | POA: Diagnosis not present

## 2017-04-28 DIAGNOSIS — E118 Type 2 diabetes mellitus with unspecified complications: Secondary | ICD-10-CM | POA: Diagnosis not present

## 2017-04-28 DIAGNOSIS — L89152 Pressure ulcer of sacral region, stage 2: Secondary | ICD-10-CM | POA: Diagnosis present

## 2017-04-28 DIAGNOSIS — M810 Age-related osteoporosis without current pathological fracture: Secondary | ICD-10-CM | POA: Diagnosis present

## 2017-04-28 DIAGNOSIS — I10 Essential (primary) hypertension: Secondary | ICD-10-CM | POA: Diagnosis present

## 2017-04-28 DIAGNOSIS — C50411 Malignant neoplasm of upper-outer quadrant of right female breast: Secondary | ICD-10-CM | POA: Diagnosis present

## 2017-04-28 DIAGNOSIS — Z96641 Presence of right artificial hip joint: Secondary | ICD-10-CM | POA: Diagnosis present

## 2017-04-28 DIAGNOSIS — Z79899 Other long term (current) drug therapy: Secondary | ICD-10-CM | POA: Diagnosis not present

## 2017-04-28 DIAGNOSIS — N3000 Acute cystitis without hematuria: Secondary | ICD-10-CM | POA: Diagnosis not present

## 2017-04-28 DIAGNOSIS — H269 Unspecified cataract: Secondary | ICD-10-CM | POA: Diagnosis present

## 2017-04-28 DIAGNOSIS — Z7984 Long term (current) use of oral hypoglycemic drugs: Secondary | ICD-10-CM | POA: Diagnosis not present

## 2017-04-28 DIAGNOSIS — E785 Hyperlipidemia, unspecified: Secondary | ICD-10-CM | POA: Diagnosis present

## 2017-04-28 DIAGNOSIS — E119 Type 2 diabetes mellitus without complications: Secondary | ICD-10-CM | POA: Diagnosis present

## 2017-04-28 DIAGNOSIS — E440 Moderate protein-calorie malnutrition: Secondary | ICD-10-CM | POA: Diagnosis present

## 2017-04-28 DIAGNOSIS — E860 Dehydration: Secondary | ICD-10-CM | POA: Diagnosis present

## 2017-04-28 DIAGNOSIS — N3281 Overactive bladder: Secondary | ICD-10-CM | POA: Diagnosis present

## 2017-04-28 LAB — CBC
HCT: 34.6 % — ABNORMAL LOW (ref 36.0–46.0)
Hemoglobin: 10.7 g/dL — ABNORMAL LOW (ref 12.0–15.0)
MCH: 29.1 pg (ref 26.0–34.0)
MCHC: 30.9 g/dL (ref 30.0–36.0)
MCV: 94 fL (ref 78.0–100.0)
PLATELETS: 213 10*3/uL (ref 150–400)
RBC: 3.68 MIL/uL — AB (ref 3.87–5.11)
RDW: 13.1 % (ref 11.5–15.5)
WBC: 9.5 10*3/uL (ref 4.0–10.5)

## 2017-04-28 LAB — COMPREHENSIVE METABOLIC PANEL
ALBUMIN: 2.3 g/dL — AB (ref 3.5–5.0)
ALT: 13 U/L — AB (ref 14–54)
AST: 14 U/L — AB (ref 15–41)
Alkaline Phosphatase: 97 U/L (ref 38–126)
Anion gap: 9 (ref 5–15)
BUN: 14 mg/dL (ref 6–20)
CHLORIDE: 100 mmol/L — AB (ref 101–111)
CO2: 29 mmol/L (ref 22–32)
CREATININE: 0.67 mg/dL (ref 0.44–1.00)
Calcium: 7.8 mg/dL — ABNORMAL LOW (ref 8.9–10.3)
GFR calc non Af Amer: >60 mL/min (ref >60)
Glucose, Bld: 140 mg/dL — ABNORMAL HIGH (ref 65–99)
Potassium: 3 mmol/L — ABNORMAL LOW (ref 3.5–5.1)
SODIUM: 138 mmol/L (ref 135–145)
Total Bilirubin: 0.4 mg/dL (ref 0.3–1.2)
Total Protein: 5.7 g/dL — ABNORMAL LOW (ref 6.5–8.1)

## 2017-04-28 LAB — GLUCOSE, CAPILLARY
GLUCOSE-CAPILLARY: 136 mg/dL — AB (ref 65–99)
GLUCOSE-CAPILLARY: 118 mg/dL — AB (ref 65–99)
Glucose-Capillary: 125 mg/dL — ABNORMAL HIGH (ref 65–99)
Glucose-Capillary: 100 mg/dL — ABNORMAL HIGH (ref 65–99)

## 2017-04-28 MED ORDER — POTASSIUM CHLORIDE CRYS ER 20 MEQ PO TBCR
20.0000 meq | EXTENDED_RELEASE_TABLET | Freq: Once | ORAL | Status: AC
  Administered 2017-04-28: 20 meq via ORAL
  Filled 2017-04-28: qty 1

## 2017-04-28 MED ORDER — POTASSIUM CHLORIDE 10 MEQ/100ML IV SOLN
10.0000 meq | INTRAVENOUS | Status: AC
  Administered 2017-04-28: 10 meq via INTRAVENOUS

## 2017-04-28 MED ORDER — SODIUM CHLORIDE 0.9% FLUSH: 10.0000 mL | INTRAVENOUS | Status: DC | PRN

## 2017-04-28 MED ORDER — POTASSIUM CHLORIDE 10 MEQ/100ML IV SOLN
10.0000 meq | Freq: Once | INTRAVENOUS | Status: AC
  Administered 2017-04-28: 10 meq via INTRAVENOUS

## 2017-04-28 MED ORDER — OXYCODONE HCL 5 MG PO TABS
5.0000 mg | ORAL_TABLET | Freq: Four times a day (QID) | ORAL | Status: DC
  Administered 2017-04-28 – 2017-04-30 (×5): 5 mg via ORAL
  Filled 2017-04-28 (×10): qty 1

## 2017-04-28 NOTE — Progress Notes (Signed)
Initial Nutrition Assessment  DOCUMENTATION CODES:   Obesity unspecified  INTERVENTION:   -Diet advancement as tolerated  -Addition of nutritional supplement once diet advanced   NUTRITION DIAGNOSIS:   Inadequate oral intake related to vomiting, acute illness as evidenced by per patient/family report.  GOAL:   Patient will meet greater than or equal to 90% of their needs  MONITOR:   Diet advancement, PO intake, Labs, Weight trends, Skin, Supplement acceptance  REASON FOR ASSESSMENT:   Consult Assessment of nutrition requirement/status  ASSESSMENT:   76 yo female admitted with intractable N/V, FTT. Pt with recent b/l forearm fractures (both arms in casts).  Pt with hx of DM, HTN, HL, sacral ulcer, breast cancer, osteopeorosis  Not tolerating CL on visit today. Daughter at bedside. Daughter reports pt has been vomiting off and on for 2 weeks with abdominal pain.  Unable to tolerate much during episodes of vomiting but during 2 week period, pt had days where she was ravenous and ate very well.  Nutrition-Focused physical exam completed. Findings are no fat depletion, mild muscle depletion in LE only, and mild edema.   Daughter unsure if pt has lost any weight recently. Per wt encounters, weight has been relatively stable.   Wt Readings from Last 10 Encounters:  04/27/17 220 lb (99.8 kg)  04/15/17 220 lb (99.8 kg)  04/12/17 219 lb (99.3 kg)  03/28/17 224 lb 15.7 oz (102 kg)  01/22/17 217 lb (98.4 kg)  12/31/16 219 lb 12.8 oz (99.7 kg)  10/12/16 220 lb (99.8 kg)  08/03/16 220 lb (99.8 kg)  10/02/15 227 lb 14.4 oz (103.4 kg)  08/07/14 224 lb 11.2 oz (101.9 kg)   Labs: potassium 3.0, prealbumin 16.2 Meds: NS at 100 ml/hr, lasix, ss novolog, levemir, remeron, KCl  Diet Order:  Diet clear liquid Room service appropriate? Yes; Fluid consistency: Thin  Skin:  Reviewed, no issues  Last BM:  04/28/17  Height:   Ht Readings from Last 1 Encounters:  04/27/17 5\' 7"   (1.702 m)    Weight:   Wt Readings from Last 1 Encounters:  04/27/17 220 lb (99.8 kg)    Ideal Body Weight:  67.3 kg  BMI:  Body mass index is 34.46 kg/m.  Estimated Nutritional Needs:   Kcal:  1900-2100 kcals  Protein:  100-110 g  Fluid:  >/= 2 L  EDUCATION NEEDS:   No education needs identified at this time  [place], [place], LDN [phone] Pager  [phone] Weekend/On-Call Pager

## 2017-04-28 NOTE — Progress Notes (Signed)
Patient ID: Christy [name], female   DOB: 1941/07/06, 76 y.o.   MRN: [id number]                                                                PROGRESS NOTE                                                                                                                                                                                                             Patient Demographics:    Christy [name], is a 76 y.o. female, DOB - 31-Mar-1941, WCH:[id number]  Admit date - 04/27/2017   Admitting Physician Waldemar Dickens, MD  Outpatient Primary MD for the patient is [name], MD  LOS - 0  Outpatient Specialists:    Chief Complaint  Patient presents with  . Emesis       Brief Narrative  76 y.o. female from [place] with medical history significant for DM, HTN, HLD, sacral ulcer, recent bilateral forearms fracture, R arm fracture due to osteoporosis and falls, priorly seen at the ED 1 week ago with possible bladder outlet obstruction, d/c with foley, which was changed on 6/25. In the interim, patient's overall status has declined, with FTT, and vomiting about 5 times prior to admission . History was obtained by EDP, chart and is limited due to  language barrier and overal clinical status. No food poisoning. Denies fevers, chills, night sweats, vision changes  Denies any respiratory complaints. Denies any chest pain or palpitations. Denies lower extremity swelling. She reports abdominal pain and constipation . Denies abnormal skin rashes  Denies any bleeding issues such as epistaxis, hematemesis, hematuria or hematochezia.     Subjective:    Christy [name] today states that her n/v improved.  Has had multiple bm this am.  Its unclear if she has abdominal pain.  .   No headache, No chest pain, No new weakness tingling or numbness, No Cough - SOB.    Assessment  & Plan :    Active Problems:   Breast cancer of upper-outer quadrant of right female breast ([place])   Diabetes ([place])   Osteoporosis   Hyperlipidemia   Essential hypertension   Multiple fractures   Leukocytosis   Dehydration   Anemia   UTI (urinary tract infection)   Constipation   Failure to  thrive (0-17)   Hypokalemia   Moderate protein-calorie malnutrition (HCC)   Diabetes mellitus with complication (HCC)   Intractable Nausea and vomiting and abdominal pain . Viral versus bacterial versus constipation vs  other etiology. 5 episodes prior to admission, received IV Zofran at the ED with minimal comfort Afebrile, . Mild leukocytosis.11.7  CT abdomen negative for acute abnormalities, and large amount of stool seen suspect DM gastroparesis) . Incidental cholelithiasis  RUQ ultrasound  Bowel rest, advance as tolerated  IV  Zofran  IV fluids IV antiemetics  Enema x1 for Bowel evacuation and comfort . Careful use of narcotics  Lactic acid x1    UTI suggested by  Moderate leukocytes in urine. History of bladder outlet obstruction, currently on a catheter, last changed on 6.25  WBC is 11.7  . Received IV Rocephin  In ED. IVF 1 L   F/u urine culture Ceftriaxone IV  CBC in am  IVF   Hypokalemia, likely due to vomiting. EKG pending, admission K was 2.9, received 30 meq K.  KCL 40 meq  Check Mg EKG q 8 h x3  CMET in am   Hypertension BP   149/61   Pulse 78    Controlled Continue home anti-hypertensive medications   Hydralazine prn IV for BP 160/90  Type II Diabetes Current blood sugar level is 221 Recent Labs       Lab Results  Component Value Date   HGBA1C 9.5 01/06/2017    Hold home oral diabetic medications.   SSI Heart healthy carb modified diet.    Failure to thrive. Albumin 2.7  Continue Remeron when able to eat  IV Fluids nutrition consult Check prealbumin  palliative care consult to determine goals of care    Depression Continue home  Celexa, Enablex    DVT prophylaxis: Heparin  Code Status:   Full    Family Communication:  Discussed with patient Disposition Plan:  Expect patient to be discharged to nursing home after condition improves Consults called:   Palliative Care   Admission status:Tele   Obs  .    Lab Results  Component Value Date   PLT 230 04/27/2017     Anti-infectives    Start     Dose/Rate Route Frequency Ordered Stop   04/28/17 1200  cefTRIAXone (ROCEPHIN) 1 g in dextrose 5 % 50 mL IVPB     1 g 100 mL/hr over 30 Minutes Intravenous Every 24 hours 04/27/17 1720     04/27/17 1245  cefTRIAXone (ROCEPHIN) 1 g in dextrose 5 % 50 mL IVPB     1 g 100 mL/hr over 30 Minutes Intravenous  Once 04/27/17 1236 04/27/17 1507        Objective:   Vitals:   04/27/17 1641 04/27/17 1757 04/27/17 2212 04/28/17 0639  BP: (!) 149/61 (!) 154/66 (!) 170/75 (!) 155/74  Pulse: 78 80 82 72  Resp: 16 16 18 20   Temp: 98.5 F (36.9 C) 97.5 F (36.4 C) 98.6 F (37 C) 98.4 F (36.9 C)  TempSrc: Oral Oral Oral Oral  SpO2: 94% 100% 95% 95%  Weight:      Height:        Wt Readings from Last 3 Encounters:  04/27/17 99.8 kg (220 lb)  04/15/17 99.8 kg (220 lb)  04/12/17 99.3 kg (219 lb)     Intake/Output Summary (Last 24 hours) at 04/28/17 5188 Last data filed at 04/28/17 0700  Gross per 24 hour  Intake  1348.34 ml  Output             1950 ml  Net          -601.66 ml     Physical Exam  Awake Alert, Oriented X 3 ?, No new F.N deficits, Normal affect [place].AT,PERRAL Supple Neck,No JVD, No cervical lymphadenopathy appriciated.  Symmetrical Chest wall movement, Good air movement bilaterally, CTAB RRR,No Gallops,Rubs or new Murmurs, No Parasternal Heave +ve B.Sounds, Abd Soft, No tenderness, No organomegaly appriciated, No rebound - guarding or rigidity. No Cyanosis, Clubbing or edema, No new Rash or bruise  Negative murphy Arms and legs wrapped    Data Review:    CBC  Recent Labs Lab 04/27/17 1146  WBC 11.7*  HGB 11.4*  HCT 36.6  PLT 230  MCV 94.6  MCH 29.5  MCHC 31.1  RDW 13.2  LYMPHSABS 1.1  MONOABS 0.9  EOSABS  0.0  BASOSABS 0.0    Chemistries   Recent Labs Lab 04/27/17 1146 04/27/17 1824  NA 140  --   K 2.9*  --   CL 97*  --   CO2 31  --   GLUCOSE 221*  --   BUN 31*  --   CREATININE 0.81  --   CALCIUM 8.2*  --   MG  --  2.0  AST 20  --   ALT 18  --   ALKPHOS 113  --   BILITOT 0.6  --    ------------------------------------------------------------------------------------------------------------------ No results for input(s): CHOL, HDL, LDLCALC, TRIG, CHOLHDL, LDLDIRECT in the last 72 hours.  Lab Results  Component Value Date   HGBA1C 9.5 01/06/2017   ------------------------------------------------------------------------------------------------------------------ No results for input(s): TSH, T4TOTAL, T3FREE, THYROIDAB in the last 72 hours.  Invalid input(s): FREET3 ------------------------------------------------------------------------------------------------------------------ No results for input(s): VITAMINB12, FOLATE, FERRITIN, TIBC, IRON, RETICCTPCT in the last 72 hours.  Coagulation profile No results for input(s): INR, PROTIME in the last 168 hours.  No results for input(s): DDIMER in the last 72 hours.  Cardiac Enzymes No results for input(s): CKMB, TROPONINI, MYOGLOBIN in the last 168 hours.  Invalid input(s): CK ------------------------------------------------------------------------------------------------------------------    Component Value Date/Time   BNP 14.0 03/28/2017 1645    Inpatient Medications  Scheduled Meds: . amLODipine  10 mg Oral Daily  . aspirin EC  81 mg Oral Daily  . atorvastatin  20 mg Oral QHS  . citalopram  20 mg Oral Daily  . darifenacin  7.5 mg Oral Daily  . docusate sodium  100 mg Oral BID  . fenofibrate  54 mg Oral Daily  . furosemide  40 mg Oral Daily  . heparin  5,000 Units Subcutaneous Q8H  . insulin aspart  0-9 Units Subcutaneous TID WC  . insulin detemir  26 Units Subcutaneous QHS  . lactulose  300 mL Rectal Once    . lisinopril  40 mg Oral Daily  . mirtazapine  7.5 mg Oral QHS  . oxyCODONE  5 mg Oral Q6H  . potassium chloride  10 mEq Oral Daily  . senna  1 tablet Oral Daily  . sodium chloride flush  3 mL Intravenous Q12H   Continuous Infusions: . sodium chloride 100 mL/hr at 04/27/17 2235  . cefTRIAXone (ROCEPHIN)  IV     PRN Meds:.acetaminophen **OR** acetaminophen, bisacodyl, hydrALAZINE, ketorolac, ondansetron **OR** ondansetron (ZOFRAN) IV, senna-docusate  Micro Results No results found for this or any previous visit (from the past 240 hour(s)).  Radiology Reports Ct Abdomen Pelvis Wo Contrast  Result Date: 04/15/2017 CLINICAL  DATA:  Possible free air on chest x-ray earlier today. EXAM: CT ABDOMEN AND PELVIS WITHOUT CONTRAST TECHNIQUE: Multidetector CT imaging of the abdomen and pelvis was performed following the standard protocol without IV contrast. COMPARISON:  06/27/2010 FINDINGS: Lower chest: Subsegmental atelectasis at the lung bases. Heart is enlarged with coronary artery calcification evident. Hepatobiliary: No focal abnormality in the liver on this study without intravenous contrast. There is no evidence for gallstones, gallbladder wall thickening, or pericholecystic fluid. Extrahepatic common duct is dilated at 13 mm without etiology evident. No associated intrahepatic biliary duct dilatation. Pancreas: Insert pancreas Spleen: No splenomegaly. No focal mass lesion. Adrenals/Urinary Tract: Stable thickening right adrenal gland with attenuation suggesting adenoma. 2.6 cm left adrenal nodule cannot be characterized as an adenoma today based on attenuation, but was present on the study from 7 years ago and measured 2.3 cm at that time, most compatible with a benign etiology. 2 mm nonobstructing stone identified interpolar right kidney. Left kidney unremarkable. No evidence for hydroureter. Urinary bladder is markedly distended with the dome at the level of the umbilicus. Stomach/Bowel: Stomach is  nondistended. No gastric wall thickening. No evidence of outlet obstruction. Duodenum is normally positioned as is the ligament of Treitz. No small bowel wall thickening. No small bowel dilatation. The terminal ileum is normal. The appendix is normal. No gross colonic mass. No colonic wall thickening. No substantial diverticular change. Vascular/Lymphatic: There is abdominal aortic atherosclerosis without aneurysm. There is no gastrohepatic or hepatoduodenal ligament lymphadenopathy. No intraperitoneal or retroperitoneal lymphadenopathy. No pelvic sidewall lymphadenopathy. Reproductive: The uterus has normal CT imaging appearance. There is no adnexal mass. Other: No intraperitoneal free fluid. Musculoskeletal: Right hip replacement generates streak artifact obscuring the inferior right anatomic pelvis. Bone windows reveal no worrisome lytic or sclerotic osseous lesions. Mild compression deformity noted at L1 similar to prior. IMPRESSION: 1. No intraperitoneal free air. Appearance on x-ray earlier today presumably related to basilar atelectasis. 2. Marked bladder distention. Component of bladder outlet obstruction suggested by CT. 3. Mild extrahepatic biliary duct dilatation of indeterminate etiology. Correlation with liver function test may prove helpful. 4. Bilateral adrenal nodules, with right adrenal nodule attenuation compatible with adenoma. Larger left adrenal nodule is similar to the study from 7 years ago suggesting benign etiology such as lipid poor adenoma. 5. Tiny nonobstructing right renal stone. 6.  Aortic Atherosclerois (ICD10-170.0) Electronically Signed   By: Misty Stanley M.D.   On: 04/15/2017 19:48   Dg Chest 2 View  Result Date: 04/15/2017 CLINICAL DATA:  Altered mental status, cough and weakness EXAM: CHEST  2 VIEW COMPARISON:  Portable chest x-ray of 04/03/2017 FINDINGS: The lungs are not well aerated with persistent bibasilar atelectasis and/or scarring. However on the frontal view which is  semi-erect, free air cannot be excluded under the right hemidiaphragm. Left lateral decubitus of the abdomen is recommended versus CT of the abdomen which is more sensitive to exclude free air. Mediastinal and hilar contours are unchanged and cardiomegaly is stable. Probable old right lateral rib fractures are noted. There are degenerative changes diffusely throughout the thoracic spine. IMPRESSION: 1. Lucency under the right hemidiaphragm. Cannot exclude free intraperitoneal air. Recommend either left lateral decubitus of the abdomen or CT of the abdomen pelvis to exclude free air. 2. Persistent bibasilar atelectasis or scarring. Critical Value/emergent results were called by telephone at the time of interpretation on 04/15/2017 at 5:12 pm to PA NICOLE NADEAU , who verbally acknowledged these results. Electronically Signed   By: Windy Canny.D.  On: 04/15/2017 17:12   Ct Abdomen Pelvis W Contrast  Result Date: 04/27/2017 CLINICAL DATA:  76 year old female with acute abdominal pain and vomiting. EXAM: CT ABDOMEN AND PELVIS WITH CONTRAST TECHNIQUE: Multidetector CT imaging of the abdomen and pelvis was performed using the standard protocol following bolus administration of intravenous contrast. CONTRAST:  75 cc intravenous Omnipaque 300 COMPARISON:  04/15/2017 and prior CTs FINDINGS: Right hip replacement obscures detail within the pelvis. Lower chest: Bibasilar atelectasis/ scarring again noted. Cardiomegaly and heavy coronary artery calcifications again identified. Hepatobiliary: The liver is unremarkable. Probable cholelithiasis versus gallbladder sludge noted. There is no evidence of biliary dilatation. Pancreas: Atrophic without other significant abnormality Spleen: Unremarkable Adrenals/Urinary Tract: Bilateral adrenal masses/adenomas are unchanged from 2010 with the largest measuring 2.6 cm on the left. Renal cortical atrophy, small left renal cysts and a punctate nonobstructing mid right renal calculus  noted. A Foley catheter is present within the bladder. There is no evidence of hydronephrosis. Stomach/Bowel: There is no evidence of bowel obstruction or definite bowel wall thickening. A large amount of rectal stool is present. No inflammatory changes are identified. The appendix is normal. Vascular/Lymphatic: Aortic atherosclerosis. No enlarged abdominal or pelvic lymph nodes. Reproductive: Uterus and bilateral adnexa are unremarkable. Other: No ascites, abscess or pneumoperitoneum. Musculoskeletal: No acute abnormalities. Right total hip replacement, remote L1 compression fracture and degenerative changes within the lumbar spine again noted. IMPRESSION: No evidence of acute abnormality. Large amount of rectal stool. Cholelithiasis versus gallbladder sludge. Cardiomegaly and coronary artery disease. Aortic Atherosclerosis (ICD10-I70.0). Electronically Signed   By: Margarette Canada M.D.   On: 04/27/2017 15:28   Dg Chest Port 1 View  Result Date: 04/03/2017 CLINICAL DATA:  Breast cancer, hypertension, diabetes, hypoxia EXAM: PORTABLE CHEST 1 VIEW COMPARISON:  03/08/2017 FINDINGS: Similar cardiomegaly with chronic vascular congestion and interstitial prominence. Increased bibasilar atelectasis. No large effusion or pneumothorax. Degenerative changes of the spine. Aorta is atherosclerotic and ectatic. Bones are osteopenic. IMPRESSION: Similar cardiomegaly with chronic vascular congestion and interstitial prominence. Increased bibasilar atelectasis. Electronically Signed   By: Jerilynn Mages.  Shick M.D.   On: 04/03/2017 13:17   Dg Abd Acute W/chest  Result Date: 04/27/2017 CLINICAL DATA:  Abdominal pain and vomiting EXAM: DG ABDOMEN ACUTE W/ 1V CHEST COMPARISON:  04/15/2017 FINDINGS: Cardiac shadow is again enlarged. Aortic calcifications are again seen. Scarring is noted in the bases bilaterally stable from the prior exam. Scattered large and small bowel gas is noted. No abnormal mass or abnormal calcifications are noted.  Right hip replacement is seen. No free air is noted. Degenerative changes of the lumbar spine are seen with compression deformity of L1 of chronic nature. IMPRESSION: Chronic changes without acute abnormality. Electronically Signed   By: Inez Catalina M.D.   On: 04/27/2017 13:09    Time Spent in minutes  30   Jani Gravel M.D on 04/28/2017 at 7:22 AM  Between 7am to 7pm - Pager - [phone]  After 7pm go to [email or web address] - password [facility]  [facility] -  Office  [phone]

## 2017-04-28 NOTE — Care Management Note (Addendum)
Case Management Note  Patient Details  Name: Christy [name] MRN: [id number] Date of Birth: April 20, 1941  Subjective/Objective:                 Spoke with daughter over the phone. She states that patient has been in [facility], plans on returning at [place] consult placed.   Action/Plan:  6/29- DC to SNF today as facilitated by CSW.  Expected Discharge Date:                  Expected Discharge Plan:  Skilled Nursing Facility  In-House Referral:  Clinical Social Work  Discharge planning Services  CM Consult  Post Acute Care Choice:    Choice offered to:     DME Arranged:    DME Agency:     HH Arranged:    [place] Agency:     Status of Service:  Completed, signed off  If discussed at [employer], dates discussed:    Additional Comments:  Carles Collet, RN 04/28/2017, 2:25 PM

## 2017-04-28 NOTE — Plan of Care (Addendum)
Thank you for this consult to palliative care.  We have reviewed the chart in detail.  However due to current high consult volumes, there may be a delay in our response to this consult request. We have noted this patient is under observation status and the plan is for her to return to SNF. You may request Palliative consultation be done at SNF after discharge by placing this request in the discharge summary and instructions.  Please feel free to contact the team by phone if there are urgent needs or her condition deteriorates. [phone].  A provider on our team will complete this consult as soon as someone is available. Thank you.  [place] Practice Administrator [phone]

## 2017-04-28 NOTE — Care Management Obs Status (Signed)
[place] NOTIFICATION   Patient Details  Name: Melitta Tigue MRN: [id number] Date of Birth: 1941/05/09   Medicare Observation Status Notification Given:  Yes    Carles Collet, RN 04/28/2017, 2:26 PM

## 2017-04-28 NOTE — Progress Notes (Signed)
Earlier in shift patient was drowsy, responding to voice and only oriented to self (used interpretor). No complaints of pain, scheduled oxy held. MD made aware. Patient is now more alert, with no c/o pain, but continues to c/o n/v with any PO intake

## 2017-04-28 NOTE — Evaluation (Signed)
Occupational Therapy Evaluation Patient Details Name: Christy [name] MRN: [id number] DOB: [birth year] Today's Date: 04/28/2017    History of Present Illness Pt is a 76 y.o. female presenting with FTT and vomiting. PMHx: DM, HTN, HLD, sacral ulcer, recent bil forearm fxs.   Clinical Impression   Unable to obtain PLOF information from pt. Currently pt requires max-total assist for ADL and bed mobility. Pt requiring max assist for sitting balance due to strong posterior lean. Recommend SNF upon d/c to maximize independence and safety with ADL and functional mobility. Pt would benefit from continued skilled OT to address established goals.    Follow Up Recommendations  SNF;Supervision/Assistance - 24 hour    Equipment Recommendations  Other (comment) (TBD at next venue)    Recommendations for Other Services PT consult     Precautions / Restrictions Precautions Precautions: Fall Restrictions Weight Bearing Restrictions: No Other Position/Activity Restrictions: No orders for WB restrictions but pt with recent bil UE fxs.      Mobility Bed Mobility Overal bed mobility: Needs Assistance Bed Mobility: Supine to Sit;Sit to Supine     Supine to sit: Max assist Sit to supine: Max assist   General bed mobility comments: Assist for LEs, trunk elevation, and scooting hips out to EOB with use of bed pad. Pt attempting to assist with moving LEs and bil UEs but unsuccessful  Transfers                 General transfer comment: Not attempted at this time    Balance Overall balance assessment: Needs assistance;History of Falls Sitting-balance support: Feet supported;Bilateral upper extremity supported Sitting balance-Leahy Scale: Zero Sitting balance - Comments: Max assist to maintain sitting balance with strong posterior lean in sitting Postural control: Posterior lean                                 ADL either performed or assessed with clinical judgement   ADL  Overall ADL's : Needs assistance/impaired                                       General ADL Comments: Pt currently max assist for ADL and bed mobility at this time.      Vision         Perception     Praxis      Pertinent Vitals/Pain Pain Assessment: No/denies pain     Hand Dominance Right   Extremity/Trunk Assessment Upper Extremity Assessment Upper Extremity Assessment: RUE deficits/detail;LUE deficits/detail;Generalized weakness RUE Deficits / Details: bil forearm fxs RUE: Unable to fully assess due to immobilization LUE Deficits / Details: bil forearm fxs LUE: Unable to fully assess due to immobilization   Lower Extremity Assessment Lower Extremity Assessment: Defer to PT evaluation       Communication Communication Communication: Prefers language other than English   Cognition Arousal/Alertness: Awake/alert Behavior During Therapy: Flat affect Overall Cognitive Status: No family/caregiver present to determine baseline cognitive functioning                                     General Comments       Exercises     Shoulder Instructions      Home Living Family/patient expects to be discharged to:: Skilled nursing facility  Prior Functioning/Environment          Comments: Unsure, pt unable to provide information and no family present.        OT Problem List: Decreased strength;Decreased range of motion;Decreased activity tolerance;Impaired balance (sitting and/or standing);Decreased knowledge of use of DME or AE;Decreased knowledge of precautions;Obesity;Impaired UE functional use;Increased edema;Pain      OT Treatment/Interventions: Self-care/ADL training;Therapeutic exercise;Energy conservation;DME and/or AE instruction;Therapeutic activities;Patient/family education;Balance training    OT Goals(Current goals can be found in the care plan section) Acute Rehab OT  Goals Patient Stated Goal: none stated OT Goal Formulation: With patient Time For Goal Achievement: 05/12/17 Potential to Achieve Goals: Fair ADL Goals Pt Will Perform Grooming: with min guard assist;sitting (x3 tasks) Pt Will Transfer to Toilet: with max assist;stand pivot transfer;bedside commode Additional ADL Goal #1: Pt will perform bed mobility with mod assist as precursor to ADL. Additional ADL Goal #2: Pt will sit EOB x10 minutes with min guard assist as precursor to ADL.  OT Frequency: Min 2X/week   Barriers to D/C:            Co-evaluation              AM-PAC PT "6 Clicks" Daily Activity     Outcome Measure Help from another person eating meals?: A Lot Help from another person taking care of personal grooming?: A Lot Help from another person toileting, which includes using toliet, bedpan, or urinal?: Total Help from another person bathing (including washing, rinsing, drying)?: Total Help from another person to put on and taking off regular upper body clothing?: Total Help from another person to put on and taking off regular lower body clothing?: Total 6 Click Score: 8   End of Session    Activity Tolerance: Patient tolerated treatment well Patient left: in bed;with call bell/phone within reach;with bed alarm set  OT Visit Diagnosis: Repeated falls (R29.6);Other abnormalities of gait and mobility (R26.89);Muscle weakness (generalized) (M62.81)                Time: 8338-2505 OT Time Calculation (min): 12 min Charges:  OT General Charges $OT Visit: 1 Procedure OT Evaluation $OT Eval Moderate Complexity: 1 Procedure G-Codes: OT G-codes **NOT FOR INPATIENT CLASS** Functional Assessment Tool Used: AM-PAC 6 Clicks Daily Activity Functional Limitation: Self care Self Care Current Status (L9767): At least 80 percent but less than 100 percent impaired, limited or restricted Self Care Goal Status (H4193): At least 40 percent but less than 60 percent impaired, limited  or restricted   Mel Almond A. Ulice Brilliant, M.S., OTR/L Pager: [phone] 04/28/2017, 2:18 PM

## 2017-04-28 NOTE — Progress Notes (Signed)
   04/28/17 0900  Clinical Encounter Type  Visited With Patient;Health care provider  Visit Type Initial;Spiritual support  Referral From Physician  Consult/Referral To Chaplain  Spiritual Encounters  Spiritual Needs Emotional  Stress Factors  Patient Stress Factors Health changes    ADHCPOA paperwork left with patient. No questions at this time, will discuss with family and call when ready for notary. Ural Acree L. Volanda Napoleon, MDiv

## 2017-04-28 NOTE — Progress Notes (Signed)
PT Cancellation Note  Patient Details Name: Christy [name] MRN: [id number] DOB: [birth year]   Cancelled Treatment:    Reason Eval/Treat Not Completed: Medical issues which prohibited therapy.  Lethargic and not eating yet.  Will try again this afternoon to see how she is doing, may need 2 person assist as she is very weak.   Ramond Dial 04/28/2017, 11:36 AM   Mee Hives, PT MS Acute Rehab Dept. Number: [place] [facility] [place]

## 2017-04-29 ENCOUNTER — Inpatient Hospital Stay (HOSPITAL_COMMUNITY): Payer: Medicare Other

## 2017-04-29 DIAGNOSIS — E876 Hypokalemia: Principal | ICD-10-CM

## 2017-04-29 DIAGNOSIS — E118 Type 2 diabetes mellitus with unspecified complications: Secondary | ICD-10-CM

## 2017-04-29 DIAGNOSIS — N3000 Acute cystitis without hematuria: Secondary | ICD-10-CM

## 2017-04-29 LAB — COMPREHENSIVE METABOLIC PANEL
ALT: 15 U/L (ref 14–54)
ANION GAP: 11 (ref 5–15)
AST: 17 U/L (ref 15–41)
Albumin: 2.3 g/dL — ABNORMAL LOW (ref 3.5–5.0)
Alkaline Phosphatase: 94 U/L (ref 38–126)
BUN: 11 mg/dL (ref 6–20)
CALCIUM: 7.9 mg/dL — AB (ref 8.9–10.3)
CO2: 25 mmol/L (ref 22–32)
Chloride: 102 mmol/L (ref 101–111)
Creatinine, Ser: 0.56 mg/dL (ref 0.44–1.00)
GLUCOSE: 79 mg/dL (ref 65–99)
POTASSIUM: 3.4 mmol/L — AB (ref 3.5–5.1)
SODIUM: 138 mmol/L (ref 135–145)
Total Bilirubin: 0.5 mg/dL (ref 0.3–1.2)
Total Protein: 6.2 g/dL — ABNORMAL LOW (ref 6.5–8.1)

## 2017-04-29 LAB — GLUCOSE, CAPILLARY
GLUCOSE-CAPILLARY: 101 mg/dL — AB (ref 65–99)
GLUCOSE-CAPILLARY: 118 mg/dL — AB (ref 65–99)
Glucose-Capillary: 77 mg/dL (ref 65–99)
Glucose-Capillary: 94 mg/dL (ref 65–99)

## 2017-04-29 LAB — CBC
HCT: 35 % — ABNORMAL LOW (ref 36.0–46.0)
Hemoglobin: 11 g/dL — ABNORMAL LOW (ref 12.0–15.0)
MCH: 29.1 pg (ref 26.0–34.0)
MCHC: 31.4 g/dL (ref 30.0–36.0)
MCV: 92.6 fL (ref 78.0–100.0)
PLATELETS: 231 10*3/uL (ref 150–400)
RBC: 3.78 MIL/uL — ABNORMAL LOW (ref 3.87–5.11)
RDW: 13 % (ref 11.5–15.5)
WBC: 7.5 10*3/uL (ref 4.0–10.5)

## 2017-04-29 MED ORDER — CEPHALEXIN 500 MG PO CAPS
500.0000 mg | ORAL_CAPSULE | Freq: Two times a day (BID) | ORAL | Status: DC
  Administered 2017-04-29 (×2): 500 mg via ORAL
  Filled 2017-04-29 (×3): qty 1

## 2017-04-29 NOTE — Progress Notes (Signed)
Patient ID: Christy [name], female   DOB: Apr 14, 1941, 76 y.o.   MRN: [id number]                                                                PROGRESS NOTE                                                                                                                                                                                                             Patient Demographics:    Christy [name], is a 76 y.o. female, DOB - 13-Feb-1941, KGY:[id number]  Admit date - 04/27/2017   Admitting Physician Waldemar Dickens, MD  Outpatient Primary MD for the patient is [name], MD  LOS - 1  Outpatient Specialists:    Chief Complaint  Patient presents with  . Emesis       Brief Narrative 76 y.o.fem[place]efrom [place] with medical history significant for DM, HTN, HLD, sacral ulcer,recent bilateral forearms fracture, R arm fracture due to osteoporosis and falls, priorly seen at the ED 1 week ago with possible bladder outlet obstruction, d/c with foley, which was changed on 6/25. In the interim, patient's overall status has declined, with FTT, and vomiting about 5 times prior to admission . History was obtained by EDP, chart and is limited due to language barrier and overal clinical status. No food poisoning. Denies fevers, chills, night sweats, vision changes Denies any respiratory complaints. Denies any chest pain or palpitations. Denies lower extremity swelling. She reports abdominal pain and constipation .Denies abnormal skin rashes Denies any bleeding issues such as epistaxis, hematemesis, hematuria or hematochezia.     Subjective:    Christy [name] today is smiling.  Pt denies fever, chills, n/v, abd pain.  RUQ ultrasound negative.  No headache, No chest pain, No diarrhea - No Nausea, No new weakness tingling or numbness, No Cough - SOB.    Assessment  & Plan :    Active Problems:   Breast cancer of upper-outer quadrant of right female breast ([place])   Diabetes ([place])   Osteoporosis  Hyperlipidemia   Essential hypertension   Multiple fractures   Leukocytosis   Dehydration   Anemia   UTI (urinary tract infection)   Constipation   Failure to thrive (0-17)   Hypokalemia   Moderate protein-calorie malnutrition ([place])  Diabetes mellitus with complication ([facility])   Nausea & vomiting   Intractable Nausea and vomiting and abdominal pain . Viral versus bacterial versus constipation vs other etiology. 5 episodes prior to admission, received IV Zofran at the ED with minimal comfortAfebrile, . Mild leukocytosis.11.7 CT abdomen negative for acute abnormalities, and large amount of stool seen suspect DM gastroparesis) . Incidental cholelithiasis  RUQ ultrasound  Bowel rest, advance as tolerated  IV Zofran IV fluids IV antiemetics  Enema x1 for Bowel evacuation and comfort . Careful use of narcotics  Lactic acid x1  Nausea has improved,  No n/v today.    UTI suggested by Moderate leukocytes in urine. History of bladder outlet obstruction, currently on a catheter, last changed on 6.25 WBC is 11.7 . Received IV Rocephin In ED. IVF 1 L  F/u urine culture Ceftriaxone IV => keflex 500mg  po bid  Awaiting final culture  Hypokalemia, likely due to vomiting. EKG pending, admission K was 2.9, received 30 meq K.  KCL 40 meq  Check Mg EKG q 8 h x3  cmp in am  Hypertension BP 149/61  Pulse 78  Controlled Continue home anti-hypertensive medications  Hydralazine prn IV for BP 160/90  Type II Diabetes Current blood sugar level is 221 Recent Labs       Lab Results  Component Value Date   HGBA1C 9.5 01/06/2017    Hold home oral diabetic medications.  SSI Heart healthy carb modified diet.    Failure to thrive. Albumin 2.7  Continue Remeron when able to eat  IV Fluids nutrition consult Check prealbumin  palliative care consult to determine goals of care    Depression Continue home Celexa, Enablex   DVT prophylaxis:Heparin    Code Status:Full  Family Communication:Discussed with patient Disposition Plan:Expect patient to be discharged to nursinghome after condition improves Consults called:Palliative Care  Admission status:Tele Obs  .   Lab Results  Component Value Date   PLT 231 04/29/2017      Anti-infectives    Start     Dose/Rate Route Frequency Ordered Stop   04/29/17 1000  cephALEXin (KEFLEX) capsule 500 mg     500 mg Oral Every 12 hours 04/29/17 0934     04/28/17 1200  cefTRIAXone (ROCEPHIN) 1 g in dextrose 5 % 50 mL IVPB  Status:  Discontinued     1 g 100 mL/hr over 30 Minutes Intravenous Every 24 hours 04/27/17 1720 04/29/17 0934   04/27/17 1245  cefTRIAXone (ROCEPHIN) 1 g in dextrose 5 % 50 mL IVPB     1 g 100 mL/hr over 30 Minutes Intravenous  Once 04/27/17 1236 04/27/17 1507        Objective:   Vitals:   04/28/17 2152 04/29/17 0606 04/29/17 0800 04/29/17 1600  BP: (!) 128/44 (!) 125/58 (!) 163/56 (!) 141/72  Pulse: 70 69 61 69  Resp: 19 18 16 16   Temp: 98.2 F (36.8 C) 97.8 F (36.6 C) 98 F (36.7 C) 98.4 F (36.9 C)  TempSrc: Oral Oral Oral Oral  SpO2: 100% 100% 100% 100%  Weight: 90.3 kg (199 lb)     Height:        Wt Readings from Last 3 Encounters:  04/28/17 90.3 kg (199 lb)  04/15/17 99.8 kg (220 lb)  04/12/17 99.3 kg (219 lb)     Intake/Output Summary (Last 24 hours) at 04/29/17 1816 Last data filed at 04/29/17 1700  Gross per 24 hour  Intake  420 ml  Output             1200 ml  Net             -780 ml     Physical Exam  Awake Alert, Oriented X 3, No new F.N deficits, Normal affect Christy [name].AT,PERRAL Supple Neck,No JVD, No cervical lymphadenopathy appriciated.  Symmetrical Chest wall movement, Good air movement bilaterally, CTAB RRR,No Gallops,Rubs or new Murmurs, No Parasternal Heave +ve B.Sounds, Abd Soft, No tenderness, No organomegaly appriciated, No rebound - guarding or rigidity. No Cyanosis, Clubbing or edema, No new Rash  or bruise   No cva tenderness    Data Review:    CBC  Recent Labs Lab 04/27/17 1146 04/28/17 1021 04/29/17 0513  WBC 11.7* 9.5 7.5  HGB 11.4* 10.7* 11.0*  HCT 36.6 34.6* 35.0*  PLT 230 213 231  MCV 94.6 94.0 92.6  MCH 29.5 29.1 29.1  MCHC 31.1 30.9 31.4  RDW 13.2 13.1 13.0  LYMPHSABS 1.1  --   --   MONOABS 0.9  --   --   EOSABS 0.0  --   --   BASOSABS 0.0  --   --     Chemistries   Recent Labs Lab 04/27/17 1146 04/27/17 1824 04/28/17 1021 04/29/17 0513  NA 140  --  138 138  K 2.9*  --  3.0* 3.4*  CL 97*  --  100* 102  CO2 31  --  29 25  GLUCOSE 221*  --  140* 79  BUN 31*  --  14 11  CREATININE 0.81  --  0.67 0.56  CALCIUM 8.2*  --  7.8* 7.9*  MG  --  2.0  --   --   AST 20  --  14* 17  ALT 18  --  13* 15  ALKPHOS 113  --  97 94  BILITOT 0.6  --  0.4 0.5   ------------------------------------------------------------------------------------------------------------------ No results for input(s): CHOL, HDL, LDLCALC, TRIG, CHOLHDL, LDLDIRECT in the last 72 hours.  Lab Results  Component Value Date   HGBA1C 9.5 01/06/2017   ------------------------------------------------------------------------------------------------------------------ No results for input(s): TSH, T4TOTAL, T3FREE, THYROIDAB in the last 72 hours.  Invalid input(s): FREET3 ------------------------------------------------------------------------------------------------------------------ No results for input(s): VITAMINB12, FOLATE, FERRITIN, TIBC, IRON, RETICCTPCT in the last 72 hours.  Coagulation profile No results for input(s): INR, PROTIME in the last 168 hours.  No results for input(s): DDIMER in the last 72 hours.  Cardiac Enzymes No results for input(s): CKMB, TROPONINI, MYOGLOBIN in the last 168 hours.  Invalid input(s): CK ------------------------------------------------------------------------------------------------------------------    Component Value Date/Time   BNP 14.0  03/28/2017 1645    Inpatient Medications  Scheduled Meds: . amLODipine  10 mg Oral Daily  . aspirin EC  81 mg Oral Daily  . atorvastatin  20 mg Oral QHS  . cephALEXin  500 mg Oral Q12H  . citalopram  20 mg Oral Daily  . darifenacin  7.5 mg Oral Daily  . docusate sodium  100 mg Oral BID  . fenofibrate  54 mg Oral Daily  . furosemide  40 mg Oral Daily  . heparin  5,000 Units Subcutaneous Q8H  . insulin aspart  0-9 Units Subcutaneous TID WC  . insulin detemir  26 Units Subcutaneous QHS  . lactulose  300 mL Rectal Once  . lisinopril  40 mg Oral Daily  . mirtazapine  7.5 mg Oral QHS  . oxyCODONE  5 mg Oral Q6H  . potassium chloride  10 mEq  Oral Daily  . senna  1 tablet Oral Daily  . sodium chloride flush  3 mL Intravenous Q12H   Continuous Infusions: PRN Meds:.acetaminophen **OR** acetaminophen, bisacodyl, hydrALAZINE, ketorolac, ondansetron **OR** ondansetron (ZOFRAN) IV, senna-docusate, sodium chloride flush  Micro Results Recent Results (from the past 240 hour(s))  Urine culture     Status: Abnormal (Preliminary result)   Collection Time: 04/27/17 12:06 PM  Result Value Ref Range Status   Specimen Description URINE, RANDOM  Final   Special Requests NONE  Final   Culture (A)  Final    >=100,000 COLONIES/mL KLEBSIELLA PNEUMONIAE SUSCEPTIBILITIES TO FOLLOW    Report Status PENDING  Incomplete  Culture, blood (Routine X 2) w Reflex to ID Panel     Status: None (Preliminary result)   Collection Time: 04/27/17  6:14 PM  Result Value Ref Range Status   Specimen Description BLOOD LEFT ANTECUBITAL  Final   Special Requests   Final    BOTTLES DRAWN AEROBIC AND ANAEROBIC Blood Culture adequate volume   Culture NO GROWTH 2 DAYS  Final   Report Status PENDING  Incomplete  Culture, blood (Routine X 2) w Reflex to ID Panel     Status: None (Preliminary result)   Collection Time: 04/27/17  6:24 PM  Result Value Ref Range Status   Specimen Description BLOOD LEFT ANTECUBITAL  Final    Special Requests   Final    BOTTLES DRAWN AEROBIC AND ANAEROBIC Blood Culture adequate volume   Culture NO GROWTH 2 DAYS  Final   Report Status PENDING  Incomplete    Radiology Reports Ct Abdomen Pelvis Wo Contrast  Result Date: 04/15/2017 CLINICAL DATA:  Possible [name] air on chest x-ray earlier today. EXAM: CT ABDOMEN AND PELVIS WITHOUT CONTRAST TECHNIQUE: Multidetector CT imaging of the abdomen and pelvis was performed following the standard protocol without IV contrast. COMPARISON:  06/27/2010 FINDINGS: Lower chest: Subsegmental atelectasis at the lung bases. Heart is enlarged with coronary artery calcification evident. Hepatobiliary: No focal abnormality in the liver on this study without intravenous contrast. There is no evidence for gallstones, gallbladder wall thickening, or pericholecystic fluid. Extrahepatic common duct is dilated at 13 mm without etiology evident. No associated intrahepatic biliary duct dilatation. Pancreas: Insert pancreas Spleen: No splenomegaly. No focal mass lesion. Adrenals/Urinary Tract: Stable thickening right adrenal gland with attenuation suggesting adenoma. 2.6 cm left adrenal nodule cannot be characterized as an adenoma today based on attenuation, but was present on the study from 7 years ago and measured 2.3 cm at that time, most compatible with a benign etiology. 2 mm nonobstructing stone identified interpolar right kidney. Left kidney unremarkable. No evidence for hydroureter. Urinary bladder is markedly distended with the dome at the level of the umbilicus. Stomach/Bowel: Stomach is nondistended. No gastric wall thickening. No evidence of outlet obstruction. Duodenum is normally positioned as is the ligament of Treitz. No small bowel wall thickening. No small bowel dilatation. The terminal ileum is normal. The appendix is normal. No gross colonic mass. No colonic wall thickening. No substantial diverticular change. Vascular/Lymphatic: There is abdominal aortic  atherosclerosis without aneurysm. There is no gastrohepatic or hepatoduodenal ligament lymphadenopathy. No intraperitoneal or retroperitoneal lymphadenopathy. No pelvic sidewall lymphadenopathy. Reproductive: The uterus has normal CT imaging appearance. There is no adnexal mass. Other: No intraperitoneal [name] fluid. Musculoskeletal: Right hip replacement generates streak artifact obscuring the inferior right anatomic pelvis. Bone windows reveal no worrisome lytic or sclerotic osseous lesions. Mild compression deformity noted at L1 similar to prior. IMPRESSION: 1. No  intraperitoneal [name] air. Appearance on x-ray earlier today presumably related to basilar atelectasis. 2. Marked bladder distention. Component of bladder outlet obstruction suggested by CT. 3. Mild extrahepatic biliary duct dilatation of indeterminate etiology. Correlation with liver function test may prove helpful. 4. Bilateral adrenal nodules, with right adrenal nodule attenuation compatible with adenoma. Larger left adrenal nodule is similar to the study from 7 years ago suggesting benign etiology such as lipid poor adenoma. 5. Tiny nonobstructing right renal stone. 6.  Aortic Atherosclerois (ICD10-170.0) Electronically Signed   By: Misty Stanley M.D.   On: 04/15/2017 19:48   Dg Chest 2 View  Result Date: 04/15/2017 CLINICAL DATA:  Altered mental status, cough and weakness EXAM: CHEST  2 VIEW COMPARISON:  Portable chest x-ray of 04/03/2017 FINDINGS: The lungs are not well aerated with persistent bibasilar atelectasis and/or scarring. However on the frontal view which is semi-erect, [name] air cannot be excluded under the right hemidiaphragm. Left lateral decubitus of the abdomen is recommended versus CT of the abdomen which is more sensitive to exclude [name] air. Mediastinal and hilar contours are unchanged and cardiomegaly is stable. Probable old right lateral rib fractures are noted. There are degenerative changes diffusely throughout the thoracic  spine. IMPRESSION: 1. Lucency under the right hemidiaphragm. Cannot exclude [name] intraperitoneal air. Recommend either left lateral decubitus of the abdomen or CT of the abdomen pelvis to exclude [name] air. 2. Persistent bibasilar atelectasis or scarring. Critical Value/emergent results were called by telephone at the time of interpretation on 04/15/2017 at 5:12 pm to PA NICOLE NADEAU , who verbally acknowledged these results. Electronically Signed   By: Ivar Drape M.D.   On: 04/15/2017 17:12   Ct Abdomen Pelvis W Contrast  Result Date: 04/27/2017 CLINICAL DATA:  76 year old female with acute abdominal pain and vomiting. EXAM: CT ABDOMEN AND PELVIS WITH CONTRAST TECHNIQUE: Multidetector CT imaging of the abdomen and pelvis was performed using the standard protocol following bolus administration of intravenous contrast. CONTRAST:  75 cc intravenous Omnipaque 300 COMPARISON:  04/15/2017 and prior CTs FINDINGS: Right hip replacement obscures detail within the pelvis. Lower chest: Bibasilar atelectasis/ scarring again noted. Cardiomegaly and heavy coronary artery calcifications again identified. Hepatobiliary: The liver is unremarkable. Probable cholelithiasis versus gallbladder sludge noted. There is no evidence of biliary dilatation. Pancreas: Atrophic without other significant abnormality Spleen: Unremarkable Adrenals/Urinary Tract: Bilateral adrenal masses/adenomas are unchanged from 2010 with the largest measuring 2.6 cm on the left. Renal cortical atrophy, small left renal cysts and a punctate nonobstructing mid right renal calculus noted. A Foley catheter is present within the bladder. There is no evidence of hydronephrosis. Stomach/Bowel: There is no evidence of bowel obstruction or definite bowel wall thickening. A large amount of rectal stool is present. No inflammatory changes are identified. The appendix is normal. Vascular/Lymphatic: Aortic atherosclerosis. No enlarged abdominal or pelvic lymph nodes.  Reproductive: Uterus and bilateral adnexa are unremarkable. Other: No ascites, abscess or pneumoperitoneum. Musculoskeletal: No acute abnormalities. Right total hip replacement, remote L1 compression fracture and degenerative changes within the lumbar spine again noted. IMPRESSION: No evidence of acute abnormality. Large amount of rectal stool. Cholelithiasis versus gallbladder sludge. Cardiomegaly and coronary artery disease. Aortic Atherosclerosis (ICD10-I70.0). Electronically Signed   By: Margarette Canada M.D.   On: 04/27/2017 15:28   Dg Chest Port 1 View  Result Date: 04/03/2017 CLINICAL DATA:  Breast cancer, hypertension, diabetes, hypoxia EXAM: PORTABLE CHEST 1 VIEW COMPARISON:  03/08/2017 FINDINGS: Similar cardiomegaly with chronic vascular congestion and interstitial prominence. Increased bibasilar atelectasis.  No large effusion or pneumothorax. Degenerative changes of the spine. Aorta is atherosclerotic and ectatic. Bones are osteopenic. IMPRESSION: Similar cardiomegaly with chronic vascular congestion and interstitial prominence. Increased bibasilar atelectasis. Electronically Signed   By: Jerilynn Mages.  Shick M.D.   On: 04/03/2017 13:17   Dg Abd 2 Views  Result Date: 04/29/2017 CLINICAL DATA:  Nausea, vomiting EXAM: ABDOMEN - 2 VIEW COMPARISON:  CT 04/27/2017 FINDINGS: Nonobstructive bowel gas pattern. No [name] air, organomegaly or suspicious calcification. Vascular calcifications noted. Degenerative changes in the lumbar spine. Prior right hip replacement. IMPRESSION: No acute findings. Electronically Signed   By: Rolm Baptise M.D.   On: 04/29/2017 11:55   Dg Abd Acute W/chest  Result Date: 04/27/2017 CLINICAL DATA:  Abdominal pain and vomiting EXAM: DG ABDOMEN ACUTE W/ 1V CHEST COMPARISON:  04/15/2017 FINDINGS: Cardiac shadow is again enlarged. Aortic calcifications are again seen. Scarring is noted in the bases bilaterally stable from the prior exam. Scattered large and small bowel gas is noted. No abnormal  mass or abnormal calcifications are noted. Right hip replacement is seen. No [name] air is noted. Degenerative changes of the lumbar spine are seen with compression deformity of L1 of chronic nature. IMPRESSION: Chronic changes without acute abnormality. Electronically Signed   By: Inez Catalina M.D.   On: 04/27/2017 13:09   US Abdomen Limited Ruq  Result Date: 04/28/2017 CLINICAL DATA:  Abdominal pain, follow-up from abnormal CT on the previous day EXAM: ULTRASOUND ABDOMEN LIMITED RIGHT UPPER QUADRANT COMPARISON:  04/27/2017 FINDINGS: Gallbladder: Gallbladder is well distended without evidence of cholelithiasis or pericholecystic fluid. Common bile duct: Diameter: 10 mm. This is somewhat prominent. No definitive calculus is identified. Correlation laboratory values is recommended. Liver: Diffusely heterogeneous and increased in echogenicity consistent with fatty infiltration. IMPRESSION: No evidence of cholelithiasis. Prominent common bile duct without definitive stone. Correlation with laboratory values is recommended. Changes consistent with fatty infiltration of the liver. Electronically Signed   By: Inez Catalina M.D.   On: 04/28/2017 08:55    Time Spent in minutes  30   Jani Gravel M.D on 04/29/2017 at 6:16 PM  Between 7am to 7pm - Pager - [phone]  After 7pm go to [email or web address] - password [facility]  [facility] -  Office  [phone]

## 2017-04-29 NOTE — Progress Notes (Signed)
Orthopedic Tech Progress Note Patient Details:  Christy [name] 1941-06-30 [id number]  Ortho Devices Type of Ortho Device: Ace wrap Ortho Device/Splint Location: (B) UE   Braulio Bosch 04/29/2017, 4:14 PM

## 2017-04-29 NOTE — Evaluation (Addendum)
Physical Therapy Evaluation Patient Details Name: Christy [name] MRN: [id number] DOB: 07-18-1941 Today's Date: 04/29/2017   History of Present Illness  Pt is a 76 y.o. female presenting with FTT and vomiting. pt was diagnosed with a UTI and hypokalemia upon admission. Pt has bilateral forearm fractures and underwent a left patellectomy s/p 4 weeks due to recent fall. PMHx: DM, HTN, HLD, sacral ulcer, recent bil forearm fxs.  Clinical Impression  Pt presents with the above diagnosis and below deficits for therapy evaluation. Prior to admission, pt was receiving rehab at a SNF following fall resulting in bilateral forearm fractures and left patella fracture. Pt would like to return home, but is open to returning to SNF at discharge if necessary. Evaluation was performed through a phone interpreter. Pt requires max A for all mobility this session due to decreased use of UE's and LLE. Pt will benefit from continued acute rehab services in order to address the below deficits prior to discharge to venue recommended below.     Follow Up Recommendations SNF    Equipment Recommendations  None recommended by PT    Recommendations for Other Services       Precautions / Restrictions Precautions Precautions: Fall Required Braces or Orthoses: Other Brace/Splint Other Brace/Splint: bledsoe brace LLE Restrictions Weight Bearing Restrictions: Yes RUE Weight Bearing: Weight bear through elbow only LLE Weight Bearing: Weight bearing as tolerated Other Position/Activity Restrictions: recieved from previous chart review. No updated orders      Mobility  Bed Mobility Overal bed mobility: Needs Assistance Bed Mobility: Supine to Sit     Supine to sit: Max assist;+2 for physical assistance     General bed mobility comments: Max A to EOB x2 to assist with Le's and trunk. Pt is able to initate LE movement to EOB  Transfers Overall transfer level: Needs assistance Equipment used: 2 person hand held  assist Transfers: Sit to/from [employer] Transfers Sit to Stand: Max assist;+2 physical assistance Stand pivot transfers: Max assist;+2 physical assistance       General transfer comment: Max A x2 from bed to recliner with cues for positioning LE's  Ambulation/Gait             General Gait Details: Did not attempt  Stairs            Wheelchair Mobility    Modified Rankin (Stroke Patients Only)       Balance Overall balance assessment: Needs assistance;History of Falls Sitting-balance support: Feet supported;Bilateral upper extremity supported Sitting balance-Leahy Scale: Poor Sitting balance - Comments: Mod A to maintain sitting EOB Postural control: Posterior lean Standing balance support: Bilateral upper extremity supported;During functional activity Standing balance-Leahy Scale: Zero Standing balance comment: reliant on clincians to assist with transfer                             Pertinent Vitals/Pain Pain Assessment: Faces Faces Pain Scale: Hurts a little bit Pain Location: bilateral UE's with mobility Pain Descriptors / Indicators: Grimacing;Guarding Pain Intervention(s): Monitored during session;Repositioned    Home Living Family/patient expects to be discharged to:: Skilled nursing facility                 Additional Comments: pt wants to return home, no family present to confirm     Prior Function Level of Independence: Independent with assistive device(s)         Comments: Unsure, pt unable to provide information and no family present.  Hand Dominance   Dominant Hand: Right    Extremity/Trunk Assessment   Upper Extremity Assessment Upper Extremity Assessment: Defer to OT evaluation    Lower Extremity Assessment Lower Extremity Assessment: LLE deficits/detail LLE Deficits / Details: pt with bledsoe brace in place At least 3/5 grossly       Communication   Communication: Prefers language other than  English  Cognition Arousal/Alertness: Awake/alert Behavior During Therapy: Flat affect Overall Cognitive Status: No family/caregiver present to determine baseline cognitive functioning                                        General Comments General comments (skin integrity, edema, etc.): Interpreter usedDerek Mound [phone]    Exercises     Assessment/Plan    PT Assessment Patient needs continued PT services  PT Problem List Decreased strength;Decreased activity tolerance;Decreased balance;Decreased mobility;Decreased knowledge of use of DME;Pain       PT Treatment Interventions DME instruction;Gait training;Functional mobility training;Therapeutic activities;Therapeutic exercise;Balance training    PT Goals (Current goals can be found in the Care Plan section)  Acute Rehab PT Goals Patient Stated Goal: none stated PT Goal Formulation: Patient unable to participate in goal setting Time For Goal Achievement: 05/06/17 Potential to Achieve Goals: Fair    Frequency Min 2X/week   Barriers to discharge        Co-evaluation               AM-PAC PT "6 Clicks" Daily Activity  Outcome Measure Difficulty turning over in bed (including adjusting bedclothes, sheets and blankets)?: Total Difficulty moving from lying on back to sitting on the side of the bed? : Total Difficulty sitting down on and standing up from a chair with arms (e.g., wheelchair, bedside commode, etc,.)?: Total Help needed moving to and from a bed to chair (including a wheelchair)?: Total Help needed walking in hospital room?: Total Help needed climbing 3-5 steps with a railing? : Total 6 Click Score: 6    End of Session Equipment Utilized During Treatment: Gait belt Activity Tolerance: Patient limited by fatigue Patient left: in chair;with call bell/phone within reach;with chair alarm set Nurse Communication: Mobility status PT Visit Diagnosis: Unsteadiness on feet (R26.81);Muscle weakness  (generalized) (M62.81);Difficulty in walking, not elsewhere classified (R26.2)    Time: 5686-1683 PT Time Calculation (min) (ACUTE ONLY): 21 min   Charges:   PT Evaluation $PT Eval Moderate Complexity: 1 Procedure     PT G Codes:        Scheryl Marten PT, DPT  [phone]   Jacqulyn Liner Sloan Leiter 04/29/2017, 1:30 PM

## 2017-04-30 DIAGNOSIS — I10 Essential (primary) hypertension: Secondary | ICD-10-CM

## 2017-04-30 DIAGNOSIS — B961 Klebsiella pneumoniae [K. pneumoniae] as the cause of diseases classified elsewhere: Secondary | ICD-10-CM

## 2017-04-30 DIAGNOSIS — A498 Other bacterial infections of unspecified site: Secondary | ICD-10-CM | POA: Diagnosis present

## 2017-04-30 LAB — GLUCOSE, CAPILLARY
GLUCOSE-CAPILLARY: 130 mg/dL — AB (ref 65–99)
GLUCOSE-CAPILLARY: 169 mg/dL — AB (ref 65–99)
Glucose-Capillary: 97 mg/dL (ref 65–99)
Glucose-Capillary: 111 mg/dL — ABNORMAL HIGH (ref 65–99)

## 2017-04-30 LAB — URINE CULTURE: Culture: >=100000 — AB

## 2017-04-30 MED ORDER — FLEET ENEMA 7-19 GM/118ML RE ENEM
1.0000 | ENEMA | Freq: Once | RECTAL | Status: AC
  Administered 2017-04-30: 1 via RECTAL
  Filled 2017-04-30: qty 1

## 2017-04-30 MED ORDER — METOCLOPRAMIDE HCL 5 MG PO TABS
5.0000 mg | ORAL_TABLET | Freq: Three times a day (TID) | ORAL | Status: DC
  Administered 2017-04-30: 5 mg via ORAL
  Filled 2017-04-30: qty 1

## 2017-04-30 MED ORDER — POTASSIUM CHLORIDE CRYS ER 20 MEQ PO TBCR
20.0000 meq | EXTENDED_RELEASE_TABLET | Freq: Every day | ORAL | Status: DC
  Administered 2017-04-30: 20 meq via ORAL
  Filled 2017-04-30: qty 1

## 2017-04-30 MED ORDER — METOCLOPRAMIDE HCL 5 MG PO TABS: 5.0000 mg | ORAL_TABLET | Freq: Three times a day (TID) | ORAL | Status: DC

## 2017-04-30 MED ORDER — SODIUM CHLORIDE 0.9 % IV SOLN
1.0000 g | INTRAVENOUS | Status: DC
  Filled 2017-04-30 (×2): qty 1

## 2017-04-30 MED ORDER — METOCLOPRAMIDE HCL 5 MG/ML IJ SOLN
5.0000 mg | Freq: Once | INTRAMUSCULAR | Status: DC
  Filled 2017-04-30: qty 2

## 2017-04-30 MED ORDER — CEPHALEXIN 500 MG PO CAPS: 500.0000 mg | ORAL_CAPSULE | Freq: Two times a day (BID) | ORAL | 0 refills | Status: DC

## 2017-04-30 MED ORDER — POTASSIUM CHLORIDE CRYS ER 20 MEQ PO TBCR: 20.0000 meq | EXTENDED_RELEASE_TABLET | Freq: Every day | ORAL | 0 refills | Status: DC

## 2017-04-30 MED ORDER — BISACODYL 10 MG RE SUPP: 10.0000 mg | Freq: Every day | RECTAL | 0 refills | Status: DC | PRN

## 2017-04-30 NOTE — Discharge Summary (Signed)
Christy [name], is a 76 y.o. female  DOB June 24, 1941  MRN [id number].  Admission date:  04/27/2017  Admitting Physician  Waldemar Dickens, MD  Discharge Date:  04/30/2017   Primary MD  [name], MD  Recommendations for primary care physician for things to follow:   Intractable Nausea and vomiting and abdominal pain . Viral versus bacterial versus constipation vs uti.  Cont zofran  UTI secondary to Klebsiella Sensitivity pending Cont keflex 500mg  po bid x 5 days  Hypokalemia, likely due to vomiting.  repleted Started on Kcl 12meq po qday cmp in 1 week  HypertensionControlled Continue home anti-hypertensive medications   Type II Diabetes  Resume metformin, but if this causes n/v then please d/c Cont levemir    Failure to thrive. Albumin 2.7  Continue Remeron when able to eat  medpass  Depression Continue home Celexa, Enablex   Admission Diagnosis  Dehydration [E86.0] Hypokalemia [E87.6]   Discharge Diagnosis  Dehydration [E86.0] Hypokalemia [E87.6]    Active Problems:   Breast cancer of upper-outer quadrant of right female breast (HCC)   Diabetes ([place])   Osteoporosis   Hyperlipidemia   Essential hypertension   Multiple fractures   Leukocytosis   Dehydration   Anemia   UTI (urinary tract infection)   Constipation   Failure to thrive (0-17)   Hypokalemia   Moderate protein-calorie malnutrition (HCC)   Diabetes mellitus with complication (HCC)   Nausea & vomiting      Past Medical History:  Diagnosis Date  . Abnormal ECG    Inferior Q waves  . Cancer ([place])    stage II right breast cancer  . Cataract   . Diabetes mellitus   . Hypercholesterolemia   . Hyperlipidemia   . Hypertension   . Osteoporosis   . Overactive bladder     Past Surgical History:  Procedure Laterality Date  . BREAST SURGERY     right lumpectomy, sentinel node biopsy  .  CATARACT EXTRACTION Bilateral 2008  . FRACTURE SURGERY     ORIF right distal radius fx  . JOINT REPLACEMENT     right hip  . ORIF RADIAL FRACTURE Bilateral 03/29/2017   Procedure: OPEN REDUCTION INTERNAL FIXATION (ORIF) RADIAL FRACTURES;  Surgeon: Iran Planas, MD;  Location: [place] [facility];  Service: Orthopedics;  Laterality: Bilateral;  . PATELLECTOMY Left 03/31/2017   Procedure: LEFT PARTIAL PATELLECTOMY;  Surgeon: Leandrew Koyanagi, MD;  Location: [place];  Service: Orthopedics;  Laterality: Left;  . PORT-A-CATH REMOVAL  09/11/2011   Procedure: REMOVAL PORT-A-CATH;  Surgeon: Rolm Bookbinder, MD;  Location: [place];  Service: General;  Laterality: Left;  local   . PORTACATH PLACEMENT         HPI  from the history and physical done on the day of admission:      77 y.o. female from [place] with medical history significant for DM, HTN, HLD, sacral ulcer, recent bilateral forearms fracture, R arm fracture due to osteoporosis and falls, priorly seen at the ED 1 week ago  with possible bladder outlet obstruction, d/c with foley, which was changed on 6/25. In the interim, patient's overall status has declined, with FTT, and vomiting about 5 times prior to admission . History was obtained by EDP, chart and is limited due to  language barrier and overal clinical status. No food poisoning. Denies fevers, chills, night sweats, vision changes  Denies any respiratory complaints. Denies any chest pain or palpitations. Denies lower extremity swelling. She reports abdominal pain and constipation . Denies abnormal skin rashes  Denies any bleeding issues such as epistaxis, hematemesis, hematuria or hematochezia.    ED Course:  BP (!) 149/61   Pulse 78   Temp 98.5 F (36.9 C) (Oral)   Resp 16   Ht 5\' 7"  (1.702 m)   Wt 99.8 kg (220 lb)   SpO2 94%   BMI 34.46 kg/m    Rocephin IV IV bolus 2 L  IV Zofran given  WBC 11.7 GLu 221    Hospital Course:      Pt was admitted for n/v, and UTI.  N/v  resolved,   Pt denies abdomina pain, and has been afebrile. CT abd/pelvis 04/27/2017=>no acute abnormality, large amount of stool, cholelithiasis vs gall bladder sludge RUQ ultrasound 6/27=> no cholelithiasis, prominent common bile duct, fatty liver   Her urine culture grew Klebsiella pneumonia sensitivites pending.  Pt was treated w rocephin 6/26=> 6/28 and then converted to keflex 500mg  po bid.  Pt has tolerated this.  Blood culture 6/26 => ngtd.   I spoke with her daughter yesterday and she agreed that patient was doing well and better than her baseline. Pt has been afebrile and will be discharged to home.   Follow UP  Follow-up Information    [name], [name], MD Follow up in 1 week(s).   Specialty:  Family Medicine Contact information: [place] [address] [phone]            Consults obtained - none  Discharge Condition: stable  Diet and Activity recommendation: See Discharge Instructions below  Discharge Instructions        Discharge Medications     Allergies as of 04/30/2017   No Known Allergies     Medication List    STOP taking these medications   alendronate 70 MG tablet Commonly known as:  FOSAMAX   Fish Oil 1200 MG Caps   ibuprofen 800 MG tablet Commonly known as:  ADVIL,MOTRIN   insulin aspart 100 UNIT/ML injection Commonly known as:  novoLOG   oxyCODONE 5 MG immediate release tablet Commonly known as:  Oxy IR/ROXICODONE     TAKE these medications   acetaminophen 325 MG tablet Commonly known as:  TYLENOL Take 2 tablets (650 mg total) by mouth every 6 (six) hours as needed for mild pain.   amLODipine 10 MG tablet Commonly known as:  NORVASC Take 1 tablet (10 mg total) by mouth daily.   aspirin EC 81 MG tablet Take 81 mg by mouth daily.   atorvastatin 20 MG tablet Commonly known as:  LIPITOR Take 20 mg by mouth at bedtime.   bisacodyl 10 MG suppository Commonly known as:  DULCOLAX Place 1 suppository (10 mg  total) rectally daily as needed for moderate constipation.   cephALEXin 500 MG capsule Commonly known as:  KEFLEX Take 1 capsule (500 mg total) by mouth 2 (two) times daily.   citalopram 20 MG tablet Commonly known as:  CELEXA Take 1 tablet (20 mg total) by mouth daily.   docusate  sodium 100 MG capsule Commonly known as:  COLACE Take 100 mg by mouth 2 (two) times daily. Hold for loose stool   fenofibrate 48 MG tablet Commonly known as:  TRICOR Take 1 tablet (48 mg total) by mouth daily.   furosemide 40 MG tablet Commonly known as:  LASIX Take 1 tablet (40 mg total) by mouth daily.   glimepiride 2 MG tablet Commonly known as:  AMARYL Take 1 tablet (2 mg total) by mouth daily before breakfast.   HYDROcodone-acetaminophen 5-325 MG tablet Commonly known as:  NORCO/VICODIN Take 1 tablet by mouth every 4 (four) hours as needed for moderate pain.   insulin detemir 100 UNIT/ML injection Commonly known as:  LEVEMIR Inject 0.26 mLs (26 Units total) into the skin at bedtime.   lisinopril 40 MG tablet Commonly known as:  PRINIVIL,ZESTRIL Take 1 tablet (40 mg total) by mouth daily.   metFORMIN 500 MG 24 hr tablet Commonly known as:  GLUCOPHAGE-XR Take 2 tablets (1,000 mg total) by mouth 2 (two) times daily. What changed:  how much to take   mirtazapine 7.5 MG tablet Commonly known as:  REMERON Take 7.5 mg by mouth at bedtime. FOR APPETITE   ondansetron 4 MG tablet Commonly known as:  ZOFRAN Take 4 mg by mouth every 6 (six) hours.   potassium chloride SA 20 MEQ tablet Commonly known as:  K-DUR,KLOR-CON Take 1 tablet (20 mEq total) by mouth daily. What changed:  medication strength  how much to take   senna 8.6 MG Tabs tablet Commonly known as:  SENOKOT Take 1 tablet (8.6 mg total) by mouth daily.   solifenacin 10 MG tablet Commonly known as:  VESICARE Take 1 tablet (10 mg total) by mouth daily.   UNABLE TO FIND House 2.0/Mes pass: Drink 90 ml's by mouth three  times a day for poor meal intake   Vitamin D 2000 units tablet Take 2,000 Units by mouth daily.       Major procedures and Radiology Reports - PLEASE review detailed and final reports for all details, in brief -      Ct Abdomen Pelvis Wo Contrast  Result Date: 04/15/2017 CLINICAL DATA:  Possible free air on chest x-ray earlier today. EXAM: CT ABDOMEN AND PELVIS WITHOUT CONTRAST TECHNIQUE: Multidetector CT imaging of the abdomen and pelvis was performed following the standard protocol without IV contrast. COMPARISON:  06/27/2010 FINDINGS: Lower chest: Subsegmental atelectasis at the lung bases. Heart is enlarged with coronary artery calcification evident. Hepatobiliary: No focal abnormality in the liver on this study without intravenous contrast. There is no evidence for gallstones, gallbladder wall thickening, or pericholecystic fluid. Extrahepatic common duct is dilated at 13 mm without etiology evident. No associated intrahepatic biliary duct dilatation. Pancreas: Insert pancreas Spleen: No splenomegaly. No focal mass lesion. Adrenals/Urinary Tract: Stable thickening right adrenal gland with attenuation suggesting adenoma. 2.6 cm left adrenal nodule cannot be characterized as an adenoma today based on attenuation, but was present on the study from 7 years ago and measured 2.3 cm at that time, most compatible with a benign etiology. 2 mm nonobstructing stone identified interpolar right kidney. Left kidney unremarkable. No evidence for hydroureter. Urinary bladder is markedly distended with the dome at the level of the umbilicus. Stomach/Bowel: Stomach is nondistended. No gastric wall thickening. No evidence of outlet obstruction. Duodenum is normally positioned as is the ligament of Treitz. No small bowel wall thickening. No small bowel dilatation. The terminal ileum is normal. The appendix is normal. No gross colonic  mass. No colonic wall thickening. No substantial diverticular change.  Vascular/Lymphatic: There is abdominal aortic atherosclerosis without aneurysm. There is no gastrohepatic or hepatoduodenal ligament lymphadenopathy. No intraperitoneal or retroperitoneal lymphadenopathy. No pelvic sidewall lymphadenopathy. Reproductive: The uterus has normal CT imaging appearance. There is no adnexal mass. Other: No intraperitoneal free fluid. Musculoskeletal: Right hip replacement generates streak artifact obscuring the inferior right anatomic pelvis. Bone windows reveal no worrisome lytic or sclerotic osseous lesions. Mild compression deformity noted at L1 similar to prior. IMPRESSION: 1. No intraperitoneal free air. Appearance on x-ray earlier today presumably related to basilar atelectasis. 2. Marked bladder distention. Component of bladder outlet obstruction suggested by CT. 3. Mild extrahepatic biliary duct dilatation of indeterminate etiology. Correlation with liver function test may prove helpful. 4. Bilateral adrenal nodules, with right adrenal nodule attenuation compatible with adenoma. Larger left adrenal nodule is similar to the study from 7 years ago suggesting benign etiology such as lipid poor adenoma. 5. Tiny nonobstructing right renal stone. 6.  Aortic Atherosclerois (ICD10-170.0) Electronically Signed   By: Misty Stanley M.D.   On: 04/15/2017 19:48   Dg Chest 2 View  Result Date: 04/15/2017 CLINICAL DATA:  Altered mental status, cough and weakness EXAM: CHEST  2 VIEW COMPARISON:  Portable chest x-ray of 04/03/2017 FINDINGS: The lungs are not well aerated with persistent bibasilar atelectasis and/or scarring. However on the frontal view which is semi-erect, free air cannot be excluded under the right hemidiaphragm. Left lateral decubitus of the abdomen is recommended versus CT of the abdomen which is more sensitive to exclude free air. Mediastinal and hilar contours are unchanged and cardiomegaly is stable. Probable old right lateral rib fractures are noted. There are  degenerative changes diffusely throughout the thoracic spine. IMPRESSION: 1. Lucency under the right hemidiaphragm. Cannot exclude free intraperitoneal air. Recommend either left lateral decubitus of the abdomen or CT of the abdomen pelvis to exclude free air. 2. Persistent bibasilar atelectasis or scarring. Critical Value/emergent results were called by telephone at the time of interpretation on 04/15/2017 at 5:12 pm to PA NICOLE NADEAU , who verbally acknowledged these results. Electronically Signed   By: Ivar Drape M.D.   On: 04/15/2017 17:12   Ct Abdomen Pelvis W Contrast  Result Date: 04/27/2017 CLINICAL DATA:  76 year old female with acute abdominal pain and vomiting. EXAM: CT ABDOMEN AND PELVIS WITH CONTRAST TECHNIQUE: Multidetector CT imaging of the abdomen and pelvis was performed using the standard protocol following bolus administration of intravenous contrast. CONTRAST:  75 cc intravenous Omnipaque 300 COMPARISON:  04/15/2017 and prior CTs FINDINGS: Right hip replacement obscures detail within the pelvis. Lower chest: Bibasilar atelectasis/ scarring again noted. Cardiomegaly and heavy coronary artery calcifications again identified. Hepatobiliary: The liver is unremarkable. Probable cholelithiasis versus gallbladder sludge noted. There is no evidence of biliary dilatation. Pancreas: Atrophic without other significant abnormality Spleen: Unremarkable Adrenals/Urinary Tract: Bilateral adrenal masses/adenomas are unchanged from 2010 with the largest measuring 2.6 cm on the left. Renal cortical atrophy, small left renal cysts and a punctate nonobstructing mid right renal calculus noted. A Foley catheter is present within the bladder. There is no evidence of hydronephrosis. Stomach/Bowel: There is no evidence of bowel obstruction or definite bowel wall thickening. A large amount of rectal stool is present. No inflammatory changes are identified. The appendix is normal. Vascular/Lymphatic: Aortic  atherosclerosis. No enlarged abdominal or pelvic lymph nodes. Reproductive: Uterus and bilateral adnexa are unremarkable. Other: No ascites, abscess or pneumoperitoneum. Musculoskeletal: No acute abnormalities. Right total hip replacement, remote L1  compression fracture and degenerative changes within the lumbar spine again noted. IMPRESSION: No evidence of acute abnormality. Large amount of rectal stool. Cholelithiasis versus gallbladder sludge. Cardiomegaly and coronary artery disease. Aortic Atherosclerosis (ICD10-I70.0). Electronically Signed   By: Margarette Canada M.D.   On: 04/27/2017 15:28   Dg Chest Port 1 View  Result Date: 04/03/2017 CLINICAL DATA:  Breast cancer, hypertension, diabetes, hypoxia EXAM: PORTABLE CHEST 1 VIEW COMPARISON:  03/08/2017 FINDINGS: Similar cardiomegaly with chronic vascular congestion and interstitial prominence. Increased bibasilar atelectasis. No large effusion or pneumothorax. Degenerative changes of the spine. Aorta is atherosclerotic and ectatic. Bones are osteopenic. IMPRESSION: Similar cardiomegaly with chronic vascular congestion and interstitial prominence. Increased bibasilar atelectasis. Electronically Signed   By: Jerilynn Mages.  Shick M.D.   On: 04/03/2017 13:17   Dg Abd 2 Views  Result Date: 04/29/2017 CLINICAL DATA:  Nausea, vomiting EXAM: ABDOMEN - 2 VIEW COMPARISON:  CT 04/27/2017 FINDINGS: Nonobstructive bowel gas pattern. No free air, organomegaly or suspicious calcification. Vascular calcifications noted. Degenerative changes in the lumbar spine. Prior right hip replacement. IMPRESSION: No acute findings. Electronically Signed   By: Rolm Baptise M.D.   On: 04/29/2017 11:55   Dg Abd Acute W/chest  Result Date: 04/27/2017 CLINICAL DATA:  Abdominal pain and vomiting EXAM: DG ABDOMEN ACUTE W/ 1V CHEST COMPARISON:  04/15/2017 FINDINGS: Cardiac shadow is again enlarged. Aortic calcifications are again seen. Scarring is noted in the bases bilaterally stable from the prior  exam. Scattered large and small bowel gas is noted. No abnormal mass or abnormal calcifications are noted. Right hip replacement is seen. No free air is noted. Degenerative changes of the lumbar spine are seen with compression deformity of L1 of chronic nature. IMPRESSION: Chronic changes without acute abnormality. Electronically Signed   By: Inez Catalina M.D.   On: 04/27/2017 13:09   US Abdomen Limited Ruq  Result Date: 04/28/2017 CLINICAL DATA:  Abdominal pain, follow-up from abnormal CT on the previous day EXAM: ULTRASOUND ABDOMEN LIMITED RIGHT UPPER QUADRANT COMPARISON:  04/27/2017 FINDINGS: Gallbladder: Gallbladder is well distended without evidence of cholelithiasis or pericholecystic fluid. Common bile duct: Diameter: 10 mm. This is somewhat prominent. No definitive calculus is identified. Correlation laboratory values is recommended. Liver: Diffusely heterogeneous and increased in echogenicity consistent with fatty infiltration. IMPRESSION: No evidence of cholelithiasis. Prominent common bile duct without definitive stone. Correlation with laboratory values is recommended. Changes consistent with fatty infiltration of the liver. Electronically Signed   By: Inez Catalina M.D.   On: 04/28/2017 08:55    Micro Results     Recent Results (from the past 240 hour(s))  Urine culture     Status: Abnormal (Preliminary result)   Collection Time: 04/27/17 12:06 PM  Result Value Ref Range Status   Specimen Description URINE, RANDOM  Final   Special Requests NONE  Final   Culture (A)  Final    >=100,000 COLONIES/mL KLEBSIELLA PNEUMONIAE SUSCEPTIBILITIES TO FOLLOW    Report Status PENDING  Incomplete  Culture, blood (Routine X 2) w Reflex to ID Panel     Status: None (Preliminary result)   Collection Time: 04/27/17  6:14 PM  Result Value Ref Range Status   Specimen Description BLOOD LEFT ANTECUBITAL  Final   Special Requests   Final    BOTTLES DRAWN AEROBIC AND ANAEROBIC Blood Culture adequate  volume   Culture NO GROWTH 2 DAYS  Final   Report Status PENDING  Incomplete  Culture, blood (Routine X 2) w Reflex to ID Panel  Status: None (Preliminary result)   Collection Time: 04/27/17  6:24 PM  Result Value Ref Range Status   Specimen Description BLOOD LEFT ANTECUBITAL  Final   Special Requests   Final    BOTTLES DRAWN AEROBIC AND ANAEROBIC Blood Culture adequate volume   Culture NO GROWTH 2 DAYS  Final   Report Status PENDING  Incomplete       Today   Subjective    Jacqulyne Kretsch today is doing well.  No n/v, no abdominal pain, no diarrhea.   no headache,no chest ,no new weakness tingling or numbness, feels much better wants to go home today.    Objective   Blood pressure (!) 129/55, pulse 72, temperature 98.5 F (36.9 C), temperature source Oral, resp. rate 16, height 5\' 7"  (1.702 m), weight 96.6 kg (213 lb), SpO2 100 %.   Intake/Output Summary (Last 24 hours) at 04/30/17 0823 Last data filed at 04/30/17 0610  Gross per 24 hour  Intake              420 ml  Output              600 ml  Net             -180 ml    Exam Awake Alert, Oriented x 3, No new F.N deficits, Normal affect [place].AT,PERRAL Supple Neck,No JVD, No cervical lymphadenopathy appriciated.  Symmetrical Chest wall movement, Good air movement bilaterally, CTAB RRR,No Gallops,Rubs or new Murmurs, No Parasternal Heave +ve B.Sounds, Abd Soft, Non tender, No organomegaly appriciated, No rebound -guarding or rigidity. No Cyanosis, Clubbing or edema, No new Rash or bruise   Data Review   CBC w Diff:  Lab Results  Component Value Date   WBC 7.5 04/29/2017   HGB 11.0 (L) 04/29/2017   HGB 12.5 08/03/2016   HCT 35.0 (L) 04/29/2017   HCT 37.2 08/03/2016   PLT 231 04/29/2017   PLT 205 08/03/2016   LYMPHOPCT 10 04/27/2017   LYMPHOPCT 24.4 08/03/2016   MONOPCT 8 04/27/2017   MONOPCT 7.7 08/03/2016   EOSPCT 0 04/27/2017   EOSPCT 0.8 08/03/2016   BASOPCT 0 04/27/2017   BASOPCT 0.1 08/03/2016     CMP:  Lab Results  Component Value Date   NA 138 04/29/2017   NA 139 08/03/2016   K 3.4 (L) 04/29/2017   K 4.7 08/03/2016   CL 102 04/29/2017   CL 103 03/21/2013   CO2 25 04/29/2017   CO2 23 08/03/2016   BUN 11 04/29/2017   BUN 29.5 (H) 08/03/2016   CREATININE 0.56 04/29/2017   CREATININE 1.07 (H) 01/06/2017   CREATININE 1.2 (H) 08/03/2016   PROT 6.2 (L) 04/29/2017   PROT 7.3 08/03/2016   ALBUMIN 2.3 (L) 04/29/2017   ALBUMIN 3.4 (L) 08/03/2016   BILITOT 0.5 04/29/2017   BILITOT 0.30 08/03/2016   ALKPHOS 94 04/29/2017   ALKPHOS 71 08/03/2016   AST 17 04/29/2017   AST 20 08/03/2016   ALT 15 04/29/2017   ALT 30 08/03/2016  .   Total Time in preparing paper work, data evaluation and todays exam - 42 minutes  Jani Gravel M.D on 04/30/2017 at 8:23 AM  [facility]   Office  [phone]

## 2017-04-30 NOTE — NC FL2 (Signed)
[place] LEVEL OF CARE SCREENING TOOL     IDENTIFICATION  Patient Name: Christy [name] Birthdate: [birth year] Sex: female Admission Date (Current Location): 04/27/2017  [place] and [place] Number:  Christy [name] [id number] [place] and Address:  [facility] [place], [place] [address], [place], [place] [address]      Provider Number: [id number]  Attending Physician Name and Address:  Jani Gravel, MD  Relative Name and Phone Number:  Margorie John - daughter; [phone]    Current Level of Care: Hospital Recommended Level of Care: [place] (Patient from [facility]) Prior Approval Number:    Date Approved/Denied:   [place] Number: [id number]  Discharge Plan: SNF    Current Diagnoses: Patient Active Problem List   Diagnosis Date Noted  . Nausea & vomiting   . UTI (urinary tract infection) 04/27/2017  . Constipation 04/27/2017  . Failure to thrive (0-17) 04/27/2017  . Hypokalemia 04/27/2017  . Moderate protein-calorie malnutrition ([place])   . Diabetes mellitus with complication ([place])   . OAB (overactive bladder) 04/22/2017  . S/P ORIF (open reduction internal fixation) fracture 04/22/2017  . Closed fracture of facial bone with routine healing 04/22/2017  . Anemia 04/22/2017  . Dehydration   . Displaced comminuted fracture of left patella, initial encounter for closed fracture 03/31/2017  . Elevated troponin   . Acute renal failure (ARF) ([place]) 03/29/2017  . Leukocytosis 03/29/2017  . Fall 03/29/2017  . Multiple fractures 03/28/2017  . Hyperlipidemia 12/27/2016  . Essential hypertension 12/27/2016  . Elevated serum creatinine 08/07/2014  . Elevated BUN 08/07/2014  . Diabetes ([place]) 08/07/2014  . Osteoporosis 08/07/2014  . Breast cancer of upper-outer quadrant of right female breast ([place]) 08/07/2013  . Abnormal EKG 08/21/2011    Orientation RESPIRATION BLADDER Height & Weight     Self  Normal Continent, Indwelling  catheter (Catheter for bladder outlet obstruction. Catheter was present at admission) Weight: 213 lb (96.6 kg) Height:  5\' 7"  (170.2 cm)  BEHAVIORAL SYMPTOMS/MOOD NEUROLOGICAL BOWEL NUTRITION STATUS      Continent Diet (Clear liquid diet)  AMBULATORY STATUS COMMUNICATION OF NEEDS Skin   Total Care (Patient was unable to ambulate with PT during eval on 6/28) Verbally Normal                       Personal Care Assistance Level of Assistance  Bathing, Feeding, Dressing Bathing Assistance: Maximum assistance Feeding assistance: Limited assistance Dressing Assistance: Maximum assistance     Functional Limitations Info  Sight, Hearing, Speech Sight Info: Adequate Hearing Info: Adequate Speech Info: Adequate (Patient does not speak English)    [place]  PT (By licensed PT), OT (By licensed OT)     PT Frequency: Evaluated 6/28 and a minimum of 2X per week therapy recommended OT Frequency: Evaluated 6/27 and a nimimum of 2X per week therapy recommended            Contractures Contractures Info: Not present    Additional Factors Info  Code Status, Allergies, Insulin Sliding Scale Code Status Info: Full Allergies Info: No known allergies   Insulin Sliding Scale Info: 0-9 Units 3X per day with meals       Current Medications (04/30/2017):  This is the current hospital active medication list Current Facility-Administered Medications  Medication Dose Route Frequency Provider Last Rate Last Dose  . acetaminophen (TYLENOL) tablet 650 mg  650 mg Oral Q6H PRN Rondel Jumbo, PA-C   650 mg at  04/29/17 2300   Or  . acetaminophen (TYLENOL) suppository 650 mg  650 mg Rectal Q6H PRN Rondel Jumbo, PA-C      . amLODipine (NORVASC) tablet 10 mg  10 mg Oral Daily Rondel Jumbo, PA-C   10 mg at 04/29/17 1237  . aspirin EC tablet 81 mg  81 mg Oral Daily Rondel Jumbo, PA-C   81 mg at 04/29/17 1240  . atorvastatin (LIPITOR) tablet 20 mg  20 mg Oral QHS Rondel Jumbo, PA-C   20 mg at 04/29/17 2300  . bisacodyl (DULCOLAX) suppository 10 mg  10 mg Rectal Daily PRN Rondel Jumbo, PA-C      . cephALEXin (KEFLEX) capsule 500 mg  500 mg Oral Q12H Jani Gravel, MD   500 mg at 04/29/17 2300  . citalopram (CELEXA) tablet 20 mg  20 mg Oral Daily Rondel Jumbo, PA-C   20 mg at 04/29/17 1240  . darifenacin (ENABLEX) 24 hr tablet 7.5 mg  7.5 mg Oral Daily Rondel Jumbo, PA-C   7.5 mg at 04/29/17 1237  . docusate sodium (COLACE) capsule 100 mg  100 mg Oral BID Rondel Jumbo, PA-C   100 mg at 04/30/17 0955  . fenofibrate tablet 54 mg  54 mg Oral Daily Rondel Jumbo, PA-C   54 mg at 04/29/17 1240  . furosemide (LASIX) tablet 40 mg  40 mg Oral Daily Rondel Jumbo, PA-C   40 mg at 04/29/17 1237  . heparin injection 5,000 Units  5,000 Units Subcutaneous Q8H Rondel Jumbo, PA-C   5,000 Units at 04/29/17 2300  . hydrALAZINE (APRESOLINE) injection 5-10 mg  5-10 mg Intravenous Q8H PRN Sharene Butters E, PA-C      . insulin aspart (novoLOG) injection 0-9 Units  0-9 Units Subcutaneous TID WC Rondel Jumbo, PA-C   1 Units at 04/28/17 1825  . insulin detemir (LEVEMIR) injection 26 Units  26 Units Subcutaneous QHS Rondel Jumbo, PA-C   26 Units at 04/29/17 2300  . ketorolac (TORADOL) 15 MG/ML injection 15 mg  15 mg Intravenous Q8H PRN Rondel Jumbo, PA-C      . lactulose (CHRONULAC) enema 200 gm  300 mL Rectal Once Wertman, Sara E, PA-C      . lisinopril (PRINIVIL,ZESTRIL) tablet 40 mg  40 mg Oral Daily Rondel Jumbo, PA-C   40 mg at 04/29/17 1237  . mirtazapine (REMERON) tablet 7.5 mg  7.5 mg Oral QHS Sharene Butters E, PA-C   7.5 mg at 04/29/17 2300  . ondansetron (ZOFRAN) tablet 4 mg  4 mg Oral Q6H PRN Rondel Jumbo, PA-C       Or  . ondansetron [facility]) injection 4 mg  4 mg Intravenous Q6H PRN Sharene Butters E, PA-C   4 mg at 04/29/17 0031  . oxyCODONE (Oxy IR/ROXICODONE) immediate release tablet 5 mg  5 mg Oral Q6H Waldemar Dickens, MD   5 mg at 04/29/17 2315   . potassium chloride SA (K-DUR,KLOR-CON) CR tablet 20 mEq  20 mEq Oral Daily Jani Gravel, MD   20 mEq at 04/30/17 0956  . senna (SENOKOT) tablet 8.6 mg  1 tablet Oral Daily Rondel Jumbo, PA-C   8.6 mg at 04/30/17 0956  . senna-docusate (Senokot-S) tablet 1 tablet  1 tablet Oral QHS PRN Sharene Butters E, PA-C      . sodium chloride flush (NS) 0.9 % injection 10-40 mL  10-40 mL Intracatheter PRN Jani Gravel, MD      .  sodium chloride flush (NS) 0.9 % injection 3 mL  3 mL Intravenous Q12H Rondel Jumbo, PA-C   3 mL at 04/28/17 2300  . sodium phosphate (FLEET) 7-19 GM/118ML enema 1 enema  1 enema Rectal Once Jani Gravel, MD         Discharge Medications: Please see discharge summary for a list of discharge medications.  Relevant Imaging Results:  Relevant Lab Results:   Additional Information [id number]  Sable Feil, LCSW

## 2017-04-30 NOTE — Progress Notes (Signed)
Pharmacy Antibiotic Note  Christy [name] is a 76 y.o. female admitted on 04/27/2017 with intractable nausea, vomiting,  abdominal pain and FTT. Patient has been on  IV ceftriaxone switched to oral Keflex for UTI.  Urine cultue Klebsiella sensitivitis back today reveal ESBL, resistant to all except Imipenem.  Pharmacy has been consulted for IV Ertapenem (Invanz) dosing for ESBL Klebsiella pneumonia in urine culture.  Plan: Ertapenem 1g IV q24h   Height: 5\' 7"  (170.2 cm) Weight: 213 lb (96.6 kg) IBW/kg (Calculated) : 61.6  Temp (24hrs), Avg:98.6 F (37 C), Min:98.4 F (36.9 C), Max:98.8 F (37.1 C)   Recent Labs Lab 04/27/17 1146 04/27/17 1824 04/28/17 1021 04/29/17 0513  WBC 11.7*  --  9.5 7.5  CREATININE 0.81  --  0.67 0.56  LATICACIDVEN  --  1.1  --   --     Estimated Creatinine Clearance: 71.4 mL/min (by C-G formula based on SCr of 0.56 mg/dL).    No Known Allergies  Antimicrobials this admission: Ceftriaxone 6/26>6/27 Keflex po 6/28 >> 6/29 Ertapenem 6/29 >>   Dose adjustments this admission:   Microbiology results:   6/26 [place] x2 ngtd  6/26 UCx : klebsiella pneumoniae, >100,000 col/ml , ESBL   Thank you for allowing pharmacy to be a part of this patient's care. Nicole Cella, RPh Clinical Pharmacist Pager: [phone] 04/30/2017 2:30 PM

## 2017-04-30 NOTE — Clinical Social Work Note (Signed)
Clinical Social Work Assessment  Patient Details  Name: Christy [name] MRN: [id number] Date of Birth: 17-Mar-1941  Date of referral:  04/27/17               Reason for consult:  Facility Placement                Permission sought to share information with:  Family Supports Permission granted to share information::  No (Patient oriented to self only)  Name::     Margorie John  Agency::     Relationship::  Daughter  Contact Information:  [phone]  Housing/Transportation Living arrangements for the past 2 months:  [place] of Information:  Adult Children, Facility (Talked by phone with daughter Silver [place]) Patient Interpreter Needed:  [place], Other (Comment Required) Company secretary) Criminal Activity/Legal Involvement Pertinent to Current Situation/Hospitalization:  No - Comment as needed Significant Relationships:  Adult Children Lives with:  Adult Children (Patient lives with son Radenko Jeziorski) Do you feel safe going back to the place where you live?  No (Daughter in agreement with patient returning to SNF to continue rehab) Need for family participation in patient care:  Yes (Comment)  Care giving concerns:  Daughter expressed no concerns regarding patient's care at nursing facility.  Social Worker assessment / plan:  CSW talked by phone with daughter and confirmed patient's return to [facility] skilled nursing facility. Per daughter, patient had been at [facility] 3 weeks before coming to hospital and will return there and continue her rehab.  Employment status:  Retired Forensic scientist:  Information systems manager, Medicaid [place] PT Recommendations:  [place] / Referral to community resources:  [place] (None needed or requested as patient from a facility - [facility])  Patient/Family's Response to care:  No concerns expressed by daughter regarding care during hospitalization.  Patient/Family's Understanding of and  Emotional Response to Diagnosis, Current Treatment, and Prognosis:  Not discussed.  Emotional Assessment Appearance:  Appears stated age Attitude/Demeanor/Rapport:  Unable to Assess Affect (typically observed):  Unable to Assess Orientation:  Oriented to Self Alcohol / Substance use:  Tobacco Use, Alcohol Use, Illicit Drugs (Patient reported that she quit smoking 12/27/12 and does not drink or use illicit drugs) Psych involvement (Current and /or in the community):  No (Comment)  Discharge Needs  Concerns to be addressed:  Discharge Planning Concerns Readmission within the last 30 days:  Yes Current discharge risk:  None Barriers to Discharge:  No Barriers Identified   Sable Feil, LCSW 04/30/2017, 1:07 PM

## 2017-04-30 NOTE — Progress Notes (Signed)
Dr. Maudie Mercury T/O [place] PICC placement right arm.

## 2017-04-30 NOTE — Progress Notes (Signed)
During med administration patient began vomiting. MD paged, discharged canceled.

## 2017-04-30 NOTE — Progress Notes (Signed)
Attempted to place PICC into Right arm.  With placing of sterile drape Pt. Became agitated and uncooperative.  Unable to maintain sterile field.  Pt. Thrashing in bed and agitated.  Procedure stopped.

## 2017-04-30 NOTE — Progress Notes (Signed)
Paged IV team consult.  Pts daughter Christy [name]'s number [phone] is at home for consent PICC line to obtain consent due to patient confusion.

## 2017-04-30 NOTE — Progress Notes (Signed)
Paged [facility] hospitalists pt need PICC, IV team inquire which arm OK. Pt has bilateral arm fx's.

## 2017-04-30 NOTE — Clinical Social Work Note (Addendum)
Patient medically stable for discharge back to [facility] today. Facility liaison, Joaquim Nam and daughter, Margorie John [phone]) contacted and advised. Ms. Borden will be transported by ambulance back to facility.  **CSW informed by nurse at 3:42 pm that discharge cancelled due to medical concerns. [place] contacted and updated. CSW will continue to follow and facilitate discharge back to [facility] when medically stable.    Arnel Wymer Givens, MSW, LCSW Licensed Clinical Social Worker [place] [phone]

## 2017-04-30 NOTE — Progress Notes (Signed)
Urine culture showed resistance to Keflex so we will need PICC and pharmacy consulted for Ertapenem

## 2017-05-01 LAB — GLUCOSE, CAPILLARY: GLUCOSE-CAPILLARY: 97 mg/dL (ref 65–99)

## 2017-05-01 MED ORDER — ERTAPENEM SODIUM 1 G IJ SOLR
1.0000 g | INTRAMUSCULAR | Status: DC
  Administered 2017-05-01: 1 g via INTRAMUSCULAR
  Filled 2017-05-01: qty 1

## 2017-05-01 MED ORDER — METOCLOPRAMIDE HCL 5 MG PO TABS: 5.0000 mg | ORAL_TABLET | Freq: Three times a day (TID) | ORAL | 0 refills | Status: DC

## 2017-05-01 MED ORDER — ERTAPENEM SODIUM 1 G IJ SOLR: 1.0000 g | INTRAMUSCULAR | 0 refills | Status: AC

## 2017-05-01 NOTE — Progress Notes (Addendum)
CSW arranged transport for pt to return to [facility].  Daughter has been notified and agreeable to plan.  RN Lambert Keto notified and will call report to [phone].  Creta Levin, LCSW Weekend Coverage [phone]

## 2017-05-01 NOTE — Progress Notes (Signed)
Christy [name] to be D/C'd Skilled nursing facility per MD order.  Discussed prescriptions and follow up appointments with the patient. Prescriptions given to patient, medication list explained in detail. Pt verbalized understanding.  Allergies as of 05/01/2017   No Known Allergies     Medication List    STOP taking these medications   alendronate 70 MG tablet Commonly known as:  FOSAMAX   Fish Oil 1200 MG Caps   ibuprofen 800 MG tablet Commonly known as:  ADVIL,MOTRIN   insulin aspart 100 UNIT/ML injection Commonly known as:  novoLOG   oxyCODONE 5 MG immediate release tablet Commonly known as:  Oxy IR/ROXICODONE     TAKE these medications   acetaminophen 325 MG tablet Commonly known as:  TYLENOL Take 2 tablets (650 mg total) by mouth every 6 (six) hours as needed for mild pain.   amLODipine 10 MG tablet Commonly known as:  NORVASC Take 1 tablet (10 mg total) by mouth daily.   aspirin EC 81 MG tablet Take 81 mg by mouth daily.   atorvastatin 20 MG tablet Commonly known as:  LIPITOR Take 20 mg by mouth at bedtime.   bisacodyl 10 MG suppository Commonly known as:  DULCOLAX Place 1 suppository (10 mg total) rectally daily as needed for moderate constipation.   citalopram 20 MG tablet Commonly known as:  CELEXA Take 1 tablet (20 mg total) by mouth daily.   docusate sodium 100 MG capsule Commonly known as:  COLACE Take 100 mg by mouth 2 (two) times daily. Hold for loose stool   ertapenem 1 g injection Commonly known as:  INVANZ Inject 1 g into the muscle daily.   fenofibrate 48 MG tablet Commonly known as:  TRICOR Take 1 tablet (48 mg total) by mouth daily.   furosemide 40 MG tablet Commonly known as:  LASIX Take 1 tablet (40 mg total) by mouth daily.   glimepiride 2 MG tablet Commonly known as:  AMARYL Take 1 tablet (2 mg total) by mouth daily before breakfast.   HYDROcodone-acetaminophen 5-325 MG tablet Commonly known as:  NORCO/VICODIN Take 1 tablet  by mouth every 4 (four) hours as needed for moderate pain.   insulin detemir 100 UNIT/ML injection Commonly known as:  LEVEMIR Inject 0.26 mLs (26 Units total) into the skin at bedtime.   lisinopril 40 MG tablet Commonly known as:  PRINIVIL,ZESTRIL Take 1 tablet (40 mg total) by mouth daily.   metFORMIN 500 MG 24 hr tablet Commonly known as:  GLUCOPHAGE-XR Take 2 tablets (1,000 mg total) by mouth 2 (two) times daily. What changed:  how much to take   metoCLOPramide 5 MG tablet Commonly known as:  REGLAN Take 1 tablet (5 mg total) by mouth 3 (three) times daily before meals.   mirtazapine 7.5 MG tablet Commonly known as:  REMERON Take 7.5 mg by mouth at bedtime. FOR APPETITE   ondansetron 4 MG tablet Commonly known as:  ZOFRAN Take 4 mg by mouth every 6 (six) hours.   potassium chloride SA 20 MEQ tablet Commonly known as:  K-DUR,KLOR-CON Take 1 tablet (20 mEq total) by mouth daily. What changed:  medication strength  how much to take   senna 8.6 MG Tabs tablet Commonly known as:  SENOKOT Take 1 tablet (8.6 mg total) by mouth daily.   solifenacin 10 MG tablet Commonly known as:  VESICARE Take 1 tablet (10 mg total) by mouth daily.   UNABLE TO FIND House 2.0/Mes pass: Drink 90 ml's by mouth three times  a day for poor meal intake   Vitamin D 2000 units tablet Take 2,000 Units by mouth daily.       Vitals:   05/01/17 0942 05/01/17 1126  BP: (!) 169/107 (!) 140/109  Pulse: 72 69  Resp: 16 17  Temp: 97.8 F (36.6 C) 98.3 F (36.8 C)    Skin clean, dry and intact without evidence of skin break down, no evidence of skin tears noted. IV catheter discontinued intact. Site without signs and symptoms of complications. Dressing and pressure applied. Pt denies pain at this time. No complaints noted.  An After Visit Summary was printed and given to the patient. Patient escorted via Biomedical scientist, and D/C to [facility]  via [employer].Haywood Lasso BSN, RN [facility]  [address] Phone [phone]

## 2017-05-01 NOTE — Discharge Summary (Signed)
Christy [name], is a 76 y.o. female  DOB Mar 03, 1941  MRN [id number].  Admission date:  04/27/2017  Admitting Physician  Waldemar Dickens, MD  Discharge Date:  05/01/2017   Primary MD  [name], MD  Recommendations for primary care physician for things to follow:   Intractable Nausea and vomiting and abdominal pain . Viral versus bacterial versus constipation vs uti.  Cont zofran Added reglan  UTI secondary to Klebsilla resistant, needs Invanz 1gm im qday x 7 days  Hypokalemia, likely due to vomiting.  repleted Started on Kcl 51meq po qday cmp in 1 week  HypertensionControlled Continue home anti-hypertensive medications   Type II Diabetes  Resume metformin, but if this causes n/v then please d/c Cont levemir   Failure to thrive. Albumin 2.7  Continue Remeron when able to eat  medpass  Depression Continue home Celexa, Enablex   Admission Diagnosis  Dehydration [E86.0] Hypokalemia [E87.6]   Discharge Diagnosis  Dehydration [E86.0] Hypokalemia [E87.6]    Active Problems:   Breast cancer of upper-outer quadrant of right female breast (HCC)   Diabetes ([place])   Osteoporosis   Hyperlipidemia   Essential hypertension   Multiple fractures   Leukocytosis   Dehydration   Anemia   UTI (urinary tract infection)   Constipation   Failure to thrive (0-17)   Hypokalemia   Moderate protein-calorie malnutrition (HCC)   Diabetes mellitus with complication (HCC)   Nausea & vomiting   Klebsiella pneumoniae infection      Past Medical History:  Diagnosis Date  . Abnormal ECG    Inferior Q waves  . Cancer ([place])    stage II right breast cancer  . Cataract   . Diabetes mellitus   . Hypercholesterolemia   . Hyperlipidemia   . Hypertension   . Osteoporosis   . Overactive bladder     Past Surgical History:  Procedure Laterality Date  . BREAST SURGERY     right  lumpectomy, sentinel node biopsy  . CATARACT EXTRACTION Bilateral 2008  . FRACTURE SURGERY     ORIF right distal radius fx  . JOINT REPLACEMENT     right hip  . ORIF RADIAL FRACTURE Bilateral 03/29/2017   Procedure: OPEN REDUCTION INTERNAL FIXATION (ORIF) RADIAL FRACTURES;  Surgeon: Iran Planas, MD;  Location: [facility];  Service: Orthopedics;  Laterality: Bilateral;  . PATELLECTOMY Left 03/31/2017   Procedure: LEFT PARTIAL PATELLECTOMY;  Surgeon: Leandrew Koyanagi, MD;  Location: [facility];  Service: Orthopedics;  Laterality: Left;  . PORT-A-CATH REMOVAL  09/11/2011   Procedure: REMOVAL PORT-A-CATH;  Surgeon: Rolm Bookbinder, MD;  Location: [facility];  Service: General;  Laterality: Left;  local   . PORTACATH PLACEMENT         HPI  from the history and physical done on the day of admission:      76 y.o.femalefrom [place] with medical history significant for DM, HTN, HLD, sacral ulcer,recent bilateral forearms fracture, R arm fracture due to osteoporosis and falls, priorly seen at  the ED 1 week ago with possible bladder outlet obstruction, d/c with foley, which was changed on 6/25. In the interim, patient's overall status has declined, with FTT, and vomiting about 5 times prior to admission . History was obtained by EDP, chart and is limited due to language barrier and overal clinical status. No food poisoning. Denies fevers, chills, night sweats, vision changes Denies any respiratory complaints. Denies any chest pain or palpitations. Denies lower extremity swelling. She reports abdominal pain and constipation .Denies abnormal skin rashes Denies any bleeding issues such as epistaxis, hematemesis, hematuria or hematochezia.    ED Course:BP (!) 149/61  Pulse 78  Temp 98.5 F (36.9 C) (Oral)  Resp 16  Ht 5\' 7"  (1.702 m)  Wt 99.8 kg (220 lb)  SpO2 94%  BMI 34.46 kg/m  Rocephin IV IV bolus 2 L  IV Zofran given  WBC 11.7 GLu 221     Hospital Course:       Pt was admitted for n/v, and UTI.  N/v resolved,   Pt denies abdomina pain, and has been afebrile. CT abd/pelvis 04/27/2017=>no acute abnormality, large amount of stool, cholelithiasis vs gall bladder sludge RUQ ultrasound 6/27=> no cholelithiasis, prominent common bile duct, fatty liver   Her urine culture grew Klebsiella pneumonia sensitivites pending.  Pt was treated w rocephin 6/26=> 6/28 and then converted to keflex 500mg  po bid.  Pt has tolerated this.  Blood culture 6/26 => ngtd.   I spoke with her daughter yesterday and she agreed that patient was doing well and better than her baseline. Pt has been afebrile and will be discharged to SNF  Her discharge was delayed due to resistance of Klebsiella,  Picc attempted multiple times and failed will use IM INvanz  Follow UP   Contact information for follow-up providers    [name], [name], MD Follow up in 1 week(s).   Specialty:  Family Medicine Contact information: [place] [address] [phone]            Contact information for after-discharge care    Destination    [facility] .   Specialty:  [place] information: [address] [place] [phone]                   Consults obtained - none  Discharge Condition: stable  Diet and Activity recommendation: See Discharge Instructions below  Discharge Instructions         Discharge Medications     Allergies as of 05/01/2017   No Known Allergies     Medication List    STOP taking these medications   alendronate 70 MG tablet Commonly known as:  FOSAMAX   Fish Oil 1200 MG Caps   ibuprofen 800 MG tablet Commonly known as:  ADVIL,MOTRIN   insulin aspart 100 UNIT/ML injection Commonly known as:  novoLOG   oxyCODONE 5 MG immediate release tablet Commonly known as:  Oxy IR/ROXICODONE     TAKE these medications   acetaminophen 325 MG  tablet Commonly known as:  TYLENOL Take 2 tablets (650 mg total) by mouth every 6 (six) hours as needed for mild pain.   amLODipine 10 MG tablet Commonly known as:  NORVASC Take 1 tablet (10 mg total) by mouth daily.   aspirin EC 81 MG tablet Take 81 mg by mouth daily.   atorvastatin 20 MG tablet Commonly known as:  LIPITOR Take 20 mg by mouth at  bedtime.   bisacodyl 10 MG suppository Commonly known as:  DULCOLAX Place 1 suppository (10 mg total) rectally daily as needed for moderate constipation.   citalopram 20 MG tablet Commonly known as:  CELEXA Take 1 tablet (20 mg total) by mouth daily.   docusate sodium 100 MG capsule Commonly known as:  COLACE Take 100 mg by mouth 2 (two) times daily. Hold for loose stool   ertapenem 1 g injection Commonly known as:  INVANZ Inject 1 g into the muscle daily.   fenofibrate 48 MG tablet Commonly known as:  TRICOR Take 1 tablet (48 mg total) by mouth daily.   furosemide 40 MG tablet Commonly known as:  LASIX Take 1 tablet (40 mg total) by mouth daily.   glimepiride 2 MG tablet Commonly known as:  AMARYL Take 1 tablet (2 mg total) by mouth daily before breakfast.   HYDROcodone-acetaminophen 5-325 MG tablet Commonly known as:  NORCO/VICODIN Take 1 tablet by mouth every 4 (four) hours as needed for moderate pain.   insulin detemir 100 UNIT/ML injection Commonly known as:  LEVEMIR Inject 0.26 mLs (26 Units total) into the skin at bedtime.   lisinopril 40 MG tablet Commonly known as:  PRINIVIL,ZESTRIL Take 1 tablet (40 mg total) by mouth daily.   metFORMIN 500 MG 24 hr tablet Commonly known as:  GLUCOPHAGE-XR Take 2 tablets (1,000 mg total) by mouth 2 (two) times daily. What changed:  how much to take   metoCLOPramide 5 MG tablet Commonly known as:  REGLAN Take 1 tablet (5 mg total) by mouth 3 (three) times daily before meals.   mirtazapine 7.5 MG tablet Commonly known as:  REMERON Take 7.5 mg by mouth at bedtime. FOR  APPETITE   ondansetron 4 MG tablet Commonly known as:  ZOFRAN Take 4 mg by mouth every 6 (six) hours.   potassium chloride SA 20 MEQ tablet Commonly known as:  K-DUR,KLOR-CON Take 1 tablet (20 mEq total) by mouth daily. What changed:  medication strength  how much to take   senna 8.6 MG Tabs tablet Commonly known as:  SENOKOT Take 1 tablet (8.6 mg total) by mouth daily.   solifenacin 10 MG tablet Commonly known as:  VESICARE Take 1 tablet (10 mg total) by mouth daily.   UNABLE TO FIND House 2.0/Mes pass: Drink 90 ml's by mouth three times a day for poor meal intake   Vitamin D 2000 units tablet Take 2,000 Units by mouth daily.       Major procedures and Radiology Reports - PLEASE review detailed and final reports for all details, in brief -      Ct Abdomen Pelvis Wo Contrast  Result Date: 04/15/2017 CLINICAL DATA:  Possible free air on chest x-ray earlier today. EXAM: CT ABDOMEN AND PELVIS WITHOUT CONTRAST TECHNIQUE: Multidetector CT imaging of the abdomen and pelvis was performed following the standard protocol without IV contrast. COMPARISON:  06/27/2010 FINDINGS: Lower chest: Subsegmental atelectasis at the lung bases. Heart is enlarged with coronary artery calcification evident. Hepatobiliary: No focal abnormality in the liver on this study without intravenous contrast. There is no evidence for gallstones, gallbladder wall thickening, or pericholecystic fluid. Extrahepatic common duct is dilated at 13 mm without etiology evident. No associated intrahepatic biliary duct dilatation. Pancreas: Insert pancreas Spleen: No splenomegaly. No focal mass lesion. Adrenals/Urinary Tract: Stable thickening right adrenal gland with attenuation suggesting adenoma. 2.6 cm left adrenal nodule cannot be characterized as an adenoma today based on attenuation, but was present on the  study from 7 years ago and measured 2.3 cm at that time, most compatible with a benign etiology. 2 mm  nonobstructing stone identified interpolar right kidney. Left kidney unremarkable. No evidence for hydroureter. Urinary bladder is markedly distended with the dome at the level of the umbilicus. Stomach/Bowel: Stomach is nondistended. No gastric wall thickening. No evidence of outlet obstruction. Duodenum is normally positioned as is the ligament of Treitz. No small bowel wall thickening. No small bowel dilatation. The terminal ileum is normal. The appendix is normal. No gross colonic mass. No colonic wall thickening. No substantial diverticular change. Vascular/Lymphatic: There is abdominal aortic atherosclerosis without aneurysm. There is no gastrohepatic or hepatoduodenal ligament lymphadenopathy. No intraperitoneal or retroperitoneal lymphadenopathy. No pelvic sidewall lymphadenopathy. Reproductive: The uterus has normal CT imaging appearance. There is no adnexal mass. Other: No intraperitoneal free fluid. Musculoskeletal: Right hip replacement generates streak artifact obscuring the inferior right anatomic pelvis. Bone windows reveal no worrisome lytic or sclerotic osseous lesions. Mild compression deformity noted at L1 similar to prior. IMPRESSION: 1. No intraperitoneal free air. Appearance on x-ray earlier today presumably related to basilar atelectasis. 2. Marked bladder distention. Component of bladder outlet obstruction suggested by CT. 3. Mild extrahepatic biliary duct dilatation of indeterminate etiology. Correlation with liver function test may prove helpful. 4. Bilateral adrenal nodules, with right adrenal nodule attenuation compatible with adenoma. Larger left adrenal nodule is similar to the study from 7 years ago suggesting benign etiology such as lipid poor adenoma. 5. Tiny nonobstructing right renal stone. 6.  Aortic Atherosclerois (ICD10-170.0) Electronically Signed   By: Misty Stanley M.D.   On: 04/15/2017 19:48   Dg Chest 2 View  Result Date: 04/15/2017 CLINICAL DATA:  Altered mental  status, cough and weakness EXAM: CHEST  2 VIEW COMPARISON:  Portable chest x-ray of 04/03/2017 FINDINGS: The lungs are not well aerated with persistent bibasilar atelectasis and/or scarring. However on the frontal view which is semi-erect, free air cannot be excluded under the right hemidiaphragm. Left lateral decubitus of the abdomen is recommended versus CT of the abdomen which is more sensitive to exclude free air. Mediastinal and hilar contours are unchanged and cardiomegaly is stable. Probable old right lateral rib fractures are noted. There are degenerative changes diffusely throughout the thoracic spine. IMPRESSION: 1. Lucency under the right hemidiaphragm. Cannot exclude free intraperitoneal air. Recommend either left lateral decubitus of the abdomen or CT of the abdomen pelvis to exclude free air. 2. Persistent bibasilar atelectasis or scarring. Critical Value/emergent results were called by telephone at the time of interpretation on 04/15/2017 at 5:12 pm to PA NICOLE NADEAU , who verbally acknowledged these results. Electronically Signed   By: Ivar Drape M.D.   On: 04/15/2017 17:12   Ct Abdomen Pelvis W Contrast  Result Date: 04/27/2017 CLINICAL DATA:  76 year old female with acute abdominal pain and vomiting. EXAM: CT ABDOMEN AND PELVIS WITH CONTRAST TECHNIQUE: Multidetector CT imaging of the abdomen and pelvis was performed using the standard protocol following bolus administration of intravenous contrast. CONTRAST:  75 cc intravenous Omnipaque 300 COMPARISON:  04/15/2017 and prior CTs FINDINGS: Right hip replacement obscures detail within the pelvis. Lower chest: Bibasilar atelectasis/ scarring again noted. Cardiomegaly and heavy coronary artery calcifications again identified. Hepatobiliary: The liver is unremarkable. Probable cholelithiasis versus gallbladder sludge noted. There is no evidence of biliary dilatation. Pancreas: Atrophic without other significant abnormality Spleen: Unremarkable  Adrenals/Urinary Tract: Bilateral adrenal masses/adenomas are unchanged from 2010 with the largest measuring 2.6 cm on the left. Renal cortical  atrophy, small left renal cysts and a punctate nonobstructing mid right renal calculus noted. A Foley catheter is present within the bladder. There is no evidence of hydronephrosis. Stomach/Bowel: There is no evidence of bowel obstruction or definite bowel wall thickening. A large amount of rectal stool is present. No inflammatory changes are identified. The appendix is normal. Vascular/Lymphatic: Aortic atherosclerosis. No enlarged abdominal or pelvic lymph nodes. Reproductive: Uterus and bilateral adnexa are unremarkable. Other: No ascites, abscess or pneumoperitoneum. Musculoskeletal: No acute abnormalities. Right total hip replacement, remote L1 compression fracture and degenerative changes within the lumbar spine again noted. IMPRESSION: No evidence of acute abnormality. Large amount of rectal stool. Cholelithiasis versus gallbladder sludge. Cardiomegaly and coronary artery disease. Aortic Atherosclerosis (ICD10-I70.0). Electronically Signed   By: Margarette Canada M.D.   On: 04/27/2017 15:28   Dg Chest Port 1 View  Result Date: 04/03/2017 CLINICAL DATA:  Breast cancer, hypertension, diabetes, hypoxia EXAM: PORTABLE CHEST 1 VIEW COMPARISON:  03/08/2017 FINDINGS: Similar cardiomegaly with chronic vascular congestion and interstitial prominence. Increased bibasilar atelectasis. No large effusion or pneumothorax. Degenerative changes of the spine. Aorta is atherosclerotic and ectatic. Bones are osteopenic. IMPRESSION: Similar cardiomegaly with chronic vascular congestion and interstitial prominence. Increased bibasilar atelectasis. Electronically Signed   By: Jerilynn Mages.  Shick M.D.   On: 04/03/2017 13:17   Dg Abd 2 Views  Result Date: 04/29/2017 CLINICAL DATA:  Nausea, vomiting EXAM: ABDOMEN - 2 VIEW COMPARISON:  CT 04/27/2017 FINDINGS: Nonobstructive bowel gas pattern. No free  air, organomegaly or suspicious calcification. Vascular calcifications noted. Degenerative changes in the lumbar spine. Prior right hip replacement. IMPRESSION: No acute findings. Electronically Signed   By: Rolm Baptise M.D.   On: 04/29/2017 11:55   Dg Abd Acute W/chest  Result Date: 04/27/2017 CLINICAL DATA:  Abdominal pain and vomiting EXAM: DG ABDOMEN ACUTE W/ 1V CHEST COMPARISON:  04/15/2017 FINDINGS: Cardiac shadow is again enlarged. Aortic calcifications are again seen. Scarring is noted in the bases bilaterally stable from the prior exam. Scattered large and small bowel gas is noted. No abnormal mass or abnormal calcifications are noted. Right hip replacement is seen. No free air is noted. Degenerative changes of the lumbar spine are seen with compression deformity of L1 of chronic nature. IMPRESSION: Chronic changes without acute abnormality. Electronically Signed   By: Inez Catalina M.D.   On: 04/27/2017 13:09   US Abdomen Limited Ruq  Result Date: 04/28/2017 CLINICAL DATA:  Abdominal pain, follow-up from abnormal CT on the previous day EXAM: ULTRASOUND ABDOMEN LIMITED RIGHT UPPER QUADRANT COMPARISON:  04/27/2017 FINDINGS: Gallbladder: Gallbladder is well distended without evidence of cholelithiasis or pericholecystic fluid. Common bile duct: Diameter: 10 mm. This is somewhat prominent. No definitive calculus is identified. Correlation laboratory values is recommended. Liver: Diffusely heterogeneous and increased in echogenicity consistent with fatty infiltration. IMPRESSION: No evidence of cholelithiasis. Prominent common bile duct without definitive stone. Correlation with laboratory values is recommended. Changes consistent with fatty infiltration of the liver. Electronically Signed   By: Inez Catalina M.D.   On: 04/28/2017 08:55    Micro Results     Recent Results (from the past 240 hour(s))  Urine culture     Status: Abnormal   Collection Time: 04/27/17 12:06 PM  Result Value Ref Range  Status   Specimen Description URINE, RANDOM  Final   Special Requests NONE  Final   Culture (A)  Final    >=100,000 COLONIES/mL KLEBSIELLA PNEUMONIAE Confirmed Extended Spectrum Beta-Lactamase Producer (ESBL)    Report Status 04/30/2017  FINAL  Final   Organism ID, Bacteria KLEBSIELLA PNEUMONIAE (A)  Final      Susceptibility   Klebsiella pneumoniae - MIC*    AMPICILLIN >=32 RESISTANT Resistant     CEFAZOLIN >=64 RESISTANT Resistant     CEFTRIAXONE >=64 RESISTANT Resistant     CIPROFLOXACIN >=4 RESISTANT Resistant     GENTAMICIN >=16 RESISTANT Resistant     IMIPENEM <=0.25 SENSITIVE Sensitive     NITROFURANTOIN 64 INTERMEDIATE Intermediate     TRIMETH/SULFA 80 RESISTANT Resistant     AMPICILLIN/SULBACTAM >=32 RESISTANT Resistant     PIP/TAZO >=128 RESISTANT Resistant     Extended ESBL POSITIVE Resistant     * >=100,000 COLONIES/mL KLEBSIELLA PNEUMONIAE  Culture, blood (Routine X 2) w Reflex to ID Panel     Status: None (Preliminary result)   Collection Time: 04/27/17  6:14 PM  Result Value Ref Range Status   Specimen Description BLOOD LEFT ANTECUBITAL  Final   Special Requests   Final    BOTTLES DRAWN AEROBIC AND ANAEROBIC Blood Culture adequate volume   Culture NO GROWTH 3 DAYS  Final   Report Status PENDING  Incomplete  Culture, blood (Routine X 2) w Reflex to ID Panel     Status: None (Preliminary result)   Collection Time: 04/27/17  6:24 PM  Result Value Ref Range Status   Specimen Description BLOOD LEFT ANTECUBITAL  Final   Special Requests   Final    BOTTLES DRAWN AEROBIC AND ANAEROBIC Blood Culture adequate volume   Culture NO GROWTH 3 DAYS  Final   Report Status PENDING  Incomplete       Today   Subjective    Christy [name] today has no headache,no chest abdominal pain,no new weakness tingling or numbness, feels much better wants to go SNF  today.    Objective   Blood pressure (!) 135/106, pulse 71, temperature 98.9 F (37.2 C), temperature source Oral,  resp. rate 17, height 5\' 7"  (1.702 m), weight 96.6 kg (212 lb 15.4 oz), SpO2 98 %.   Intake/Output Summary (Last 24 hours) at 05/01/17 0830 Last data filed at 05/01/17 0600  Gross per 24 hour  Intake              480 ml  Output              325 ml  Net              155 ml    Exam Awake Alert, Oriented x 3, No new F.N deficits, Normal affect [place].AT,PERRAL Supple Neck,No JVD, No cervical lymphadenopathy appriciated.  Symmetrical Chest wall movement, Good air movement bilaterally, CTAB RRR,No Gallops,Rubs or new Murmurs, No Parasternal Heave +ve B.Sounds, Abd Soft, Non tender, No organomegaly appriciated, No rebound -guarding or rigidity. No Cyanosis, Clubbing or edema, No new Rash or bruise   Data Review   CBC w Diff: Lab Results  Component Value Date   WBC 7.5 04/29/2017   HGB 11.0 (L) 04/29/2017   HGB 12.5 08/03/2016   HCT 35.0 (L) 04/29/2017   HCT 37.2 08/03/2016   PLT 231 04/29/2017   PLT 205 08/03/2016   LYMPHOPCT 10 04/27/2017   LYMPHOPCT 24.4 08/03/2016   MONOPCT 8 04/27/2017   MONOPCT 7.7 08/03/2016   EOSPCT 0 04/27/2017   EOSPCT 0.8 08/03/2016   BASOPCT 0 04/27/2017   BASOPCT 0.1 08/03/2016    CMP: Lab Results  Component Value Date   NA 138 04/29/2017   NA 139 08/03/2016  K 3.4 (L) 04/29/2017   K 4.7 08/03/2016   CL 102 04/29/2017   CL 103 03/21/2013   CO2 25 04/29/2017   CO2 23 08/03/2016   BUN 11 04/29/2017   BUN 29.5 (H) 08/03/2016   CREATININE 0.56 04/29/2017   CREATININE 1.07 (H) 01/06/2017   CREATININE 1.2 (H) 08/03/2016   PROT 6.2 (L) 04/29/2017   PROT 7.3 08/03/2016   ALBUMIN 2.3 (L) 04/29/2017   ALBUMIN 3.4 (L) 08/03/2016   BILITOT 0.5 04/29/2017   BILITOT 0.30 08/03/2016   ALKPHOS 94 04/29/2017   ALKPHOS 71 08/03/2016   AST 17 04/29/2017   AST 20 08/03/2016   ALT 15 04/29/2017   ALT 30 08/03/2016  .   Total Time in preparing paper work, data evaluation and todays exam - 103 minutes  Jani Gravel M.D on 05/01/2017 at 8:30 AM  [facility]   Office  [phone]

## 2017-05-01 NOTE — Progress Notes (Signed)
Report called to Museum/gallery conservator at [facility]. Pt will be discharged back to facility today. No PIV. Foley Cath will be removed and family aware. Transport notified.

## 2017-05-01 NOTE — Progress Notes (Signed)
Pt refused breakfast and PO medications at 10am. Pt drank a small amount of liquid.

## 2017-05-02 LAB — CULTURE, BLOOD (ROUTINE X 2)
CULTURE: NO GROWTH
Culture: NO GROWTH
Special Requests: ADEQUATE
Special Requests: ADEQUATE

## 2017-05-04 ENCOUNTER — Ambulatory Visit: Payer: Medicare Other | Admitting: Internal Medicine

## 2017-05-05 DIAGNOSIS — J189 Pneumonia, unspecified organism: Secondary | ICD-10-CM | POA: Insufficient documentation

## 2017-05-10 ENCOUNTER — Ambulatory Visit (INDEPENDENT_AMBULATORY_CARE_PROVIDER_SITE_OTHER): Payer: Medicare Other | Admitting: Orthopaedic Surgery

## 2017-05-10 ENCOUNTER — Encounter (INDEPENDENT_AMBULATORY_CARE_PROVIDER_SITE_OTHER): Payer: Self-pay | Admitting: Orthopaedic Surgery

## 2017-05-10 DIAGNOSIS — S82042A Displaced comminuted fracture of left patella, initial encounter for closed fracture: Secondary | ICD-10-CM

## 2017-05-10 NOTE — Progress Notes (Signed)
Patient is 6 weeks status post left partial patellectomy me for comminuted patella fracture. She states that she is in constant pain and is mainly ambulating wheelchair. She is currently at a nursing home. Overall she has a very flat affect and seems very depressed about her current situation. She is not really cooperating with the exam. She just complains of pain throughout her knee. Her surgical scar is fully healed. She has about a 20 extensor lag and then a Bledsoe brace was increased to 9 of flexion. Continue with physical therapy and joint mobilization and gait training. Follow-up with me in 6 weeks.

## 2017-06-15 ENCOUNTER — Emergency Department (HOSPITAL_COMMUNITY): Payer: Medicare Other

## 2017-06-15 ENCOUNTER — Inpatient Hospital Stay (HOSPITAL_COMMUNITY)
Admission: EM | Admit: 2017-06-15 | Discharge: 2017-07-08 | DRG: 871 | Disposition: A | Discharge status: Hospice | Payer: Medicare Other | Attending: Internal Medicine | Admitting: Internal Medicine

## 2017-06-15 ENCOUNTER — Inpatient Hospital Stay (HOSPITAL_COMMUNITY): Payer: Medicare Other

## 2017-06-15 ENCOUNTER — Encounter (HOSPITAL_COMMUNITY): Payer: Self-pay | Admitting: Emergency Medicine

## 2017-06-15 DIAGNOSIS — Z6831 Body mass index (BMI) 31.0-31.9, adult: Secondary | ICD-10-CM

## 2017-06-15 DIAGNOSIS — T790XXA Air embolism (traumatic), initial encounter: Secondary | ICD-10-CM | POA: Diagnosis not present

## 2017-06-15 DIAGNOSIS — I248 Other forms of acute ischemic heart disease: Secondary | ICD-10-CM | POA: Diagnosis present

## 2017-06-15 DIAGNOSIS — J9620 Acute and chronic respiratory failure, unspecified whether with hypoxia or hypercapnia: Secondary | ICD-10-CM | POA: Diagnosis not present

## 2017-06-15 DIAGNOSIS — R0600 Dyspnea, unspecified: Secondary | ICD-10-CM | POA: Diagnosis not present

## 2017-06-15 DIAGNOSIS — Y92230 Patient room in hospital as the place of occurrence of the external cause: Secondary | ICD-10-CM | POA: Diagnosis present

## 2017-06-15 DIAGNOSIS — R001 Bradycardia, unspecified: Secondary | ICD-10-CM | POA: Diagnosis not present

## 2017-06-15 DIAGNOSIS — Z66 Do not resuscitate: Secondary | ICD-10-CM | POA: Diagnosis not present

## 2017-06-15 DIAGNOSIS — L89622 Pressure ulcer of left heel, stage 2: Secondary | ICD-10-CM | POA: Diagnosis present

## 2017-06-15 DIAGNOSIS — Z452 Encounter for adjustment and management of vascular access device: Secondary | ICD-10-CM

## 2017-06-15 DIAGNOSIS — Z1612 Extended spectrum beta lactamase (ESBL) resistance: Secondary | ICD-10-CM | POA: Diagnosis present

## 2017-06-15 DIAGNOSIS — R1310 Dysphagia, unspecified: Secondary | ICD-10-CM | POA: Diagnosis not present

## 2017-06-15 DIAGNOSIS — R531 Weakness: Secondary | ICD-10-CM

## 2017-06-15 DIAGNOSIS — E43 Unspecified severe protein-calorie malnutrition: Secondary | ICD-10-CM | POA: Insufficient documentation

## 2017-06-15 DIAGNOSIS — Z7982 Long term (current) use of aspirin: Secondary | ICD-10-CM

## 2017-06-15 DIAGNOSIS — Z823 Family history of stroke: Secondary | ICD-10-CM

## 2017-06-15 DIAGNOSIS — D6489 Other specified anemias: Secondary | ICD-10-CM | POA: Diagnosis present

## 2017-06-15 DIAGNOSIS — Z79899 Other long term (current) drug therapy: Secondary | ICD-10-CM

## 2017-06-15 DIAGNOSIS — N390 Urinary tract infection, site not specified: Secondary | ICD-10-CM | POA: Diagnosis present

## 2017-06-15 DIAGNOSIS — Z01818 Encounter for other preprocedural examination: Secondary | ICD-10-CM

## 2017-06-15 DIAGNOSIS — Z96641 Presence of right artificial hip joint: Secondary | ICD-10-CM | POA: Diagnosis present

## 2017-06-15 DIAGNOSIS — R6521 Severe sepsis with septic shock: Secondary | ICD-10-CM

## 2017-06-15 DIAGNOSIS — Z515 Encounter for palliative care: Secondary | ICD-10-CM | POA: Diagnosis not present

## 2017-06-15 DIAGNOSIS — Z794 Long term (current) use of insulin: Secondary | ICD-10-CM

## 2017-06-15 DIAGNOSIS — N179 Acute kidney failure, unspecified: Secondary | ICD-10-CM | POA: Diagnosis not present

## 2017-06-15 DIAGNOSIS — L8990 Pressure ulcer of unspecified site, unspecified stage: Secondary | ICD-10-CM | POA: Insufficient documentation

## 2017-06-15 DIAGNOSIS — Y95 Nosocomial condition: Secondary | ICD-10-CM | POA: Diagnosis present

## 2017-06-15 DIAGNOSIS — I472 Ventricular tachycardia: Secondary | ICD-10-CM | POA: Diagnosis not present

## 2017-06-15 DIAGNOSIS — B961 Klebsiella pneumoniae [K. pneumoniae] as the cause of diseases classified elsewhere: Secondary | ICD-10-CM | POA: Diagnosis not present

## 2017-06-15 DIAGNOSIS — Z7189 Other specified counseling: Secondary | ICD-10-CM | POA: Diagnosis not present

## 2017-06-15 DIAGNOSIS — E11621 Type 2 diabetes mellitus with foot ulcer: Secondary | ICD-10-CM | POA: Diagnosis present

## 2017-06-15 DIAGNOSIS — E11649 Type 2 diabetes mellitus with hypoglycemia without coma: Secondary | ICD-10-CM | POA: Diagnosis present

## 2017-06-15 DIAGNOSIS — G92 Toxic encephalopathy: Secondary | ICD-10-CM | POA: Diagnosis present

## 2017-06-15 DIAGNOSIS — E669 Obesity, unspecified: Secondary | ICD-10-CM | POA: Diagnosis present

## 2017-06-15 DIAGNOSIS — E785 Hyperlipidemia, unspecified: Secondary | ICD-10-CM | POA: Diagnosis present

## 2017-06-15 DIAGNOSIS — L89151 Pressure ulcer of sacral region, stage 1: Secondary | ICD-10-CM | POA: Diagnosis present

## 2017-06-15 DIAGNOSIS — I429 Cardiomyopathy, unspecified: Secondary | ICD-10-CM | POA: Diagnosis not present

## 2017-06-15 DIAGNOSIS — J150 Pneumonia due to Klebsiella pneumoniae: Secondary | ICD-10-CM | POA: Diagnosis present

## 2017-06-15 DIAGNOSIS — K567 Ileus, unspecified: Secondary | ICD-10-CM | POA: Diagnosis present

## 2017-06-15 DIAGNOSIS — M810 Age-related osteoporosis without current pathological fracture: Secondary | ICD-10-CM | POA: Diagnosis present

## 2017-06-15 DIAGNOSIS — Z789 Other specified health status: Secondary | ICD-10-CM

## 2017-06-15 DIAGNOSIS — R0603 Acute respiratory distress: Secondary | ICD-10-CM

## 2017-06-15 DIAGNOSIS — R109 Unspecified abdominal pain: Secondary | ICD-10-CM

## 2017-06-15 DIAGNOSIS — Z9842 Cataract extraction status, left eye: Secondary | ICD-10-CM

## 2017-06-15 DIAGNOSIS — I110 Hypertensive heart disease with heart failure: Secondary | ICD-10-CM | POA: Diagnosis present

## 2017-06-15 DIAGNOSIS — E878 Other disorders of electrolyte and fluid balance, not elsewhere classified: Secondary | ICD-10-CM | POA: Diagnosis present

## 2017-06-15 DIAGNOSIS — J9601 Acute respiratory failure with hypoxia: Secondary | ICD-10-CM

## 2017-06-15 DIAGNOSIS — D638 Anemia in other chronic diseases classified elsewhere: Secondary | ICD-10-CM | POA: Diagnosis present

## 2017-06-15 DIAGNOSIS — I5043 Acute on chronic combined systolic (congestive) and diastolic (congestive) heart failure: Secondary | ICD-10-CM | POA: Diagnosis present

## 2017-06-15 DIAGNOSIS — E7800 Pure hypercholesterolemia, unspecified: Secondary | ICD-10-CM | POA: Diagnosis present

## 2017-06-15 DIAGNOSIS — A0472 Enterocolitis due to Clostridium difficile, not specified as recurrent: Secondary | ICD-10-CM | POA: Diagnosis not present

## 2017-06-15 DIAGNOSIS — Z4659 Encounter for fitting and adjustment of other gastrointestinal appliance and device: Secondary | ICD-10-CM

## 2017-06-15 DIAGNOSIS — Z853 Personal history of malignant neoplasm of breast: Secondary | ICD-10-CM

## 2017-06-15 DIAGNOSIS — A419 Sepsis, unspecified organism: Secondary | ICD-10-CM | POA: Diagnosis present

## 2017-06-15 DIAGNOSIS — B9689 Other specified bacterial agents as the cause of diseases classified elsewhere: Secondary | ICD-10-CM | POA: Diagnosis not present

## 2017-06-15 DIAGNOSIS — Z9841 Cataract extraction status, right eye: Secondary | ICD-10-CM

## 2017-06-15 DIAGNOSIS — R778 Other specified abnormalities of plasma proteins: Secondary | ICD-10-CM | POA: Diagnosis present

## 2017-06-15 DIAGNOSIS — I469 Cardiac arrest, cause unspecified: Secondary | ICD-10-CM | POA: Diagnosis not present

## 2017-06-15 DIAGNOSIS — I700 Atherosclerosis of aorta: Secondary | ICD-10-CM | POA: Diagnosis present

## 2017-06-15 DIAGNOSIS — E1165 Type 2 diabetes mellitus with hyperglycemia: Secondary | ICD-10-CM | POA: Diagnosis present

## 2017-06-15 DIAGNOSIS — R570 Cardiogenic shock: Secondary | ICD-10-CM | POA: Diagnosis present

## 2017-06-15 DIAGNOSIS — E872 Acidosis: Secondary | ICD-10-CM | POA: Diagnosis present

## 2017-06-15 DIAGNOSIS — Z978 Presence of other specified devices: Secondary | ICD-10-CM

## 2017-06-15 DIAGNOSIS — J9690 Respiratory failure, unspecified, unspecified whether with hypoxia or hypercapnia: Secondary | ICD-10-CM

## 2017-06-15 DIAGNOSIS — I447 Left bundle-branch block, unspecified: Secondary | ICD-10-CM | POA: Diagnosis present

## 2017-06-15 DIAGNOSIS — R748 Abnormal levels of other serum enzymes: Secondary | ICD-10-CM | POA: Diagnosis not present

## 2017-06-15 DIAGNOSIS — T502X5A Adverse effect of carbonic-anhydrase inhibitors, benzothiadiazides and other diuretics, initial encounter: Secondary | ICD-10-CM | POA: Diagnosis not present

## 2017-06-15 DIAGNOSIS — F329 Major depressive disorder, single episode, unspecified: Secondary | ICD-10-CM | POA: Diagnosis present

## 2017-06-15 DIAGNOSIS — Z87891 Personal history of nicotine dependence: Secondary | ICD-10-CM

## 2017-06-15 DIAGNOSIS — Y849 Medical procedure, unspecified as the cause of abnormal reaction of the patient, or of later complication, without mention of misadventure at the time of the procedure: Secondary | ICD-10-CM | POA: Diagnosis present

## 2017-06-15 DIAGNOSIS — Z8619 Personal history of other infectious and parasitic diseases: Secondary | ICD-10-CM

## 2017-06-15 DIAGNOSIS — J9600 Acute respiratory failure, unspecified whether with hypoxia or hypercapnia: Secondary | ICD-10-CM

## 2017-06-15 DIAGNOSIS — T4275XA Adverse effect of unspecified antiepileptic and sedative-hypnotic drugs, initial encounter: Secondary | ICD-10-CM | POA: Diagnosis not present

## 2017-06-15 DIAGNOSIS — J9811 Atelectasis: Secondary | ICD-10-CM | POA: Diagnosis present

## 2017-06-15 DIAGNOSIS — I952 Hypotension due to drugs: Secondary | ICD-10-CM | POA: Diagnosis present

## 2017-06-15 DIAGNOSIS — N3281 Overactive bladder: Secondary | ICD-10-CM | POA: Diagnosis present

## 2017-06-15 DIAGNOSIS — R9431 Abnormal electrocardiogram [ECG] [EKG]: Secondary | ICD-10-CM | POA: Diagnosis not present

## 2017-06-15 DIAGNOSIS — K760 Fatty (change of) liver, not elsewhere classified: Secondary | ICD-10-CM | POA: Diagnosis present

## 2017-06-15 DIAGNOSIS — Z87440 Personal history of urinary (tract) infections: Secondary | ICD-10-CM

## 2017-06-15 DIAGNOSIS — R7989 Other specified abnormal findings of blood chemistry: Secondary | ICD-10-CM | POA: Diagnosis present

## 2017-06-15 DIAGNOSIS — B952 Enterococcus as the cause of diseases classified elsewhere: Secondary | ICD-10-CM | POA: Diagnosis present

## 2017-06-15 DIAGNOSIS — Z8249 Family history of ischemic heart disease and other diseases of the circulatory system: Secondary | ICD-10-CM

## 2017-06-15 DIAGNOSIS — Z9889 Other specified postprocedural states: Secondary | ICD-10-CM

## 2017-06-15 DIAGNOSIS — E876 Hypokalemia: Secondary | ICD-10-CM | POA: Diagnosis present

## 2017-06-15 LAB — URINALYSIS, ROUTINE W REFLEX MICROSCOPIC
Bilirubin Urine: NEGATIVE
GLUCOSE, UA: NEGATIVE mg/dL
Ketones, ur: NEGATIVE mg/dL
NITRITE: NEGATIVE
PROTEIN: NEGATIVE mg/dL
RBC / HPF: NONE SEEN RBC/hpf (ref 0–5)
SPECIFIC GRAVITY, URINE: 1.013 (ref 1.005–1.030)
pH: 7 (ref 5.0–8.0)

## 2017-06-15 LAB — CBC WITH DIFFERENTIAL/PLATELET
Basophils Absolute: 0 10*3/uL (ref 0.0–0.1)
Basophils Relative: 0 %
EOS ABS: 0 10*3/uL (ref 0.0–0.7)
EOS PCT: 0 %
HCT: 34.7 % — ABNORMAL LOW (ref 36.0–46.0)
HEMOGLOBIN: 11.4 g/dL — AB (ref 12.0–15.0)
LYMPHS ABS: 1 10*3/uL (ref 0.7–4.0)
Lymphocytes Relative: 7 %
MCH: 28.8 pg (ref 26.0–34.0)
MCHC: 32.9 g/dL (ref 30.0–36.0)
MCV: 87.6 fL (ref 78.0–100.0)
MONO ABS: 1 10*3/uL (ref 0.1–1.0)
MONOS PCT: 7 %
Neutro Abs: 13.4 10*3/uL — ABNORMAL HIGH (ref 1.7–7.7)
Neutrophils Relative %: 86 %
PLATELETS: 176 10*3/uL (ref 150–400)
RBC: 3.96 MIL/uL (ref 3.87–5.11)
RDW: 14.3 % (ref 11.5–15.5)
WBC: 15.5 10*3/uL — ABNORMAL HIGH (ref 4.0–10.5)

## 2017-06-15 LAB — CBC
HEMATOCRIT: 30.4 % — AB (ref 36.0–46.0)
Hemoglobin: 10 g/dL — ABNORMAL LOW (ref 12.0–15.0)
MCH: 28.5 pg (ref 26.0–34.0)
MCHC: 32.9 g/dL (ref 30.0–36.0)
MCV: 86.6 fL (ref 78.0–100.0)
PLATELETS: 170 10*3/uL (ref 150–400)
RBC: 3.51 MIL/uL — ABNORMAL LOW (ref 3.87–5.11)
RDW: 14.3 % (ref 11.5–15.5)
WBC: 11.2 10*3/uL — AB (ref 4.0–10.5)

## 2017-06-15 LAB — COMPREHENSIVE METABOLIC PANEL
ALT: 17 U/L (ref 14–54)
AST: 29 U/L (ref 15–41)
Albumin: 3 g/dL — ABNORMAL LOW (ref 3.5–5.0)
Alkaline Phosphatase: 102 U/L (ref 38–126)
Anion gap: 19 — ABNORMAL HIGH (ref 5–15)
BUN: 54 mg/dL — AB (ref 6–20)
CHLORIDE: 101 mmol/L (ref 101–111)
CO2: 15 mmol/L — AB (ref 22–32)
CREATININE: 3 mg/dL — AB (ref 0.44–1.00)
Calcium: 9.4 mg/dL (ref 8.9–10.3)
GFR calc Af Amer: 16 mL/min — ABNORMAL LOW (ref >60)
GFR calc non Af Amer: 14 mL/min — ABNORMAL LOW (ref >60)
GLUCOSE: 310 mg/dL — AB (ref 65–99)
Potassium: 3.9 mmol/L (ref 3.5–5.1)
SODIUM: 135 mmol/L (ref 135–145)
Total Bilirubin: 0.5 mg/dL (ref 0.3–1.2)
Total Protein: 6.3 g/dL — ABNORMAL LOW (ref 6.5–8.1)

## 2017-06-15 LAB — CBG MONITORING, ED: GLUCOSE-CAPILLARY: 214 mg/dL — AB (ref 65–99)

## 2017-06-15 LAB — PROTIME-INR
INR: 1.18
Prothrombin Time: 15.1 seconds (ref 11.4–15.2)

## 2017-06-15 LAB — BLOOD GAS, ARTERIAL
Acid-base deficit: 8.6 mmol/L — ABNORMAL HIGH (ref 0.0–2.0)
Bicarbonate: 15.7 mmol/L — ABNORMAL LOW (ref 20.0–28.0)
Drawn by: 270211
O2 CONTENT: 3 L/min
O2 SAT: 90.9 %
PATIENT TEMPERATURE: 98.6
pCO2 arterial: 29.7 mmHg — ABNORMAL LOW (ref 32.0–48.0)
pH, Arterial: 7.342 — ABNORMAL LOW (ref 7.350–7.450)
pO2, Arterial: 67.9 mmHg — ABNORMAL LOW (ref 83.0–108.0)

## 2017-06-15 LAB — BASIC METABOLIC PANEL
ANION GAP: 11 (ref 5–15)
BUN: 54 mg/dL — AB (ref 6–20)
CO2: 18 mmol/L — AB (ref 22–32)
Calcium: 8.6 mg/dL — ABNORMAL LOW (ref 8.9–10.3)
Chloride: 108 mmol/L (ref 101–111)
Creatinine, Ser: 2.3 mg/dL — ABNORMAL HIGH (ref 0.44–1.00)
GFR calc Af Amer: 23 mL/min — ABNORMAL LOW (ref >60)
GFR, EST NON AFRICAN AMERICAN: 19 mL/min — AB (ref >60)
GLUCOSE: 220 mg/dL — AB (ref 65–99)
POTASSIUM: 3.8 mmol/L (ref 3.5–5.1)
Sodium: 137 mmol/L (ref 135–145)

## 2017-06-15 LAB — COOXEMETRY PANEL
Carboxyhemoglobin: 0.9 % (ref 0.5–1.5)
Methemoglobin: 0.9 % (ref 0.0–1.5)
O2 Saturation: 52.5 %
TOTAL HEMOGLOBIN: 10.2 g/dL — AB (ref 12.0–16.0)

## 2017-06-15 LAB — APTT: APTT: 33 s (ref 24–36)

## 2017-06-15 LAB — TYPE AND SCREEN
ABO/RH(D): B NEG
Antibody Screen: NEGATIVE

## 2017-06-15 LAB — GLUCOSE, CAPILLARY
GLUCOSE-CAPILLARY: 178 mg/dL — AB (ref 65–99)
Glucose-Capillary: 199 mg/dL — ABNORMAL HIGH (ref 65–99)

## 2017-06-15 LAB — I-STAT CG4 LACTIC ACID, ED
Lactic Acid, Venous: 7.37 mmol/L (ref 0.5–1.9)
Lactic Acid, Venous: 3.5 mmol/L (ref 0.5–1.9)

## 2017-06-15 LAB — ABO/RH: ABO/RH(D): B NEG

## 2017-06-15 LAB — CREATININE, SERUM
Creatinine, Ser: 2.37 mg/dL — ABNORMAL HIGH (ref 0.44–1.00)
GFR, EST AFRICAN AMERICAN: 22 mL/min — AB (ref >60)
GFR, EST NON AFRICAN AMERICAN: 19 mL/min — AB (ref >60)

## 2017-06-15 LAB — LACTIC ACID, PLASMA
LACTIC ACID, VENOUS: 3.1 mmol/L — AB (ref 0.5–1.9)
LACTIC ACID, VENOUS: 2.8 mmol/L — AB (ref 0.5–1.9)

## 2017-06-15 LAB — BRAIN NATRIURETIC PEPTIDE: B Natriuretic Peptide: 418.7 pg/mL — ABNORMAL HIGH (ref 0.0–100.0)

## 2017-06-15 LAB — MRSA PCR SCREENING: MRSA BY PCR: POSITIVE — AB

## 2017-06-15 LAB — CORTISOL: Cortisol, Plasma: >100 ug/dL

## 2017-06-15 LAB — TROPONIN I
Troponin I: 0.06 ng/mL (ref <0.03)
Troponin I: 0.08 ng/mL (ref <0.03)

## 2017-06-15 LAB — PROCALCITONIN: PROCALCITONIN: 7.36 ng/mL

## 2017-06-15 MED ORDER — LACTATED RINGERS IV BOLUS (SEPSIS)
1000.0000 mL | Freq: Once | INTRAVENOUS | Status: AC
  Administered 2017-06-15: 1000 mL via INTRAVENOUS

## 2017-06-15 MED ORDER — SODIUM CHLORIDE 0.9 % IV SOLN
500.0000 mg | Freq: Two times a day (BID) | INTRAVENOUS | Status: DC
  Administered 2017-06-15 – 2017-06-16 (×2): 500 mg via INTRAVENOUS
  Filled 2017-06-15 (×2): qty 0.5

## 2017-06-15 MED ORDER — SODIUM CHLORIDE 0.9 % IV SOLN
1.0000 g | Freq: Two times a day (BID) | INTRAVENOUS | Status: DC
  Filled 2017-06-15: qty 1

## 2017-06-15 MED ORDER — SODIUM CHLORIDE 0.9 % IV BOLUS (SEPSIS): 1000.0000 mL | Freq: Once | INTRAVENOUS | Status: DC

## 2017-06-15 MED ORDER — VANCOMYCIN HCL IN DEXTROSE 1-5 GM/200ML-% IV SOLN: 1000.0000 mg | INTRAVENOUS | Status: DC

## 2017-06-15 MED ORDER — DEXTROSE 5 % IV SOLN
5.0000 ug/min | INTRAVENOUS | Status: DC
  Administered 2017-06-15: 5 ug/min via INTRAVENOUS
  Administered 2017-06-16: 1 ug/min via INTRAVENOUS
  Filled 2017-06-15 (×2): qty 4

## 2017-06-15 MED ORDER — SODIUM CHLORIDE 0.9 % IV SOLN: 250.0000 mL | INTRAVENOUS | Status: DC | PRN

## 2017-06-15 MED ORDER — SODIUM CHLORIDE 0.9 % IV SOLN
INTRAVENOUS | Status: DC
  Administered 2017-06-15 – 2017-06-17 (×3): via INTRAVENOUS

## 2017-06-15 MED ORDER — CITALOPRAM HYDROBROMIDE 20 MG PO TABS
20.0000 mg | ORAL_TABLET | Freq: Every day | ORAL | Status: DC
  Administered 2017-06-15 – 2017-06-18 (×4): 20 mg via ORAL
  Filled 2017-06-15: qty 1
  Filled 2017-06-15 (×3): qty 2

## 2017-06-15 MED ORDER — VANCOMYCIN HCL IN DEXTROSE 1-5 GM/200ML-% IV SOLN
1000.0000 mg | Freq: Once | INTRAVENOUS | Status: AC
  Administered 2017-06-15: 1000 mg via INTRAVENOUS
  Filled 2017-06-15: qty 200

## 2017-06-15 MED ORDER — SODIUM CHLORIDE 0.9 % IV BOLUS (SEPSIS)
1000.0000 mL | Freq: Once | INTRAVENOUS | Status: AC
  Administered 2017-06-15: 1000 mL via INTRAVENOUS

## 2017-06-15 MED ORDER — SODIUM CHLORIDE 0.9 % IV SOLN
1.0000 g | Freq: Every day | INTRAVENOUS | Status: DC
  Administered 2017-06-15: 1 g via INTRAVENOUS
  Filled 2017-06-15: qty 1

## 2017-06-15 MED ORDER — INSULIN ASPART 100 UNIT/ML ~~LOC~~ SOLN
0.0000 [IU] | SUBCUTANEOUS | Status: DC
  Administered 2017-06-15 – 2017-06-16 (×3): 3 [IU] via SUBCUTANEOUS
  Administered 2017-06-16: 2 [IU] via SUBCUTANEOUS
  Administered 2017-06-16: 3 [IU] via SUBCUTANEOUS
  Administered 2017-06-18 – 2017-06-19 (×4): 2 [IU] via SUBCUTANEOUS
  Administered 2017-06-19 (×2): 3 [IU] via SUBCUTANEOUS
  Administered 2017-06-19 – 2017-06-21 (×8): 2 [IU] via SUBCUTANEOUS
  Administered 2017-06-21: 3 [IU] via SUBCUTANEOUS
  Administered 2017-06-21: 2 [IU] via SUBCUTANEOUS
  Administered 2017-06-22: 3 [IU] via SUBCUTANEOUS
  Administered 2017-06-22 (×2): 2 [IU] via SUBCUTANEOUS
  Administered 2017-06-23 (×3): 3 [IU] via SUBCUTANEOUS
  Administered 2017-06-24 (×2): 2 [IU] via SUBCUTANEOUS
  Administered 2017-06-25: 3 [IU] via SUBCUTANEOUS
  Administered 2017-06-25 (×2): 8 [IU] via SUBCUTANEOUS
  Administered 2017-06-26 (×2): 5 [IU] via SUBCUTANEOUS
  Administered 2017-06-26: 3 [IU] via SUBCUTANEOUS

## 2017-06-15 MED ORDER — HEPARIN SODIUM (PORCINE) 5000 UNIT/ML IJ SOLN
5000.0000 [IU] | Freq: Three times a day (TID) | INTRAMUSCULAR | Status: DC
  Administered 2017-06-15 – 2017-06-18 (×8): 5000 [IU] via SUBCUTANEOUS
  Filled 2017-06-15 (×8): qty 1

## 2017-06-15 MED ORDER — ASPIRIN 300 MG RE SUPP
150.0000 mg | Freq: Every day | RECTAL | Status: DC
  Administered 2017-06-15: 150 mg via RECTAL
  Filled 2017-06-15 (×2): qty 1

## 2017-06-15 MED ORDER — SODIUM CHLORIDE 0.9 % IV SOLN
500.0000 mL | INTRAVENOUS | Status: DC | PRN
  Administered 2017-06-16 (×2): 500 mL via INTRAVENOUS

## 2017-06-15 NOTE — Progress Notes (Addendum)
Pharmacy Antibiotic Note  Christy [name] is a 76 y.o. female admitted on 06/15/2017 with altered mental status, UTI.  She was recently treated for ESBL UTI infection & is currently on Bactrim per nursing home [facility].    Pharmacy has been consulted for Vancomycin & Meropenem dosing.  06/15/2017:   Afebrile  Mild leukocytosis- improving.  Procalcitonin & LA are elevated.   Renal function improving with hydration.  Estimated CrCl~14ml/min.  Although baseline Scr ~0.6 in June 2018.  Plan:  Vancomycin 1gm IV q24h  Meropenem 500mg  IV q12h  Check Vancomycin trough at steady state  Monitor renal function and cx data   Height: 5\' 7"  (170.2 cm) Weight: 194 lb 0.1 oz (88 kg) IBW/kg (Calculated) : 61.6  Temp (24hrs), Avg:98.7 F (37.1 C), Min:98.4 F (36.9 C), Max:99 F (37.2 C)   Recent Labs Lab 06/15/17 1222 06/15/17 1233 06/15/17 1647 06/15/17 1708 06/15/17 1847  WBC 15.5*  --  11.2*  --   --   CREATININE 3.00*  --  2.37*  --  2.30*  LATICACIDVEN  --  7.37*  --  3.50*  --     Estimated Creatinine Clearance: 23.7 mL/min (A) (by C-G formula based on SCr of 2.3 mg/dL (H)).    No Known Allergies  Antimicrobials this admission: 8/14 Invanz x1 8/14 Vanc >>  8/14 Meropenem >>   Dose adjustments this admission:  Microbiology results: 8/14 BCx: sent 8/14 UCx: sent   6/26 UCx: ESBL Klebsiella -only sens imipenem 6/3 UCx: Enterococcus faecalis: S=Amp, Nirtrofurantoin, Vanc, I=Levofloxacin  Thank you for allowing pharmacy to be a part of this patient's care.  Biagio Borg 06/15/2017 7:22 PM

## 2017-06-15 NOTE — Progress Notes (Signed)
    [name] for Infectious Disease    Date of Admission:  06/15/2017      ID: Christy [name] is a 76 y.o. female with  Hx of ESBL UTI Active Problems:   Septic shock (HCC)     Assessment/Plan: Dr Alvino Chapel called for recs on antibiotics. Patient given ertapenem. Given her hx of ESBL, recommend to keep in meropenem while further data returns from micro. Will ask pharmacy to dose abtx  Christy [name] [facility] for Infectious Diseases Cell: [phone] Pager: [phone]  06/15/2017, 4:37 PM

## 2017-06-15 NOTE — ED Notes (Signed)
[name] CBG=[id number] 

## 2017-06-15 NOTE — ED Provider Notes (Addendum)
[facility] DEPT Provider Note   CSN: [id number] Arrival date & time: 06/15/17  1200     History   Chief Complaint Chief Complaint  Patient presents with  . Code Sepsis   Level 5 caveat due to language barrier. HPI Christy [name] is a 76 y.o. female.  HPI Brought in for mental status changes and possible sepsis. Has been treated for UTI. Has had ESBL organisms. Had been on Invanz a month and a half ago. Had new urine culture to come to the patient from 5 days ago. Had been on Cipro then Bactrim. Reportedly had decreased mental status and hypotension today. Unknown if she has had fevers. Pressures in the 70s for EMS. Reportedly had nausea and vomiting today also. Past Medical History:  Diagnosis Date  . Abnormal ECG    Inferior Q waves  . Cancer ([place])    stage II right breast cancer  . Cataract   . Diabetes mellitus   . Hypercholesterolemia   . Hyperlipidemia   . Hypertension   . Osteoporosis   . Overactive bladder     Patient Active Problem List   Diagnosis Date Noted  . Septic shock ([place]) 06/15/2017  . HCAP (healthcare-associated pneumonia) 05/05/2017  . Klebsiella pneumoniae infection 04/30/2017  . Nausea & vomiting   . UTI (urinary tract infection) 04/27/2017  . Constipation 04/27/2017  . Failure to thrive (0-17) 04/27/2017  . Hypokalemia 04/27/2017  . Moderate protein-calorie malnutrition ([place])   . Diabetes mellitus with complication ([place])   . OAB (overactive bladder) 04/22/2017  . S/P ORIF (open reduction internal fixation) fracture 04/22/2017  . Closed fracture of facial bone with routine healing 04/22/2017  . Anemia 04/22/2017  . Dehydration   . Displaced comminuted fracture of left patella, initial encounter for closed fracture 03/31/2017  . Elevated troponin   . Acute renal failure (ARF) ([place]) 03/29/2017  . Leukocytosis 03/29/2017  . Fall 03/29/2017  . Multiple fractures 03/28/2017  . Hyperlipidemia 12/27/2016  . Essential hypertension 12/27/2016   . Elevated serum creatinine 08/07/2014  . Elevated BUN 08/07/2014  . Diabetes ([place]) 08/07/2014  . Osteoporosis 08/07/2014  . Breast cancer of upper-outer quadrant of right female breast ([place]) 08/07/2013  . Abnormal EKG 08/21/2011    Past Surgical History:  Procedure Laterality Date  . BREAST SURGERY     right lumpectomy, sentinel node biopsy  . CATARACT EXTRACTION Bilateral 2008  . FRACTURE SURGERY     ORIF right distal radius fx  . JOINT REPLACEMENT     right hip  . ORIF RADIAL FRACTURE Bilateral 03/29/2017   Procedure: OPEN REDUCTION INTERNAL FIXATION (ORIF) RADIAL FRACTURES;  Surgeon: Iran Planas, MD;  Location: [facility];  Service: Orthopedics;  Laterality: Bilateral;  . PATELLECTOMY Left 03/31/2017   Procedure: LEFT PARTIAL PATELLECTOMY;  Surgeon: Leandrew Koyanagi, MD;  Location: [facility];  Service: Orthopedics;  Laterality: Left;  . PORT-A-CATH REMOVAL  09/11/2011   Procedure: REMOVAL PORT-A-CATH;  Surgeon: Rolm Bookbinder, MD;  Location: [facility];  Service: General;  Laterality: Left;  local   . PORTACATH PLACEMENT      OB History    No data available       Home Medications    Prior to Admission medications   Medication Sig Start Date End Date Taking? Authorizing Provider  acetaminophen (TYLENOL) 325 MG tablet Take 2 tablets (650 mg total) by mouth every 6 (six) hours as needed for mild pain. 04/05/17  Yes Verner Mould, MD  amLODipine ([place]) 10  MG tablet Take 1 tablet (10 mg total) by mouth daily. 04/05/17  Yes Verner Mould, MD  aspirin EC 81 MG tablet Take 81 mg by mouth daily.   Yes [provider]  atorvastatin (LIPITOR) 20 MG tablet Take 20 mg by mouth at bedtime.   Yes [provider]  bisacodyl (DULCOLAX) 10 MG suppository Place 1 suppository (10 mg total) rectally daily as needed for moderate constipation. 04/30/17  Yes Jani Gravel, MD  bisacodyl (DULCOLAX) 5 MG EC tablet Take 5 mg by mouth once.   Yes [provider]  Cholecalciferol (VITAMIN D) 2000 units tablet Take 2,000 Units by mouth daily.   Yes [provider]  citalopram (CELEXA) 20 MG tablet Take 1 tablet (20 mg total) by mouth daily. 04/06/17  Yes [name], [name], MD  docusate sodium (COLACE) 100 MG capsule Take 100 mg by mouth 2 (two) times daily. Hold for loose stool   Yes [provider]  furosemide (LASIX) 40 MG tablet Take 1 tablet (40 mg total) by mouth daily. 12/31/16  Yes [name], [name], MD  glimepiride (AMARYL) 2 MG tablet Take 1 tablet (2 mg total) by mouth daily before breakfast. 12/31/16  Yes [name], [name], MD  insulin detemir (LEVEMIR) 100 UNIT/ML injection Inject 0.26 mLs (26 Units total) into the skin at bedtime. 04/05/17  Yes [name], [name], MD  lidocaine (LIDODERM) 5 % Place 1 patch onto the skin every 12 (twelve) hours. Remove & Discard patch within 12 hours or as directed by MD   Yes [provider]  lisinopril (PRINIVIL,ZESTRIL) 40 MG tablet Take 1 tablet (40 mg total) by mouth daily. 12/31/16  Yes [name], [name], MD  magnesium hydroxide (MILK OF MAGNESIA) 400 MG/5ML suspension Take 20 mLs by mouth once as needed for mild constipation.   Yes [provider]  metFORMIN (GLUCOPHAGE-XR) 500 MG 24 hr tablet Take 2 tablets (1,000 mg total) by mouth 2 (two) times daily. 01/13/17  Yes [name], [name], MD  metoCLOPramide (REGLAN) 5 MG tablet Take 1 tablet (5 mg total) by mouth 3 (three) times daily before meals. 05/01/17  Yes Jani Gravel, MD  mirtazapine (REMERON) 15 MG tablet Take 7.5 mg by mouth at bedtime. 04/30/17  Yes [provider]  ondansetron (ZOFRAN) 4 MG tablet Take 4 mg by mouth every 6 (six) hours.    Yes [provider]  potassium chloride SA (K-DUR,KLOR-CON) 20 MEQ tablet Take 1 tablet (20 mEq total) by mouth daily. 04/30/17  Yes Jani Gravel, MD  promethazine (PHENERGAN) 25 MG/ML injection Inject 25 mg into the muscle once.   Yes [provider]    senna (SENOKOT) 8.6 MG TABS tablet Take 1 tablet (8.6 mg total) by mouth daily. 04/06/17  Yes [name], [name], MD  sulfamethoxazole-trimethoprim (BACTRIM DS,SEPTRA DS) 800-160 MG tablet Take 1 tablet by mouth 2 (two) times daily.   Yes [provider]  UNABLE TO [place] 2.0/Mes pass: Drink 90 ml's by mouth three times a day for poor meal intake   Yes [provider]  fenofibrate (TRICOR) 48 MG tablet Take 1 tablet (48 mg total) by mouth daily. 12/31/16   [name], Earna Coder, MD  solifenacin (VESICARE) 10 MG tablet Take 1 tablet (10 mg total) by mouth daily. Patient not taking: Reported on 06/15/2017 12/31/16   [name], MD    Family History Family History  Problem Relation Age of Onset  . Stroke Mother   . Stroke Father   . Hypertension Sister   .  Stroke Sister   . Stroke Brother     Social History Social History  Substance Use Topics  . Smoking status: Former Smoker    Types: Cigarettes    Quit date: 12/27/2012  . Smokeless tobacco: Never Used     Comment: quit 2009  . Alcohol use No     Allergies   Patient has no known allergies.   Review of Systems Review of Systems  Unable to perform ROS: Acuity of condition     Physical Exam Updated Vital Signs BP (!) 89/37   Pulse 87   Temp 99 F (37.2 C) (Rectal)   Resp 19   SpO2 99%   Physical Exam  Constitutional: She appears well-developed.  HENT:  Head: Atraumatic.  Eyes: Pupils are equal, round, and reactive to light.  Neck: Neck supple.  Cardiovascular: Normal rate.   Pulmonary/Chest: Effort normal. She has no rales.  Abdominal: There is no tenderness.  Musculoskeletal: She exhibits no edema.  Neurological: She is alert.  Alert and will squeeze hands to commands. There is however a language barrier.     ED Treatments / Results  Labs (all labs ordered are listed, but only abnormal results are displayed) Labs Reviewed  COMPREHENSIVE METABOLIC PANEL - Abnormal; Notable for the  following:       Result Value   CO2 15 (*)    Glucose, Bld 310 (*)    BUN 54 (*)    Creatinine, Ser 3.00 (*)    Total Protein 6.3 (*)    Albumin 3.0 (*)    GFR calc non Af Amer 14 (*)    GFR calc Af Amer 16 (*)    Anion gap 19 (*)    All other components within normal limits  CBC WITH DIFFERENTIAL/PLATELET - Abnormal; Notable for the following:    WBC 15.5 (*)    Hemoglobin 11.4 (*)    HCT 34.7 (*)    Neutro Abs 13.4 (*)    All other components within normal limits  URINALYSIS, ROUTINE W REFLEX MICROSCOPIC - Abnormal; Notable for the following:    Color, Urine RED (*)    APPearance CLOUDY (*)    Hgb urine dipstick SMALL (*)    Leukocytes, UA TRACE (*)    Bacteria, UA FEW (*)    Squamous Epithelial / LPF 0-5 (*)    All other components within normal limits  I-STAT CG4 LACTIC ACID, ED - Abnormal; Notable for the following:    Lactic Acid, Venous 7.37 (*)    All other components within normal limits  CULTURE, BLOOD (ROUTINE X 2)  CULTURE, BLOOD (ROUTINE X 2)  URINE CULTURE  URINE CULTURE  CORTISOL  TROPONIN I  PROTIME-INR  PROCALCITONIN  APTT  CBC  CREATININE, SERUM  BASIC METABOLIC PANEL  TROPONIN I  TROPONIN I  BRAIN NATRIURETIC PEPTIDE  BLOOD GAS, ARTERIAL  I-STAT CG4 LACTIC ACID, ED  TYPE AND SCREEN    EKG  EKG Interpretation  Date/Time:  Tuesday June 15 2017 12:30:30 EDT Ventricular Rate:  88 PR Interval:    QRS Duration: 161 QT Interval:  423 QTC Calculation: 512 R Axis:   -16 Text Interpretation:  Sinus rhythm Borderline prolonged PR interval Left bundle branch block Confirmed by Davonna Belling [phone]) on 06/15/2017 12:40:23 PM       Radiology Dg Chest 2 View  Result Date: 06/15/2017 CLINICAL DATA:  Recently treated urinary tract infection, hypotension, possible mental status change. History of breast malignancy. EXAM: CHEST  2 VIEW  COMPARISON:  Chest x-ray of April 27, 2017 FINDINGS: There is mild hypoinflation of the right lung. The  interstitial markings of both lungs are coarse. There is density at the left lung base likely in the lower lobe which appears new. The cardiac silhouette is enlarged. The pulmonary vascularity is not clearly engorged. There is tortuosity of the descending thoracic aorta with calcification in the wall. There are chronic changes of the right shoulder. IMPRESSION: Findings worrisome for atelectasis or pneumonia in the left lower lobe. Followup PA and lateral chest X-ray is recommended in 3-4 weeks following trial of antibiotic therapy to ensure resolution and exclude underlying malignancy. Stable cardiomegaly without pulmonary vascular congestion or pulmonary edema. Thoracic aortic atherosclerosis. Electronically Signed   By: David  Martinique M.D.   On: 06/15/2017 13:17    Procedures Procedures (including critical care time)  Medications Ordered in ED Medications  ertapenem (INVANZ) 1 g in sodium chloride 0.9 % 50 mL IVPB (0 g Intravenous Stopped 06/15/17 1514)  citalopram (CELEXA) tablet 20 mg (not administered)  0.9 %  sodium chloride infusion (not administered)  0.9 %  sodium chloride infusion (not administered)  norepinephrine (LEVOPHED) 4 mg in dextrose 5 % 250 mL (0.016 mg/mL) infusion (not administered)  0.9 %  sodium chloride infusion (not administered)  insulin aspart (novoLOG) injection 0-15 Units (not administered)  heparin injection 5,000 Units (not administered)  lactated ringers bolus 1,000 mL (not administered)  sodium chloride 0.9 % bolus 1,000 mL (0 mLs Intravenous Stopped 06/15/17 1344)  sodium chloride 0.9 % bolus 1,000 mL (0 mLs Intravenous Stopped 06/15/17 1514)  sodium chloride 0.9 % bolus 1,000 mL (0 mLs Intravenous Stopped 06/15/17 1346)     Initial Impression / Assessment and Plan / ED Course  I have reviewed the triage vital signs and the nursing notes.  Pertinent labs & imaging results that were available during my care of the patient were reviewed by me and considered in  my medical decision making (see chart for details).     Patient with likely septic shock versus dehydration and renal sufficiency. Has had recent urinary tract infections and has been on Bactrim and Cipro. Sent in for mental status changes and hypotension. Not febrile here but white count is elevated. Lactic acid is also elevated. This could be due to dehydration with her nausea and vomiting however. Creatinine could be either due to the hypotension, generalized sickness, or the medicine she's been on. Blood pressures improved somewhat with IV fluids. Code sepsis called once lactic acid was elevated. Started on Invanz due to previous susceptibilities. Culture sent and will be consult by infectious disease. Will discuss with critical care but possible admission  CRITICAL CARE Performed by: Mackie Pai Total critical care time: 30 minutes Critical care time was exclusive of separately billable procedures and treating other patients. Critical care was necessary to treat or prevent imminent or life-threatening deterioration. Critical care was time spent personally by me on the following activities: development of treatment plan with patient and/or surrogate as well as nursing, discussions with consultants, evaluation of patient's response to treatment, examination of patient, obtaining history from patient or surrogate, ordering and performing treatments and interventions, ordering and review of laboratory studies, ordering and review of radiographic studies, pulse oximetry and re-evaluation of patient's condition.   Final Clinical Impressions(s) / ED Diagnoses   Final diagnoses:  Septic shock ([place])  Acute kidney injury ([place])    New Prescriptions New Prescriptions   No medications on file  Davonna Belling, MD 06/15/17 1347    Davonna Belling, MD 06/15/17 [phone]

## 2017-06-15 NOTE — ED Notes (Signed)
Labs and ABG delayed due to insertion of central line.

## 2017-06-15 NOTE — Procedures (Signed)
Central Venous Catheter Insertion Procedure Note Christy [name] [id number] December 16, 1940  Procedure: Insertion of Central Venous Catheter Indications: Assessment of intravascular volume, Drug and/or fluid administration and Frequent blood sampling  Procedure Details Consent: Risks of procedure as well as the alternatives and risks of each were explained to the (patient/caregiver).  Consent for procedure obtained. Time Out: Verified patient identification, verified procedure, site/side was marked, verified correct patient position, special equipment/implants available, medications/allergies/relevent history reviewed, required imaging and test results available.  Performed Real time [place] was used to ID and cannulate vessel   Maximum sterile technique was used including antiseptics, cap, gloves, gown, hand hygiene, mask and sheet. Skin prep: Chlorhexidine; local anesthetic administered A antimicrobial bonded/coated triple lumen catheter was placed in the right internal jugular vein using the Seldinger technique.  Evaluation Blood flow good Complications: No apparent complications Patient did tolerate procedure well. Chest X-ray ordered to verify placement.  CXR: pending.  Christy [name] 06/15/2017, 4:26 PM [name] ACNP-BC [name] Pager # [phone] OR # [phone] if no answer

## 2017-06-15 NOTE — ED Notes (Signed)
ICU notified that transfer delay due to patient getting [place], needing port CXR for lfine placement, and central line being placed earlier.

## 2017-06-15 NOTE — ED Triage Notes (Signed)
Per EMS: Pt from [facility].  Staff there states that today, pt had a baseline change.  Pt is not talking but cannot be ruled out that this is from a language deficit.  Pt is from [place].  Pt alert. [name] BP en route 70/40.  Pt just finished a course of abx for a urinary tract infection.

## 2017-06-15 NOTE — H&P (Signed)
PULMONARY / CRITICAL CARE MEDICINE   Name: Christy [name] MRN: [id number] DOB: October 30, 1941    ADMISSION DATE:  06/15/2017 CONSULTATION DATE:  8/14  REFERRING MD:  Alvino Chapel   CHIEF COMPLAINT:  Septic shock  HISTORY OF PRESENT ILLNESS:   76 yo [facility] patient (resides there after knee surgery and deconditioning that followed since June 2018). Has h/u ESBL producer K PNA UTI. Was in [place] until about 10d ago. Got weak, c/o RLQ discomfort. Treated for UTI which cultures again grew out ESBL K PNA. She was treated w/ bactrim at that time IV for 2 d then converted to PO. Initially felt better. Then was found 8/14 by nursing staff w/ decreased LOC, hypotensive w/ SBP in 60-70s. EMS called. Sepsis protocol initiated. LA > 7. Was given 30 ml/kg fluid challenge by ER team, remained hypotensive but MS improved some. Cultures were obtained, abx started. PCCM asked to admit.   PAST MEDICAL HISTORY :  She  has a past medical history of Abnormal ECG; Cancer ([place]); Cataract; Diabetes mellitus; Hypercholesterolemia; Hyperlipidemia; Hypertension; Osteoporosis; and Overactive bladder.  PAST SURGICAL HISTORY: She  has a past surgical history that includes Breast surgery; Portacath placement; Joint replacement; Fracture surgery; Port-a-cath removal (09/11/2011); Cataract extraction (Bilateral, 2008); Patellectomy (Left, 03/31/2017); and ORIF radial fracture (Bilateral, 03/29/2017).  No Known Allergies  No current facility-administered medications on file prior to encounter.    Current Outpatient Prescriptions on File Prior to Encounter  Medication Sig  . acetaminophen (TYLENOL) 325 MG tablet Take 2 tablets (650 mg total) by mouth every 6 (six) hours as needed for mild pain.  [name] amLODipine (NORVASC) 10 MG tablet Take 1 tablet (10 mg total) by mouth daily.  [name] aspirin EC 81 MG tablet Take 81 mg by mouth daily.  [name] atorvastatin (LIPITOR) 20 MG tablet Take 20 mg by mouth at bedtime.  . bisacodyl (DULCOLAX) 10 MG suppository  Place 1 suppository (10 mg total) rectally daily as needed for moderate constipation.  . Cholecalciferol (VITAMIN D) 2000 units tablet Take 2,000 Units by mouth daily.  . citalopram (CELEXA) 20 MG tablet Take 1 tablet (20 mg total) by mouth daily.  [name] docusate sodium (COLACE) 100 MG capsule Take 100 mg by mouth 2 (two) times daily. Hold for loose stool  . furosemide (LASIX) 40 MG tablet Take 1 tablet (40 mg total) by mouth daily.  [name] glimepiride (AMARYL) 2 MG tablet Take 1 tablet (2 mg total) by mouth daily before breakfast.  . insulin detemir (LEVEMIR) 100 UNIT/ML injection Inject 0.26 mLs (26 Units total) into the skin at bedtime.  [name] lisinopril (PRINIVIL,ZESTRIL) 40 MG tablet Take 1 tablet (40 mg total) by mouth daily.  . metFORMIN (GLUCOPHAGE-XR) 500 MG 24 hr tablet Take 2 tablets (1,000 mg total) by mouth 2 (two) times daily.  . metoCLOPramide (REGLAN) 5 MG tablet Take 1 tablet (5 mg total) by mouth 3 (three) times daily before meals.  . ondansetron (ZOFRAN) 4 MG tablet Take 4 mg by mouth every 6 (six) hours.   . potassium chloride SA (K-DUR,KLOR-CON) 20 MEQ tablet Take 1 tablet (20 mEq total) by mouth daily.  [name] senna (SENOKOT) 8.6 MG TABS tablet Take 1 tablet (8.6 mg total) by mouth daily.  [name] UNABLE TO FIND House 2.0/Mes pass: Drink 90 ml's by mouth three times a day for poor meal intake  . fenofibrate (TRICOR) 48 MG tablet Take 1 tablet (48 mg total) by mouth daily.  . solifenacin (VESICARE) 10 MG tablet Take 1 tablet (10  mg total) by mouth daily. (Patient not taking: Reported on 06/15/2017)    FAMILY HISTORY:  Her indicated that her mother is deceased. She indicated that her father is deceased. She indicated that her sister is deceased. She indicated that both of her brothers are deceased.    SOCIAL HISTORY: She  reports that she quit smoking about 4 years ago. Her smoking use included Cigarettes. She has never used smokeless tobacco. She reports that she does not drink alcohol or use  drugs.  REVIEW OF SYSTEMS:   Unable  SUBJECTIVE:  A little more awake  VITAL SIGNS: BP (!) 89/37   Pulse 87   Temp 99 F (37.2 C) (Rectal)   Resp 19   SpO2 99%   HEMODYNAMICS:    VENTILATOR SETTINGS:    INTAKE / OUTPUT: No intake/output data recorded.  PHYSICAL EXAMINATION: General appearance:  76 Year old  [place] speaking female, well nourished, conversant. Not in acute distress but does c/o pain Eyes: anicteric sclerae, moist conjunctivae; PERRL, EOMI bilaterally. Mouth:  membranes and no mucosal ulcerations; normal hard and soft palate Neck: Trachea midline; neck supple, no JVD Lungs/chest: Crackles both bases. with normal respiratory effort and no intercostal retractions CV: RRR, no MRGs  Abdomen: Soft, tender to palp; no masses or HSM Extremities: No peripheral edema or extremity lymphadenopathy. The left knee has a surgical scar that appears well healed  Skin: warm temperature, turgor and texture; no rash, ulcers or subcutaneous nodules Psych: anxious affect, alert, follows commands.   LABS:  BMET  Recent Labs Lab 06/15/17 1222  NA 135  K 3.9  CL 101  CO2 15*  BUN 54*  CREATININE 3.00*  GLUCOSE 310*    Electrolytes  Recent Labs Lab 06/15/17 1222  CALCIUM 9.4    CBC  Recent Labs Lab 06/15/17 1222  WBC 15.5*  HGB 11.4*  HCT 34.7*  PLT 176    Coag's No results for input(s): APTT, INR in the last 168 hours.  Sepsis Markers  Recent Labs Lab 06/15/17 1233  LATICACIDVEN 7.37*    ABG No results for input(s): PHART, PCO2ART, PO2ART in the last 168 hours.  Liver Enzymes  Recent Labs Lab 06/15/17 1222  AST 29  ALT 17  ALKPHOS 102  BILITOT 0.5  ALBUMIN 3.0*    Cardiac Enzymes No results for input(s): TROPONINI, PROBNP in the last 168 hours.  Glucose No results for input(s): GLUCAP in the last 168 hours.  Imaging Dg Chest 2 View  Result Date: 06/15/2017 CLINICAL DATA:  Recently treated urinary tract infection,  hypotension, possible mental status change. History of breast malignancy. EXAM: CHEST  2 VIEW COMPARISON:  Chest x-ray of April 27, 2017 FINDINGS: There is mild hypoinflation of the right lung. The interstitial markings of both lungs are coarse. There is density at the left lung base likely in the lower lobe which appears new. The cardiac silhouette is enlarged. The pulmonary vascularity is not clearly engorged. There is tortuosity of the descending thoracic aorta with calcification in the wall. There are chronic changes of the right shoulder. IMPRESSION: Findings worrisome for atelectasis or pneumonia in the left lower lobe. Followup PA and lateral chest X-ray is recommended in 3-4 weeks following trial of antibiotic therapy to ensure resolution and exclude underlying malignancy. Stable cardiomegaly without pulmonary vascular congestion or pulmonary edema. Thoracic aortic atherosclerosis. Electronically Signed   By: David  Martinique M.D.   On: 06/15/2017 13:17     STUDIES:  Renal US 8/14>>>  CULTURES: [place]  2 8/14>>> UC 8/14>>>  ANTIBIOTICS: [place] 8/14>>> vanc 8/14>>>  SIGNIFICANT EVENTS:   LINES/TUBES:   DISCUSSION: Septic shock. Presume UT source. Has h/o K PNA ESBL producer which was last isolated in the SNF 8/6  ASSESSMENT / PLAN:  PULMONARY A: LLL Atelectasis vs HCAP cxr personally reviewed. LLL airspace disease  P:   o2 as indicated pulm hygiene  PCT algo HCAP coverage Repeat CXR in am   CARDIOVASCULAR A:  Septic shock H/o HTN  P:  Holding antihypertensives Cycle CEs Sepsis protocol -will need CVL, pressors for MAP > 65 -volume goal CVP 8-12 See ID section   RENAL A:   AKI Lactic acidosis  P:   IVF resuscitation  Hold ace-I, diuretics and other antihypertensives Renal dose meds Get abd [place] r/o obstructive uropathy  GASTROINTESTINAL A:   Abd discomfort  H/o constipation  n/v P:   Ck lipase and amylase NPO except meds Port abd and abd [place] to eval    HEMATOLOGIC A:   Anemia of chronic disease P:  Serial CBC [place] heparin for DVT prophylaxis  Transfuse per protocol   INFECTIOUS A:   Septic shock. ->favor UT source w/ recent MDR ESBL K PNA from Urine culture on 8/6. Was treated w/ bactrim which has some sensitivity. Also consider recent left knee surg or HCAP (but no symptoms)  P:   invanz 8/14>>> Add vanc 8/14>>> BCX2>>> Repeat UC>>> PCT algo  See CV section   ENDOCRINE A:   DM w/ hyperglycemia  P:   ssi and hold orals   NEUROLOGIC/Muscular skeletal  A:   Acute encephalopathy c/b primary language barrier  Recent left knee surgery. (6 weeks ago) P:   Hold sedating meds Supportive care  PRN APAP for now if has pain   FAMILY  - Updates:   - Inter-disciplinary family meet or Palliative Care meeting due by:  8/20  My cct 20 min [name] ACNP-BC [place] Pager # [phone] OR # [phone] if no answer   06/15/2017, 3:21 PM

## 2017-06-15 NOTE — Progress Notes (Addendum)
[facility] Progress Note Patient Name: Christy [name] DOB: 06-Nov-1940 MRN: [id number]   Date of Service  06/15/2017  HPI/Events of Note  Troponin = 0.06 - Demand ischemia?   eICU Interventions  Will order: 1. ASA 150 mg Suppository now and Q day.  2. Continue to trend Troponin.     Intervention Category Intermediate Interventions: Diagnostic test evaluation  Tyleah Loh Cornelia Copa 06/15/2017, 6:10 PM

## 2017-06-15 NOTE — ED Notes (Signed)
Critical Istat result given to EDP Alvino Chapel and RN

## 2017-06-15 NOTE — ED Notes (Signed)
Bed: WA17 Expected date:  Expected time:  Means of arrival:  Comments: Code sepsis

## 2017-06-16 ENCOUNTER — Inpatient Hospital Stay (HOSPITAL_COMMUNITY): Payer: Medicare Other

## 2017-06-16 DIAGNOSIS — I469 Cardiac arrest, cause unspecified: Secondary | ICD-10-CM

## 2017-06-16 DIAGNOSIS — L8990 Pressure ulcer of unspecified site, unspecified stage: Secondary | ICD-10-CM | POA: Insufficient documentation

## 2017-06-16 LAB — CBC
HEMATOCRIT: 29.2 % — AB (ref 36.0–46.0)
Hemoglobin: 10 g/dL — ABNORMAL LOW (ref 12.0–15.0)
MCH: 29.8 pg (ref 26.0–34.0)
MCHC: 34.2 g/dL (ref 30.0–36.0)
MCV: 86.9 fL (ref 78.0–100.0)
PLATELETS: 179 10*3/uL (ref 150–400)
RBC: 3.36 MIL/uL — ABNORMAL LOW (ref 3.87–5.11)
RDW: 14.3 % (ref 11.5–15.5)
WBC: 7.4 10*3/uL (ref 4.0–10.5)

## 2017-06-16 LAB — BASIC METABOLIC PANEL
Anion gap: 10 (ref 5–15)
BUN: 48 mg/dL — ABNORMAL HIGH (ref 6–20)
CALCIUM: 8.3 mg/dL — AB (ref 8.9–10.3)
CO2: 19 mmol/L — ABNORMAL LOW (ref 22–32)
CREATININE: 1.62 mg/dL — AB (ref 0.44–1.00)
Chloride: 108 mmol/L (ref 101–111)
GFR calc non Af Amer: 30 mL/min — ABNORMAL LOW (ref >60)
GFR, EST AFRICAN AMERICAN: 34 mL/min — AB (ref >60)
Glucose, Bld: 159 mg/dL — ABNORMAL HIGH (ref 65–99)
Potassium: 3.5 mmol/L (ref 3.5–5.1)
SODIUM: 137 mmol/L (ref 135–145)

## 2017-06-16 LAB — ECHOCARDIOGRAM COMPLETE
HEIGHTINCHES: 67 in
Weight: 3114.66 oz

## 2017-06-16 LAB — GLUCOSE, CAPILLARY
GLUCOSE-CAPILLARY: 166 mg/dL — AB (ref 65–99)
GLUCOSE-CAPILLARY: 86 mg/dL (ref 65–99)
GLUCOSE-CAPILLARY: 81 mg/dL (ref 65–99)
GLUCOSE-CAPILLARY: 93 mg/dL (ref 65–99)
Glucose-Capillary: 124 mg/dL — ABNORMAL HIGH (ref 65–99)
Glucose-Capillary: 119 mg/dL — ABNORMAL HIGH (ref 65–99)

## 2017-06-16 LAB — LACTIC ACID, PLASMA: Lactic Acid, Venous: 1.6 mmol/L (ref 0.5–1.9)

## 2017-06-16 LAB — MAGNESIUM: MAGNESIUM: 1.5 mg/dL — AB (ref 1.7–2.4)

## 2017-06-16 LAB — PHOSPHORUS: PHOSPHORUS: 3.8 mg/dL (ref 2.5–4.6)

## 2017-06-16 LAB — AMYLASE: AMYLASE: 85 U/L (ref 28–100)

## 2017-06-16 LAB — TROPONIN I
Troponin I: 0.1 ng/mL (ref <0.03)
Troponin I: 0.1 ng/mL (ref <0.03)

## 2017-06-16 LAB — PROCALCITONIN: Procalcitonin: 6.29 ng/mL

## 2017-06-16 LAB — LIPASE, BLOOD: LIPASE: 18 U/L (ref 11–51)

## 2017-06-16 MED ORDER — IOPAMIDOL (ISOVUE-300) INJECTION 61%: 100.0000 mL | Freq: Once | INTRAVENOUS | Status: DC | PRN

## 2017-06-16 MED ORDER — VANCOMYCIN HCL 10 G IV SOLR
1250.0000 mg | INTRAVENOUS | Status: DC
  Administered 2017-06-16: 1250 mg via INTRAVENOUS
  Filled 2017-06-16: qty 1250

## 2017-06-16 MED ORDER — IOPAMIDOL (ISOVUE-300) INJECTION 61%
INTRAVENOUS | Status: AC
  Administered 2017-06-16: 11:00:00
  Filled 2017-06-16: qty 30

## 2017-06-16 MED ORDER — MUPIROCIN 2 % EX OINT
1.0000 "application " | TOPICAL_OINTMENT | Freq: Two times a day (BID) | CUTANEOUS | Status: AC
  Administered 2017-06-17 – 2017-06-21 (×10): 1 via NASAL
  Filled 2017-06-16 (×3): qty 22

## 2017-06-16 MED ORDER — SODIUM CHLORIDE 0.9 % IV BOLUS (SEPSIS)
500.0000 mL | Freq: Once | INTRAVENOUS | Status: AC
  Administered 2017-06-16: 500 mL via INTRAVENOUS

## 2017-06-16 MED ORDER — CHLORHEXIDINE GLUCONATE CLOTH 2 % EX PADS
6.0000 | MEDICATED_PAD | Freq: Every day | CUTANEOUS | Status: AC
  Administered 2017-06-16 – 2017-06-20 (×5): 6 via TOPICAL

## 2017-06-16 MED ORDER — ORAL CARE MOUTH RINSE
15.0000 mL | Freq: Two times a day (BID) | OROMUCOSAL | Status: DC
  Administered 2017-06-17 – 2017-06-19 (×6): 15 mL via OROMUCOSAL

## 2017-06-16 MED ORDER — ONDANSETRON HCL 4 MG/2ML IJ SOLN
4.0000 mg | Freq: Four times a day (QID) | INTRAMUSCULAR | Status: DC | PRN
  Administered 2017-06-16 – 2017-06-25 (×6): 4 mg via INTRAVENOUS
  Filled 2017-06-16 (×6): qty 2

## 2017-06-16 MED ORDER — IOPAMIDOL (ISOVUE-300) INJECTION 61%
15.0000 mL | Freq: Two times a day (BID) | INTRAVENOUS | Status: DC | PRN
Start: 2017-06-16 — End: 2017-06-18

## 2017-06-16 MED ORDER — MEROPENEM 1 G IV SOLR
1.0000 g | Freq: Two times a day (BID) | INTRAVENOUS | Status: DC
  Administered 2017-06-16 – 2017-06-17 (×2): 1 g via INTRAVENOUS
  Filled 2017-06-16 (×2): qty 1

## 2017-06-16 NOTE — Progress Notes (Signed)
PULMONARY / CRITICAL CARE MEDICINE   Name: Christy [name] MRN: [id number] DOB: [birth year]    ADMISSION DATE:  06/15/2017 CONSULTATION DATE:  8/14  REFERRING MD:  Alvino Chapel   CHIEF COMPLAINT:  Septic shock  HISTORY OF PRESENT ILLNESS:   76 yo [facility] patient (resides there after knee surgery and deconditioning that followed since June 2018). Has h/u ESBL Klebsiella PNA UTI. Was in [place] until about 10d ago. Got weak, c/o RLQ discomfort. Treated for UTI which cultures again grew out ESBL K PNA. She was treated w/ bactrim at that time IV for 2 d then converted to PO. Initially felt better. Then was found 8/14 by nursing staff w/ decreased LOC, hypotensive w/ SBP in 60-70s. EMS called. Sepsis protocol initiated. LA > 7. Was given 30 ml/kg fluid challenge by ER team, remained hypotensive but MS improved some. Cultures were obtained, abx started. PCCM asked to admit.   SUBJECTIVE:  Remains on 51mcg/min levophed.  CVP 7 this morning. Continues to have moderate abdominal pain with guarding.  VITAL SIGNS: BP (!) 110/43 (BP Location: Right Arm)   Pulse 93   Temp 98.3 F (36.8 C) (Oral)   Resp (!) 26   Ht 5\' 7"  (1.702 m)   Wt 88.3 kg (194 lb 10.7 oz)   SpO2 91%   BMI 30.49 kg/m   HEMODYNAMICS: CVP:  [4 mmHg-10 mmHg] 7 mmHg  VENTILATOR SETTINGS:    INTAKE / OUTPUT: I/O last 3 completed shifts: In: 22 [I.V.:7846; IV [id number]:3244] Out: -   PHYSICAL EXAMINATION: General: Adult female, resting in bed, in NAD. Neuro: Non-english speaking but nods head appropriately to basic questions. No focal deficits. HEENT: [place]/AT. PERRL, sclerae anicteric. Cardiovascular: RRR, no M/R/G.  Lungs: Respirations even and unlabored.  CTA bilaterally, No W/R/R.  Abdomen: Obese.  BS hypoactive.  Soft.  Tender throughout but mainly RLQ. Musculoskeletal: No gross deformities, no edema.  Skin: Intact, warm, no rashes.   LABS:  BMET  Recent Labs Lab 06/15/17 1222 06/15/17 1647 06/15/17 1847  06/16/17 0358  NA 135  --  137 137  K 3.9  --  3.8 3.5  CL 101  --  108 108  CO2 15*  --  18* 19*  BUN 54*  --  54* 48*  CREATININE 3.00* 2.37* 2.30* 1.62*  GLUCOSE 310*  --  220* 159*    Electrolytes  Recent Labs Lab 06/15/17 1222 06/15/17 1847 06/16/17 0358  CALCIUM 9.4 8.6* 8.3*  MG  --   --  1.5*  PHOS  --   --  3.8    CBC  Recent Labs Lab 06/15/17 1222 06/15/17 1647 06/16/17 0358  WBC 15.5* 11.2* 7.4  HGB 11.4* 10.0* 10.0*  HCT 34.7* 30.4* 29.2*  PLT 176 170 179    Coag's  Recent Labs Lab 06/15/17 1647  APTT 33  INR 1.18    Sepsis Markers  Recent Labs Lab 06/15/17 1647  06/15/17 1847 06/15/17 2216 06/16/17 0358 06/16/17 0402  LATICACIDVEN  --   < > 3.1* 2.8*  --  1.6  PROCALCITON 7.36  --   --   --  6.29  --   < > = values in this interval not displayed.  ABG  Recent Labs Lab 06/15/17 1650  PHART 7.342*  PCO2ART 29.7*  PO2ART 67.9*    Liver Enzymes  Recent Labs Lab 06/15/17 1222  AST 29  ALT 17  ALKPHOS 102  BILITOT 0.5  ALBUMIN 3.0*    Cardiac Enzymes  Recent Labs  Lab 06/15/17 1647 06/15/17 2216 06/16/17 0358  TROPONINI 0.06* 0.08* 0.10*    Glucose  Recent Labs Lab 06/15/17 1655 06/15/17 1848 06/15/17 2327 06/16/17 0313 06/16/17 0742  GLUCAP 214* 199* 178* 166* 124*    Imaging Dg Chest 2 View  Result Date: 06/15/2017 CLINICAL DATA:  Recently treated urinary tract infection, hypotension, possible mental status change. History of breast malignancy. EXAM: CHEST  2 VIEW COMPARISON:  Chest x-ray of April 27, 2017 FINDINGS: There is mild hypoinflation of the right lung. The interstitial markings of both lungs are coarse. There is density at the left lung base likely in the lower lobe which appears new. The cardiac silhouette is enlarged. The pulmonary vascularity is not clearly engorged. There is tortuosity of the descending thoracic aorta with calcification in the wall. There are chronic changes of the right  shoulder. IMPRESSION: Findings worrisome for atelectasis or pneumonia in the left lower lobe. Followup PA and lateral chest X-ray is recommended in 3-4 weeks following trial of antibiotic therapy to ensure resolution and exclude underlying malignancy. Stable cardiomegaly without pulmonary vascular congestion or pulmonary edema. Thoracic aortic atherosclerosis. Electronically Signed   By: David  Martinique M.D.   On: 06/15/2017 13:17   US Abdomen Complete  Result Date: 06/15/2017 CLINICAL DATA:  Abdominal pain with nausea and vomiting EXAM: ABDOMEN ULTRASOUND COMPLETE COMPARISON:  Radiograph 04/29/2017, ultrasound 04/28/2017, CT 04/27/2017 FINDINGS: Gallbladder: Small echogenic foci in the gallbladder measuring up to 11 mm but without much shadowing, could represent small amount of sludge or small stones. Negative sonographic Murphy. Normal wall thickness. Common bile duct: Diameter: Slightly enlarged, measuring up to 1 cm, but similar compared with prior ultrasound from June. Liver: Increased echogenicity.  No focal abnormality is seen. IVC: Limited visualization.  No significant abnormality. Pancreas: Visualized portion unremarkable. Spleen: Size and appearance within normal limits. Right Kidney: Length: 12.9 cm. Echogenicity within normal limits. No mass or hydronephrosis visualized. Left Kidney: Length: 12 cm. Echogenicity within normal limits. No mass or hydronephrosis visualized. Abdominal aorta: Proximal aorta 2.4 cm. Mid to distal aorta obscured by bowel gas Other findings: None. IMPRESSION: 1. Small linear echogenic focus in the gallbladder could relate to a small amount of sludge or possible small stones. Normal wall thickness and negative sonographic Murphy. 2. Slightly enlarged common bowel duct up to 1 cm but without change since prior ultrasound from June 2018. Correlation with laboratory values previously suggested 3. Hepatic steatosis Electronically Signed   By: Donavan Foil M.D.   On: 06/15/2017  18:05   Dg Chest Port 1 View  Result Date: 06/15/2017 CLINICAL DATA:  Sepsis. EXAM: PORTABLE ABDOMEN - 1 VIEW; PORTABLE CHEST - 1 VIEW COMPARISON:  Chest x-ray 06/15/2017 and abdominal films 04/27/2017 FINDINGS: The portable semi upright chest film demonstrates normal heart size. Stable tortuosity and calcification of the thoracic aorta. Right IJ center venous catheter tip is in the distal SVC. Low lung volumes with vascular crowding and streaky basilar atelectasis. No definite effusions or infiltrates Two views of the abdomen demonstrate air filled colon with scattered stool down to the rectum. No findings for small bowel obstruction or free air. The soft tissue shadows are grossly maintained. Stable vascular calcifications. IMPRESSION: No acute cardiopulmonary findings. Low lung volumes with vascular crowding and streaky bibasilar atelectasis. No plain film findings for an acute abdominal process. Probable mild colonic ileus and constipation. Electronically Signed   By: Marijo Sanes M.D.   On: 06/15/2017 18:27   Dg Abd Portable 1v  Result Date: 06/15/2017  CLINICAL DATA:  Sepsis. EXAM: PORTABLE ABDOMEN - 1 VIEW; PORTABLE CHEST - 1 VIEW COMPARISON:  Chest x-ray 06/15/2017 and abdominal films 04/27/2017 FINDINGS: The portable semi upright chest film demonstrates normal heart size. Stable tortuosity and calcification of the thoracic aorta. Right IJ center venous catheter tip is in the distal SVC. Low lung volumes with vascular crowding and streaky basilar atelectasis. No definite effusions or infiltrates Two views of the abdomen demonstrate air filled colon with scattered stool down to the rectum. No findings for small bowel obstruction or free air. The soft tissue shadows are grossly maintained. Stable vascular calcifications. IMPRESSION: No acute cardiopulmonary findings. Low lung volumes with vascular crowding and streaky bibasilar atelectasis. No plain film findings for an acute abdominal process.  Probable mild colonic ileus and constipation. Electronically Signed   By: Marijo Sanes M.D.   On: 06/15/2017 18:27     STUDIES:  AbdUS 8/14> small echogenic focus in GB.  Slightly enlarged CBD up to 1cm.  No sonographic Murphy's.  Hepatic steatosis. CT A / P 8/15 >   CULTURES: BCX 2 8/14>>> UC 8/14>>>  ANTIBIOTICS: Merrem 8/14>>> vanc 8/14>>>  SIGNIFICANT EVENTS: 8/14 > admit  LINES/TUBES:   DISCUSSION: Septic shock. Presume UTI source vs intraabdominal. Has h/o K PNA ESBL producer which was last isolated in the SNF 8/6  ASSESSMENT / PLAN:  PULMONARY A: LLL Atelectasis vs HCAP - favor atx. P:   Bronchial hygiene  HCAP coverage Repeat CXR in am   CARDIOVASCULAR A:  Septic shock - presumed due to UTI vs intraabdominal process. H/o HTN  P:  Holding antihypertensives Cycle CEs Sepsis protocol 500cc bolus now (for CVP 7) See ID section   RENAL A:   AKI Lactic acidosis - resolved. P:   IVF resuscitation  Hold ace-I, diuretics and other antihypertensives Renal dose meds BMP in AM  GASTROINTESTINAL A:   Abd pain - unclear etiology.  Intraabdominal process vs referred from UTI? H/o constipation  P:   Assess lipase and amylase Assess CT abd / pelv with PO contrast NPO except meds  HEMATOLOGIC A:   Anemia of chronic disease VTE prophylaxis. P:  Transfuse for Hgb < 7 SCD's / heparin CBC in AM  INFECTIOUS A:   Septic shock. ->favor UTI source w/ recent MDR ESBL K PNA from Urine culture on 8/6. Was treated w/ bactrim which has some sensitivity. Also consider intraabdominal process vs recent left knee surg vs HCAP (but no symptoms)  P:   Continue empiric abd (vanc / merrem). PCT algo  See CV section   ENDOCRINE A:   DM w/ hyperglycemia  P:   SSI and hold orals   NEUROLOGIC/Muscular skeletal  A:   Acute encephalopathy c/b primary language barrier  Recent left knee surgery (6 weeks ago) P:   Hold sedating meds Supportive care   FAMILY   - Updates:  No family available.  - Inter-disciplinary family meet or Palliative Care meeting due by:  8/20  CC time: 30 min.   Montey Hora, [facility] Pager: [phone]  or [phone] 06/16/2017, 8:50 AM

## 2017-06-16 NOTE — Progress Notes (Signed)
Initial Nutrition Assessment  DOCUMENTATION CODES:   Severe malnutrition in context of acute illness/injury  INTERVENTION:   RD will order supplements when diet advanced  NUTRITION DIAGNOSIS:   Malnutrition (severe) related to acute illness as evidenced by 8 percent weight loss in 2 months, energy intake < or equal to 50% for > or equal to 5 days.  GOAL:   Patient will meet greater than or equal to 90% of their needs  MONITOR:   Diet advancement, Labs, Weight trends, I & O's  REASON FOR ASSESSMENT:   Malnutrition Screening Tool    ASSESSMENT:   76 y.o. female currently residing in a nursing home after deconditioning following a knee surgery in June. Recently found to have an ESBL Klebsiella pneumonia urinary tract infection. Treated briefly with Bactrim intravenously which was subsequently converted to oral. Initially the patient did improve then was noted to have decreased level of consciousness today and was hypotensive with systolic blood pressure in the 60-70s. Patient was found have a lactic acidosis as well as acute renal failure   Visited pt's room today. Unable to communicate with pt as pt with AMS but spoke to family member at bedside who reports that pt has not been eating well for 2 months. She reports chronic constipation and states that pt will eat for a few days and then start having nausea and vomiting and will stop eating again. Family member reports that on several occasions pt has had to be manually deopstipated. Per chart, pt has lost 18lbs(8%) in 2 months; this is significant. Pt is currently NPO for CT scan today. Per MD note, need to rule out abdominal infection. RD will order supplements when diet advanced.   Medications reviewed and include: aspirin, heparin, insulin, meropenem, levophed, vancomycin  Labs reviewed: BUN 48(H), creat 1.62(H), Ca 8.3(L), P 3.8 wnl, Mg 1.5(L) cbgs- 310, 220, 159 x 24 hrs  AIC 9.5 12/2016  Nutrition-Focused physical exam  completed. Findings are no fat depletion, no muscle depletion, and no edema.   Diet Order:  Diet NPO time specified Except for: Sips with Meds  Skin:  Wound (see comment) (Stage I sacrum )  Last BM:  8/14  Height:   Ht Readings from Last 1 Encounters:  06/15/17 5\' 7"  (1.702 m)    Weight:   Wt Readings from Last 1 Encounters:  06/16/17 194 lb 10.7 oz (88.3 kg)    Ideal Body Weight:  61.4 kg  BMI:  Body mass index is 30.49 kg/m.  Estimated Nutritional Needs:   Kcal:  1800-2100kcal/day   Protein:  88-105g/day   Fluid:  >1.8L/day   EDUCATION NEEDS:   Education needs no appropriate at this time  Koleen Distance MS, RD, [place] Pager #[phone] After Hours Pager: [phone]

## 2017-06-16 NOTE — Care Management Note (Signed)
Case Management Note  Patient Details  Name: Christy [name] MRN: [id number] Date of Birth: May 05, 1941  Subjective/Objective:     sepsis               Action/Plan: Date:  June 16, 2017 Chart reviewed for concurrent status and case management needs. Will continue to follow patient progress. Discharge Planning: following for needs Expected discharge date: [id number] Velva Harman, BSN, [place], [place]  Expected Discharge Date:   (unknown)               Expected Discharge Plan:  Home/Self Care  In-House Referral:     Discharge planning Services  CM Consult  Post Acute Care Choice:    Choice offered to:     DME Arranged:    DME Agency:     HH Arranged:    [place] Agency:     Status of Service:  In process, will continue to follow  If discussed at Long Length of Stay Meetings, dates discussed:    Additional Comments:  Leeroy Cha, RN 06/16/2017, 8:57 AM

## 2017-06-16 NOTE — Progress Notes (Signed)
*  PRELIMINARY RESULTS* Echocardiogram 2D Echocardiogram has been performed.  Christy [name] 06/16/2017, 1:56 PM

## 2017-06-16 NOTE — Progress Notes (Signed)
Pharmacy Antibiotic Note  Christy [name] is a 76 y.o. female admitted on 06/15/2017 with altered mental status, UTI.  She was recently treated for ESBL UTI infection & is currently on Bactrim per nursing home [facility].    Pharmacy has been consulted for Vancomycin & Meropenem dosing.  06/16/2017:   Tmax 99  Mild leukocytosis- improving.  Procalcitonin & LA are elevated.   Renal function improving with hydration.  Estimated CrCl~25ml/min.  Although baseline Scr ~0.6 in June 2018.  Plan:  Increase Vancomycin 1250mg  IV q24h  Increase Meropenem 1mg  IV q12h  Check Vancomycin trough at steady state  Monitor renal function and cx data   Height: 5\' 7"  (170.2 cm) Weight: 194 lb 10.7 oz (88.3 kg) IBW/kg (Calculated) : 61.6  Temp (24hrs), Avg:98.6 F (37 C), Min:98.3 F (36.8 C), Max:99 F (37.2 C)   Recent Labs Lab 06/15/17 1222 06/15/17 1233 06/15/17 1647 06/15/17 1708 06/15/17 1847 06/15/17 2216 06/16/17 0358 06/16/17 0402  WBC 15.5*  --  11.2*  --   --   --  7.4  --   CREATININE 3.00*  --  2.37*  --  2.30*  --  1.62*  --   LATICACIDVEN  --  7.37*  --  3.50* 3.1* 2.8*  --  1.6    Estimated Creatinine Clearance: 33.7 mL/min (A) (by C-G formula based on SCr of 1.62 mg/dL (H)).    No Known Allergies  Antimicrobials this admission: 8/14 Invanz x1 8/14 Vanc >>  8/14 Meropenem >>    Dose adjustments this admission: 8/15 increase meropenem from 500mg  to 1g q12h, vanc 1g to 1250mg  q24h for improved SCr  Microbiology results: 8/14 BCx: sent 8/14 UCx: sent  8/14 MRSA PCR: positive  6/26 UCx: ESBL Klebsiella -only sens imipenem 6/3 UCx: Enterococcus faecalis: S=Amp, Nirtrofurantoin, Vanc, I=Levofloxacin  Thank you for allowing pharmacy to be a part of this patient's care.  Peggyann Juba, PharmD, BCPS Pager: [phone] 06/16/2017 10:32 AM

## 2017-06-16 NOTE — Progress Notes (Signed)
Inpatient Diabetes Program Recommendations  AACE/ADA: New Consensus Statement on Inpatient Glycemic Control (2015)  Target Ranges:  Prepandial:   less than 140 mg/dL      Peak postprandial:   less than 180 mg/dL (1-2 hours)      Critically ill patients:  140 - 180 mg/dL   Lab Results  Component Value Date   GLUCAP 124 (H) 06/16/2017   HGBA1C 9.5 01/06/2017    Review of Glycemic Control  Diabetes history: DM2 Outpatient Diabetes medications: Amaryl 2 mg QAM, Levemir 26 units QHS, metformin 1000 mg bid Current orders for Inpatient glycemic control: Novolog 0-15 units Q4H Pt is NPO. Blood sugars trending well today.  Inpatient Diabetes Program Recommendations:    Will likely need portion of basal insulin soon. Would start with 50% home dose - Levemir 13 units QHS Hold OHAs while inpatient.  Diabetes Coordinator met with pt on 03/31/2017 and discussed her glucose management. Pt stated she would f/u with PCP about blood sugars being in 300s.  Will need to f/u with PCP for diabetes management and decreasing HgbA1C level.  Will follow while inpatient.  Thank you. Lorenda Peck, RD, LDN, CDE Inpatient Diabetes Coordinator [phone]

## 2017-06-17 ENCOUNTER — Inpatient Hospital Stay (HOSPITAL_COMMUNITY): Payer: Medicare Other

## 2017-06-17 DIAGNOSIS — E43 Unspecified severe protein-calorie malnutrition: Secondary | ICD-10-CM | POA: Insufficient documentation

## 2017-06-17 LAB — HEPATIC FUNCTION PANEL
ALK PHOS: 210 U/L — AB (ref 38–126)
ALT: 17 U/L (ref 14–54)
AST: 22 U/L (ref 15–41)
Albumin: 2.1 g/dL — ABNORMAL LOW (ref 3.5–5.0)
BILIRUBIN INDIRECT: 0.2 mg/dL — AB (ref 0.3–0.9)
BILIRUBIN TOTAL: 0.4 mg/dL (ref 0.3–1.2)
Bilirubin, Direct: 0.2 mg/dL (ref 0.1–0.5)
Total Protein: 5 g/dL — ABNORMAL LOW (ref 6.5–8.1)

## 2017-06-17 LAB — GLUCOSE, CAPILLARY
GLUCOSE-CAPILLARY: 107 mg/dL — AB (ref 65–99)
GLUCOSE-CAPILLARY: 103 mg/dL — AB (ref 65–99)
GLUCOSE-CAPILLARY: 80 mg/dL (ref 65–99)
GLUCOSE-CAPILLARY: 69 mg/dL (ref 65–99)
GLUCOSE-CAPILLARY: 82 mg/dL (ref 65–99)
Glucose-Capillary: 109 mg/dL — ABNORMAL HIGH (ref 65–99)

## 2017-06-17 LAB — BASIC METABOLIC PANEL
Anion gap: 7 (ref 5–15)
BUN: 36 mg/dL — ABNORMAL HIGH (ref 6–20)
CO2: 19 mmol/L — ABNORMAL LOW (ref 22–32)
Calcium: 7.8 mg/dL — ABNORMAL LOW (ref 8.9–10.3)
Chloride: 114 mmol/L — ABNORMAL HIGH (ref 101–111)
Creatinine, Ser: 0.89 mg/dL (ref 0.44–1.00)
GFR calc Af Amer: >60 mL/min (ref >60)
GLUCOSE: 105 mg/dL — AB (ref 65–99)
Potassium: 3 mmol/L — ABNORMAL LOW (ref 3.5–5.1)
SODIUM: 140 mmol/L (ref 135–145)

## 2017-06-17 LAB — CBC
HEMATOCRIT: 26.5 % — AB (ref 36.0–46.0)
HEMOGLOBIN: 8.9 g/dL — AB (ref 12.0–15.0)
MCH: 28.8 pg (ref 26.0–34.0)
MCHC: 33.6 g/dL (ref 30.0–36.0)
MCV: 85.8 fL (ref 78.0–100.0)
Platelets: 173 10*3/uL (ref 150–400)
RBC: 3.09 MIL/uL — ABNORMAL LOW (ref 3.87–5.11)
RDW: 14.7 % (ref 11.5–15.5)
WBC: 5.5 10*3/uL (ref 4.0–10.5)

## 2017-06-17 LAB — MAGNESIUM: MAGNESIUM: 1.7 mg/dL (ref 1.7–2.4)

## 2017-06-17 LAB — PHOSPHORUS: Phosphorus: 2.8 mg/dL (ref 2.5–4.6)

## 2017-06-17 LAB — PROCALCITONIN: Procalcitonin: 3.73 ng/mL

## 2017-06-17 MED ORDER — POTASSIUM CHLORIDE 10 MEQ/100ML IV SOLN
10.0000 meq | INTRAVENOUS | Status: AC
  Administered 2017-06-17 (×6): 10 meq via INTRAVENOUS
  Filled 2017-06-17 (×4): qty 100

## 2017-06-17 MED ORDER — SODIUM CHLORIDE 0.9% FLUSH
10.0000 mL | INTRAVENOUS | Status: DC | PRN
  Administered 2017-06-17 – 2017-06-22 (×3): 10 mL
  Filled 2017-06-17 (×3): qty 40

## 2017-06-17 MED ORDER — SODIUM CHLORIDE 0.9% FLUSH
10.0000 mL | Freq: Two times a day (BID) | INTRAVENOUS | Status: DC
  Administered 2017-06-17 – 2017-06-19 (×4): 10 mL
  Administered 2017-06-19 – 2017-06-20 (×2): 20 mL
  Administered 2017-06-20 – 2017-06-27 (×14): 10 mL
  Administered 2017-06-27: 20 mL
  Administered 2017-06-28 – 2017-06-30 (×5): 10 mL
  Administered 2017-06-30: 40 mL
  Administered 2017-07-01 – 2017-07-04 (×8): 10 mL
  Administered 2017-07-05: 30 mL
  Administered 2017-07-05: 10 mL
  Administered 2017-07-06: 30 mL
  Administered 2017-07-06: 10 mL

## 2017-06-17 MED ORDER — DEXTROSE 50 % IV SOLN
INTRAVENOUS | Status: AC
  Administered 2017-06-17: 25 mL
  Filled 2017-06-17: qty 50

## 2017-06-17 MED ORDER — SODIUM CHLORIDE 0.9 % IV SOLN
1.0000 g | Freq: Three times a day (TID) | INTRAVENOUS | Status: DC
  Administered 2017-06-17 – 2017-06-26 (×27): 1 g via INTRAVENOUS
  Filled 2017-06-17 (×29): qty 1

## 2017-06-17 MED ORDER — CHLORHEXIDINE GLUCONATE CLOTH 2 % EX PADS
6.0000 | MEDICATED_PAD | Freq: Every day | CUTANEOUS | Status: DC
  Administered 2017-06-18 – 2017-07-08 (×18): 6 via TOPICAL

## 2017-06-17 MED ORDER — ASPIRIN EC 81 MG PO TBEC
81.0000 mg | DELAYED_RELEASE_TABLET | Freq: Every day | ORAL | Status: DC
  Administered 2017-06-17 – 2017-06-26 (×5): 81 mg via ORAL
  Filled 2017-06-17 (×7): qty 1

## 2017-06-17 NOTE — Progress Notes (Signed)
Pharmacy Antibiotic Note  Christy [name] is a 76 y.o. female admitted on 06/15/2017 with altered mental status, UTI.  She was recently treated for ESBL UTI infection & is currently on Bactrim per nursing home [facility].    Pharmacy has been consulted for Vancomycin & Meropenem dosing.  06/17/2017:   Afebrile  WBC now wnl    Procalcitonin improving.  Renal function improving with hydration.  Estimated CrCl~66ml/min.   Plan:  Increase Meropenem 1mg  IV q8h  Monitor renal function and cx data   Height: 5\' 7"  (170.2 cm) Weight: 194 lb 14.2 oz (88.4 kg) IBW/kg (Calculated) : 61.6  Temp (24hrs), Avg:98.3 F (36.8 C), Min:98 F (36.7 C), Max:98.5 F (36.9 C)   Recent Labs Lab 06/15/17 1222 06/15/17 1233 06/15/17 1647 06/15/17 1708 06/15/17 1847 06/15/17 2216 06/16/17 0358 06/16/17 0402 06/17/17 0457  WBC 15.5*  --  11.2*  --   --   --  7.4  --  5.5  CREATININE 3.00*  --  2.37*  --  2.30*  --  1.62*  --  0.89  LATICACIDVEN  --  7.37*  --  3.50* 3.1* 2.8*  --  1.6  --     Estimated Creatinine Clearance: 61.4 mL/min (by C-G formula based on SCr of 0.89 mg/dL).    No Known Allergies  Antimicrobials this admission: 8/14 Invanz x1 8/14 Vanc >> 8/16 8/14 Meropenem >>    Dose adjustments this admission: 8/15 increase meropenem from 500mg  to 1g q12h, vanc 1g to 1250mg  q24h for improved SCr 8/16 increase from 1g q12h to 1g q8h for improved SCr Microbiology results: 8/14 BCx: ngtd 8/14 UCx: >100k Klebsiella pneumoniae                  >100k Enterococcus faecalis 8/14 MRSA PCR: positive 6/26 UCx: ESBL Klebsiella -only sens imipenem 6/3 UCx: Enterococcus faecalis: S=Amp, Nirtrofurantoin, Vanc, I=Levofloxacin  Thank you for allowing pharmacy to be a part of this patient's care.  Peggyann Juba, PharmD, BCPS Pager: [phone] 06/17/2017 11:50 AM

## 2017-06-17 NOTE — Progress Notes (Signed)
PULMONARY / CRITICAL CARE MEDICINE   Name: Christy [name] MRN: [id number] DOB: Aug 14, 1941    ADMISSION DATE:  06/15/2017 CONSULTATION DATE:  8/14  REFERRING MD:  Alvino Chapel   CHIEF COMPLAINT:  Septic shock  HISTORY OF PRESENT ILLNESS:   76 yo [facility] patient (resides there after knee surgery and deconditioning that followed since June 2018). Has h/u ESBL Klebsiella PNA UTI. Was in [place] until about 10d ago. Got weak, c/o RLQ discomfort. Treated for UTI which cultures again grew out ESBL K PNA. She was treated w/ bactrim at that time IV for 2 d then converted to PO. Initially felt better. Then was found 8/14 by nursing staff w/ decreased LOC, hypotensive w/ SBP in 60-70s. EMS called. Sepsis protocol initiated. LA > 7. Was given 30 ml/kg fluid challenge by ER team, remained hypotensive but MS improved some. Cultures were obtained, abx started. PCCM asked to admit.   SUBJECTIVE:  RN reports levophed weaned off at 2300.  BP stable.  PT asking for something to drink.   VITAL SIGNS: BP (!) 103/47   Pulse 83   Temp 98.4 F (36.9 C) (Oral)   Resp 20   Ht 5\' 7"  (1.702 m)   Wt 194 lb 14.2 oz (88.4 kg)   SpO2 94%   BMI 30.52 kg/m   HEMODYNAMICS: CVP:  [3 mmHg-18 mmHg] 7 mmHg  VENTILATOR SETTINGS:    INTAKE / OUTPUT: I/O last 3 completed shifts: In: 5754.7 [I.V.:4804.7; IV Piggyback:950] Out: 1200 [Urine:1200]  PHYSICAL EXAMINATION: General: elderly female in NAD, lying in bed   HEENT: MM pink/moist, no jvd PSY: calm Neuro: awake, alert, attempts to communicate CV: s1s2 rrr, no m/r/g PULM: even/non-labored, lungs bilaterally coarse  HF:WYOV, non-tender, bsx4 active  Extremities: warm/dry, 1+ BLE pitting edema  Skin: no rashes or lesions  LABS:  BMET  Recent Labs Lab 06/15/17 1847 06/16/17 0358 06/17/17 0457  NA 137 137 140  K 3.8 3.5 3.0*  CL 108 108 114*  CO2 18* 19* 19*  BUN 54* 48* 36*  CREATININE 2.30* 1.62* 0.89  GLUCOSE 220* 159* 105*    Electrolytes  Recent  Labs Lab 06/15/17 1847 06/16/17 0358 06/17/17 0457  CALCIUM 8.6* 8.3* 7.8*  MG  --  1.5* 1.7  PHOS  --  3.8 2.8    CBC  Recent Labs Lab 06/15/17 1647 06/16/17 0358 06/17/17 0457  WBC 11.2* 7.4 5.5  HGB 10.0* 10.0* 8.9*  HCT 30.4* 29.2* 26.5*  PLT 170 179 173    Coag's  Recent Labs Lab 06/15/17 1647  APTT 33  INR 1.18    Sepsis Markers  Recent Labs Lab 06/15/17 1647  06/15/17 1847 06/15/17 2216 06/16/17 0358 06/16/17 0402 06/17/17 0457  LATICACIDVEN  --   < > 3.1* 2.8*  --  1.6  --   PROCALCITON 7.36  --   --   --  6.29  --  3.73  < > = values in this interval not displayed.  ABG  Recent Labs Lab 06/15/17 1650  PHART 7.342*  PCO2ART 29.7*  PO2ART 67.9*    Liver Enzymes  Recent Labs Lab 06/15/17 1222 06/17/17 0457  AST 29 22  ALT 17 17  ALKPHOS 102 210*  BILITOT 0.5 0.4  ALBUMIN 3.0* 2.1*    Cardiac Enzymes  Recent Labs Lab 06/15/17 2216 06/16/17 0358 06/16/17 0952  TROPONINI 0.08* 0.10* 0.10*    Glucose  Recent Labs Lab 06/16/17 1202 06/16/17 1546 06/16/17 1937 06/16/17 2309 06/17/17 0321 06/17/17 7858  GLUCAP 86 81 93 119* 107* 103*    Imaging Ct Abdomen Pelvis Wo Contrast  Result Date: 06/16/2017 CLINICAL DATA:  Inpatient. Abdominal pain, tenderness and guarding. Septic shock. EXAM: CT ABDOMEN AND PELVIS WITHOUT CONTRAST TECHNIQUE: Multidetector CT imaging of the abdomen and pelvis was performed following the standard protocol without IV contrast. COMPARISON:  06/15/2017 abdominal radiographs and abdominal sonogram. 04/27/2017 CT abdomen/pelvis. FINDINGS: Examination is limited by motion artifact and by streak artifact from the upper extremities. Lower chest: Dense consolidation with associated volume loss in the dependent lower lobes and lingula, favor atelectasis. Three-vessel coronary atherosclerosis. Tip of superior approach central venous catheter is seen at the cavoatrial junction. Healed deformities are again noted  and lower right ribs. Hepatobiliary: Normal liver size. No gross liver mass. Mildly distended gallbladder. No gallbladder wall thickening. Subcentimeter minimally hyperdense focus in the dependent gallbladder may represent sludge or tiny gallstones. Dilated common bile duct (10 mm diameter), mildly increased from 7 mm on 04/27/2017. No evidence of obstructing biliary mass or radiopaque choledocholithiasis. Pancreas: Normal, with no mass or duct dilation. Spleen: Normal size. No mass. Adrenals/Urinary Tract: Stable 1.9 cm right adrenal and 2.7 cm left adrenal adenomas. Punctate nonobstructing stone versus vascular calcification in the posterior interpolar right renal sinus. No additional potential renal stones. No hydronephrosis. No contour deforming renal masses. Limited bladder visualization due to streak artifact, with no gross bladder abnormality. Stomach/Bowel: Grossly normal stomach. Normal caliber small bowel with no small bowel wall thickening. Normal appendix. There is a moderate to large amount of stool throughout the large bowel and rectum. There is mild wall thickening in the splenic flexure of the colon, descending colon, sigmoid colon and rectum with associated pericolonic fat stranding and ill-defined fluid. No evidence of pneumatosis. No significant colonic diverticulosis. Vascular/Lymphatic: Atherosclerotic nonaneurysmal abdominal aorta. No pathologically enlarged lymph nodes in the abdomen or pelvis. Reproductive: Grossly normal uterus.  No adnexal mass. Other: Trace left paracolic gutter ascites. No focal fluid collection. No pneumoperitoneum. Mild anasarca. Musculoskeletal: No aggressive appearing focal osseous lesions. Partially visualized right total hip arthroplasty. Partially visualized surgical hardware in the bilateral wrists. Chronic mild L1 vertebral compression deformity is stable. Moderate thoracolumbar spondylosis. IMPRESSION: 1. Mild wall thickening in the splenic flexure, descending  and sigmoid colon and rectum with associated pericolonic fat stranding, suggestive of a nonspecific infectious or inflammatory colitis, with the differential including C. diff colitis. No pneumatosis, free air or abscess. 2. Moderate to large colorectal stool volume. 3. Trace left paracolic gutter ascites. 4. Dense consolidation with associated volume loss in the dependent lung bases, favor atelectasis . 5. Mildly distended gallbladder with minimal sludge versus tiny gallstones. No gallbladder wall thickening. 6. Dilated common bile duct (10 mm diameter), mildly increased. No evidence of obstructing biliary mass or radiopaque choledocholithiasis. Recommend correlation with serum bilirubin levels. 7. Stable bilateral adrenal adenomas. 8.  Aortic Atherosclerosis (ICD10-I70.0). Electronically Signed   By: Ilona Sorrel M.D.   On: 06/16/2017 16:50   Dg Chest Port 1 View  Result Date: 06/17/2017 CLINICAL DATA:  Respiratory failure. EXAM: PORTABLE CHEST 1 VIEW COMPARISON:  06/15/2017. FINDINGS: Right IJ line noted with its tip projected over the right atrium. Heart size normal. Right upper lobe mild infiltrate. Progressive left lower lobe atelectasis. Tiny left pleural effusion cannot be excluded . No pneumothorax. Surgical clips right chest. IMPRESSION: 1. Right IJ line noted with its tip over the right atrium. No pneumothorax. 2. New onset mild right upper lobe infiltrate suggesting pneumonia. Progressive lower lobe atelectasis.  Electronically Signed   By: Marcello Moores  Register   On: 06/17/2017 06:56     STUDIES:  AbdUS 8/14 >> small echogenic focus in GB.  Slightly enlarged CBD up to 1cm.  No sonographic Murphy's.  Hepatic steatosis. CT A / P 8/15 >>mild wall thickening in the splenic flexure, descending & sigmoid colon & rectum with associated fat stranding (suggestive of nonspecific infectious / inflammatory colitis) no free air or abscess.  Moderate to large colorectal stool volume, dense consolidation with  volume loss in the dependent bases (favor atelectasis).  Mildly distended gallbladder with minimal sludge vs tiny gallstones, dilated common bile duct without evidence of obstructing biliary mass  CULTURES: BCX 2 8/14 >> UC 8/14 >> greater 100k klebsiella pneumoniae >>   ANTIBIOTICS: Merrem 8/14 >> vanc 8/14 >>  SIGNIFICANT EVENTS: 8/14  Admit 8/15  Remains on 59mcg/min levophed.  CVP 7 this morning. C/o moderate abdominal pain with guarding. 8/16  Off pressors, VSS  LINES/TUBES:   DISCUSSION: Septic shock - presumed UTI with 100k klebsiella as source.  ABD/Pelvis CT with concern for non-specific colitis / inflammation.  Has h/o K PNA ESBL producer which was last isolated in the SNF 8/6  ASSESSMENT / PLAN:  PULMONARY A: Bilateral Atelectasis vs HCAP - favor atx. P:   Pulmonary hygiene - IS, mobilize  Continue abx as above  Follow CXR  O2 as needed to support sats > 90%  CARDIOVASCULAR A:  Septic shock - presumed due to UTI   H/o HTN  P:  Hold home antihypertensives  Levophed weaned off  See ID   RENAL A:   AKI - improving Hypokalemia  Lactic acidosis - resolved. P:   NS @ 63ml/hr Trend BMP / urinary output Replace electrolytes as indicated Avoid nephrotoxic agents, ensure adequate renal perfusion Hold home ACE-I, diuretics  GASTROINTESTINAL A:   Abd pain - see CT results above.  Intraabdominal process vs referred from UTI? Amylase/lipase negative.  H/o constipation  P:   Follow abdominal exam  Begin clear liquid diet > pt requesting food, no nausea / vomiting  Monitor for diarrhea  HEMATOLOGIC A:   Anemia of chronic disease VTE prophylaxis. P:  Trend CBC SCD's + Heparin for DVT prophylaxis  Transfuse per ICU guidelines  INFECTIOUS A:   Septic shock - in setting of Klebsiella UIT.  Hx of recent MDR ESBL K PNA from Urine culture on 8/6. Was treated w/ bactrim which has some sensitivity. Also consider intraabdominal process with non-specific  findings for colitis vs recent left knee surg vs HCAP (but no symptoms)  P:   ABX as above Follow cultures  Trend PCT   ENDOCRINE A:   DM w/ hyperglycemia  P:   SSI  Hold home agents  NEUROLOGIC / ORTHO  A:   Acute encephalopathy c/b primary language barrier  Recent left knee surgery (6 weeks ago) P:   Hold sedating medications  PT consult for mobility / recent L knee surgery  Supportive care  FAMILY  - Updates:  No family at bedside am 8/16.  Unable to find patient language on translation program per RN.   - Inter-disciplinary family meet or Palliative Care meeting due by:  8/20  - Global:  To TRH as of am 8/17.    [name], NP-C [facility] Pgr: [phone] or if no answer [phone] 06/17/2017, 8:46 AM

## 2017-06-17 NOTE — Progress Notes (Signed)
Hypoglycemic Event  CBG: 69  Treatment: D50 IV 25 mL  Symptoms: None  Follow-up CBG: Time:0524 CBG Result:109  Possible Reasons for Event: Inadequate meal intake  Comments/MD notified: Dr. Vaughan Browner made aware and has requested to be notified if blood sugar drops again because  Patient has poor oral intake    Yamilet Mcfayden L

## 2017-06-17 NOTE — Progress Notes (Signed)
PT Cancellation Note  Patient Details Name: Christy [name] MRN: [id number] DOB: 1940/11/06   Cancelled Treatment:    Reason Eval/Treat Not Completed: Attempted PT eval-pt refused to cooperate/participate with therapist. Will check back another day. If pt continues to refuse to participate, will sign off. Thanks.    Weston Anna, MPT Pager: [phone]

## 2017-06-18 ENCOUNTER — Inpatient Hospital Stay (HOSPITAL_COMMUNITY): Payer: Medicare Other

## 2017-06-18 DIAGNOSIS — J9601 Acute respiratory failure with hypoxia: Secondary | ICD-10-CM

## 2017-06-18 DIAGNOSIS — R748 Abnormal levels of other serum enzymes: Secondary | ICD-10-CM

## 2017-06-18 LAB — URINE CULTURE

## 2017-06-18 LAB — GLUCOSE, CAPILLARY
GLUCOSE-CAPILLARY: 102 mg/dL — AB (ref 65–99)
GLUCOSE-CAPILLARY: 121 mg/dL — AB (ref 65–99)
GLUCOSE-CAPILLARY: 136 mg/dL — AB (ref 65–99)
GLUCOSE-CAPILLARY: 62 mg/dL — AB (ref 65–99)
GLUCOSE-CAPILLARY: 90 mg/dL (ref 65–99)
Glucose-Capillary: 133 mg/dL — ABNORMAL HIGH (ref 65–99)
Glucose-Capillary: 96 mg/dL (ref 65–99)
Glucose-Capillary: 133 mg/dL — ABNORMAL HIGH (ref 65–99)

## 2017-06-18 LAB — BASIC METABOLIC PANEL
ANION GAP: 5 (ref 5–15)
BUN: 28 mg/dL — ABNORMAL HIGH (ref 6–20)
CALCIUM: 7.9 mg/dL — AB (ref 8.9–10.3)
CO2: 21 mmol/L — ABNORMAL LOW (ref 22–32)
Chloride: 115 mmol/L — ABNORMAL HIGH (ref 101–111)
Creatinine, Ser: 0.72 mg/dL (ref 0.44–1.00)
GLUCOSE: 107 mg/dL — AB (ref 65–99)
POTASSIUM: 3.3 mmol/L — AB (ref 3.5–5.1)
SODIUM: 141 mmol/L (ref 135–145)

## 2017-06-18 LAB — CBC
HCT: 26.7 % — ABNORMAL LOW (ref 36.0–46.0)
HEMOGLOBIN: 8.9 g/dL — AB (ref 12.0–15.0)
MCH: 29.3 pg (ref 26.0–34.0)
MCHC: 33.3 g/dL (ref 30.0–36.0)
MCV: 87.8 fL (ref 78.0–100.0)
PLATELETS: 194 10*3/uL (ref 150–400)
RBC: 3.04 MIL/uL — AB (ref 3.87–5.11)
RDW: 15.1 % (ref 11.5–15.5)
WBC: 5.2 10*3/uL (ref 4.0–10.5)

## 2017-06-18 LAB — BRAIN NATRIURETIC PEPTIDE: B Natriuretic Peptide: 245.2 pg/mL — ABNORMAL HIGH (ref 0.0–100.0)

## 2017-06-18 LAB — TROPONIN I
TROPONIN I: 0.05 ng/mL — AB (ref <0.03)
Troponin I: 0.04 ng/mL (ref <0.03)

## 2017-06-18 LAB — MAGNESIUM: MAGNESIUM: 1.7 mg/dL (ref 1.7–2.4)

## 2017-06-18 MED ORDER — IPRATROPIUM BROMIDE 0.02 % IN SOLN: 0.5000 mg | RESPIRATORY_TRACT | Status: DC

## 2017-06-18 MED ORDER — POTASSIUM CHLORIDE 10 MEQ/100ML IV SOLN
INTRAVENOUS | Status: AC
  Filled 2017-06-18: qty 100

## 2017-06-18 MED ORDER — ALBUTEROL SULFATE (5 MG/ML) 0.5% IN NEBU
2.5000 mg | INHALATION_SOLUTION | RESPIRATORY_TRACT | Status: DC
  Filled 2017-06-18: qty 0.5

## 2017-06-18 MED ORDER — FUROSEMIDE 10 MG/ML IJ SOLN
40.0000 mg | Freq: Once | INTRAMUSCULAR | Status: AC
  Administered 2017-06-18: 40 mg via INTRAVENOUS
  Filled 2017-06-18: qty 4

## 2017-06-18 MED ORDER — DEXTROSE 5 % AND 0.9 % NACL IV BOLUS
1000.0000 mL | Freq: Once | INTRAVENOUS | Status: DC
  Administered 2017-06-18: 1000 mL via INTRAVENOUS

## 2017-06-18 MED ORDER — POTASSIUM CHLORIDE 10 MEQ/100ML IV SOLN
10.0000 meq | INTRAVENOUS | Status: AC
  Administered 2017-06-18 (×3): 10 meq via INTRAVENOUS
  Filled 2017-06-18 (×3): qty 100

## 2017-06-18 MED ORDER — IPRATROPIUM-ALBUTEROL 0.5-2.5 (3) MG/3ML IN SOLN
3.0000 mL | RESPIRATORY_TRACT | Status: DC
  Administered 2017-06-18 – 2017-06-22 (×28): 3 mL via RESPIRATORY_TRACT
  Filled 2017-06-18 (×28): qty 3

## 2017-06-18 MED ORDER — VANCOMYCIN HCL IN DEXTROSE 1-5 GM/200ML-% IV SOLN
1000.0000 mg | Freq: Two times a day (BID) | INTRAVENOUS | Status: DC
  Administered 2017-06-18 – 2017-06-21 (×6): 1000 mg via INTRAVENOUS
  Filled 2017-06-18 (×6): qty 200

## 2017-06-18 NOTE — Progress Notes (Signed)
Patient ID: Christy [name], female   DOB: November 01, 1941, 76 y.o.   MRN: [id number]  PROGRESS NOTE    Christy [name]  GHW:[id number] DOB: 31-Jul-1941 DOA: 06/15/2017  PCP: [name], MD   Brief Narrative:  76 year old female (speaks [name] language), from NH after knee surgery and deconditioning that followed in 04/2017, has history of ESBL Klebsiella pneumoniae UTI. She started to experience right lower quadrant abdominal pain. Her cx continued to grow ESBL and she was treated with IV bactrim which was then changed to PO. On 8/14, she was found by NH staff with decreased consciousness, hypotensive with SBP in 60-70's. Lactic was was 7. She remained hypotensive with fluid challenge so she required pressor support. Pt transitioned to [facility] care 8/17 but this am pt is very rhonchorous. Her CXR today showed progressive RUL infiltrate consistent with pneumonia.   Significant event: 8/15 - on Levophed 8/16 - off pressor support  Assessment & Plan:   Active Problems:   Septic shock ([place]) / UTI Klebsiella - Septic shock on admission requiring pressor support from 8/14 - 8/16 - Urine cx grew more than 100K Klebsiella pneumoniae  - Continue Merem    Acute respiratory failure with hypoxia / HCAP - CXR this am showed progressive RUL infiltrate consistent with pneumonia  - Continue merem and vanco    Acute kidney injury / Hypokalemia - Due to septic shock - Cr now WNL    Abdominal pain - Possibly from UTI - Amylase, lipase negative    Acute metabolic encephalopathy - Due to septic shock - She does speak [name] language, is oriented as I was able to communicate with her in our language     Protein-calorie malnutrition, severe - In the context of acute on chronic illness - Diet as tolerated    DVT prophylaxis:  SCD's Code Status: full code  Family Communication: no family at the bedside Disposition Plan: transfer to SDU   Consultants:   PCCM  Procedures:   None    Antimicrobials:   Merem 8/14 -->  Vanco 8/14 -->   Subjective: No overnight events. This am, more distress, appears ill and using accessory muscles for breathing.   Objective: Vitals:   06/17/17 1431 06/17/17 2005 06/18/17 0542 06/18/17 0806  BP: 119/77 (!) 121/55 126/66   Pulse: 83 84 91   Resp: 20 (!) 24 20   Temp: 97.7 F (36.5 C) 99.5 F (37.5 C) 98.1 F (36.7 C)   TempSrc: Oral Axillary Oral   SpO2: 96% 91% 93% 93%  Weight:   91.5 kg (201 lb 11.5 oz)   Height:        Intake/Output Summary (Last 24 hours) at 06/18/17 0816 Last data filed at 06/17/17 1300  Gross per 24 hour  Intake           440.42 ml  Output              200 ml  Net           240.42 ml   Filed Weights   06/16/17 0500 06/17/17 0500 06/18/17 0542  Weight: 88.3 kg (194 lb 10.7 oz) 88.4 kg (194 lb 14.2 oz) 91.5 kg (201 lb 11.5 oz)    Examination:  General exam: Appears ill, using accessory muscles for breathing  Respiratory system: very rhonchorous, wheezing in upper lung lobes  Cardiovascular system: S1 & S2 heard, Rate controlled  Gastrointestinal system: Abdomen is nondistended, soft and nontender. No organomegaly or masses felt. Normal bowel sounds  heard. Central nervous system: Oriented to place and self, she speaks [name] language and able to communicate with me how she feels. Extremities: Symmetric 5 x 5 power. Skin: No rashes, lesions or ulcers Psychiatry: Normal mood and behavior   Data Reviewed: I have personally reviewed following labs and imaging studies  CBC:  Recent Labs Lab 06/15/17 1222 06/15/17 1647 06/16/17 0358 06/17/17 0457 06/18/17 0330  WBC 15.5* 11.2* 7.4 5.5 5.2  NEUTROABS 13.4*  --   --   --   --   HGB 11.4* 10.0* 10.0* 8.9* 8.9*  HCT 34.7* 30.4* 29.2* 26.5* 26.7*  MCV 87.6 86.6 86.9 85.8 87.8  PLT 176 170 179 173 353   Basic Metabolic Panel:  Recent Labs Lab 06/15/17 1222 06/15/17 1647 06/15/17 1847 06/16/17 0358 06/17/17 0457 06/18/17 0330   NA 135  --  137 137 140 141  K 3.9  --  3.8 3.5 3.0* 3.3*  CL 101  --  108 108 114* 115*  CO2 15*  --  18* 19* 19* 21*  GLUCOSE 310*  --  220* 159* 105* 107*  BUN 54*  --  54* 48* 36* 28*  CREATININE 3.00* 2.37* 2.30* 1.62* 0.89 0.72  CALCIUM 9.4  --  8.6* 8.3* 7.8* 7.9*  MG  --   --   --  1.5* 1.7 1.7  PHOS  --   --   --  3.8 2.8  --    GFR: Estimated Creatinine Clearance: 69.5 mL/min (by C-G formula based on SCr of 0.72 mg/dL). Liver Function Tests:  Recent Labs Lab 06/15/17 1222 06/17/17 0457  AST 29 22  ALT 17 17  ALKPHOS 102 210*  BILITOT 0.5 0.4  PROT 6.3* 5.0*  ALBUMIN 3.0* 2.1*    Recent Labs Lab 06/16/17 0952  LIPASE 18  AMYLASE 85   No results for input(s): AMMONIA in the last 168 hours. Coagulation Profile:  Recent Labs Lab 06/15/17 1647  INR 1.18   Cardiac Enzymes:  Recent Labs Lab 06/15/17 1647 06/15/17 2216 06/16/17 0358 06/16/17 0952  TROPONINI 0.06* 0.08* 0.10* 0.10*   BNP (last 3 results) No results for input(s): PROBNP in the last 8760 hours. HbA1C: No results for input(s): HGBA1C in the last 72 hours. CBG:  Recent Labs Lab 06/17/17 1623 06/17/17 1719 06/17/17 2032 06/18/17 0004 06/18/17 0407  GLUCAP 69 109* 82 62* 90   Lipid Profile: No results for input(s): CHOL, HDL, LDLCALC, TRIG, CHOLHDL, LDLDIRECT in the last 72 hours. Thyroid Function Tests: No results for input(s): TSH, T4TOTAL, FREET4, T3FREE, THYROIDAB in the last 72 hours. Anemia Panel: No results for input(s): VITAMINB12, FOLATE, FERRITIN, TIBC, IRON, RETICCTPCT in the last 72 hours. Urine analysis:    Component Value Date/Time   COLORURINE RED (A) 06/15/2017 1320   APPEARANCEUR CLOUDY (A) 06/15/2017 1320   LABSPEC 1.013 06/15/2017 1320   PHURINE 7.0 06/15/2017 1320   GLUCOSEU NEGATIVE 06/15/2017 1320   HGBUR SMALL (A) 06/15/2017 1320   BILIRUBINUR NEGATIVE 06/15/2017 1320   KETONESUR NEGATIVE 06/15/2017 1320   PROTEINUR NEGATIVE 06/15/2017 1320    UROBILINOGEN 0.2 11/27/2013 2012   NITRITE NEGATIVE 06/15/2017 1320   LEUKOCYTESUR TRACE (A) 06/15/2017 1320   Sepsis Labs: @LABRCNTIP (procalcitonin:4,lacticidven:4)   ) Recent Results (from the past 240 hour(s))  Urine culture     Status: Abnormal (Preliminary result)   Collection Time: 06/15/17 12:18 PM  Result Value Ref Range Status   Specimen Description URINE, CATHETERIZED  Final   Special Requests NONE  Final  Culture (A)  Final    >=100,000 COLONIES/mL KLEBSIELLA PNEUMONIAE >=100,000 COLONIES/mL ENTEROCOCCUS FAECALIS SUSCEPTIBILITIES TO FOLLOW Performed at [facility], [place] [address], [place], [place] [address]    Report Status PENDING  Incomplete  Blood Culture (routine x 2)     Status: None (Preliminary result)   Collection Time: 06/15/17 12:22 PM  Result Value Ref Range Status   Specimen Description BLOOD LEFT ANTECUBITAL  Final   Special Requests   Final    BOTTLES DRAWN AEROBIC AND ANAEROBIC Blood Culture adequate volume   Culture   Final    NO GROWTH 2 DAYS Performed at [facility], [place] [address], [place], [place] [address]    Report Status PENDING  Incomplete  Blood Culture (routine x 2)     Status: None (Preliminary result)   Collection Time: 06/15/17 12:46 PM  Result Value Ref Range Status   Specimen Description BLOOD RIGHT ANTECUBITAL  Final   Special Requests   Final    BOTTLES DRAWN AEROBIC AND ANAEROBIC Blood Culture adequate volume   Culture   Final    NO GROWTH 2 DAYS Performed at [facility], [place] [address], [place], [place] [address]    Report Status PENDING  Incomplete  MRSA PCR Screening     Status: Abnormal   Collection Time: 06/15/17  6:30 PM  Result Value Ref Range Status   MRSA by PCR POSITIVE (A) NEGATIVE Final    Comment:        The GeneXpert MRSA Assay (FDA approved for NASAL specimens only), is one component of a comprehensive MRSA colonization surveillance program. It is not intended to diagnose  MRSA infection nor to guide or monitor treatment for MRSA infections. RESULT CALLED TO, READ BACK BY AND VERIFIED WITH: Audree Camel [id number] @ [id number] BY J SCOTTON       Radiology Studies: Ct Abdomen Pelvis Wo Contrast  Result Date: 06/16/2017 CLINICAL DATA:  Inpatient. Abdominal pain, tenderness and guarding. Septic shock. EXAM: CT ABDOMEN AND PELVIS WITHOUT CONTRAST TECHNIQUE: Multidetector CT imaging of the abdomen and pelvis was performed following the standard protocol without IV contrast. COMPARISON:  06/15/2017 abdominal radiographs and abdominal sonogram. 04/27/2017 CT abdomen/pelvis. FINDINGS: Examination is limited by motion artifact and by streak artifact from the upper extremities. Lower chest: Dense consolidation with associated volume loss in the dependent lower lobes and lingula, favor atelectasis. Three-vessel coronary atherosclerosis. Tip of superior approach central venous catheter is seen at the cavoatrial junction. Healed deformities are again noted and lower right ribs. Hepatobiliary: Normal liver size. No gross liver mass. Mildly distended gallbladder. No gallbladder wall thickening. Subcentimeter minimally hyperdense focus in the dependent gallbladder may represent sludge or tiny gallstones. Dilated common bile duct (10 mm diameter), mildly increased from 7 mm on 04/27/2017. No evidence of obstructing biliary mass or radiopaque choledocholithiasis. Pancreas: Normal, with no mass or duct dilation. Spleen: Normal size. No mass. Adrenals/Urinary Tract: Stable 1.9 cm right adrenal and 2.7 cm left adrenal adenomas. Punctate nonobstructing stone versus vascular calcification in the posterior interpolar right renal sinus. No additional potential renal stones. No hydronephrosis. No contour deforming renal masses. Limited bladder visualization due to streak artifact, with no gross bladder abnormality. Stomach/Bowel: Grossly normal stomach. Normal caliber small bowel with no small bowel  wall thickening. Normal appendix. There is a moderate to large amount of stool throughout the large bowel and rectum. There is mild wall thickening in the splenic flexure of the colon, descending colon, sigmoid colon  and rectum with associated pericolonic fat stranding and ill-defined fluid. No evidence of pneumatosis. No significant colonic diverticulosis. Vascular/Lymphatic: Atherosclerotic nonaneurysmal abdominal aorta. No pathologically enlarged lymph nodes in the abdomen or pelvis. Reproductive: Grossly normal uterus.  No adnexal mass. Other: Trace left paracolic gutter ascites. No focal fluid collection. No pneumoperitoneum. Mild anasarca. Musculoskeletal: No aggressive appearing focal osseous lesions. Partially visualized right total hip arthroplasty. Partially visualized surgical hardware in the bilateral wrists. Chronic mild L1 vertebral compression deformity is stable. Moderate thoracolumbar spondylosis. IMPRESSION: 1. Mild wall thickening in the splenic flexure, descending and sigmoid colon and rectum with associated pericolonic fat stranding, suggestive of a nonspecific infectious or inflammatory colitis, with the differential including C. diff colitis. No pneumatosis, free air or abscess. 2. Moderate to large colorectal stool volume. 3. Trace left paracolic gutter ascites. 4. Dense consolidation with associated volume loss in the dependent lung bases, favor atelectasis . 5. Mildly distended gallbladder with minimal sludge versus tiny gallstones. No gallbladder wall thickening. 6. Dilated common bile duct (10 mm diameter), mildly increased. No evidence of obstructing biliary mass or radiopaque choledocholithiasis. Recommend correlation with serum bilirubin levels. 7. Stable bilateral adrenal adenomas. 8.  Aortic Atherosclerosis (ICD10-I70.0). Electronically Signed   By: Ilona Sorrel M.D.   On: 06/16/2017 16:50   Dg Chest 2 View  Result Date: 06/15/2017 CLINICAL DATA:  Recently treated urinary tract  infection, hypotension, possible mental status change. History of breast malignancy. EXAM: CHEST  2 VIEW COMPARISON:  Chest x-ray of April 27, 2017 FINDINGS: There is mild hypoinflation of the right lung. The interstitial markings of both lungs are coarse. There is density at the left lung base likely in the lower lobe which appears new. The cardiac silhouette is enlarged. The pulmonary vascularity is not clearly engorged. There is tortuosity of the descending thoracic aorta with calcification in the wall. There are chronic changes of the right shoulder. IMPRESSION: Findings worrisome for atelectasis or pneumonia in the left lower lobe. Followup PA and lateral chest X-ray is recommended in 3-4 weeks following trial of antibiotic therapy to ensure resolution and exclude underlying malignancy. Stable cardiomegaly without pulmonary vascular congestion or pulmonary edema. Thoracic aortic atherosclerosis. Electronically Signed   By: David  Martinique M.D.   On: 06/15/2017 13:17   US Abdomen Complete  Result Date: 06/15/2017 CLINICAL DATA:  Abdominal pain with nausea and vomiting EXAM: ABDOMEN ULTRASOUND COMPLETE COMPARISON:  Radiograph 04/29/2017, ultrasound 04/28/2017, CT 04/27/2017 FINDINGS: Gallbladder: Small echogenic foci in the gallbladder measuring up to 11 mm but without much shadowing, could represent small amount of sludge or small stones. Negative sonographic Murphy. Normal wall thickness. Common bile duct: Diameter: Slightly enlarged, measuring up to 1 cm, but similar compared with prior ultrasound from June. Liver: Increased echogenicity.  No focal abnormality is seen. IVC: Limited visualization.  No significant abnormality. Pancreas: Visualized portion unremarkable. Spleen: Size and appearance within normal limits. Right Kidney: Length: 12.9 cm. Echogenicity within normal limits. No mass or hydronephrosis visualized. Left Kidney: Length: 12 cm. Echogenicity within normal limits. No mass or hydronephrosis  visualized. Abdominal aorta: Proximal aorta 2.4 cm. Mid to distal aorta obscured by bowel gas Other findings: None. IMPRESSION: 1. Small linear echogenic focus in the gallbladder could relate to a small amount of sludge or possible small stones. Normal wall thickness and negative sonographic Murphy. 2. Slightly enlarged common bowel duct up to 1 cm but without change since prior ultrasound from June 2018. Correlation with laboratory values previously suggested 3. Hepatic steatosis Electronically Signed  By: Donavan Foil M.D.   On: 06/15/2017 18:05   Dg Chest Port 1 View  Result Date: 06/18/2017 CLINICAL DATA:  Respiratory failure. EXAM: PORTABLE CHEST 1 VIEW COMPARISON:  06/17/2017. FINDINGS: Right IJ catheter again noted with tip in the right atrium. Heart size stable. Persistent right upper lobe infiltrate, progressed from prior exam. Findings consist with pneumonia. Mild left base subsegmental atelectasis. No pleural effusion pneumothorax. Surgical clips right chest. IMPRESSION: 1. Right IJ catheter again noted with tip in the right atrium . 2. Progressive right upper lobe infiltrate consistent with pneumonia. 3. Mild left base subsegmental atelectasis. Electronically Signed   By: Marcello Moores  Register   On: 06/18/2017 07:07   Dg Chest Port 1 View  Result Date: 06/17/2017 CLINICAL DATA:  Respiratory failure. EXAM: PORTABLE CHEST 1 VIEW COMPARISON:  06/15/2017. FINDINGS: Right IJ line noted with its tip projected over the right atrium. Heart size normal. Right upper lobe mild infiltrate. Progressive left lower lobe atelectasis. Tiny left pleural effusion cannot be excluded . No pneumothorax. Surgical clips right chest. IMPRESSION: 1. Right IJ line noted with its tip over the right atrium. No pneumothorax. 2. New onset mild right upper lobe infiltrate suggesting pneumonia. Progressive lower lobe atelectasis. Electronically Signed   By: Marcello Moores  Register   On: 06/17/2017 06:56   Dg Chest Port 1 View  Result  Date: 06/15/2017 CLINICAL DATA:  Sepsis. EXAM: PORTABLE ABDOMEN - 1 VIEW; PORTABLE CHEST - 1 VIEW COMPARISON:  Chest x-ray 06/15/2017 and abdominal films 04/27/2017 FINDINGS: The portable semi upright chest film demonstrates normal heart size. Stable tortuosity and calcification of the thoracic aorta. Right IJ center venous catheter tip is in the distal SVC. Low lung volumes with vascular crowding and streaky basilar atelectasis. No definite effusions or infiltrates Two views of the abdomen demonstrate air filled colon with scattered stool down to the rectum. No findings for small bowel obstruction or free air. The soft tissue shadows are grossly maintained. Stable vascular calcifications. IMPRESSION: No acute cardiopulmonary findings. Low lung volumes with vascular crowding and streaky bibasilar atelectasis. No plain film findings for an acute abdominal process. Probable mild colonic ileus and constipation. Electronically Signed   By: Marijo Sanes M.D.   On: 06/15/2017 18:27   Dg Abd Portable 1v  Result Date: 06/15/2017 CLINICAL DATA:  Sepsis. EXAM: PORTABLE ABDOMEN - 1 VIEW; PORTABLE CHEST - 1 VIEW COMPARISON:  Chest x-ray 06/15/2017 and abdominal films 04/27/2017 FINDINGS: The portable semi upright chest film demonstrates normal heart size. Stable tortuosity and calcification of the thoracic aorta. Right IJ center venous catheter tip is in the distal SVC. Low lung volumes with vascular crowding and streaky basilar atelectasis. No definite effusions or infiltrates Two views of the abdomen demonstrate air filled colon with scattered stool down to the rectum. No findings for small bowel obstruction or free air. The soft tissue shadows are grossly maintained. Stable vascular calcifications. IMPRESSION: No acute cardiopulmonary findings. Low lung volumes with vascular crowding and streaky bibasilar atelectasis. No plain film findings for an acute abdominal process. Probable mild colonic ileus and constipation.  Electronically Signed   By: Marijo Sanes M.D.   On: 06/15/2017 18:27     Scheduled Meds: . aspirin EC  81 mg Oral Daily  . citalopram  20 mg Oral Daily  . heparin  5,000 Units Subcutaneous Q8H  . insulin aspart  0-15 Units Subcutaneous Q4H  . ipratropium-albutero  3 mL Nebulization Q4H   Continuous Infusions: . sodium chloride 50 mL/hr at 06/17/17  1137  . sodium chloride    . dextrose 5 % and 0.9% NaCl    . meropenem (MERREM) IV Stopped (06/18/17 0130)  . potassium chloride       LOS: 3 days    Time spent: 25 minutes  Greater than 50% of the time spent on counseling and coordinating the care.   Leisa Lenz, MD [facility] Pager [phone]  If 7PM-7AM, please contact night-coverage [email or web address] Password [email or web address] 06/18/2017, 8:16 AM

## 2017-06-18 NOTE — Consult Note (Signed)
Cardiology Consultation:   Patient ID: [name]; [id number]; [birth year]   Admit date: 06/15/2017 Date of Consult: 06/18/2017  Primary Care Provider: [name], MD Primary Cardiologist: Dr Acie Fredrickson   Patient Profile:   Christy [name] is a 76 y.o. female with a hx of debilitation after fall Spring of 2018 who is being seen today for the evaluation of abnormal echo and elevated Troponin at the request of Dr Elsworth Soho.  History of Present Illness:   Christy [name] is a 76 y/o [name] female who was seen by Dr Acie Fredrickson May 2018 after Christy [name] had a fall coming out of church and suffered bilateral wrist fractures as well as a fractured Lt patella.  Dr Acie Fredrickson saw her in consult for an elevated Troponin (0.21). Her echo in May showed normal LVF (no WMA).   Christy [name] was discharge to SNF and has remained there. Christy [name] was admitted to [facility] 06/15[facility]17 with sepsis with AKI (SCr 3.0) felt to be secondary to UTI. Christy [name] improved initially but developed acute respiratory failure after being transferred to the floor. An echo was done 8/15 (not sure why)  which showed ? new anterior septal, apical inferior WMA. Her Troponin was 0.1 on 8/15. Her EKG shows NSR LBBB (same as May 2018). When I went to see her Christy [name] was in respiratory distress, Christy [name] had just received Lasix ( I/O + 12L )and is being moved to stepdown.    Past Medical History:  Diagnosis Date  . Abnormal ECG    Inferior Q waves  . Cancer ([place])    stage II right breast cancer  . Cataract   . Diabetes mellitus   . Hypercholesterolemia   . Hyperlipidemia   . Hypertension   . Osteoporosis   . Overactive bladder     Past Surgical History:  Procedure Laterality Date  . BREAST SURGERY     right lumpectomy, sentinel node biopsy  . CATARACT EXTRACTION Bilateral 2008  . FRACTURE SURGERY     ORIF right distal radius fx  . JOINT REPLACEMENT     right hip  . ORIF RADIAL FRACTURE Bilateral 03/29/2017   Procedure: OPEN REDUCTION INTERNAL FIXATION (ORIF) RADIAL  FRACTURES;  Surgeon: Iran Planas, MD;  Location: [place];  Service: Orthopedics;  Laterality: Bilateral;  . PATELLECTOMY Left 03/31/2017   Procedure: LEFT PARTIAL PATELLECTOMY;  Surgeon: Leandrew Koyanagi, MD;  Location: [place];  Service: Orthopedics;  Laterality: Left;  . PORT-A-CATH REMOVAL  09/11/2011   Procedure: REMOVAL PORT-A-CATH;  Surgeon: Rolm Bookbinder, MD;  Location: [place];  Service: General;  Laterality: Left;  local   . PORTACATH PLACEMENT         Inpatient Medications: Scheduled Meds: . aspirin EC  81 mg Oral Daily  . Chlorhexidine Gluconate Cloth  6 each Topical Q0600  . Chlorhexidine Gluconate Cloth  6 each Topical Daily  . citalopram  20 mg Oral Daily  . insulin aspart  0-15 Units Subcutaneous Q4H  . ipratropium-albuterol  3 mL Nebulization Q4H  . mouth rinse  15 mL Mouth Rinse BID  . mupirocin ointment  1 application Nasal BID  . sodium chloride flush  10-40 mL Intracatheter Q12H   Continuous Infusions: . sodium chloride    . meropenem (MERREM) IV Stopped (06/18/17 7673)  . vancomycin     PRN Meds: sodium chloride, iopamidol, ondansetron (ZOFRAN) IV, sodium chloride flush  Allergies:   No Known Allergies  Social History:   Social History   Social History  . Marital status:  Widowed    Spouse name: N/A  . Number of children: N/A  . Years of education: N/A   Occupational History  . Retired    Social History Main Topics  . Smoking status: Former Smoker    Types: Cigarettes    Quit date: 12/27/2012  . Smokeless tobacco: Never Used     Comment: quit 2009  . Alcohol use No  . Drug use: No  . Sexual activity: No   Other Topics Concern  . Not on file   Social History Narrative   From [place], Training and development officer. Christy [name] is [place] but can understand [name] and [place]. Christy [name] has a son and daughter who help in her care.    Family History:    Family History  Problem Relation Age of Onset  . Stroke Mother   . Stroke Father   .  Hypertension Sister   . Stroke Sister   . Stroke Brother      ROS:  Please see the history of present illness.  ROS  All other ROS reviewed and negative.     Physical Exam/Data:   Vitals:   06/17/17 1431 06/17/17 2005 06/18/17 0542 06/18/17 0806  BP: 119/77 (!) 121/55 126/66   Pulse: 83 84 91   Resp: 20 (!) 24 20   Temp: 97.7 F (36.5 C) 99.5 F (37.5 C) 98.1 F (36.7 C)   TempSrc: Oral Axillary Oral   SpO2: 96% 91% 93% 93%  Weight:   201 lb 11.5 oz (91.5 kg)   Height:        Intake/Output Summary (Last 24 hours) at 06/18/17 1021 Last data filed at 06/17/17 1300  Gross per 24 hour  Intake           440.42 ml  Output              200 ml  Net           240.42 ml   Filed Weights   06/16/17 0500 06/17/17 0500 06/18/17 0542  Weight: 194 lb 10.7 oz (88.3 kg) 194 lb 14.2 oz (88.4 kg) 201 lb 11.5 oz (91.5 kg)   Body mass index is 31.59 kg/m.  General:  Well nourished, well developed HEENT: normal Lymph: no adenopathy Neck: no JVD Endocrine:  No thryomegaly Vascular: No carotid bruits; FA pulses 2+ bilaterally without bruits  Cardiac:  Normal rhythm, decreased heart sounds Lungs:  diffuse rhonchi, rales Abd: soft, nontender, no hepatomegaly  Ext: no edema, both feet in protective boots Musculoskeletal:  No deformities, BUE and BLE strength normal and equal Skin: warm and dry  Neuro:  CNs 2-12 intact, no focal abnormalities noted Psych:  Normal affect   EKG:  The EKG was personally reviewed and demonstrates:  NSR, LBBB  Relevant CV Studies: Echo 06/16/17-  Impressions:  - Technically difficult study with poor acoustic windows. Normal LV   size with mild LV hypertrophy. EF 55-60%. Possible apical septal   and apical inferior hypokinesis, but apex was poorly visualized.   Definity contrast may have helped but was not used. Normal RV   size and systolic function. Aortic sclerosis without significant   stenosis.  Laboratory Data:  Chemistry Recent Labs Lab  06/16/17 0358 06/17/17 0457 06/18/17 0330  NA 137 140 141  K 3.5 3.0* 3.3*  CL 108 114* 115*  CO2 19* 19* 21*  GLUCOSE 159* 105* 107*  BUN 48* 36* 28*  CREATININE 1.62* 0.89 0.72  CALCIUM 8.3* 7.8* 7.9*  GFRNONAA 30* >60 >60  GFRAA 34* >60 >60  ANIONGAP 10 7 5      Recent Labs Lab 06/15/17 1222 06/17/17 0457  PROT 6.3* 5.0*  ALBUMIN 3.0* 2.1*  AST 29 22  ALT 17 17  ALKPHOS 102 210*  BILITOT 0.5 0.4   Hematology Recent Labs Lab 06/16/17 0358 06/17/17 0457 06/18/17 0330  WBC 7.4 5.5 5.2  RBC 3.36* 3.09* 3.04*  HGB 10.0* 8.9* 8.9*  HCT 29.2* 26.5* 26.7*  MCV 86.9 85.8 87.8  MCH 29.8 28.8 29.3  MCHC 34.2 33.6 33.3  RDW 14.3 14.7 15.1  PLT 179 173 194   Cardiac Enzymes Recent Labs Lab 06/15/17 1647 06/15/17 2216 06/16/17 0358 06/16/17 0952  TROPONINI 0.06* 0.08* 0.10* 0.10*   No results for input(s): TROPIPOC in the last 168 hours.  BNP Recent Labs Lab 06/15/17 1847  BNP 418.7*    DDimer No results for input(s): DDIMER in the last 168 hours.  Radiology/Studies:  Ct Abdomen Pelvis Wo Contrast  Result Date: 06/16/2017 CLINICAL DATA:  Inpatient. Abdominal pain, tenderness and guarding. Septic shock. EXAM: CT ABDOMEN AND PELVIS WITHOUT CONTRAST TECHNIQUE: Multidetector CT imaging of the abdomen and pelvis was performed following the standard protocol without IV contrast. COMPARISON:  06/15/2017 abdominal radiographs and abdominal sonogram. 04/27/2017 CT abdomen/pelvis. FINDINGS: Examination is limited by motion artifact and by streak artifact from the upper extremities. Lower chest: Dense consolidation with associated volume loss in the dependent lower lobes and lingula, favor atelectasis. Three-vessel coronary atherosclerosis. Tip of superior approach central venous catheter is seen at the cavoatrial junction. Healed deformities are again noted and lower right ribs. Hepatobiliary: Normal liver size. No gross liver mass. Mildly distended gallbladder. No  gallbladder wall thickening. Subcentimeter minimally hyperdense focus in the dependent gallbladder may represent sludge or tiny gallstones. Dilated common bile duct (10 mm diameter), mildly increased from 7 mm on 04/27/2017. No evidence of obstructing biliary mass or radiopaque choledocholithiasis. Pancreas: Normal, with no mass or duct dilation. Spleen: Normal size. No mass. Adrenals/Urinary Tract: Stable 1.9 cm right adrenal and 2.7 cm left adrenal adenomas. Punctate nonobstructing stone versus vascular calcification in the posterior interpolar right renal sinus. No additional potential renal stones. No hydronephrosis. No contour deforming renal masses. Limited bladder visualization due to streak artifact, with no gross bladder abnormality. Stomach/Bowel: Grossly normal stomach. Normal caliber small bowel with no small bowel wall thickening. Normal appendix. There is a moderate to large amount of stool throughout the large bowel and rectum. There is mild wall thickening in the splenic flexure of the colon, descending colon, sigmoid colon and rectum with associated pericolonic fat stranding and ill-defined fluid. No evidence of pneumatosis. No significant colonic diverticulosis. Vascular/Lymphatic: Atherosclerotic nonaneurysmal abdominal aorta. No pathologically enlarged lymph nodes in the abdomen or pelvis. Reproductive: Grossly normal uterus.  No adnexal mass. Other: Trace left paracolic gutter ascites. No focal fluid collection. No pneumoperitoneum. Mild anasarca. Musculoskeletal: No aggressive appearing focal osseous lesions. Partially visualized right total hip arthroplasty. Partially visualized surgical hardware in the bilateral wrists. Chronic mild L1 vertebral compression deformity is stable. Moderate thoracolumbar spondylosis. IMPRESSION: 1. Mild wall thickening in the splenic flexure, descending and sigmoid colon and rectum with associated pericolonic fat stranding, suggestive of a nonspecific infectious  or inflammatory colitis, with the differential including C. diff colitis. No pneumatosis, free air or abscess. 2. Moderate to large colorectal stool volume. 3. Trace left paracolic gutter ascites. 4. Dense consolidation with associated volume loss in the dependent lung bases, favor atelectasis . 5. Mildly distended gallbladder with minimal  sludge versus tiny gallstones. No gallbladder wall thickening. 6. Dilated common bile duct (10 mm diameter), mildly increased. No evidence of obstructing biliary mass or radiopaque choledocholithiasis. Recommend correlation with serum bilirubin levels. 7. Stable bilateral adrenal adenomas. 8.  Aortic Atherosclerosis (ICD10-I70.0). Electronically Signed   By: Ilona Sorrel M.D.   On: 06/16/2017 16:50   Dg Chest 2 View  Result Date: 06/15/2017 CLINICAL DATA:  Recently treated urinary tract infection, hypotension, possible mental status change. History of breast malignancy. EXAM: CHEST  2 VIEW COMPARISON:  Chest x-ray of April 27, 2017 FINDINGS: There is mild hypoinflation of the right lung. The interstitial markings of both lungs are coarse. There is density at the left lung base likely in the lower lobe which appears new. The cardiac silhouette is enlarged. The pulmonary vascularity is not clearly engorged. There is tortuosity of the descending thoracic aorta with calcification in the wall. There are chronic changes of the right shoulder. IMPRESSION: Findings worrisome for atelectasis or pneumonia in the left lower lobe. Followup PA and lateral chest X-ray is recommended in 3-4 weeks following trial of antibiotic therapy to ensure resolution and exclude underlying malignancy. Stable cardiomegaly without pulmonary vascular congestion or pulmonary edema. Thoracic aortic atherosclerosis. Electronically Signed   By: David  Martinique M.D.   On: 06/15/2017 13:17   US Abdomen Complete  Result Date: 06/15/2017 CLINICAL DATA:  Abdominal pain with nausea and vomiting EXAM: ABDOMEN  ULTRASOUND COMPLETE COMPARISON:  Radiograph 04/29/2017, ultrasound 04/28/2017, CT 04/27/2017 FINDINGS: Gallbladder: Small echogenic foci in the gallbladder measuring up to 11 mm but without much shadowing, could represent small amount of sludge or small stones. Negative sonographic Murphy. Normal wall thickness. Common bile duct: Diameter: Slightly enlarged, measuring up to 1 cm, but similar compared with prior ultrasound from June. Liver: Increased echogenicity.  No focal abnormality is seen. IVC: Limited visualization.  No significant abnormality. Pancreas: Visualized portion unremarkable. Spleen: Size and appearance within normal limits. Right Kidney: Length: 12.9 cm. Echogenicity within normal limits. No mass or hydronephrosis visualized. Left Kidney: Length: 12 cm. Echogenicity within normal limits. No mass or hydronephrosis visualized. Abdominal aorta: Proximal aorta 2.4 cm. Mid to distal aorta obscured by bowel gas Other findings: None. IMPRESSION: 1. Small linear echogenic focus in the gallbladder could relate to a small amount of sludge or possible small stones. Normal wall thickness and negative sonographic Murphy. 2. Slightly enlarged common bowel duct up to 1 cm but without change since prior ultrasound from June 2018. Correlation with laboratory values previously suggested 3. Hepatic steatosis Electronically Signed   By: Donavan Foil M.D.   On: 06/15/2017 18:05   Dg Chest Port 1 View  Result Date: 06/18/2017 CLINICAL DATA:  Respiratory failure. EXAM: PORTABLE CHEST 1 VIEW COMPARISON:  06/17/2017. FINDINGS: Right IJ catheter again noted with tip in the right atrium. Heart size stable. Persistent right upper lobe infiltrate, progressed from prior exam. Findings consist with pneumonia. Mild left base subsegmental atelectasis. No pleural effusion pneumothorax. Surgical clips right chest. IMPRESSION: 1. Right IJ catheter again noted with tip in the right atrium . 2. Progressive right upper lobe  infiltrate consistent with pneumonia. 3. Mild left base subsegmental atelectasis. Electronically Signed   By: Marcello Moores  Register   On: 06/18/2017 07:07   Dg Chest Port 1 View  Result Date: 06/17/2017 CLINICAL DATA:  Respiratory failure. EXAM: PORTABLE CHEST 1 VIEW COMPARISON:  06/15/2017. FINDINGS: Right IJ line noted with its tip projected over the right atrium. Heart size normal. Right upper lobe mild  infiltrate. Progressive left lower lobe atelectasis. Tiny left pleural effusion cannot be excluded . No pneumothorax. Surgical clips right chest. IMPRESSION: 1. Right IJ line noted with its tip over the right atrium. No pneumothorax. 2. New onset mild right upper lobe infiltrate suggesting pneumonia. Progressive lower lobe atelectasis. Electronically Signed   By: Marcello Moores  Register   On: 06/17/2017 06:56   Dg Chest Port 1 View  Result Date: 06/15/2017 CLINICAL DATA:  Sepsis. EXAM: PORTABLE ABDOMEN - 1 VIEW; PORTABLE CHEST - 1 VIEW COMPARISON:  Chest x-ray 06/15/2017 and abdominal films 04/27/2017 FINDINGS: The portable semi upright chest film demonstrates normal heart size. Stable tortuosity and calcification of the thoracic aorta. Right IJ center venous catheter tip is in the distal SVC. Low lung volumes with vascular crowding and streaky basilar atelectasis. No definite effusions or infiltrates Two views of the abdomen demonstrate air filled colon with scattered stool down to the rectum. No findings for small bowel obstruction or free air. The soft tissue shadows are grossly maintained. Stable vascular calcifications. IMPRESSION: No acute cardiopulmonary findings. Low lung volumes with vascular crowding and streaky bibasilar atelectasis. No plain film findings for an acute abdominal process. Probable mild colonic ileus and constipation. Electronically Signed   By: Marijo Sanes M.D.   On: 06/15/2017 18:27   Dg Abd Portable 1v  Result Date: 06/15/2017 CLINICAL DATA:  Sepsis. EXAM: PORTABLE ABDOMEN - 1 VIEW;  PORTABLE CHEST - 1 VIEW COMPARISON:  Chest x-ray 06/15/2017 and abdominal films 04/27/2017 FINDINGS: The portable semi upright chest film demonstrates normal heart size. Stable tortuosity and calcification of the thoracic aorta. Right IJ center venous catheter tip is in the distal SVC. Low lung volumes with vascular crowding and streaky basilar atelectasis. No definite effusions or infiltrates Two views of the abdomen demonstrate air filled colon with scattered stool down to the rectum. No findings for small bowel obstruction or free air. The soft tissue shadows are grossly maintained. Stable vascular calcifications. IMPRESSION: No acute cardiopulmonary findings. Low lung volumes with vascular crowding and streaky bibasilar atelectasis. No plain film findings for an acute abdominal process. Probable mild colonic ileus and constipation. Electronically Signed   By: Marijo Sanes M.D.   On: 06/15/2017 18:27    Assessment and Plan:   Acute respiratory distress- CHF vs HCAP- Lasix given, CCM following  Abnormal echo- R/O MI, overall LVF still preserved  LBBB- Old  Debilitation- SNF pt  Septic shock- Admitted 06/15/17 with septic shock. Christy [name] required IVFs- I/O + 12L  Plan: Cycle Troponin. Diurese. We will follow.   Signed, Kerin Ransom, PA-C  06/18/2017 10:21 AM   The patient was seen, examined and discussed with Kerin Ransom, PA-C and I agree with the above.   76 y/o [name] female who was seen by Dr Acie Fredrickson May 2018 after Christy [name] had a fall coming out of church and suffered bilateral wrist fractures as well as a fractured Lt patella.  Dr Acie Fredrickson saw her in consult for an elevated Troponin (0.21). Her echo in May showed normal LVF (no WMA). Readmitted from SNF, on 06/15/17 with sepsis with AKI (SCr 3.0) felt to be secondary to UTI. Christy [name] is now being transferred to ICU because of worsening respiratory failure. We are consulted for elevated troponin 0.1. Echo shows LVEF 55-60%, no clear WMA, just poorly  visualized apical segments.  The patient is an elderly female with multiorgan failure. Troponin elevation minimal, now downtrending, most probably sec to acute kidney failure, acute respiratory failure and severe anemia. There is  no need for ischemia evaluation in the acute settings. IF Christy [name] recovers from an acute illness, we will consider performing a stress test, possibly an outpatient.   Ena Dawley, MD 06/18/2017

## 2017-06-18 NOTE — Progress Notes (Signed)
PT Cancellation Note  Patient Details Name: Christy [name] MRN: [id number] DOB: 08-16-1941   Cancelled Treatment:    Reason Eval/Treat Not Completed: Medical issues which prohibited therapy (acute respiratory distress, to be transferred to SDU)   Pegge Cumberledge,KATHrine E 06/18/2017, 10:37 AM Carmelia Bake, PT, DPT 06/18/2017 Pager: [phone]

## 2017-06-18 NOTE — Clinical Social Work Note (Signed)
Clinical Social Work Assessment  Patient Details  Name: Christy [name] MRN: [id number] Date of Birth: [birth year]  Date of referral:  06/18/17               Reason for consult:  Facility Placement ([facility] )                Permission sought to share information with:    Permission granted to share information::     Name::      Alycia Rossetti   Agency::  [employer] [employer]   Relationship::     Contact Information:    [phone]  Housing/Transportation Living arrangements for the past 2 months:  [place] of Information:  Adult Children (Daughter ) Patient Interpreter Needed:  Other  (speaks [place] language) Criminal Activity/Legal Involvement Pertinent to Current Situation/Hospitalization:  No  Significant Relationships:  Adult Children, Warehouse manager Lives with:  Facility Resident Do you feel safe going back to the place where you live?  Yes Need for family participation in patient care:  No   Care giving concerns: Patient will return to SNF[facility] facility.    Social Worker assessment / plan:  CSW spoke to patient daughter. She reports patient is from  [facility] and patient will return at discharge.  Plan: Assist with d/c back to [facility]  Employment status:  Retired Forensic scientist:  Medicare PT Recommendations:  [place] / Referral to community resources:  [place]  Patient/Family's Response to care:  Agreeable and responding well care.   Patient/Family's Understanding of and Emotional Response to Diagnosis, Current Treatment, and Prognosis:  " Please have nurse call me with updates."Patient daughter very involved in care and request to be updated about pt. Current treatment.   Emotional Assessment Appearance:  Appears stated age Attitude/Demeanor/Rapport:    Affect (typically observed):  Calm Orientation:  Oriented to Self Alcohol / Substance use:  Not Applicable Psych  involvement (Current and /or in the community):  No (Comment)  Discharge Needs  Concerns to be addressed:    Readmission within the last 30 days:  No Current discharge risk:  Dependent with Mobility Barriers to Discharge:  Continued Medical Work up   [employer], LCSW 06/18/2017, 9:58 AM

## 2017-06-18 NOTE — Consult Note (Addendum)
[facility] Nurse wound consult note Reason for Consult:sacral ulcer stage I, MASD left medial buttock, stage II lateral left heel Wound type:pressure, MASD Pressure Injury POA: Yes, daughter present and agrees all were present PTA Measurement: left lateral heel 3cm circle x 0.1cm 100% pink wound bed no drainage or odor. Sacrum has 5cm x 3cm stage I with a MASD partial thickness wound on left medial buttock that measures 1.5cm circle, no drainage or odor Wound bed: see above Drainage (amount, consistency, odor) see above Periwound: intact Dressing procedure/placement/frequency: I have provided nurses with orders for Bilateral Prevalon boots, purple top criticaid ointment to MASD areas, foam dressing to sacrum for prevention.  Pt is on a low air loss mattress. We will not follow, but will remain available to this patient, to nursing, and the medical and/or surgical teams.  Please re-consult if we need to assist further.   Fara Olden, RN-C, WTA-C, [facility] Wound Treatment Associate

## 2017-06-18 NOTE — Progress Notes (Signed)
Pharmacy Antibiotic Note  Christy [name] is a 76 y.o. female admitted on 06/15/2017 with AMS and UTI. She was recently treated for ESBL UTI infection & on Bactrim per nursing home [facility]. Admitted with Klebsiella UTI and septic shock, failure to respond to outpatient Bactrim She improved, came off pressors and was transferred to the floor 8/16. Developed acute respiratory distress 8/17 AM . Chest x-ray shows right upper lobe consolidation which is new since admission.  Vancomycin added back 8/17 to cover enterococcus.  Day #4 merrem for ESBL Klebsiella.  WBC WNL, creat WNL, Wt 91.5 kg.   Plan: Vancomycin 1000 mg IV every 12 hours.  Goal trough 15-20 mcg/mL.  Continue meropenem 1 gm IV q8h Monitor renal function, cultures, WBC, temp Vancomycin levels as needed  Height: 5\' 7"  (170.2 cm) Weight: 201 lb 11.5 oz (91.5 kg) IBW/kg (Calculated) : 61.6  Temp (24hrs), Avg:98.4 F (36.9 C), Min:97.7 F (36.5 C), Max:99.5 F (37.5 C)   Recent Labs Lab 06/15/17 1222 06/15/17 1233 06/15/17 1647 06/15/17 1708 06/15/17 1847 06/15/17 2216 06/16/17 0358 06/16/17 0402 06/17/17 0457 06/18/17 0330  WBC 15.5*  --  11.2*  --   --   --  7.4  --  5.5 5.2  CREATININE 3.00*  --  2.37*  --  2.30*  --  1.62*  --  0.89 0.72  LATICACIDVEN  --  7.37*  --  3.50* 3.1* 2.8*  --  1.6  --   --     Estimated Creatinine Clearance: 69.5 mL/min (by C-G formula based on SCr of 0.72 mg/dL).    No Known Allergies Antimicrobials this admission: 8/14 Invanz x1 8/14 Vanc >> 8/16  8/17>> 8/14 Meropenem >>    Dose adjustments this admission: 8/15 increase meropenem from 500mg  to 1g q12h, vanc 1g to 1250mg  q24h for improved SCr 8/16 increase from 1g q12h to 1g q8h for improved SCr Microbiology results: 8/14 BCx: ngtd 8/14 UCx: >100k Klebsiella pneumoniae                  >100k Enterococcus faecalis 8/14 MRSA PCR: positive 6/26 UCx: ESBL Klebsiella -only sens imipenem 6/3 UCx: Enterococcus faecalis: S=Amp,  Nirtrofurantoin, Vanc, I=Levofloxacin  Thank you for allowing pharmacy to be a part of this patient's care.  Eudelia Bunch, Pharm.D. [phone] 06/18/2017 10:48 AM

## 2017-06-18 NOTE — Progress Notes (Signed)
PULMONARY / CRITICAL CARE MEDICINE   Name: Christy [name] MRN: [id number] DOB: 1941-09-28    ADMISSION DATE:  06/15/2017 CONSULTATION DATE:  8/14  REFERRING MD:  Alvino Chapel   CHIEF COMPLAINT:  Septic shock  HISTORY OF PRESENT ILLNESS:   76 yo [facility] patient (resides there after knee surgery and deconditioning that followed since June 2018). Has h/u ESBL Klebsiella PNA UTI. Was in [place] until about 10d ago. Got weak, c/o RLQ discomfort. Treated for UTI which cultures again grew out ESBL K PNA. She was treated w/ bactrim at that time IV for 2 d then converted to PO. Initially felt better. Then was found 8/14 by nursing staff w/ decreased LOC, hypotensive w/ SBP in 60-70s. EMS called. Sepsis protocol initiated. LA > 7. Was given 30 ml/kg fluid challenge by ER team, remained hypotensive but MS improved some. Cultures were obtained, abx started. PCCM asked to admit.   SUBJECTIVE:   Increased WOB  VITAL SIGNS: BP 126/66 (BP Location: Right Arm)   Pulse 91   Temp 98.1 F (36.7 C) (Oral)   Resp 20   Ht 5\' 7"  (1.702 m)   Wt 201 lb 11.5 oz (91.5 kg)   SpO2 93%   BMI 31.59 kg/m   HEMODYNAMICS: CVP:  [4 mmHg-6 mmHg] 6 mmHg  VENTILATOR SETTINGS:    INTAKE / OUTPUT:  Intake/Output Summary (Last 24 hours) at 06/18/17 3532 Last data filed at 06/17/17 1300  Gross per 24 hour  Intake           440.42 ml  Output              200 ml  Net           240.42 ml     PHYSICAL EXAMINATION: General appearance:  Chronically ill appearing 76 Year old  female, , currently in mild distress  conversant  Eyes: anicteric sclerae, moist conjunctivae; PERRL, EOMI bilaterally. Mouth:  membranes and no mucosal ulcerations; normal hard and soft palate Neck: Trachea midline; neck supple, no JVD Lungs/chest: course scattered and diffuse rhonchi, with increased respiratory effort  CV: RRR, no MRGs  Abdomen: Soft, non-tender; no masses or HSM Extremities: trace peripheral edema Skin: Normal temperature, turgor  and texture; no rash, ulcers or subcutaneous nodules Psych:baseline affect, alert and follows commands  LABS:  BMET  Recent Labs Lab 06/16/17 0358 06/17/17 0457 06/18/17 0330  NA 137 140 141  K 3.5 3.0* 3.3*  CL 108 114* 115*  CO2 19* 19* 21*  BUN 48* 36* 28*  CREATININE 1.62* 0.89 0.72  GLUCOSE 159* 105* 107*    Electrolytes  Recent Labs Lab 06/16/17 0358 06/17/17 0457 06/18/17 0330  CALCIUM 8.3* 7.8* 7.9*  MG 1.5* 1.7 1.7  PHOS 3.8 2.8  --     CBC  Recent Labs Lab 06/16/17 0358 06/17/17 0457 06/18/17 0330  WBC 7.4 5.5 5.2  HGB 10.0* 8.9* 8.9*  HCT 29.2* 26.5* 26.7*  PLT 179 173 194    Coag's  Recent Labs Lab 06/15/17 1647  APTT 33  INR 1.18    Sepsis Markers  Recent Labs Lab 06/15/17 1647  06/15/17 1847 06/15/17 2216 06/16/17 0358 06/16/17 0402 06/17/17 0457  LATICACIDVEN  --   < > 3.1* 2.8*  --  1.6  --   PROCALCITON 7.36  --   --   --  6.29  --  3.73  < > = values in this interval not displayed.  ABG  Recent Labs Lab 06/15/17 1650  PHART  7.342*  PCO2ART 29.7*  PO2ART 67.9*    Liver Enzymes  Recent Labs Lab 06/15/17 1222 06/17/17 0457  AST 29 22  ALT 17 17  ALKPHOS 102 210*  BILITOT 0.5 0.4  ALBUMIN 3.0* 2.1*    Cardiac Enzymes  Recent Labs Lab 06/15/17 2216 06/16/17 0358 06/16/17 0952  TROPONINI 0.08* 0.10* 0.10*    Glucose  Recent Labs Lab 06/17/17 1623 06/17/17 1719 06/17/17 2032 06/18/17 0004 06/18/17 0407 06/18/17 0823  GLUCAP 69 109* 82 62* 90 133*    Imaging Dg Chest Port 1 View  Result Date: 06/18/2017 CLINICAL DATA:  Respiratory failure. EXAM: PORTABLE CHEST 1 VIEW COMPARISON:  06/17/2017. FINDINGS: Right IJ catheter again noted with tip in the right atrium. Heart size stable. Persistent right upper lobe infiltrate, progressed from prior exam. Findings consist with pneumonia. Mild left base subsegmental atelectasis. No pleural effusion pneumothorax. Surgical clips right chest. IMPRESSION:  1. Right IJ catheter again noted with tip in the right atrium . 2. Progressive right upper lobe infiltrate consistent with pneumonia. 3. Mild left base subsegmental atelectasis. Electronically Signed   By: Marcello Moores  Register   On: 06/18/2017 07:07     STUDIES:  AbdUS 8/14 >> small echogenic focus in GB.  Slightly enlarged CBD up to 1cm.  No sonographic Murphy's.  Hepatic steatosis. CT A / P 8/15 >>mild wall thickening in the splenic flexure, descending & sigmoid colon & rectum with associated fat stranding (suggestive of nonspecific infectious / inflammatory colitis) no free air or abscess.  Moderate to large colorectal stool volume, dense consolidation with volume loss in the dependent bases (favor atelectasis).  Mildly distended gallbladder with minimal sludge vs tiny gallstones, dilated common bile duct without evidence of obstructing biliary mass  CULTURES: BCX 2 8/14 >>neg  UC 8/14 >> K.PNA (MDR but not ESBL producer), E.Faecalis   ANTIBIOTICS: Merrem 8/14 >> vanc 8/14 >>  SIGNIFICANT EVENTS: 8/14  Admit 8/15  Remains on 91mcg/min levophed.  CVP 7 this morning. C/o moderate abdominal pain with guarding. 8/16  Off pressors, VSS 8/17 PCCM to see for increased WOB LINES/TUBES:   DISCUSSION: Septic shock -resolved. Growing both E Faecalis and K/.PNA in urine. More SOB this am. CXR w/ progression of RUL airspace disease. We have also been covering for HCAP. For today. SDU monitoring reasonable. Will cont abx. Get BNP, give lasix. Making her NPO except meds. Hope we can avoid further escalation  ASSESSMENT / PLAN:  Acute respiratory distress.  HCAP vs Atelectasis  PCXR personally reviewed: Has new/progressive RUL infiltrate. Certainly has room for diuresis  Plan Lasix x1 Add back vanc for HCAP coverage SDU setting reasonable Wean Oxygen   Sepsis in setting of MDR Klebsiella PNA and E Faecalis UTI Possible HCAP ->shock resolved Plan Cont Merrem (started 8/14) Resume Vanc  (stopped 8/16 and resume 8/17)  LBBB, mild trop I elevation.  LVEF 55-60% possible anterior septal and apical inferior hypokinesis (new since May). I do not think she is cardiac cath candidate.  Plan Cont tele  Cont asa Repeat Trops  Will need cards eval as ECHO seems to have new WM changes c/w May 2018.   AKI - improving/ resolved Hypokalemia & hyperchloremia  Lactic acidosis - resolved. P:   KVO IVFs given resp distress Replace K F/u am chem When able to take POs hyperchloremia should regulate on own   Abd pain - see CT results above.  Intraabdominal process vs referred from UTI? Amylase/lipase negative.  H/o constipation  P:  NPO for now given increased WOB  Anemia of chronic disease VTE prophylaxis. P:  [place] heparin Transfuse per protocol    DM w/ hyperglycemia & hypoglycemia  P:   Ssi, hold home meds   Acute encephalopathy c/b primary language barrier  Recent left knee surgery (6 weeks ago) P:   Holding sedating meds Supportive care  FAMILY  - Updates:  No family at bedside am 8/16.  Unable to find patient language on translation program per RN.    My cct 1 min  [name] ACNP-BC [place] Pager # [phone] OR # [phone] if no answer  06/18/2017, 9:08 AM

## 2017-06-19 ENCOUNTER — Inpatient Hospital Stay (HOSPITAL_COMMUNITY): Payer: Medicare Other

## 2017-06-19 LAB — CBC
HEMATOCRIT: 27.8 % — AB (ref 36.0–46.0)
Hemoglobin: 9.1 g/dL — ABNORMAL LOW (ref 12.0–15.0)
MCH: 29.2 pg (ref 26.0–34.0)
MCHC: 32.7 g/dL (ref 30.0–36.0)
MCV: 89.1 fL (ref 78.0–100.0)
Platelets: 248 10*3/uL (ref 150–400)
RBC: 3.12 MIL/uL — AB (ref 3.87–5.11)
RDW: 15.4 % (ref 11.5–15.5)
WBC: 6.2 10*3/uL (ref 4.0–10.5)

## 2017-06-19 LAB — BASIC METABOLIC PANEL
ANION GAP: 6 (ref 5–15)
BUN: 24 mg/dL — ABNORMAL HIGH (ref 6–20)
CO2: 24 mmol/L (ref 22–32)
Calcium: 8.1 mg/dL — ABNORMAL LOW (ref 8.9–10.3)
Chloride: 113 mmol/L — ABNORMAL HIGH (ref 101–111)
Creatinine, Ser: 0.71 mg/dL (ref 0.44–1.00)
GFR calc Af Amer: >60 mL/min (ref >60)
GLUCOSE: 148 mg/dL — AB (ref 65–99)
POTASSIUM: 3.2 mmol/L — AB (ref 3.5–5.1)
Sodium: 143 mmol/L (ref 135–145)

## 2017-06-19 LAB — BLOOD GAS, ARTERIAL
ACID-BASE DEFICIT: 5.5 mmol/L — AB (ref 0.0–2.0)
BICARBONATE: 20.8 mmol/L (ref 20.0–28.0)
Drawn by: 404151
FIO2: 100
O2 SAT: 96.9 %
PCO2 ART: 47.2 mmHg (ref 32.0–48.0)
PH ART: 7.266 — AB (ref 7.350–7.450)
PO2 ART: 101 mmHg (ref 83.0–108.0)
Patient temperature: 98.6

## 2017-06-19 LAB — GLUCOSE, CAPILLARY
GLUCOSE-CAPILLARY: 127 mg/dL — AB (ref 65–99)
GLUCOSE-CAPILLARY: 150 mg/dL — AB (ref 65–99)
GLUCOSE-CAPILLARY: 152 mg/dL — AB (ref 65–99)
GLUCOSE-CAPILLARY: 99 mg/dL (ref 65–99)
Glucose-Capillary: 135 mg/dL — ABNORMAL HIGH (ref 65–99)
Glucose-Capillary: 153 mg/dL — ABNORMAL HIGH (ref 65–99)
Glucose-Capillary: 101 mg/dL — ABNORMAL HIGH (ref 65–99)

## 2017-06-19 LAB — HEPATIC FUNCTION PANEL
ALBUMIN: 1.9 g/dL — AB (ref 3.5–5.0)
ALT: 18 U/L (ref 14–54)
AST: 19 U/L (ref 15–41)
Alkaline Phosphatase: 181 U/L — ABNORMAL HIGH (ref 38–126)
BILIRUBIN TOTAL: 0.7 mg/dL (ref 0.3–1.2)
Bilirubin, Direct: 0.2 mg/dL (ref 0.1–0.5)
Indirect Bilirubin: 0.5 mg/dL (ref 0.3–0.9)
Total Protein: 5.2 g/dL — ABNORMAL LOW (ref 6.5–8.1)

## 2017-06-19 LAB — TROPONIN I: Troponin I: 0.03 ng/mL (ref <0.03)

## 2017-06-19 MED ORDER — FUROSEMIDE 10 MG/ML IJ SOLN
40.0000 mg | Freq: Once | INTRAMUSCULAR | Status: AC
  Administered 2017-06-19: 40 mg via INTRAVENOUS
  Filled 2017-06-19: qty 4

## 2017-06-19 MED ORDER — BISACODYL 10 MG RE SUPP: 10.0000 mg | Freq: Every day | RECTAL | Status: DC | PRN

## 2017-06-19 MED ORDER — POTASSIUM CHLORIDE 10 MEQ/100ML IV SOLN
10.0000 meq | INTRAVENOUS | Status: AC
  Administered 2017-06-19 (×3): 10 meq via INTRAVENOUS
  Filled 2017-06-19 (×3): qty 100

## 2017-06-19 MED ORDER — FUROSEMIDE 10 MG/ML IJ SOLN
40.0000 mg | Freq: Two times a day (BID) | INTRAMUSCULAR | Status: DC
  Administered 2017-06-19 – 2017-06-20 (×4): 40 mg via INTRAVENOUS
  Filled 2017-06-19 (×4): qty 4

## 2017-06-19 NOTE — Progress Notes (Signed)
[facility] Progress Note Patient Name: Tashanna Dolin DOB: 28-Jul-1941 MRN: [id number]   Date of Service  06/19/2017  HPI/Events of Note  CXR from earlier - little change. Lasix repeated x 1 ABG > evolving resp acidosis.   eICU Interventions  Try BiPAP, reassess      Intervention Category Intermediate Interventions: Respiratory distress - evaluation and management  Melquan Ernsberger S. 06/19/2017, 5:39 AM

## 2017-06-19 NOTE — Progress Notes (Signed)
RT unable to obtain ABG. RT x 2 attempted to obtain sample. MD aware.

## 2017-06-19 NOTE — Progress Notes (Addendum)
Patient ID: Christy [name], female   DOB: 06/04/1941, 76 y.o.   MRN: [id number]  PROGRESS NOTE    Christy [name]  CHE:[id number] DOB: 1941-07-15 DOA: 06/15/2017  PCP: [name], MD   Brief Narrative:  76 year old female (speaks [name] language), from NH after knee surgery and deconditioning that followed in 04/2017, has history of ESBL Klebsiella pneumoniae UTI. She started to experience right lower quadrant abdominal pain. Her cx continued to grow ESBL and she was treated with IV bactrim which was then changed to PO. On 8/14, she was found by NH staff with decreased consciousness, hypotensive with SBP in 60-70's. Lactic was was 7. She remained hypotensive with fluid challenge so she required pressor support. Pt transitioned to [facility] care 8/17. Pt more rhonchorous on 8/17, transferred to SDU due to increased work of breathing. CCM consulted. CXR showed right upper lung lobe consolidation which is new since the admission. This am, pt on BIPAP.   Significant event: 8/15 - on Levophed 8/16 - off pressor support 8/17 - transferred back to SDU 8/18 - BiPAP  Assessment & Plan:   Active Problems:   Septic shock (HCC) / UTI Klebsiella - Septic shock on admission requiring pressor support from 8/14 - 8/16 - Urine cx grew more than 100K Klebsiella pneumoniae  - We will continue meropenem    Acute respiratory failure with hypoxia / HCAP - CXR showed progressive RUL infiltrate consistent with penumonia - Continue vancomycin - CCM following     Acute kidney injury / Hypokalemia - Due to septic shock - Cr normalized     Hypokalemia - Due to sepsis - Supplemented    Abdominal pain - Pt has had CT scan on admission which showed possible inflammatory or infectious colitis and also dilated common bile duct mildly increased - No diarrhea but she has had issues with constipation in past - Has had 2 BM in past 24 hours - Will obtain LFT's - May need GI eval - Amylase, lipase  negative    Mild troponin elevation - Likely in the setting of septic shock, levels consistent with demand ischemia - Cardio has seen pt in consultation, ischemic work up not required at this time     Acute metabolic encephalopathy - Much better, I also think it is because she speaks different language and does not understand English    Protein-calorie malnutrition, severe - In the context of acute on chronic illness - Now with BiPAP so not eating    Stage 1 sacral ulcer and stage 2 lateral left heel - Appreciate WOC assessment    DVT prophylaxis:  SCD's Code Status: full code  Family Communication: spoke with daughter over the phone this am Disposition Plan: remains in SDU   Consultants:   PCCM  Cardiology  Procedures:   BiPAP  Antimicrobials:   Merem 8/14 -->  Vanco 8/14 -->   Subjective: On BiPAP this am.   Objective: Vitals:   06/19/17 0800 06/19/17 0850 06/19/17 0856 06/19/17 0900  BP: (!) 142/109 133/61  (!) 141/72  Pulse: 89 85 93 89  Resp: (!) 23 (!) 23 16 (!) 27  Temp: 98.1 F (36.7 C)     TempSrc: Axillary     SpO2: 99% 99% 97% 95%  Weight:      Height:        Intake/Output Summary (Last 24 hours) at 06/19/17 0957 Last data filed at 06/19/17 0600  Gross per 24 hour  Intake  540 ml  Output             1000 ml  Net             -460 ml   Filed Weights   06/18/17 0542 06/18/17 1130 06/19/17 0502  Weight: 91.5 kg (201 lb 11.5 oz) 93.5 kg (206 lb 2.1 oz) 91.8 kg (202 lb 6.1 oz)    Physical Exam  Constitutional: Appears well-developed and well-nourished. No distress.  CVS: RRR, S1/S2 +, Pulmonary: rhonchorous, diminished BS Abdominal: Soft. BS +,  no distension, tenderness, rebound or guarding.  Musculoskeletal: No tenderness, palpable pulses  Lymphadenopathy: No lymphadenopathy noted, cervical, inguinal. Neuro: No focal deficits  Skin: has una boots Psychiatric: Not restless or agitated    Data Reviewed: I have  personally reviewed following labs and imaging studies  CBC:  Recent Labs Lab 06/15/17 1222 06/15/17 1647 06/16/17 0358 06/17/17 0457 06/18/17 0330 06/19/17 0801  WBC 15.5* 11.2* 7.4 5.5 5.2 6.2  NEUTROABS 13.4*  --   --   --   --   --   HGB 11.4* 10.0* 10.0* 8.9* 8.9* 9.1*  HCT 34.7* 30.4* 29.2* 26.5* 26.7* 27.8*  MCV 87.6 86.6 86.9 85.8 87.8 89.1  PLT 176 170 179 173 194 081   Basic Metabolic Panel:  Recent Labs Lab 06/15/17 1847 06/16/17 0358 06/17/17 0457 06/18/17 0330 06/19/17 0801  NA 137 137 140 141 143  K 3.8 3.5 3.0* 3.3* 3.2*  CL 108 108 114* 115* 113*  CO2 18* 19* 19* 21* 24  GLUCOSE 220* 159* 105* 107* 148*  BUN 54* 48* 36* 28* 24*  CREATININE 2.30* 1.62* 0.89 0.72 0.71  CALCIUM 8.6* 8.3* 7.8* 7.9* 8.1*  MG  --  1.5* 1.7 1.7  --   PHOS  --  3.8 2.8  --   --    GFR: Estimated Creatinine Clearance: 69.6 mL/min (by C-G formula based on SCr of 0.71 mg/dL). Liver Function Tests:  Recent Labs Lab 06/15/17 1222 06/17/17 0457  AST 29 22  ALT 17 17  ALKPHOS 102 210*  BILITOT 0.5 0.4  PROT 6.3* 5.0*  ALBUMIN 3.0* 2.1*    Recent Labs Lab 06/16/17 0952  LIPASE 18  AMYLASE 85   No results for input(s): AMMONIA in the last 168 hours. Coagulation Profile:  Recent Labs Lab 06/15/17 1647  INR 1.18   Cardiac Enzymes:  Recent Labs Lab 06/16/17 0358 06/16/17 0952 06/18/17 1014 06/18/17 1530 06/18/17 2345  TROPONINI 0.10* 0.10* 0.05* 0.04* 0.03*   BNP (last 3 results) No results for input(s): PROBNP in the last 8760 hours. HbA1C: No results for input(s): HGBA1C in the last 72 hours. CBG:  Recent Labs Lab 06/18/17 1257 06/18/17 1627 06/18/17 1947 06/19/17 0436 06/19/17 0753  GLUCAP 133* 102* 136* 150* 135*   Lipid Profile: No results for input(s): CHOL, HDL, LDLCALC, TRIG, CHOLHDL, LDLDIRECT in the last 72 hours. Thyroid Function Tests: No results for input(s): TSH, T4TOTAL, FREET4, T3FREE, THYROIDAB in the last 72 hours. Anemia  Panel: No results for input(s): VITAMINB12, FOLATE, FERRITIN, TIBC, IRON, RETICCTPCT in the last 72 hours. Urine analysis:    Component Value Date/Time   COLORURINE RED (A) 06/15/2017 1320   APPEARANCEUR CLOUDY (A) 06/15/2017 1320   LABSPEC 1.013 06/15/2017 1320   PHURINE 7.0 06/15/2017 1320   GLUCOSEU NEGATIVE 06/15/2017 1320   HGBUR SMALL (A) 06/15/2017 1320   BILIRUBINUR NEGATIVE 06/15/2017 [place] 06/15/2017 1320   PROTEINUR NEGATIVE 06/15/2017 1320  UROBILINOGEN 0.2 11/27/2013 2012   NITRITE NEGATIVE 06/15/2017 1320   LEUKOCYTESUR TRACE (A) 06/15/2017 1320   Sepsis Labs: @LABRCNTIP (procalcitonin:4,lacticidven:4)   ) Recent Results (from the past 240 hour(s))  Urine culture     Status: Abnormal   Collection Time: 06/15/17 12:18 PM  Result Value Ref Range Status   Specimen Description URINE, CATHETERIZED  Final   Special Requests NONE  Final   Culture (A)  Final    >=100,000 COLONIES/mL KLEBSIELLA PNEUMONIAE Confirmed Extended Spectrum Beta-Lactamase Producer (ESBL) >=100,000 COLONIES/mL ENTEROCOCCUS FAECALIS    Report Status 06/18/2017 FINAL  Final   Organism ID, Bacteria KLEBSIELLA PNEUMONIAE (A)  Final   Organism ID, Bacteria ENTEROCOCCUS FAECALIS (A)  Final      Susceptibility   Enterococcus faecalis - MIC*    AMPICILLIN <=2 SENSITIVE Sensitive     LEVOFLOXACIN >=8 RESISTANT Resistant     NITROFURANTOIN <=16 SENSITIVE Sensitive     VANCOMYCIN 1 SENSITIVE Sensitive     * >=100,000 COLONIES/mL ENTEROCOCCUS FAECALIS   Klebsiella pneumoniae - MIC*    AMPICILLIN >=32 RESISTANT Resistant     CEFAZOLIN >=64 RESISTANT Resistant     CEFTRIAXONE >=64 RESISTANT Resistant     CIPROFLOXACIN >=4 RESISTANT Resistant     GENTAMICIN >=16 RESISTANT Resistant     IMIPENEM <=0.25 SENSITIVE Sensitive     NITROFURANTOIN 64 INTERMEDIATE Intermediate     TRIMETH/SULFA >=320 RESISTANT Resistant     AMPICILLIN/SULBACTAM >=32 RESISTANT Resistant     PIP/TAZO >=128  RESISTANT Resistant     Extended ESBL POSITIVE Resistant     * >=100,000 COLONIES/mL KLEBSIELLA PNEUMONIAE  Blood Culture (routine x 2)     Status: None (Preliminary result)   Collection Time: 06/15/17 12:22 PM  Result Value Ref Range Status   Specimen Description BLOOD LEFT ANTECUBITAL  Final   Special Requests   Final    BOTTLES DRAWN AEROBIC AND ANAEROBIC Blood Culture adequate volume   Culture   Final    NO GROWTH 3 DAYS Performed at [facility], [address], [place], [place] [address]    Report Status PENDING  Incomplete  Blood Culture (routine x 2)     Status: None (Preliminary result)   Collection Time: 06/15/17 12:46 PM  Result Value Ref Range Status   Specimen Description BLOOD RIGHT ANTECUBITAL  Final   Special Requests   Final    BOTTLES DRAWN AEROBIC AND ANAEROBIC Blood Culture adequate volume   Culture   Final    NO GROWTH 3 DAYS Performed at [facility], [place] [address], [place], [place] [address]    Report Status PENDING  Incomplete  MRSA PCR Screening     Status: Abnormal   Collection Time: 06/15/17  6:30 PM  Result Value Ref Range Status   MRSA by PCR POSITIVE (A) NEGATIVE Final    Comment:        The GeneXpert MRSA Assay (FDA approved for NASAL specimens only), is one component of a comprehensive MRSA colonization surveillance program. It is not intended to diagnose MRSA infection nor to guide or monitor treatment for MRSA infections. RESULT CALLED TO, READ BACK BY AND VERIFIED WITH: Audree Camel [id number] @ 2205 BY J SCOTTON       Radiology Studies: Ct Abdomen Pelvis Wo Contrast  Result Date: 06/16/2017 CLINICAL DATA:  Inpatient. Abdominal pain, tenderness and guarding. Septic shock. EXAM: CT ABDOMEN AND PELVIS WITHOUT CONTRAST TECHNIQUE: Multidetector CT imaging of the abdomen and pelvis was performed following the  standard protocol without IV contrast. COMPARISON:  06/15/2017 abdominal radiographs and abdominal sonogram. 04/27/2017  CT abdomen/pelvis. FINDINGS: Examination is limited by motion artifact and by streak artifact from the upper extremities. Lower chest: Dense consolidation with associated volume loss in the dependent lower lobes and lingula, favor atelectasis. Three-vessel coronary atherosclerosis. Tip of superior approach central venous catheter is seen at the cavoatrial junction. Healed deformities are again noted and lower right ribs. Hepatobiliary: Normal liver size. No gross liver mass. Mildly distended gallbladder. No gallbladder wall thickening. Subcentimeter minimally hyperdense focus in the dependent gallbladder may represent sludge or tiny gallstones. Dilated common bile duct (10 mm diameter), mildly increased from 7 mm on 04/27/2017. No evidence of obstructing biliary mass or radiopaque choledocholithiasis. Pancreas: Normal, with no mass or duct dilation. Spleen: Normal size. No mass. Adrenals/Urinary Tract: Stable 1.9 cm right adrenal and 2.7 cm left adrenal adenomas. Punctate nonobstructing stone versus vascular calcification in the posterior interpolar right renal sinus. No additional potential renal stones. No hydronephrosis. No contour deforming renal masses. Limited bladder visualization due to streak artifact, with no gross bladder abnormality. Stomach/Bowel: Grossly normal stomach. Normal caliber small bowel with no small bowel wall thickening. Normal appendix. There is a moderate to large amount of stool throughout the large bowel and rectum. There is mild wall thickening in the splenic flexure of the colon, descending colon, sigmoid colon and rectum with associated pericolonic fat stranding and ill-defined fluid. No evidence of pneumatosis. No significant colonic diverticulosis. Vascular/Lymphatic: Atherosclerotic nonaneurysmal abdominal aorta. No pathologically enlarged lymph nodes in the abdomen or pelvis. Reproductive: Grossly normal uterus.  No adnexal mass. Other: Trace left paracolic gutter ascites. No  focal fluid collection. No pneumoperitoneum. Mild anasarca. Musculoskeletal: No aggressive appearing focal osseous lesions. Partially visualized right total hip arthroplasty. Partially visualized surgical hardware in the bilateral wrists. Chronic mild L1 vertebral compression deformity is stable. Moderate thoracolumbar spondylosis. IMPRESSION: 1. Mild wall thickening in the splenic flexure, descending and sigmoid colon and rectum with associated pericolonic fat stranding, suggestive of a nonspecific infectious or inflammatory colitis, with the differential including C. diff colitis. No pneumatosis, free air or abscess. 2. Moderate to large colorectal stool volume. 3. Trace left paracolic gutter ascites. 4. Dense consolidation with associated volume loss in the dependent lung bases, favor atelectasis . 5. Mildly distended gallbladder with minimal sludge versus tiny gallstones. No gallbladder wall thickening. 6. Dilated common bile duct (10 mm diameter), mildly increased. No evidence of obstructing biliary mass or radiopaque choledocholithiasis. Recommend correlation with serum bilirubin levels. 7. Stable bilateral adrenal adenomas. 8.  Aortic Atherosclerosis (ICD10-I70.0). Electronically Signed   By: Ilona Sorrel M.D.   On: 06/16/2017 16:50   Dg Chest 2 View  Result Date: 06/15/2017 CLINICAL DATA:  Recently treated urinary tract infection, hypotension, possible mental status change. History of breast malignancy. EXAM: CHEST  2 VIEW COMPARISON:  Chest x-ray of April 27, 2017 FINDINGS: There is mild hypoinflation of the right lung. The interstitial markings of both lungs are coarse. There is density at the left lung base likely in the lower lobe which appears new. The cardiac silhouette is enlarged. The pulmonary vascularity is not clearly engorged. There is tortuosity of the descending thoracic aorta with calcification in the wall. There are chronic changes of the right shoulder. IMPRESSION: Findings worrisome for  atelectasis or pneumonia in the left lower lobe. Followup PA and lateral chest X-ray is recommended in 3-4 weeks following trial of antibiotic therapy to ensure resolution and exclude underlying malignancy. Stable cardiomegaly  without pulmonary vascular congestion or pulmonary edema. Thoracic aortic atherosclerosis. Electronically Signed   By: David  Martinique M.D.   On: 06/15/2017 13:17   US Abdomen Complete  Result Date: 06/15/2017 CLINICAL DATA:  Abdominal pain with nausea and vomiting EXAM: ABDOMEN ULTRASOUND COMPLETE COMPARISON:  Radiograph 04/29/2017, ultrasound 04/28/2017, CT 04/27/2017 FINDINGS: Gallbladder: Small echogenic foci in the gallbladder measuring up to 11 mm but without much shadowing, could represent small amount of sludge or small stones. Negative sonographic Murphy. Normal wall thickness. Common bile duct: Diameter: Slightly enlarged, measuring up to 1 cm, but similar compared with prior ultrasound from June. Liver: Increased echogenicity.  No focal abnormality is seen. IVC: Limited visualization.  No significant abnormality. Pancreas: Visualized portion unremarkable. Spleen: Size and appearance within normal limits. Right Kidney: Length: 12.9 cm. Echogenicity within normal limits. No mass or hydronephrosis visualized. Left Kidney: Length: 12 cm. Echogenicity within normal limits. No mass or hydronephrosis visualized. Abdominal aorta: Proximal aorta 2.4 cm. Mid to distal aorta obscured by bowel gas Other findings: None. IMPRESSION: 1. Small linear echogenic focus in the gallbladder could relate to a small amount of sludge or possible small stones. Normal wall thickness and negative sonographic Murphy. 2. Slightly enlarged common bowel duct up to 1 cm but without change since prior ultrasound from June 2018. Correlation with laboratory values previously suggested 3. Hepatic steatosis Electronically Signed   By: Donavan Foil M.D.   On: 06/15/2017 18:05   Dg Chest Port 1 View  Result Date:  06/19/2017 CLINICAL DATA:  Acute onset of respiratory failure. Vomiting. Initial encounter. EXAM: PORTABLE CHEST 1 VIEW COMPARISON:  Chest radiograph performed 06/18/2017 FINDINGS: The lungs are hypoexpanded. There is persistent right apical airspace opacity, and increasing left apical and left basilar airspace opacities, concerning for multifocal pneumonia. No pleural effusion or pneumothorax is seen. The cardiomediastinal silhouette is mildly enlarged. No acute osseous abnormalities are identified. A right IJ line is noted ending about the distal SVC. IMPRESSION: 1. Lungs hypoexpanded. Persistent right apical airspace opacity, and increasing left apical and left basilar airspace opacities, concerning for worsening multifocal pneumonia. 2. Mild cardiomegaly. Electronically Signed   By: Garald Balding M.D.   On: 06/19/2017 04:36   Dg Chest Port 1 View  Result Date: 06/18/2017 CLINICAL DATA:  Respiratory failure. EXAM: PORTABLE CHEST 1 VIEW COMPARISON:  06/17/2017. FINDINGS: Right IJ catheter again noted with tip in the right atrium. Heart size stable. Persistent right upper lobe infiltrate, progressed from prior exam. Findings consist with pneumonia. Mild left base subsegmental atelectasis. No pleural effusion pneumothorax. Surgical clips right chest. IMPRESSION: 1. Right IJ catheter again noted with tip in the right atrium . 2. Progressive right upper lobe infiltrate consistent with pneumonia. 3. Mild left base subsegmental atelectasis. Electronically Signed   By: Marcello Moores  Register   On: 06/18/2017 07:07   Dg Chest Port 1 View  Result Date: 06/17/2017 CLINICAL DATA:  Respiratory failure. EXAM: PORTABLE CHEST 1 VIEW COMPARISON:  06/15/2017. FINDINGS: Right IJ line noted with its tip projected over the right atrium. Heart size normal. Right upper lobe mild infiltrate. Progressive left lower lobe atelectasis. Tiny left pleural effusion cannot be excluded . No pneumothorax. Surgical clips right chest.  IMPRESSION: 1. Right IJ line noted with its tip over the right atrium. No pneumothorax. 2. New onset mild right upper lobe infiltrate suggesting pneumonia. Progressive lower lobe atelectasis. Electronically Signed   By: Marcello Moores  Register   On: 06/17/2017 06:56   Dg Chest Port 1 View  Result Date:  06/15/2017 CLINICAL DATA:  Sepsis. EXAM: PORTABLE ABDOMEN - 1 VIEW; PORTABLE CHEST - 1 VIEW COMPARISON:  Chest x-ray 06/15/2017 and abdominal films 04/27/2017 FINDINGS: The portable semi upright chest film demonstrates normal heart size. Stable tortuosity and calcification of the thoracic aorta. Right IJ center venous catheter tip is in the distal SVC. Low lung volumes with vascular crowding and streaky basilar atelectasis. No definite effusions or infiltrates Two views of the abdomen demonstrate air filled colon with scattered stool down to the rectum. No findings for small bowel obstruction or free air. The soft tissue shadows are grossly maintained. Stable vascular calcifications. IMPRESSION: No acute cardiopulmonary findings. Low lung volumes with vascular crowding and streaky bibasilar atelectasis. No plain film findings for an acute abdominal process. Probable mild colonic ileus and constipation. Electronically Signed   By: Marijo Sanes M.D.   On: 06/15/2017 18:27   Dg Abd Portable 1v  Result Date: 06/15/2017 CLINICAL DATA:  Sepsis. EXAM: PORTABLE ABDOMEN - 1 VIEW; PORTABLE CHEST - 1 VIEW COMPARISON:  Chest x-ray 06/15/2017 and abdominal films 04/27/2017 FINDINGS: The portable semi upright chest film demonstrates normal heart size. Stable tortuosity and calcification of the thoracic aorta. Right IJ center venous catheter tip is in the distal SVC. Low lung volumes with vascular crowding and streaky basilar atelectasis. No definite effusions or infiltrates Two views of the abdomen demonstrate air filled colon with scattered stool down to the rectum. No findings for small bowel obstruction or free air. The soft  tissue shadows are grossly maintained. Stable vascular calcifications. IMPRESSION: No acute cardiopulmonary findings. Low lung volumes with vascular crowding and streaky bibasilar atelectasis. No plain film findings for an acute abdominal process. Probable mild colonic ileus and constipation. Electronically Signed   By: Marijo Sanes M.D.   On: 06/15/2017 18:27     Scheduled Meds: . aspirin EC  81 mg Oral Daily  . citalopram  20 mg Oral Daily  . heparin  5,000 Units Subcutaneous Q8H  . insulin aspart  0-15 Units Subcutaneous Q4H  . ipratropium-albutero  3 mL Nebulization Q4H   Continuous Infusions: . sodium chloride    . meropenem (MERREM) IV Stopped (06/19/17 0919)  . potassium chloride 10 mEq (06/19/17 0949)  . vancomycin Stopped (06/19/17 0117)     LOS: 4 days    Time spent: 50 minutes  Greater than 50% of the time spent on counseling and coordinating the care, speaking with pt family.   Leisa Lenz, MD [facility] Pager [phone]  If 7PM-7AM, please contact night-coverage [email or web address] Password [facility] 06/19/2017, 9:57 AM

## 2017-06-19 NOTE — Progress Notes (Signed)
Agenda Progress Note Patient Name: Christy [name] DOB: 1941-09-12 MRN: [id number]   Date of Service  06/19/2017  HPI/Events of Note  Notified by RN that pt experienced emesis, followed by an increase in her WOB. She has been placed on 1.00 NRB mask, is receiving neb BD now. CXR is pending. Depending on how she progresses, may require further diuresis. May progress to BiPAp if we believe her MS is appropriate for this.   eICU Interventions       Intervention Category Intermediate Interventions: Respiratory distress - evaluation and management  BYRUM,ROBERT S. 06/19/2017, 4:02 AM

## 2017-06-19 NOTE — Progress Notes (Signed)
I speak the same language as pt and her family, please call me if translation needed Spoke with Pt daughter Early Chars Kaser [phone]

## 2017-06-19 NOTE — Progress Notes (Signed)
Attempted to place patient on [place] post tx, but patient became tachypneic with abdominal breathing during neb tx.Patient placed back on BiPAP. RN aware. RT will continue to monitor patient.

## 2017-06-19 NOTE — Progress Notes (Signed)
Attempted to inform pt family of pt change in condition.  Pt son-n-law answered and stated her daughter Olegario Shearer was sleep.  Brief update given.  Pt son-n-law stated they are about 30 min from hospital and if needed they would come.  They want everything done for pt.

## 2017-06-19 NOTE — Progress Notes (Signed)
PULMONARY / CRITICAL CARE MEDICINE   Name: Christy [name] MRN: [id number] DOB: 1940-11-07    ADMISSION DATE:  06/15/2017 CONSULTATION DATE:  8/14  REFERRING MD:  Alvino Chapel   CHIEF COMPLAINT:  Septic shock  HISTORY OF PRESENT ILLNESS:   76 [place] [facility] patient (resides there after knee surgery and deconditioning that followed since June 2018). Has h/u ESBL Klebsiella PNA UTI. Was in [place] until about 10d ago. Got weak, c/o RLQ discomfort. Treated for UTI which cultures again grew out ESBL K PNA. She was treated w/ bactrim at that time IV for 2 d then converted to PO. Initially felt better. Then was found 8/14 by nursing staff w/ decreased LOC, hypotensive w/ SBP in 60-70s. EMS called. Sepsis protocol initiated. LA > 7. Was given 30 ml/kg fluid challenge by ER team, remained hypotensive but MS improved some. Cultures were obtained, abx started. PCCM asked to admit.   SUBJECTIVE:   Overnight she was placed on BiPAP for worsening respiratory status, hypercapnia She had to be taken off Bipap today AM due to emesis.   VITAL SIGNS: BP (!) 141/72   Pulse 93   Temp 98.1 F (36.7 C) (Axillary)   Resp (!) 28   Ht 5\' 7"  (1.702 m)   Wt 91.8 kg (202 lb 6.1 oz)   SpO2 93%   BMI 31.70 kg/m   HEMODYNAMICS:    VENTILATOR SETTINGS: Vent Mode: BIPAP FiO2 (%):  [50 %-60 %] 50 % Set Rate:  [10 bmp] 10 bmp PEEP:  [5 cmH20] 5 cmH20  INTAKE / OUTPUT:  Intake/Output Summary (Last 24 hours) at 06/19/17 1129 Last data filed at 06/19/17 0600  Gross per 24 hour  Intake              540 ml  Output              500 ml  Net               40 ml     PHYSICAL EXAMINATION: Gen:      Chronically ill. Mild distress HEENT:  EOMI, sclera anicteric Neck:     No masses; no thyromegaly Lungs:    B/L crackles; Increased respiratory effort CV:         Regular rate and rhythm; no murmurs Abd:      + bowel sounds; soft, non-tender; no palpable masses, no distension Ext:    No edema; adequate peripheral  perfusion Skin:      Warm and dry; no rash Neuro: Awake, no focal deficits  LABS:  BMET  Recent Labs Lab 06/17/17 0457 06/18/17 0330 06/19/17 0801  NA 140 141 143  K 3.0* 3.3* 3.2*  CL 114* 115* 113*  CO2 19* 21* 24  BUN 36* 28* 24*  CREATININE 0.89 0.72 0.71  GLUCOSE 105* 107* 148*    Electrolytes  Recent Labs Lab 06/16/17 0358 06/17/17 0457 06/18/17 0330 06/19/17 0801  CALCIUM 8.3* 7.8* 7.9* 8.1*  MG 1.5* 1.7 1.7  --   PHOS 3.8 2.8  --   --     CBC  Recent Labs Lab 06/17/17 0457 06/18/17 0330 06/19/17 0801  WBC 5.5 5.2 6.2  HGB 8.9* 8.9* 9.1*  HCT 26.5* 26.7* 27.8*  PLT 173 194 248    Coag's  Recent Labs Lab 06/15/17 1647  APTT 33  INR 1.18    Sepsis Markers  Recent Labs Lab 06/15/17 1647  06/15/17 1847 06/15/17 2216 06/16/17 0358 06/16/17 0402 06/17/17 0457  LATICACIDVEN  --   < >  3.1* 2.8*  --  1.6  --   PROCALCITON 7.36  --   --   --  6.29  --  3.73  < > = values in this interval not displayed.  ABG  Recent Labs Lab 06/15/17 1650 06/19/17 0525  PHART 7.342* 7.266*  PCO2ART 29.7* 47.2  PO2ART 67.9* 101    Liver Enzymes  Recent Labs Lab 06/15/17 1222 06/17/17 0457  AST 29 22  ALT 17 17  ALKPHOS 102 210*  BILITOT 0.5 0.4  ALBUMIN 3.0* 2.1*    Cardiac Enzymes  Recent Labs Lab 06/18/17 1014 06/18/17 1530 06/18/17 2345  TROPONINI 0.05* 0.04* 0.03*    Glucose  Recent Labs Lab 06/18/17 1257 06/18/17 1627 06/18/17 1947 06/18/17 2345 06/19/17 0436 06/19/17 0753  GLUCAP 133* 102* 136* 153* 150* 135*    Imaging Dg Chest Port 1 View  Result Date: 06/19/2017 CLINICAL DATA:  Acute onset of respiratory failure. Vomiting. Initial encounter. EXAM: PORTABLE CHEST 1 VIEW COMPARISON:  Chest radiograph performed 06/18/2017 FINDINGS: The lungs are hypoexpanded. There is persistent right apical airspace opacity, and increasing left apical and left basilar airspace opacities, concerning for multifocal pneumonia. No  pleural effusion or pneumothorax is seen. The cardiomediastinal silhouette is mildly enlarged. No acute osseous abnormalities are identified. A right IJ line is noted ending about the distal SVC. IMPRESSION: 1. Lungs hypoexpanded. Persistent right apical airspace opacity, and increasing left apical and left basilar airspace opacities, concerning for worsening multifocal pneumonia. 2. Mild cardiomegaly. Electronically Signed   By: Garald Balding M.D.   On: 06/19/2017 04:36     STUDIES:  AbdUS 8/14 >> small echogenic focus in GB.  Slightly enlarged CBD up to 1cm.  No sonographic Murphy's.  Hepatic steatosis. CT A / P 8/15 >>mild wall thickening in the splenic flexure, descending & sigmoid colon & rectum with associated fat stranding (suggestive of nonspecific infectious / inflammatory colitis) no free air or abscess.  Moderate to large colorectal stool volume, dense consolidation with volume loss in the dependent bases (favor atelectasis).  Mildly distended gallbladder with minimal sludge vs tiny gallstones, dilated common bile duct without evidence of obstructing biliary mass  CULTURES: BCX 2 8/14 >>neg  UC 8/14 >> K.PNA (MDR but not ESBL producer), E.Faecalis   ANTIBIOTICS: Merrem 8/14 >> vanc 8/14 >>  SIGNIFICANT EVENTS: 8/14  Admit 8/15  Remains on 43mcg/min levophed.  CVP 7 this morning. C/o moderate abdominal pain with guarding. 8/16  Off pressors, VSS 8/17 PCCM to see for increased WOB  LINES/TUBES:   DISCUSSION: Septic shock -resolved. Growing both E Faecalis and K/.PNA in urine. More SOB this am. CXR w/ progression of RUL airspace disease. We have also been covering for HCAP.   Had to be taken off BiPAP due to emesis. Recheck ABG.  Worse she'll need intubation.  ASSESSMENT / PLAN:  Acute respiratory distress.  Right upper lobe HCAP Plan Repeat lasix 40 gm IV once Continue vanco, mero Bipap as needed. Would be a problem with emesis. Hope to avoid intubation. Will discuss  with family regarding goals of care.   Sepsis in setting of MDR Klebsiella PNA and E Faecalis UTI Possible HCAP Plan Cont Merrem (started 8/14) Resume Vanc (stopped 8/16 and resume 8/17)  LBBB, mild trop I elevation.  LVEF 55-60% possible anterior septal and apical inferior hypokinesis (new since May).  Plan Seen by cards. No need for ischemia eval.   AKI - improving/ resolved Hypokalemia & hyperchloremia  Lactic acidosis - resolved. P:  Replace K F/u am chem Repeat lasix  Abd pain - see CT results above.  Intraabdominal process vs referred from UTI? Amylase/lipase negative.  H/o constipation  P:   Keep NPO for now  Anemia of chronic disease VTE prophylaxis. P:  [place] heparin Transfuse per protocol   DM w/ hyperglycemia & hypoglycemia  P:   SSI coverage   Acute encephalopathy c/b primary language barrier  Recent left knee surgery (6 weeks ago) P:   Holding sedating meds Supportive care  FAMILY  - Updates:  No family at bedside am 8/16.  Unable to find patient language on translation program per RN.   The patient is critically ill with multiple organ system failure and requires high complexity decision making for assessment and support, frequent evaluation and titration of therapies, advanced monitoring, review of radiographic studies and interpretation of complex data.   Critical Care Time devoted to patient care services, exclusive of separately billable procedures, described in this note is 35 minutes.   Marshell Garfinkel MD [facility] Pager [phone] If no answer or after 3pm call: [phone] 06/19/2017, 11:30 AM

## 2017-06-19 NOTE — Progress Notes (Signed)
[facility] Progress Note Patient Name: Christy [name] DOB: Sep 21, 1941 MRN: [id number]   Date of Service  06/19/2017  HPI/Events of Note  CXR with little change compared with 8/16   eICU Interventions       Intervention Category Intermediate Interventions: Respiratory distress - evaluation and management  Erdem Naas S. 06/19/2017, 4:32 AM

## 2017-06-19 NOTE — Progress Notes (Signed)
PT Cancellation Note  Patient Details Name: Taziyah Iannuzzi MRN: [id number] DOB: [birth year]   Cancelled Treatment:    Reason Eval/Treat Not Completed: Medical issues which prohibited therapy--pt currently on BIPAP.Will hold PT for now.   Weston Anna, MPT Pager: [phone]

## 2017-06-20 ENCOUNTER — Inpatient Hospital Stay (HOSPITAL_COMMUNITY): Payer: Medicare Other

## 2017-06-20 LAB — BLOOD GAS, ARTERIAL
ACID-BASE DEFICIT: 1 mmol/L (ref 0.0–2.0)
BICARBONATE: 22.7 mmol/L (ref 20.0–28.0)
Drawn by: 441261
FIO2: 100
LHR: 28 {breaths}/min
O2 Saturation: 99.4 %
PEEP/CPAP: 5 cmH2O
Patient temperature: 98.6
VT: 370 mL
pCO2 arterial: 35.9 mmHg (ref 32.0–48.0)
pH, Arterial: 7.417 (ref 7.350–7.450)
pO2, Arterial: 220 mmHg — ABNORMAL HIGH (ref 83.0–108.0)

## 2017-06-20 LAB — BASIC METABOLIC PANEL
ANION GAP: 8 (ref 5–15)
ANION GAP: 9 (ref 5–15)
BUN: 20 mg/dL (ref 6–20)
BUN: 22 mg/dL — AB (ref 6–20)
CHLORIDE: 114 mmol/L — AB (ref 101–111)
CO2: 24 mmol/L (ref 22–32)
CO2: 25 mmol/L (ref 22–32)
Calcium: 7.4 mg/dL — ABNORMAL LOW (ref 8.9–10.3)
Calcium: 7.8 mg/dL — ABNORMAL LOW (ref 8.9–10.3)
Chloride: 115 mmol/L — ABNORMAL HIGH (ref 101–111)
Creatinine, Ser: 0.63 mg/dL (ref 0.44–1.00)
Creatinine, Ser: 0.72 mg/dL (ref 0.44–1.00)
GFR calc Af Amer: >60 mL/min (ref >60)
GFR calc Af Amer: >60 mL/min (ref >60)
GFR calc non Af Amer: >60 mL/min (ref >60)
GLUCOSE: 112 mg/dL — AB (ref 65–99)
Glucose, Bld: 139 mg/dL — ABNORMAL HIGH (ref 65–99)
POTASSIUM: 2.7 mmol/L — AB (ref 3.5–5.1)
POTASSIUM: 2.8 mmol/L — AB (ref 3.5–5.1)
Sodium: 147 mmol/L — ABNORMAL HIGH (ref 135–145)
Sodium: 148 mmol/L — ABNORMAL HIGH (ref 135–145)

## 2017-06-20 LAB — CULTURE, BLOOD (ROUTINE X 2)
CULTURE: NO GROWTH
Culture: NO GROWTH
SPECIAL REQUESTS: ADEQUATE
Special Requests: ADEQUATE

## 2017-06-20 LAB — GLUCOSE, CAPILLARY
GLUCOSE-CAPILLARY: 146 mg/dL — AB (ref 65–99)
GLUCOSE-CAPILLARY: 149 mg/dL — AB (ref 65–99)
Glucose-Capillary: 116 mg/dL — ABNORMAL HIGH (ref 65–99)
Glucose-Capillary: 118 mg/dL — ABNORMAL HIGH (ref 65–99)
Glucose-Capillary: 128 mg/dL — ABNORMAL HIGH (ref 65–99)
Glucose-Capillary: 149 mg/dL — ABNORMAL HIGH (ref 65–99)

## 2017-06-20 LAB — CBC
HCT: 27.3 % — ABNORMAL LOW (ref 36.0–46.0)
HEMOGLOBIN: 9 g/dL — AB (ref 12.0–15.0)
MCH: 29.7 pg (ref 26.0–34.0)
MCHC: 33 g/dL (ref 30.0–36.0)
MCV: 90.1 fL (ref 78.0–100.0)
PLATELETS: 243 10*3/uL (ref 150–400)
RBC: 3.03 MIL/uL — AB (ref 3.87–5.11)
RDW: 15.7 % — ABNORMAL HIGH (ref 11.5–15.5)
WBC: 8.8 10*3/uL (ref 4.0–10.5)

## 2017-06-20 MED ORDER — POTASSIUM CHLORIDE 10 MEQ/100ML IV SOLN: 10.0000 meq | INTRAVENOUS | Status: DC

## 2017-06-20 MED ORDER — MIDAZOLAM HCL 2 MG/2ML IJ SOLN: 1.0000 mg | INTRAMUSCULAR | Status: DC | PRN

## 2017-06-20 MED ORDER — MIDAZOLAM HCL 2 MG/2ML IJ SOLN
1.0000 mg | INTRAMUSCULAR | Status: DC | PRN
  Administered 2017-06-20: 1 mg via INTRAVENOUS
  Filled 2017-06-20: qty 2

## 2017-06-20 MED ORDER — ROCURONIUM BROMIDE 50 MG/5ML IV SOLN
50.0000 mg | Freq: Once | INTRAVENOUS | Status: AC
Start: 2017-06-20 — End: 2017-06-20
  Administered 2017-06-20: 50 mg via INTRAVENOUS

## 2017-06-20 MED ORDER — FENTANYL CITRATE (PF) 100 MCG/2ML IJ SOLN
INTRAMUSCULAR | Status: AC
  Administered 2017-06-20: 100 ug
  Filled 2017-06-20: qty 4

## 2017-06-20 MED ORDER — MIDAZOLAM HCL 2 MG/2ML IJ SOLN
INTRAMUSCULAR | Status: AC
  Administered 2017-06-20: 2 mg
  Filled 2017-06-20: qty 4

## 2017-06-20 MED ORDER — POTASSIUM CHLORIDE 10 MEQ/50ML IV SOLN
10.0000 meq | INTRAVENOUS | Status: AC
  Administered 2017-06-20 (×3): 10 meq via INTRAVENOUS
  Filled 2017-06-20 (×3): qty 50

## 2017-06-20 MED ORDER — CHLORHEXIDINE GLUCONATE 0.12% ORAL RINSE (MEDLINE KIT)
15.0000 mL | Freq: Two times a day (BID) | OROMUCOSAL | Status: DC
  Administered 2017-06-20 – 2017-06-25 (×10): 15 mL via OROMUCOSAL

## 2017-06-20 MED ORDER — ORAL CARE MOUTH RINSE
15.0000 mL | Freq: Four times a day (QID) | OROMUCOSAL | Status: DC
  Administered 2017-06-20 – 2017-06-25 (×17): 15 mL via OROMUCOSAL

## 2017-06-20 MED ORDER — POTASSIUM CHLORIDE 10 MEQ/50ML IV SOLN
10.0000 meq | INTRAVENOUS | Status: AC
  Administered 2017-06-20 – 2017-06-21 (×6): 10 meq via INTRAVENOUS
  Filled 2017-06-20 (×6): qty 50

## 2017-06-20 MED ORDER — ETOMIDATE 2 MG/ML IV SOLN
20.0000 mg | Freq: Once | INTRAVENOUS | Status: AC
  Administered 2017-06-20: 20 mg via INTRAVENOUS

## 2017-06-20 MED ORDER — FENTANYL CITRATE (PF) 100 MCG/2ML IJ SOLN: 50.0000 ug | INTRAMUSCULAR | Status: DC | PRN

## 2017-06-20 NOTE — Progress Notes (Signed)
PULMONARY / CRITICAL CARE MEDICINE   Name: Christy [name] MRN: [id number] DOB: Apr 27, 1941    ADMISSION DATE:  06/15/2017 CONSULTATION DATE:  8/14  REFERRING MD:  Alvino Chapel   CHIEF COMPLAINT:  Septic shock  HISTORY OF PRESENT ILLNESS:   76 [place] patient (resides there after knee surgery and deconditioning that followed since June 2018). Has h/u ESBL Klebsiella PNA UTI. Was in [place] until about 10d ago. Got weak, c/o RLQ discomfort. Treated for UTI which cultures again grew out ESBL K PNA. She was treated w/ bactrim at that time IV for 2 d then converted to PO. Initially felt better. Then was found 8/14 by nursing staff w/ decreased LOC, hypotensive w/ SBP in 60-70s. EMS called. Sepsis protocol initiated. LA > 7. Was given 30 ml/kg fluid challenge by ER team, remained hypotensive but MS improved some. Cultures were obtained, abx started. PCCM asked to admit.   SUBJECTIVE:   Unable to tolerate Bipap due to emesis. NSVT today AM. K repleted. Increased WOB  VITAL SIGNS: BP 129/70   Pulse 86   Temp 98 F (36.7 C) (Axillary)   Resp (!) 35   Ht 5\' 7"  (1.702 m)   Wt 85.7 kg (188 lb 15 oz)   SpO2 92%   BMI 29.59 kg/m   HEMODYNAMICS:    VENTILATOR SETTINGS: FiO2 (%):  [50 %-60 %] 60 %  INTAKE / OUTPUT:  Intake/Output Summary (Last 24 hours) at 06/20/17 1112 Last data filed at 06/20/17 0600  Gross per 24 hour  Intake              800 ml  Output             1150 ml  Net             -350 ml     PHYSICAL EXAMINATION: Gen:      Chronically ill. Moderate distress HEENT:  EOMI, sclera anicteric Neck:     No masses; no thyromegaly Lungs:    B/L crackles, wheeze; worsened respiratory effort CV:         Regular rate and rhythm; no murmurs Abd:      + bowel sounds; soft, non-tender; no palpable masses, no distension Ext:    No edema; adequate peripheral perfusion Skin:      Warm and dry; no rash Neuro: alert and oriented x 3 Psych: normal mood and affect  LABS:  BMET  Recent  Labs Lab 06/18/17 0330 06/19/17 0801 06/20/17 0632  NA 141 143 147*  K 3.3* 3.2* 2.7*  CL 115* 113* 115*  CO2 21* 24 24  BUN 28* 24* 20  CREATININE 0.72 0.71 0.63  GLUCOSE 107* 148* 139*    Electrolytes  Recent Labs Lab 06/16/17 0358 06/17/17 0457 06/18/17 0330 06/19/17 0801 06/20/17 0632  CALCIUM 8.3* 7.8* 7.9* 8.1* 7.4*  MG 1.5* 1.7 1.7  --   --   PHOS 3.8 2.8  --   --   --     CBC  Recent Labs Lab 06/18/17 0330 06/19/17 0801 06/20/17 0632  WBC 5.2 6.2 8.8  HGB 8.9* 9.1* 9.0*  HCT 26.7* 27.8* 27.3*  PLT 194 248 243    Coag's  Recent Labs Lab 06/15/17 1647  APTT 33  INR 1.18    Sepsis Markers  Recent Labs Lab 06/15/17 1647  06/15/17 1847 06/15/17 2216 06/16/17 0358 06/16/17 0402 06/17/17 0457  LATICACIDVEN  --   < > 3.1* 2.8*  --  1.6  --  PROCALCITON 7.36  --   --   --  6.29  --  3.73  < > = values in this interval not displayed.  ABG  Recent Labs Lab 06/15/17 1650 06/19/17 0525  PHART 7.342* 7.266*  PCO2ART 29.7* 47.2  PO2ART 67.9* 101    Liver Enzymes  Recent Labs Lab 06/15/17 1222 06/17/17 0457 06/19/17 1116  AST 29 22 19   ALT 17 17 18   ALKPHOS 102 210* 181*  BILITOT 0.5 0.4 0.7  ALBUMIN 3.0* 2.1* 1.9*    Cardiac Enzymes  Recent Labs Lab 06/18/17 1014 06/18/17 1530 06/18/17 2345  TROPONINI 0.05* 0.04* 0.03*    Glucose  Recent Labs Lab 06/19/17 1216 06/19/17 1628 06/19/17 1932 06/19/17 2358 06/20/17 0421 06/20/17 0720  GLUCAP 152* 99 101* 127* 118* 146*    Imaging No results found.   STUDIES:  AbdUS 8/14 >> small echogenic focus in GB.  Slightly enlarged CBD up to 1cm.  No sonographic Murphy's.  Hepatic steatosis. CT A / P 8/15 >>mild wall thickening in the splenic flexure, descending & sigmoid colon & rectum with associated fat stranding (suggestive of nonspecific infectious / inflammatory colitis) no free air or abscess.  Moderate to large colorectal stool volume, dense consolidation with  volume loss in the dependent bases (favor atelectasis).  Mildly distended gallbladder with minimal sludge vs tiny gallstones, dilated common bile duct without evidence of obstructing biliary mass  CULTURES: BCX 2 8/14 >>neg  UC 8/14 >> K.PNA (MDR but not ESBL producer), E.Faecalis   ANTIBIOTICS: Merrem 8/14 >> vanc 8/14 >>  SIGNIFICANT EVENTS: 8/14  Admit 8/15  Remains on 13mcg/min levophed.  CVP 7 this morning. C/o moderate abdominal pain with guarding. 8/16  Off pressors, VSS 8/17 PCCM to see for increased WOB  LINES/TUBES:  DISCUSSION: Septic shock -resolved. Growing both E Faecalis and K/.PNA in urine. More SOB this am. CXR w/ progression of RUL airspace disease. We have also been covering for HCAP.   ASSESSMENT / PLAN:  Acute respiratory distress.  Right upper lobe HCAP Plan Continue vanco, mero Continue HFNC Likely headed toward intubation. Will discuss with family regarding goals of care.   Sepsis in setting of MDR Klebsiella PNA and E Faecalis UTI Possible HCAP Plan Cont Merrem (started 8/14) Resume Vanc (stopped 8/16 and resume 8/17)  LBBB, mild trop I elevation.  LVEF 55-60% possible anterior septal and apical inferior hypokinesis (new since May).  Plan Seen by cards. No need for ischemia eval.   AKI - improving/ resolved Hypokalemia & hyperchloremia  Lactic acidosis - resolved. P:   Replace K F/u am chem Continue lasix  Abd pain - see CT results above.  Intraabdominal process vs referred from UTI? Amylase/lipase negative.  H/o constipation  P:   Keep NPO for now  Anemia of chronic disease VTE prophylaxis. P:  [place] heparin Transfuse per protocol   DM w/ hyperglycemia & hypoglycemia  P:   SSI coverage  Acute encephalopathy c/b primary language barrier  Recent left knee surgery (6 weeks ago) P:   Holding sedating meds Supportive care  FAMILY  - Updates:  No family at bedside am 8/16.  Unable to find patient language on translation program  per RN.  Discussed with daughter on phone 8/19. She is full code.   The patient is critically ill with multiple organ system failure and requires high complexity decision making for assessment and support, frequent evaluation and titration of therapies, advanced monitoring, review of radiographic studies and interpretation of complex data.  Critical Care Time devoted to patient care services, exclusive of separately billable procedures, described in this note is 35 minutes.   Marshell Garfinkel MD [facility] Pager [phone] If no answer or after 3pm call: [phone] 06/20/2017, 11:12 AM

## 2017-06-20 NOTE — Progress Notes (Signed)
[facility] Progress Note Patient Name: Christy [name] DOB: 06-09-1941 MRN: [id number]   Date of Service  06/20/2017  HPI/Events of Note  Low k  eICU Interventions  replaced     Intervention Category Minor Interventions: Electrolytes abnormality - evaluation and management  Mauri Brooklyn, P 06/20/2017, 11:00 PM

## 2017-06-20 NOTE — Procedures (Signed)
Intubation Procedure Note Christy [name] [id number] 12-Oct-1941  Procedure: Intubation Indications: Airway protection and maintenance  Procedure Details Consent: Risks of procedure as well as the alternatives and risks of each were explained to the (patient/caregiver).  Consent for procedure obtained. Time Out: Verified patient identification, verified procedure, site/side was marked, verified correct patient position, special equipment/implants available, medications/allergies/relevent history reviewed, required imaging and test results available.  Performed  Maximum sterile technique was used including gloves and mask.  Miller and 3, DL Grade 1 view Size 7.5 secured 23 at lip.  Evaluation Hemodynamic Status: BP stable throughout; O2 sats: stable throughout Patient's Current Condition: stable Complications: No apparent complications Patient did tolerate procedure well. Chest X-ray ordered to verify placement.  CXR: pending.   [name] 06/20/2017

## 2017-06-20 NOTE — Progress Notes (Signed)
Physical Therapy Discharge Patient Details Name: Christy [name] MRN: [id number] DOB: [birth year] Today's Date: 06/20/2017 Time:  -     Patient discharged from PT services secondary to medical decline - will need to re-order PT to resume therapy services. Intubated today.    GP     Marcelino Freestone PT [phone]  06/20/2017, 1:16 PM

## 2017-06-20 NOTE — Progress Notes (Addendum)
Patient ID: Christy [name], female   DOB: [birth year], 76 y.o.   MRN: [id number]  PROGRESS NOTE    Christy [name]  XJO:[id number] DOB: 07-May-1941 DOA: 06/15/2017  PCP: Christy Skiff, MD   Brief Narrative:  76 year old female (speaks [name] language), from NH after knee surgery and deconditioning that followed in 04/2017, has history of ESBL Klebsiella pneumoniae UTI. She started to experience right lower quadrant abdominal pain. Her cx continued to grow ESBL and she was treated with IV bactrim which was then changed to PO. On 8/14, she was found by NH staff with decreased consciousness, hypotensive with SBP in 60-70's. Lactic was was 7. She remained hypotensive with fluid challenge so she required pressor support. Pt transitioned to [facility] care 8/17. Pt more rhonchorous on 8/17, transferred to SDU due to increased work of breathing. CCM consulted. CXR showed right upper lung lobe consolidation which is new since the admission.   Significant event: 8/15 - on Levophed 8/16 - off pressor support 8/17 - transferred back to SDU 8/18 - BiPAP  Assessment & Plan:   Active Problems:   Septic shock (HCC) / UTI Klebsiella - Septic shock on admission requiring pressor support from 8/14 - 8/16 - Urine cx grew more than 100K Klebsiella pneumoniae  - Continue meropenem and vanco     Acute respiratory failure with hypoxia / HCAP / Possible acute pulmonary edema  - CXR this am showed stable infiltrate in the right upper lobe and increased hazy opacity throughout the left lung  - Continue current abx - Appreciate PCCM following, pt may require intubation  - Continue lasix 40 mg IV Q 12 hours     Acute kidney injury / Hypokalemia - Due to septic shock - Cr WNL    Hypokalemia - Due to sepsis - Continue to supplement - Follow up BMP and magnesium level in am    Abdominal pain - Pt has had CT scan on admission which showed possible inflammatory or infectious colitis and also dilated common bile  duct mildly increased - Amylase, lipase negative - ALP 181 otherwise LFT's WNL    Mild troponin elevation - Likely in the setting of septic shock, levels consistent with demand ischemia - Cardio has seen pt in consultation, ischemic work up not required at this time     Acute metabolic encephalopathy - Oriented to place, person, not knowing the time    Protein-calorie malnutrition, severe - In the context of acute on chronic illness - NPO due to emesis     Stage 1 sacral ulcer and stage 2 lateral left heel - Seen by WOC   DVT prophylaxis:  SCD's Code Status: full code  Family Communication: spoke with daughter 8/18 Disposition Plan: remains in SDU   Consultants:   PCCM  Cardiology  Procedures:   BiPAP  Antimicrobials:   Merem 8/14 -->  Vanco 8/14 -->   Subjective: No overnight events.    Objective: Vitals:   06/20/17 1000 06/20/17 1100 06/20/17 1148 06/20/17 1154  BP: 129/70 (!) 123/37    Pulse: 86 89  85  Resp: (!) 35 (!) 23  (!) 22  Temp:   97.7 F (36.5 C)   TempSrc:   Axillary   SpO2: 92% 98%  100%  Weight:      Height:        Intake/Output Summary (Last 24 hours) at 06/20/17 1222 Last data filed at 06/20/17 0600  Gross per 24 hour  Intake  800 ml  Output              850 ml  Net              -50 ml   Filed Weights   06/18/17 1130 06/19/17 0502 06/20/17 0433  Weight: 93.5 kg (206 lb 2.1 oz) 91.8 kg (202 lb 6.1 oz) 85.7 kg (188 lb 15 oz)    Physical Exam  Constitutional: Appears well-developed and well-nourished. No distress.  CVS: Rate controlled, S1/S2 + Pulmonary: diminished, rhonchorous Abdominal: soft, non tender, (+) BS Musculoskeletal: Unna boots Neuro: No focal deficits  Skin: warm, dry  Psychiatric: Not agitated, not restless     Data Reviewed: I have personally reviewed following labs and imaging studies  CBC:  Recent Labs Lab 06/15/17 1222  06/16/17 0358 06/17/17 0457 06/18/17 0330 06/19/17 0801  06/20/17 0632  WBC 15.5*  < > 7.4 5.5 5.2 6.2 8.8  NEUTROABS 13.4*  --   --   --   --   --   --   HGB 11.4*  < > 10.0* 8.9* 8.9* 9.1* 9.0*  HCT 34.7*  < > 29.2* 26.5* 26.7* 27.8* 27.3*  MCV 87.6  < > 86.9 85.8 87.8 89.1 90.1  PLT 176  < > 179 173 194 248 243  < > = values in this interval not displayed. Basic Metabolic Panel:  Recent Labs Lab 06/16/17 0358 06/17/17 0457 06/18/17 0330 06/19/17 0801 06/20/17 0632  NA 137 140 141 143 147*  K 3.5 3.0* 3.3* 3.2* 2.7*  CL 108 114* 115* 113* 115*  CO2 19* 19* 21* 24 24  GLUCOSE 159* 105* 107* 148* 139*  BUN 48* 36* 28* 24* 20  CREATININE 1.62* 0.89 0.72 0.71 0.63  CALCIUM 8.3* 7.8* 7.9* 8.1* 7.4*  MG 1.5* 1.7 1.7  --   --   PHOS 3.8 2.8  --   --   --    GFR: Estimated Creatinine Clearance: 67.2 mL/min (by C-G formula based on SCr of 0.63 mg/dL). Liver Function Tests:  Recent Labs Lab 06/15/17 1222 06/17/17 0457 06/19/17 1116  AST 29 22 19   ALT 17 17 18   ALKPHOS 102 210* 181*  BILITOT 0.5 0.4 0.7  PROT 6.3* 5.0* 5.2*  ALBUMIN 3.0* 2.1* 1.9*    Recent Labs Lab 06/16/17 0952  LIPASE 18  AMYLASE 85   No results for input(s): AMMONIA in the last 168 hours. Coagulation Profile:  Recent Labs Lab 06/15/17 1647  INR 1.18   Cardiac Enzymes:  Recent Labs Lab 06/16/17 0358 06/16/17 0952 06/18/17 1014 06/18/17 1530 06/18/17 2345  TROPONINI 0.10* 0.10* 0.05* 0.04* 0.03*   BNP (last 3 results) No results for input(s): PROBNP in the last 8760 hours. HbA1C: No results for input(s): HGBA1C in the last 72 hours. CBG:  Recent Labs Lab 06/19/17 1932 06/19/17 2358 06/20/17 0421 06/20/17 0720 06/20/17 1117  GLUCAP 101* 127* 118* 146* 149*   Lipid Profile: No results for input(s): CHOL, HDL, LDLCALC, TRIG, CHOLHDL, LDLDIRECT in the last 72 hours. Thyroid Function Tests: No results for input(s): TSH, T4TOTAL, FREET4, T3FREE, THYROIDAB in the last 72 hours. Anemia Panel: No results for input(s): VITAMINB12,  FOLATE, FERRITIN, TIBC, IRON, RETICCTPCT in the last 72 hours. Urine analysis:    Component Value Date/Time   COLORURINE RED (A) 06/15/2017 1320   APPEARANCEUR CLOUDY (A) 06/15/2017 1320   LABSPEC 1.013 06/15/2017 1320   PHURINE 7.0 06/15/2017 1320   GLUCOSEU NEGATIVE 06/15/2017 1320   HGBUR SMALL (  A) 06/15/2017 1320   BILIRUBINUR NEGATIVE 06/15/2017 1320   KETONESUR NEGATIVE 06/15/2017 1320   PROTEINUR NEGATIVE 06/15/2017 1320   UROBILINOGEN 0.2 11/27/2013 2012   NITRITE NEGATIVE 06/15/2017 1320   LEUKOCYTESUR TRACE (A) 06/15/2017 1320   Sepsis Labs: @LABRCNTIP (procalcitonin:4,lacticidven:4)   ) Recent Results (from the past 240 hour(s))  Urine culture     Status: Abnormal   Collection Time: 06/15/17 12:18 PM  Result Value Ref Range Status   Specimen Description URINE, CATHETERIZED  Final   Special Requests NONE  Final   Culture (A)  Final    >=100,000 COLONIES/mL KLEBSIELLA PNEUMONIAE Confirmed Extended Spectrum Beta-Lactamase Producer (ESBL) >=100,000 COLONIES/mL ENTEROCOCCUS FAECALIS    Report Status 06/18/2017 FINAL  Final   Organism ID, Bacteria KLEBSIELLA PNEUMONIAE (A)  Final   Organism ID, Bacteria ENTEROCOCCUS FAECALIS (A)  Final      Susceptibility   Enterococcus faecalis - MIC*    AMPICILLIN <=2 SENSITIVE Sensitive     LEVOFLOXACIN >=8 RESISTANT Resistant     NITROFURANTOIN <=16 SENSITIVE Sensitive     VANCOMYCIN 1 SENSITIVE Sensitive     * >=100,000 COLONIES/mL ENTEROCOCCUS FAECALIS   Klebsiella pneumoniae - MIC*    AMPICILLIN >=32 RESISTANT Resistant     CEFAZOLIN >=64 RESISTANT Resistant     CEFTRIAXONE >=64 RESISTANT Resistant     CIPROFLOXACIN >=4 RESISTANT Resistant     GENTAMICIN >=16 RESISTANT Resistant     IMIPENEM <=0.25 SENSITIVE Sensitive     NITROFURANTOIN 64 INTERMEDIATE Intermediate     TRIMETH/SULFA >=320 RESISTANT Resistant     AMPICILLIN/SULBACTAM >=32 RESISTANT Resistant     PIP/TAZO >=128 RESISTANT Resistant     Extended ESBL  POSITIVE Resistant     * >=100,000 COLONIES/mL KLEBSIELLA PNEUMONIAE  Blood Culture (routine x 2)     Status: None (Preliminary result)   Collection Time: 06/15/17 12:22 PM  Result Value Ref Range Status   Specimen Description BLOOD LEFT ANTECUBITAL  Final   Special Requests   Final    BOTTLES DRAWN AEROBIC AND ANAEROBIC Blood Culture adequate volume   Culture   Final    NO GROWTH 4 DAYS Performed at [facility], [address], [place], [place] [address]    Report Status PENDING  Incomplete  Blood Culture (routine x 2)     Status: None (Preliminary result)   Collection Time: 06/15/17 12:46 PM  Result Value Ref Range Status   Specimen Description BLOOD RIGHT ANTECUBITAL  Final   Special Requests   Final    BOTTLES DRAWN AEROBIC AND ANAEROBIC Blood Culture adequate volume   Culture   Final    NO GROWTH 4 DAYS Performed at [facility], [place] [address] [place], [place] [address]    Report Status PENDING  Incomplete  MRSA PCR Screening     Status: Abnormal   Collection Time: 06/15/17  6:30 PM  Result Value Ref Range Status   MRSA by PCR POSITIVE (A) NEGATIVE Final    Comment:        The GeneXpert MRSA Assay (FDA approved for NASAL specimens only), is one component of a comprehensive MRSA colonization surveillance program. It is not intended to diagnose MRSA infection nor to guide or monitor treatment for MRSA infections. RESULT CALLED TO, READ BACK BY AND VERIFIED WITH: Audree Camel [id number] @ 2205 BY J SCOTTON       Radiology Studies: Ct Abdomen Pelvis Wo Contrast  Result Date: 06/16/2017 CLINICAL DATA:  Inpatient. Abdominal pain, tenderness and  guarding. Septic shock. EXAM: CT ABDOMEN AND PELVIS WITHOUT CONTRAST TECHNIQUE: Multidetector CT imaging of the abdomen and pelvis was performed following the standard protocol without IV contrast. COMPARISON:  06/15/2017 abdominal radiographs and abdominal sonogram. 04/27/2017 CT abdomen/pelvis. FINDINGS:  Examination is limited by motion artifact and by streak artifact from the upper extremities. Lower chest: Dense consolidation with associated volume loss in the dependent lower lobes and lingula, favor atelectasis. Three-vessel coronary atherosclerosis. Tip of superior approach central venous catheter is seen at the cavoatrial junction. Healed deformities are again noted and lower right ribs. Hepatobiliary: Normal liver size. No gross liver mass. Mildly distended gallbladder. No gallbladder wall thickening. Subcentimeter minimally hyperdense focus in the dependent gallbladder may represent sludge or tiny gallstones. Dilated common bile duct (10 mm diameter), mildly increased from 7 mm on 04/27/2017. No evidence of obstructing biliary mass or radiopaque choledocholithiasis. Pancreas: Normal, with no mass or duct dilation. Spleen: Normal size. No mass. Adrenals/Urinary Tract: Stable 1.9 cm right adrenal and 2.7 cm left adrenal adenomas. Punctate nonobstructing stone versus vascular calcification in the posterior interpolar right renal sinus. No additional potential renal stones. No hydronephrosis. No contour deforming renal masses. Limited bladder visualization due to streak artifact, with no gross bladder abnormality. Stomach/Bowel: Grossly normal stomach. Normal caliber small bowel with no small bowel wall thickening. Normal appendix. There is a moderate to large amount of stool throughout the large bowel and rectum. There is mild wall thickening in the splenic flexure of the colon, descending colon, sigmoid colon and rectum with associated pericolonic fat stranding and ill-defined fluid. No evidence of pneumatosis. No significant colonic diverticulosis. Vascular/Lymphatic: Atherosclerotic nonaneurysmal abdominal aorta. No pathologically enlarged lymph nodes in the abdomen or pelvis. Reproductive: Grossly normal uterus.  No adnexal mass. Other: Trace left paracolic gutter ascites. No focal fluid collection. No  pneumoperitoneum. Mild anasarca. Musculoskeletal: No aggressive appearing focal osseous lesions. Partially visualized right total hip arthroplasty. Partially visualized surgical hardware in the bilateral wrists. Chronic mild L1 vertebral compression deformity is stable. Moderate thoracolumbar spondylosis. IMPRESSION: 1. Mild wall thickening in the splenic flexure, descending and sigmoid colon and rectum with associated pericolonic fat stranding, suggestive of a nonspecific infectious or inflammatory colitis, with the differential including C. diff colitis. No pneumatosis, free air or abscess. 2. Moderate to large colorectal stool volume. 3. Trace left paracolic gutter ascites. 4. Dense consolidation with associated volume loss in the dependent lung bases, favor atelectasis . 5. Mildly distended gallbladder with minimal sludge versus tiny gallstones. No gallbladder wall thickening. 6. Dilated common bile duct (10 mm diameter), mildly increased. No evidence of obstructing biliary mass or radiopaque choledocholithiasis. Recommend correlation with serum bilirubin levels. 7. Stable bilateral adrenal adenomas. 8.  Aortic Atherosclerosis (ICD10-I70.0). Electronically Signed   By: Ilona Sorrel M.D.   On: 06/16/2017 16:50   Dg Chest 1 View  Result Date: 06/20/2017 CLINICAL DATA:  Respiratory distress EXAM: CHEST 1 VIEW COMPARISON:  June 19, 2017 FINDINGS: No pneumothorax. Persistent infiltrate in the right upper lobe. Increased hazy opacity on the left. No other interval changes. Stable right IJ. IMPRESSION: 1. Stable infiltrate in the right upper lobe. 2. Increased hazy opacity throughout the left lung. Recommend clinical correlation and attention on follow-up. Electronically Signed   By: Dorise Bullion III M.D   On: 06/20/2017 11:56   Dg Chest Port 1 View  Result Date: 06/19/2017 CLINICAL DATA:  Acute onset of respiratory failure. Vomiting. Initial encounter. EXAM: PORTABLE CHEST 1 VIEW COMPARISON:  Chest  radiograph performed 06/18/2017 FINDINGS:  The lungs are hypoexpanded. There is persistent right apical airspace opacity, and increasing left apical and left basilar airspace opacities, concerning for multifocal pneumonia. No pleural effusion or pneumothorax is seen. The cardiomediastinal silhouette is mildly enlarged. No acute osseous abnormalities are identified. A right IJ line is noted ending about the distal SVC. IMPRESSION: 1. Lungs hypoexpanded. Persistent right apical airspace opacity, and increasing left apical and left basilar airspace opacities, concerning for worsening multifocal pneumonia. 2. Mild cardiomegaly. Electronically Signed   By: Garald Balding M.D.   On: 06/19/2017 04:36   Dg Chest Port 1 View  Result Date: 06/18/2017 CLINICAL DATA:  Respiratory failure. EXAM: PORTABLE CHEST 1 VIEW COMPARISON:  06/17/2017. FINDINGS: Right IJ catheter again noted with tip in the right atrium. Heart size stable. Persistent right upper lobe infiltrate, progressed from prior exam. Findings consist with pneumonia. Mild left base subsegmental atelectasis. No pleural effusion pneumothorax. Surgical clips right chest. IMPRESSION: 1. Right IJ catheter again noted with tip in the right atrium . 2. Progressive right upper lobe infiltrate consistent with pneumonia. 3. Mild left base subsegmental atelectasis. Electronically Signed   By: Marcello Moores  Register   On: 06/18/2017 07:07   Dg Chest Port 1 View  Result Date: 06/17/2017 CLINICAL DATA:  Respiratory failure. EXAM: PORTABLE CHEST 1 VIEW COMPARISON:  06/15/2017. FINDINGS: Right IJ line noted with its tip projected over the right atrium. Heart size normal. Right upper lobe mild infiltrate. Progressive left lower lobe atelectasis. Tiny left pleural effusion cannot be excluded . No pneumothorax. Surgical clips right chest. IMPRESSION: 1. Right IJ line noted with its tip over the right atrium. No pneumothorax. 2. New onset mild right upper lobe infiltrate suggesting  pneumonia. Progressive lower lobe atelectasis. Electronically Signed   By: [place]   On: 06/17/2017 06:56     Scheduled Meds: . aspirin EC  81 mg Oral Daily  . citalopram  20 mg Oral Daily  . heparin  5,000 Units Subcutaneous Q8H  . insulin aspart  0-15 Units Subcutaneous Q4H  . ipratropium-albutero  3 mL Nebulization Q4H   Continuous Infusions: . sodium chloride    . meropenem (MERREM) IV Stopped (06/20/17 0753)  . vancomycin 1,000 mg (06/20/17 1126)     LOS: 5 days    Time spent: 25 minutes  Greater than 50% of the time spent on counseling and coordinating the care, speaking with pt family.   Leisa Lenz, MD [facility] Pager [phone]  If 7PM-7AM, please contact night-coverage [email or web address] Password [facility] 06/20/2017, 12:22 PM

## 2017-06-20 NOTE — Progress Notes (Addendum)
CRITICAL VALUE ALERT  Critical Value:  K+ 2.7  Date & Time Notied:  06/20/2017 0745  Provider Notified: Neil Crouch MD  Orders Received/Actions taken:   Repeat page to MD for orders [phone]

## 2017-06-21 ENCOUNTER — Ambulatory Visit (INDEPENDENT_AMBULATORY_CARE_PROVIDER_SITE_OTHER): Payer: Medicare Other | Admitting: Orthopaedic Surgery

## 2017-06-21 LAB — GLUCOSE, CAPILLARY
GLUCOSE-CAPILLARY: 149 mg/dL — AB (ref 65–99)
GLUCOSE-CAPILLARY: 128 mg/dL — AB (ref 65–99)
GLUCOSE-CAPILLARY: 178 mg/dL — AB (ref 65–99)
GLUCOSE-CAPILLARY: 169 mg/dL — AB (ref 65–99)
GLUCOSE-CAPILLARY: 99 mg/dL (ref 65–99)
Glucose-Capillary: 149 mg/dL — ABNORMAL HIGH (ref 65–99)

## 2017-06-21 LAB — CBC
HCT: 25.5 % — ABNORMAL LOW (ref 36.0–46.0)
HEMOGLOBIN: 8.3 g/dL — AB (ref 12.0–15.0)
MCH: 29 pg (ref 26.0–34.0)
MCHC: 32.5 g/dL (ref 30.0–36.0)
MCV: 89.2 fL (ref 78.0–100.0)
Platelets: 260 10*3/uL (ref 150–400)
RBC: 2.86 MIL/uL — AB (ref 3.87–5.11)
RDW: 15.8 % — ABNORMAL HIGH (ref 11.5–15.5)
WBC: 9.2 10*3/uL (ref 4.0–10.5)

## 2017-06-21 LAB — BASIC METABOLIC PANEL
Anion gap: 7 (ref 5–15)
BUN: 21 mg/dL — AB (ref 6–20)
CHLORIDE: 114 mmol/L — AB (ref 101–111)
CO2: 25 mmol/L (ref 22–32)
Calcium: 7.7 mg/dL — ABNORMAL LOW (ref 8.9–10.3)
Creatinine, Ser: 0.73 mg/dL (ref 0.44–1.00)
GFR calc Af Amer: >60 mL/min (ref >60)
GFR calc non Af Amer: >60 mL/min (ref >60)
Glucose, Bld: 158 mg/dL — ABNORMAL HIGH (ref 65–99)
POTASSIUM: 3.5 mmol/L (ref 3.5–5.1)
SODIUM: 146 mmol/L — AB (ref 135–145)

## 2017-06-21 LAB — VANCOMYCIN, TROUGH: VANCOMYCIN TR: 38 ug/mL — AB (ref 15–20)

## 2017-06-21 LAB — MAGNESIUM: Magnesium: 1.4 mg/dL — ABNORMAL LOW (ref 1.7–2.4)

## 2017-06-21 MED ORDER — POTASSIUM CHLORIDE 10 MEQ/50ML IV SOLN
10.0000 meq | INTRAVENOUS | Status: AC
  Administered 2017-06-21 (×4): 10 meq via INTRAVENOUS
  Filled 2017-06-21 (×4): qty 50

## 2017-06-21 MED ORDER — FUROSEMIDE 10 MG/ML IJ SOLN
40.0000 mg | Freq: Every day | INTRAMUSCULAR | Status: DC
  Administered 2017-06-21: 40 mg via INTRAVENOUS
  Filled 2017-06-21: qty 4

## 2017-06-21 MED ORDER — MAGNESIUM SULFATE 2 GM/50ML IV SOLN
2.0000 g | Freq: Once | INTRAVENOUS | Status: AC
  Administered 2017-06-21: 2 g via INTRAVENOUS
  Filled 2017-06-21: qty 50

## 2017-06-21 MED ORDER — CHLORHEXIDINE GLUCONATE 0.12 % MT SOLN
OROMUCOSAL | Status: AC
  Administered 2017-06-21: 15 mL
  Filled 2017-06-21: qty 15

## 2017-06-21 NOTE — Progress Notes (Signed)
Pharmacy Antibiotic Note  Christy [name] is a 76 y.o. female admitted on 06/15/2017 with AMS and UTI. She was recently treated for ESBL UTI infection & on Bactrim per nursing home [facility]. Admitted with Klebsiella UTI and septic shock, failure to respond to outpatient Bactrim She improved, came off pressors and was transferred to the floor 8/16. Developed acute respiratory distress 8/17 AM . Chest x-ray shows right upper lobe consolidation which is new since admission.  Vancomycin added back 8/17 to cover enterococcus.  Day #7 merrem for ESBL Klebsiella.  WBC WNL, creat WNL, Wt 87.5 kg.   Plan:  VT on 8/20 at 1215 is 38 mcg/ml. Will hold vancomycin  Obtain random vancomycin level with AM to help guide therapy   Continue meropenem 1 gm IV q8h  Monitor renal function, cultures, WBC, temp   Height: 5\' 7"  (170.2 cm) Weight: 192 lb 14.4 oz (87.5 kg) IBW/kg (Calculated) : 61.6  Temp (24hrs), Avg:98.2 F (36.8 C), Min:97.4 F (36.3 C), Max:98.6 F (37 C)   Recent Labs Lab 06/15/17 1233  06/15/17 1708 06/15/17 1847 06/15/17 2216  06/16/17 0402 06/17/17 0457 06/18/17 0330 06/19/17 0801 06/20/17 0632 06/20/17 2201 06/21/17 0424 06/21/17 1215  WBC  --   < >  --   --   --   < >  --  5.5 5.2 6.2 8.8  --  9.2  --   CREATININE  --   < >  --  2.30*  --   < >  --  0.89 0.72 0.71 0.63 0.72 0.73  --   LATICACIDVEN 7.37*  --  3.50* 3.1* 2.8*  --  1.6  --   --   --   --   --   --   --   VANCOTROUGH  --   --   --   --   --   --   --   --   --   --   --   --   --  38*  < > = values in this interval not displayed.  Estimated Creatinine Clearance: 68 mL/min (by C-G formula based on SCr of 0.73 mg/dL).    No Known Allergies Antimicrobials this admission: 8/14 Invanz x1 8/14 Vanc >> 8/16  8/17>> 8/14 Meropenem >>    Dose adjustments this admission: 8/15 increase meropenem from 500mg  to 1g q12h, vanc 1g to 1250mg  q24h for improved SCr 8/16 increase from 1g q12h to 1g q8h for improved  SCr 8/20 at 1215: VT is 38 mcg/ml. Hold vanc  Microbiology results: 8/14 BCx: NGF 8/14 UCx: >100k Klebsiella pneumoniae (S imip, I nitro, R all others)                  >100k Enterococcus faecalis (S amp, nitro, vanc; R levo) 8/14 MRSA PCR: positive   6/26 UCx: ESBL Klebsiella -only sens imipenem 6/3 UCx: Enterococcus faecalis: S=Amp, Nirtrofurantoin, Vanc, I=Levofloxacin  Thank you for allowing pharmacy to be a part of this patient's care.  Royetta Asal, PharmD, BCPS Pager [phone] 06/21/2017 1:11 PM

## 2017-06-21 NOTE — Plan of Care (Signed)
Problem: Fluid Volume: Goal: Ability to maintain a balanced intake and output will improve Outcome: Not Progressing Pt still + 11 liters

## 2017-06-21 NOTE — Progress Notes (Signed)
PULMONARY / CRITICAL CARE MEDICINE   Name: Christy [name] MRN: [id number] DOB: [birth year]    ADMISSION DATE:  06/15/2017 CONSULTATION DATE:  8/14  REFERRING MD:  Alvino Chapel   CHIEF COMPLAINT:  Septic shock  BRIEF SUMMARY:   76 y/o Female, SNF patient (resides there after knee surgery and deconditioning that followed since June 2018). Hx ESBL Klebsiella PNA UTI.  She developed weakness, RLQ discomfort and was treated with bactrim for UTI.  Found 8/14 by staff with decreased LOC, hypotensive with SBP in the 60-70's.  Lactic acid >7 on admit.  Treated with IVF resuscitation.  Cultures obtained and empiric abx started.  She required vasopressor support.  Urine culture was positive for Klebsiella pneumoniae & enterococcus faecalis.  She was weaned off vasopressors 8/16.  On 8/17 she developed respiratory distress and was transferred back to SDU for BiPAP.  She ultimately failed bipap due to emesis and was intubated 8/19.  She self extubated 8/20 am.      SUBJECTIVE:  RN reports pt self extubated at [place].  Interpreter utilized > pt denies pain, SOB. Asking for water. Afebrile, VSS.  VITAL SIGNS: BP (!) 97/44   Pulse 86   Temp 98.6 F (37 C)   Resp (!) 27   Ht 5\' 7"  (1.702 m)   Wt 192 lb 14.4 oz (87.5 kg)   SpO2 99%   BMI 30.21 kg/m   HEMODYNAMICS:    VENTILATOR SETTINGS: Vent Mode: PRVC FiO2 (%):  [40 %-100 %] 40 % Set Rate:  [25 bmp-28 bmp] 25 bmp Vt Set:  [370 mL] 370 mL PEEP:  [5 cmH20] 5 cmH20 Plateau Pressure:  [16 cmH20-19 cmH20] 16 cmH20  INTAKE / OUTPUT:  Intake/Output Summary (Last 24 hours) at 06/21/17 0827 Last data filed at 06/21/17 4098  Gross per 24 hour  Intake              400 ml  Output             2500 ml  Net            -2100 ml     PHYSICAL EXAMINATION: General: well developed elderly female in NAD, sitting up in bed HEENT: MM pink/moist, no jvd Neuro: Awake, alert to self, place. Not oriented to year.  Speech clear, MAE.  CV: s1s2 rrr, no  m/r/g PULM: even/non-labored, lungs bilaterally coarse  JX:BJYN, non-tender, bsx4 active  Extremities: warm/dry, trace generalized edema  Skin: no rashes or lesions  LABS:  BMET  Recent Labs Lab 06/20/17 0632 06/20/17 2201 06/21/17 0424  NA 147* 148* 146*  K 2.7* 2.8* 3.5  CL 115* 114* 114*  CO2 24 25 25   BUN 20 22* 21*  CREATININE 0.63 0.72 0.73  GLUCOSE 139* 112* 158*    Electrolytes  Recent Labs Lab 06/16/17 0358 06/17/17 0457 06/18/17 0330  06/20/17 0632 06/20/17 2201 06/21/17 0424  CALCIUM 8.3* 7.8* 7.9*  < > 7.4* 7.8* 7.7*  MG 1.5* 1.7 1.7  --   --   --  1.4*  PHOS 3.8 2.8  --   --   --   --   --   < > = values in this interval not displayed.  CBC  Recent Labs Lab 06/19/17 0801 06/20/17 0632 06/21/17 0424  WBC 6.2 8.8 9.2  HGB 9.1* 9.0* 8.3*  HCT 27.8* 27.3* 25.5*  PLT 248 243 260    Coag's  Recent Labs Lab 06/15/17 1647  APTT 33  INR 1.18  Sepsis Markers  Recent Labs Lab 06/15/17 1647  06/15/17 1847 06/15/17 2216 06/16/17 0358 06/16/17 0402 06/17/17 0457  LATICACIDVEN  --   < > 3.1* 2.8*  --  1.6  --   PROCALCITON 7.36  --   --   --  6.29  --  3.73  < > = values in this interval not displayed.  ABG  Recent Labs Lab 06/15/17 1650 06/19/17 0525 06/20/17 1429  PHART 7.342* 7.266* 7.417  PCO2ART 29.7* 47.2 35.9  PO2ART 67.9* 101 220*    Liver Enzymes  Recent Labs Lab 06/15/17 1222 06/17/17 0457 06/19/17 1116  AST 29 22 19   ALT 17 17 18   ALKPHOS 102 210* 181*  BILITOT 0.5 0.4 0.7  ALBUMIN 3.0* 2.1* 1.9*    Cardiac Enzymes  Recent Labs Lab 06/18/17 1014 06/18/17 1530 06/18/17 2345  TROPONINI 0.05* 0.04* 0.03*    Glucose  Recent Labs Lab 06/20/17 0720 06/20/17 1117 06/20/17 1610 06/20/17 2027 06/20/17 2351 06/21/17 0308  GLUCAP 146* 149* 149* 116* 128* 149*    Imaging Dg Chest 1 View  Result Date: 06/20/2017 CLINICAL DATA:  Respiratory distress EXAM: CHEST 1 VIEW COMPARISON:  June 19, 2017 FINDINGS: No pneumothorax. Persistent infiltrate in the right upper lobe. Increased hazy opacity on the left. No other interval changes. Stable right IJ. IMPRESSION: 1. Stable infiltrate in the right upper lobe. 2. Increased hazy opacity throughout the left lung. Recommend clinical correlation and attention on follow-up. Electronically Signed   By: Dorise Bullion III M.D   On: 06/20/2017 11:56   Dg Abd 1 View  Result Date: 06/20/2017 CLINICAL DATA:  76 year old female with a history of nasogastric tube placement EXAM: ABDOMEN - 1 VIEW COMPARISON:  CT 06/16/2017 FINDINGS: Limited plain film of the abdomen. Gastric tube courses over the low mediastinum in the upper abdomen, with the tip of the gastric tube terminating in the peripyloric region of the stomach. Gas within transverse colon, partially distended, similar to the comparison. Formed stool within the left colon. IMPRESSION: Limited plain film demonstrates gastric tube terminating in the peripyloric region of the stomach. Unchanged appearance of gaseous distention of transverse colon and formed stool in the left colon, similar prior CT. Electronically Signed   By: Corrie Mckusick D.O.   On: 06/20/2017 14:00   Dg Chest Port 1 View  Result Date: 06/20/2017 CLINICAL DATA:  Encounter for intubation. EXAM: PORTABLE CHEST 1 VIEW COMPARISON:  06/20/2017 FINDINGS: Endotracheal tube tip is at the orifice of the right mainstem bronchus. 2-3 cm of retraction suggested. An orogastric tube reaches the stomach at least. Right IJ central line with tip at the upper cavoatrial junction. Better lung volumes. Asymmetric airspace opacity consistent with pneumonia in this patient with history of septic shock. No effusion or pneumothorax. Stable heart size. These results were called by telephone at the time of interpretation on 06/20/2017 at 1:59 pm to RN Sarah who verbally acknowledged these results. IMPRESSION: 1. Endotracheal tube tip at the right mainstem bronchus,  suggest 2-3 cm of tube retraction. 2. New orogastric tube reaches the stomach. 3. Bilateral pneumonia.  Improved lung volumes. Electronically Signed   By: Monte Fantasia M.D.   On: 06/20/2017 14:00     STUDIES:  AbdUS 8/14 >> small echogenic focus in GB.  Slightly enlarged CBD up to 1cm.  No sonographic Murphy's.  Hepatic steatosis. CT A / P 8/15 >>mild wall thickening in the splenic flexure, descending & sigmoid colon & rectum with associated fat stranding (suggestive  of nonspecific infectious / inflammatory colitis) no free air or abscess.  Moderate to large colorectal stool volume, dense consolidation with volume loss in the dependent bases (favor atelectasis).  Mildly distended gallbladder with minimal sludge vs tiny gallstones, dilated common bile duct without evidence of obstructing biliary mass  CULTURES: BCX 2 8/14 >>neg  UC 8/14 >> K.PNA (MDR but not ESBL producer, S- imipenem), E.Faecalis (S-ampicillin, Vanco)  ANTIBIOTICS: Merrem 8/14 >> Vanc 8/14 >>  SIGNIFICANT EVENTS: 8/14  Admit 8/15  Remains on 41mcg/min levophed.  CVP 7 this morning. C/o moderate abdominal pain with guarding. 8/16  Off pressors, VSS 8/17  PCCM to see for increased WOB 8/19  Intubated for respiratory distress 8/20  Self-extubated  LINES/TUBES: ETT 8/19 >> 8/20   DISCUSSION: 76 y/o F admitted with septic shock in setting of E Faecalis and K/.PNA UTI.  Developed SOB 8/18-8/19, intubated > self extubated 8/20.  CXR w/ progression of RUL airspace disease.     ASSESSMENT / PLAN:  Acute Hypoxic Respiratory Failure  Right upper lobe HCAP Never Smoker P: O2 as needed to support sats > 92% Pulmonary hygiene - IS, flutter (pt does when you assist) PRN BiPAP if no further emesis ABX as above   Sepsis in setting of MDR Klebsiella PNA and E Faecalis UTI RUL HCAP P: Continue Meropenem, Vancomycin  Monitor fever curve / WBC trend    LBBB, mild trop I elevation.  LVEF 55-60% possible anterior septal  and apical inferior hypokinesis (new since May).  P: Cardiology evaluated > no need for ischemic work up at this time Tele monitoring   AKI - improving/ resolved Hypokalemia, Hypomagnesemia, Hyperchloremia  Lactic acidosis - resolved. P:   Trend BMP / urinary output Replace electrolytes as indicated - KCL / Mg+ 8/20 Avoid nephrotoxic agents, ensure adequate renal perfusion Reduce lasix to 40 mg QD   Abd pain - see CT results above.  Intraabdominal process vs referred from UTI? Amylase/lipase negative.  H/o constipation  P:   NPO x ice chips  After 4 hours, advance to clear liquid as tolerated    Anemia of chronic disease VTE prophylaxis. P:  Trend CBC  Heparin for DVT prophylaxis    DM w/ hyperglycemia & hypoglycemia  P:   SSI, moderate scale     Acute encephalopathy - complicated by primary language barrier  Recent left knee surgery (6 weeks ago) P:   Minimize sedating medications as able  Frequent reorientation PT efforts    FAMILY  - Updates:  No family available am 8/20, will update on arrival.  Translator utilized am 8/20 to update patient.   - Global:  Transfer back to [facility] as of 8/21.  PCCM will follow up in am.   [name], NP-C [facility] Pgr: [phone] or if no answer [phone] 06/21/2017, 8:41 AM

## 2017-06-21 NOTE — Progress Notes (Signed)
Pt self extubated at [place]. RT called. Pt placed on 15 L HFNC. O2 sats currently 97%. PCCM paged. Awaiting response. RN will continue to monitor.

## 2017-06-21 NOTE — Care Management Note (Signed)
Case Management Note  Patient Details  Name: Christy [name] MRN: [id number] Date of Birth: 1941/10/28  Subjective/Objective:      Continues to need non rebreather o2              Action/Plan: Date:  June 21, 2017 Chart reviewed for concurrent status and case management needs. Will continue to follow patient progress. Discharge Planning: following for needs Expected discharge date: [id number] Velva Harman, BSN, Dante, Virgie  Expected Discharge Date:   (unknown)               Expected Discharge Plan:  Home/Self Care  In-House Referral:     Discharge planning Services  CM Consult  Post Acute Care Choice:    Choice offered to:     DME Arranged:    DME Agency:     HH Arranged:    [place] Agency:     Status of Service:  In process, will continue to follow  If discussed at Long Length of Stay Meetings, dates discussed:    Additional Comments:  Leeroy Cha, RN 06/21/2017, 9:11 AM

## 2017-06-21 NOTE — Progress Notes (Addendum)
Nutrition Follow-up  DOCUMENTATION CODES:   Severe malnutrition in context of acute illness/injury  INTERVENTION:  - Diet advancement as medically feasible. - RD will order appropriate oral nutrition supplements and/or snacks with diet advancement.   NUTRITION DIAGNOSIS:   Malnutrition related to acute illness as evidenced by percent weight loss, energy intake < or equal to 50% for > or equal to 5 days. -ongoing  GOAL:   Patient will meet greater than or equal to 90% of their needs -unable to meet  MONITOR:   Diet advancement, Labs, Weight trends, I & O's  ASSESSMENT:   76 y.o. female currently residing in a nursing home after deconditioning following a knee surgery in June. Recently found to have an ESBL Klebsiella pneumonia urinary tract infection. Treated briefly with Bactrim intravenously which was subsequently converted to oral. Initially the patient did improve then was noted to have decreased level of consciousness today and was hypotensive with systolic blood pressure in the 60-70s. Patient was found have a lactic acidosis as well as acute renal failure  8/20 Per review of chart, pt was intubated yesterday at ~1300 and self extubated this AM at [place]. Per PCCM NP note at ~0830 this AM, provide pt with ice chips and then advance to CLD in 4 hours as tolerated. Will continue to monitor for ongoing dit advancement and provide interventions as able. Weight trending back down toward admission weight and current weight is now -0.5 kg from admission weight.   Pt was NPO from time of admission until ~0910 on 8/16 when diet was advanced to CLD and then changed back to NPO approximately 24 hours later.   Medications reviewed; 40 mg IV Lasix/day, sliding scale Novolog, 2 g IV Mg sulfate x1 run today, 10 mEq IV KCl x4 runs today (K low last night), 10 mEq IV KCl x6 runs yesterday.  Labs reviewed; CBG: 149 mg/dL this AM, Na: 146 mmol/L, Cl: 114 mmol/L, BUN: 21 mg/dL, Ca: 7.7 mg/dL, Mg: 1.4  mg/dL.   8/15 - Visited pt's room today.  - Unable to communicate with pt as pt with AMS but spoke to family member at bedside who reports that pt has not been eating well for 2 months.  - She reports chronic constipation and states that pt will eat for a few days and then start having nausea and vomiting and will stop eating again.  - Family member reports that on several occasions pt has had to be manually disimpacted.  - Per chart, pt has lost 18lbs(8%) in 2 months; this is significant.  - Pt is currently NPO for CT scan today.  - Per MD note, need to rule out abdominal infection.   Nutrition-Focused physical exam completed. Findings are no fat depletion, no muscle depletion, and no edema.     Diet Order:  Diet NPO time specified  Skin:  Wound (see comment) (Stage 1 sacral and L heel pressure injuries)  Last BM:  8/19  Height:   Ht Readings from Last 1 Encounters:  06/20/17 5\' 7"  (1.702 m)    Weight:   Wt Readings from Last 1 Encounters:  06/21/17 192 lb 14.4 oz (87.5 kg)    Ideal Body Weight:  61.4 kg  BMI:  Body mass index is 30.21 kg/m.  Estimated Nutritional Needs:   Kcal:  1800-2100kcal/day   Protein:  88-105g/day   Fluid:  >1.8L/day   EDUCATION NEEDS:   Education needs no appropriate at this time    Jarome Matin, MS, RD, LDN,  CNSC Inpatient Clinical Dietitian Pager # [phone] After hours/weekend pager # [phone]

## 2017-06-22 ENCOUNTER — Inpatient Hospital Stay (HOSPITAL_COMMUNITY): Payer: Medicare Other

## 2017-06-22 LAB — GLUCOSE, CAPILLARY
GLUCOSE-CAPILLARY: 134 mg/dL — AB (ref 65–99)
Glucose-Capillary: 103 mg/dL — ABNORMAL HIGH (ref 65–99)
Glucose-Capillary: 111 mg/dL — ABNORMAL HIGH (ref 65–99)
Glucose-Capillary: 140 mg/dL — ABNORMAL HIGH (ref 65–99)
Glucose-Capillary: 161 mg/dL — ABNORMAL HIGH (ref 65–99)
Glucose-Capillary: 99 mg/dL (ref 65–99)

## 2017-06-22 LAB — CBC
HEMATOCRIT: 25.1 % — AB (ref 36.0–46.0)
Hemoglobin: 8.1 g/dL — ABNORMAL LOW (ref 12.0–15.0)
MCH: 29.3 pg (ref 26.0–34.0)
MCHC: 32.3 g/dL (ref 30.0–36.0)
MCV: 90.9 fL (ref 78.0–100.0)
Platelets: 304 10*3/uL (ref 150–400)
RBC: 2.76 MIL/uL — AB (ref 3.87–5.11)
RDW: 16.3 % — ABNORMAL HIGH (ref 11.5–15.5)
WBC: 9.2 10*3/uL (ref 4.0–10.5)

## 2017-06-22 LAB — BASIC METABOLIC PANEL
Anion gap: 6 (ref 5–15)
BUN: 19 mg/dL (ref 6–20)
CHLORIDE: 112 mmol/L — AB (ref 101–111)
CO2: 28 mmol/L (ref 22–32)
CREATININE: 0.61 mg/dL (ref 0.44–1.00)
Calcium: 7.8 mg/dL — ABNORMAL LOW (ref 8.9–10.3)
GFR calc non Af Amer: >60 mL/min (ref >60)
Glucose, Bld: 148 mg/dL — ABNORMAL HIGH (ref 65–99)
POTASSIUM: 3.6 mmol/L (ref 3.5–5.1)
SODIUM: 146 mmol/L — AB (ref 135–145)

## 2017-06-22 LAB — VANCOMYCIN, RANDOM: Vancomycin Rm: 21

## 2017-06-22 LAB — MAGNESIUM: Magnesium: 1.8 mg/dL (ref 1.7–2.4)

## 2017-06-22 MED ORDER — ALBUTEROL SULFATE (2.5 MG/3ML) 0.083% IN NEBU: 2.5000 mg | INHALATION_SOLUTION | RESPIRATORY_TRACT | Status: DC | PRN

## 2017-06-22 MED ORDER — RESOURCE THICKENUP CLEAR PO POWD
ORAL | Status: DC | PRN
  Filled 2017-06-22: qty 125

## 2017-06-22 MED ORDER — IPRATROPIUM-ALBUTEROL 0.5-2.5 (3) MG/3ML IN SOLN
3.0000 mL | Freq: Four times a day (QID) | RESPIRATORY_TRACT | Status: DC
  Administered 2017-06-23 – 2017-06-24 (×5): 3 mL via RESPIRATORY_TRACT
  Filled 2017-06-22 (×5): qty 3

## 2017-06-22 MED ORDER — VANCOMYCIN HCL IN DEXTROSE 750-5 MG/150ML-% IV SOLN
750.0000 mg | INTRAVENOUS | Status: DC
  Administered 2017-06-22 – 2017-06-24 (×3): 750 mg via INTRAVENOUS
  Filled 2017-06-22 (×4): qty 150

## 2017-06-22 MED ORDER — POTASSIUM CHLORIDE CRYS ER 20 MEQ PO TBCR
40.0000 meq | EXTENDED_RELEASE_TABLET | Freq: Once | ORAL | Status: AC
  Administered 2017-06-23: 40 meq via ORAL
  Filled 2017-06-22: qty 2

## 2017-06-22 MED ORDER — FUROSEMIDE 10 MG/ML IJ SOLN
40.0000 mg | Freq: Two times a day (BID) | INTRAMUSCULAR | Status: DC
  Administered 2017-06-22 – 2017-06-24 (×6): 40 mg via INTRAVENOUS
  Filled 2017-06-22 (×6): qty 4

## 2017-06-22 MED ORDER — MAGNESIUM SULFATE 2 GM/50ML IV SOLN
2.0000 g | Freq: Once | INTRAVENOUS | Status: AC
  Administered 2017-06-22: 2 g via INTRAVENOUS
  Filled 2017-06-22: qty 50

## 2017-06-22 NOTE — Progress Notes (Signed)
PULMONARY / CRITICAL CARE MEDICINE   Name: Christy [name] MRN: [id number] DOB: [birth year]    ADMISSION DATE:  06/15/2017 CONSULTATION DATE:  8/14  REFERRING MD:  Alvino Chapel   CHIEF COMPLAINT:  Septic shock  BRIEF SUMMARY:   76 y/o Female, SNF patient (resides there after knee surgery and deconditioning that followed since June 2018). Hx ESBL Klebsiella PNA UTI.  She developed weakness, RLQ discomfort and was treated with bactrim for UTI.  Found 8/14 by staff with decreased LOC, hypotensive with SBP in the 60-70's.  Lactic acid >7 on admit.  Treated with IVF resuscitation.  Cultures obtained and empiric abx started.  She required vasopressor support.  Urine culture was positive for Klebsiella pneumoniae & enterococcus faecalis.  She was weaned off vasopressors 8/16.  On 8/17 she developed respiratory distress and was transferred back to SDU for BiPAP.  She ultimately failed bipap due to emesis and was intubated 8/19.  She self extubated 8/20 am.      SUBJECTIVE:  Pt sitting in bed, no distress.  Asking for water.   VITAL SIGNS: BP 139/67   Pulse 90   Temp 98.8 F (37.1 C) (Axillary)   Resp (!) 21   Ht 5\' 7"  (1.702 m)   Wt 194 lb 14.2 oz (88.4 kg)   SpO2 96%   BMI 30.52 kg/m   HEMODYNAMICS:    VENTILATOR SETTINGS:    INTAKE / OUTPUT:  Intake/Output Summary (Last 24 hours) at 06/22/17 0919 Last data filed at 06/22/17 0700  Gross per 24 hour  Intake              995 ml  Output             1350 ml  Net             -355 ml     PHYSICAL EXAMINATION: General:  Well developed elderly female in NAD HEENT: MM pink/moist PSY: calm Neuro: Awake, alert, MAE CV: s1s2 rrr, no m/r/g PULM: even/non-labored, lungs bilaterally coarse with faint bibasilar crackles  JI:RCVE, non-tender, bsx4 active  Extremities: warm/dry, generalized 1+ edema  Skin: no rashes or lesions   LABS:  BMET  Recent Labs Lab 06/20/17 2201 06/21/17 0424 06/22/17 0500  NA 148* 146* 146*  K 2.8* 3.5  3.6  CL 114* 114* 112*  CO2 25 25 28   BUN 22* 21* 19  CREATININE 0.72 0.73 0.61  GLUCOSE 112* 158* 148*    Electrolytes  Recent Labs Lab 06/16/17 0358 06/17/17 0457 06/18/17 0330  06/20/17 2201 06/21/17 0424 06/22/17 0500  CALCIUM 8.3* 7.8* 7.9*  < > 7.8* 7.7* 7.8*  MG 1.5* 1.7 1.7  --   --  1.4* 1.8  PHOS 3.8 2.8  --   --   --   --   --   < > = values in this interval not displayed.  CBC  Recent Labs Lab 06/20/17 0632 06/21/17 0424 06/22/17 0500  WBC 8.8 9.2 9.2  HGB 9.0* 8.3* 8.1*  HCT 27.3* 25.5* 25.1*  PLT 243 260 304    Coag's  Recent Labs Lab 06/15/17 1647  APTT 33  INR 1.18    Sepsis Markers  Recent Labs Lab 06/15/17 1647  06/15/17 1847 06/15/17 2216 06/16/17 0358 06/16/17 0402 06/17/17 0457  LATICACIDVEN  --   < > 3.1* 2.8*  --  1.6  --   PROCALCITON 7.36  --   --   --  6.29  --  3.73  < > =  values in this interval not displayed.  ABG  Recent Labs Lab 06/15/17 1650 06/19/17 0525 06/20/17 1429  PHART 7.342* 7.266* 7.417  PCO2ART 29.7* 47.2 35.9  PO2ART 67.9* 101 220*    Liver Enzymes  Recent Labs Lab 06/15/17 1222 06/17/17 0457 06/19/17 1116  AST 29 22 19   ALT 17 17 18   ALKPHOS 102 210* 181*  BILITOT 0.5 0.4 0.7  ALBUMIN 3.0* 2.1* 1.9*    Cardiac Enzymes  Recent Labs Lab 06/18/17 1014 06/18/17 1530 06/18/17 2345  TROPONINI 0.05* 0.04* 0.03*    Glucose  Recent Labs Lab 06/21/17 1308 06/21/17 1551 06/21/17 1954 06/21/17 2329 06/22/17 0300 06/22/17 0806  GLUCAP 128* 178* 169* 99 140* 134*    Imaging Dg Chest Port 1 View  Result Date: 06/22/2017 CLINICAL DATA:  Acute respiratory failure with hypoxia. Sepsis. History of breast malignancy, hyperlipidemia, hypertension, former smoker. EXAM: PORTABLE CHEST 1 VIEW COMPARISON:  Portable chest x-ray of June 20, 2017 FINDINGS: The lungs are reasonably well inflated. Persistent interstitial infiltrates are present in the upper lobes with hazy density at both  bases. A small amount of pleural fluid on the right is suspected. The cardiac silhouette is enlarged. The pulmonary vascularity is prominent. The trachea and esophagus have been extubated. The right internal jugular venous catheter tip projects over the distal third of the SVC. IMPRESSION: Persistent bilateral pneumonia. Probable small right pleural effusion. Cardiomegaly without definite pulmonary edema. Electronically Signed   By: David  Martinique M.D.   On: 06/22/2017 07:27     STUDIES:  AbdUS 8/14 >> small echogenic focus in GB.  Slightly enlarged CBD up to 1cm.  No sonographic Murphy's.  Hepatic steatosis. CT A / P 8/15 >>mild wall thickening in the splenic flexure, descending & sigmoid colon & rectum with associated fat stranding (suggestive of nonspecific infectious / inflammatory colitis) no free air or abscess.  Moderate to large colorectal stool volume, dense consolidation with volume loss in the dependent bases (favor atelectasis).  Mildly distended gallbladder with minimal sludge vs tiny gallstones, dilated common bile duct without evidence of obstructing biliary mass  CULTURES: BCX 2 8/14 >>neg  UC 8/14 >> K.PNA (MDR but not ESBL producer, S- imipenem), E.Faecalis (S-ampicillin, Vanco)  ANTIBIOTICS: Merrem 8/14 >> Vanc 8/14 >>  SIGNIFICANT EVENTS: 8/14  Admit 8/15  Remains on 46mcg/min levophed.  CVP 7 this morning. C/o moderate abdominal pain with guarding. 8/16  Off pressors, VSS 8/17  PCCM to see for increased WOB 8/19  Intubated for respiratory distress 8/20  Self-extubated  LINES/TUBES: ETT 8/19 >> 8/20   DISCUSSION: 76 y/o F admitted with septic shock in setting of E Faecalis and K/.PNA UTI.  Developed SOB 8/18-8/19, intubated > self extubated 8/20.  CXR w/ progression of RUL airspace disease.     ASSESSMENT / PLAN:  Acute Hypoxic Respiratory Failure  Right upper lobe HCAP Never Smoker P: O2 as needed to support sats >92% Pulmonary hygiene - IS, mobilize, flutter   PRN BiPAP  ABX as above Assess swallowing given intubation / self extubation  Aspiration precautions    Sepsis in setting of MDR Klebsiella PNA and E Faecalis UTI RUL HCAP P: Continue Meropenem + Vanco D8/x abx  Consider 10 days total abx Monitor fever curve / WBC trend   LBBB, mild trop I elevation.  LVEF 55-60% possible anterior septal and apical inferior hypokinesis (new since May).  P: Tele monitoring  Cardiology evaluated pt > rec's for no ischemic work up at this time  AKI - improving/ resolved Hypokalemia, Hypomagnesemia, Hyperchloremia  Lactic acidosis - resolved. P:   Trend BMP / urinary output Replace electrolytes as indicated - KCL + Mg 8/21  Avoid nephrotoxic agents as able, ensure adequate renal perfusion Lasix 40 mg BID   Abd pain - see CT results above.  Intraabdominal process vs referred from UTI? Amylase/lipase negative.  H/o constipation  P:   NPO x sips with meds SLP evaluation given coughing with swallowing    Anemia of chronic disease VTE prophylaxis. P:  Trend CBC  Heparin for DVT prophylaxis    DM w/ hyperglycemia & hypoglycemia  P:   SSI moderate scale    Acute encephalopathy - complicated by primary language barrier  Recent left knee surgery (6 weeks ago) P:   Frequent reorientation  PT efforts  Mobilize Will likely need SNF rehab post hospitalization   FAMILY  - Updates: No family available am 8/21 for update  - Global:  PCCM will sign off.  Please call back if new needs arise.    [name], NP-C [facility] Pgr: [phone] or if no answer [phone] 06/22/2017, 9:19 AM

## 2017-06-22 NOTE — Progress Notes (Signed)
Patient ID: Christy [name], female   DOB: Apr 03, 1941, 76 y.o.   MRN: [id number]  PROGRESS NOTE    Christy [name]  TDD:[id number] DOB: [birth year] DOA: 06/15/2017  PCP: [name], MD   Brief Narrative:   76 year old female (speaks [name] language), from NH after knee surgery and deconditioning that followed in 04/2017, has history of ESBL Klebsiella pneumoniae UTI. She started to experience right lower quadrant abdominal pain. Her cx continued to grow ESBL and she was treated with IV bactrim which was then changed to PO. On 8/14, she was found by NH staff with decreased consciousness, hypotensive with SBP in 60-70's. Lactic was was 7. She remained hypotensive with fluid challenge so she required pressor support. Pt transitioned to [facility] care 8/17. Pt more rhonchorous on 8/17, transferred to SDU due to increased work of breathing. CCM consulted. CXR showed right upper lung lobe consolidation which is new since the admission.   Significant event: 8/15 - on Levophed 8/16 - off pressor support 8/17 - transferred back to SDU 8/18 - BiPAP   Assessment & Plan:   Active Problems:   Septic shock (HCC) / UTI ESBL - Septic shock on admission requiring pressor support from 8/14 - 8/16 - Urine culture - ESBL - Continue meropenem and vancomycin    Acute respiratory failure with hypoxia / HCAP  - Patient was intubated from August 19 through August 20, self extubated August 20 - Chest x-ray 06/22/2017 showed persistent bilateral pneumonia with probable small right pleural effusion and without definite pulmonary edema - Continue Lasix 40 mg IV daily - Continue meropenem and vancomycin    Acute kidney injury / Hypokalemia - Due to septic shock - Cr normalized     Hypokalemia - Due to sepsis - Supplemented     Abdominal pain - Pt has had CT scan on admission which showed possible inflammatory or infectious colitis and also dilated common bile duct mildly increased - Amylase, lipase  negative - No abd pain this am    Mild troponin elevation - Likely in the setting of septic shock, levels consistent with demand ischemia -Patient was seen by cardiology in consultation, ischemic workup not required at this time     Acute metabolic encephalopathy - Good mental status this am     Protein-calorie malnutrition, severe - In the context of acute on chronic illness - Seen by nutritionist     Stage 1 sacral ulcer and stage 2 lateral left heel - Seen by WOC - Sacral ulcer stage 1, MASD left medial buttock - Stage 2 lateral left heel - WOC recommended bilateral prevalon boots, ointment to MASD, foam dressing to sacrum for prevention; low air loss mattress   DVT prophylaxis: SCD's Code Status: full code  Family Communication: no family at the bedside this am Disposition Plan: remains in SDU   Consultants:   CCM  Cardiology   Nutrition   Procedures:   BiPAP  ETT 8/19 --> 8/20  Antimicrobials:   Merem 8/14 -->  Vanco 8/14 -->   Subjective: No overnight events.  Objective: Vitals:   06/22/17 0500 06/22/17 0600 06/22/17 0700 06/22/17 0754  BP: (!) 105/54 (!) 130/56 139/67   Pulse: 84 80 90   Resp: (!) 21 (!) 21 (!) 21   Temp:      TempSrc:      SpO2: 92% 92% 94% 96%  Weight: 88.4 kg (194 lb 14.2 oz)     Height:        Intake/Output Summary (  Last 24 hours) at 06/22/17 6773 Last data filed at 06/22/17 0700  Gross per 24 hour  Intake              995 ml  Output             1350 ml  Net             -355 ml   Filed Weights   06/20/17 0433 06/21/17 0500 06/22/17 0500  Weight: 85.7 kg (188 lb 15 oz) 87.5 kg (192 lb 14.4 oz) 88.4 kg (194 lb 14.2 oz)    Examination:  General exam: Appears calm and comfortable, more alert  Respiratory system: coarse breath sounds, rhonchorous, no wheezing  Cardiovascular system: S1 & S2 heard, Rate controlled  Gastrointestinal system: Abdomen is nondistended, soft and nontender. No organomegaly or masses  felt. Normal bowel sounds heard. Central nervous system: No focal neurological deficits. Extremities: Unna boots on, palpable pulses  Skin: warm, dry Psychiatry: Normal mood and behavior   Data Reviewed: I have personally reviewed following labs and imaging studies  CBC:  Recent Labs Lab 06/15/17 1222  06/18/17 0330 06/19/17 0801 06/20/17 0632 06/21/17 0424 06/22/17 0500  WBC 15.5*  < > 5.2 6.2 8.8 9.2 9.2  NEUTROABS 13.4*  --   --   --   --   --   --   HGB 11.4*  < > 8.9* 9.1* 9.0* 8.3* 8.1*  HCT 34.7*  < > 26.7* 27.8* 27.3* 25.5* 25.1*  MCV 87.6  < > 87.8 89.1 90.1 89.2 90.9  PLT 176  < > 194 248 243 260 304  < > = values in this interval not displayed. Basic Metabolic Panel:  Recent Labs Lab 06/16/17 0358 06/17/17 0457 06/18/17 0330 06/19/17 0801 06/20/17 0632 06/20/17 2201 06/21/17 0424 06/22/17 0500  NA 137 140 141 143 147* 148* 146* 146*  K 3.5 3.0* 3.3* 3.2* 2.7* 2.8* 3.5 3.6  CL 108 114* 115* 113* 115* 114* 114* 112*  CO2 19* 19* 21* '24 24 25 25 28  ' GLUCOSE 159* 105* 107* 148* 139* 112* 158* 148*  BUN 48* 36* 28* 24* 20 22* 21* 19  CREATININE 1.62* 0.89 0.72 0.71 0.63 0.72 0.73 0.61  CALCIUM 8.3* 7.8* 7.9* 8.1* 7.4* 7.8* 7.7* 7.8*  MG 1.5* 1.7 1.7  --   --   --  1.4* 1.8  PHOS 3.8 2.8  --   --   --   --   --   --    GFR: Estimated Creatinine Clearance: 68.3 mL/min (by C-G formula based on SCr of 0.61 mg/dL). Liver Function Tests:  Recent Labs Lab 06/15/17 1222 06/17/17 0457 06/19/17 1116  AST '29 22 19  ' ALT '17 17 18  ' ALKPHOS 102 210* 181*  BILITOT 0.5 0.4 0.7  PROT 6.3* 5.0* 5.2*  ALBUMIN 3.0* 2.1* 1.9*    Recent Labs Lab 06/16/17 0952  LIPASE 18  AMYLASE 85   No results for input(s): AMMONIA in the last 168 hours. Coagulation Profile:  Recent Labs Lab 06/15/17 1647  INR 1.18   Cardiac Enzymes:  Recent Labs Lab 06/16/17 0358 06/16/17 0952 06/18/17 1014 06/18/17 1530 06/18/17 2345  TROPONINI 0.10* 0.10* 0.05* 0.04* 0.03*    BNP (last 3 results) No results for input(s): PROBNP in the last 8760 hours. HbA1C: No results for input(s): HGBA1C in the last 72 hours. CBG:  Recent Labs Lab 06/21/17 1551 06/21/17 1954 06/21/17 2329 06/22/17 0300 06/22/17 0806  GLUCAP 178* 169* 99 140*  134*   Lipid Profile: No results for input(s): CHOL, HDL, LDLCALC, TRIG, CHOLHDL, LDLDIRECT in the last 72 hours. Thyroid Function Tests: No results for input(s): TSH, T4TOTAL, FREET4, T3FREE, THYROIDAB in the last 72 hours. Anemia Panel: No results for input(s): VITAMINB12, FOLATE, FERRITIN, TIBC, IRON, RETICCTPCT in the last 72 hours. Urine analysis:    Component Value Date/Time   COLORURINE RED (A) 06/15/2017 1320   APPEARANCEUR CLOUDY (A) 06/15/2017 1320   LABSPEC 1.013 06/15/2017 1320   PHURINE 7.0 06/15/2017 1320   GLUCOSEU NEGATIVE 06/15/2017 1320   HGBUR SMALL (A) 06/15/2017 1320   BILIRUBINUR NEGATIVE 06/15/2017 1320   KETONESUR NEGATIVE 06/15/2017 1320   PROTEINUR NEGATIVE 06/15/2017 1320   UROBILINOGEN 0.2 11/27/2013 2012   NITRITE NEGATIVE 06/15/2017 1320   LEUKOCYTESUR TRACE (A) 06/15/2017 1320   Sepsis Labs: '@[email or web address]' (procalcitonin:4,lacticidven:4)    Urine culture     Status: Abnormal   Collection Time: 06/15/17 12:18 PM  Result Value Ref Range Status   Specimen Description URINE, CATHETERIZED  Final   Special Requests NONE  Final   Culture (A)  Final    >=100,000 COLONIES/mL KLEBSIELLA PNEUMONIAE Confirmed Extended Spectrum Beta-Lactamase Producer (ESBL) >=100,000 COLONIES/mL ENTEROCOCCUS FAECALIS    Report Status 06/18/2017 FINAL  Final   Organism ID, Bacteria KLEBSIELLA PNEUMONIAE (A)  Final   Organism ID, Bacteria ENTEROCOCCUS FAECALIS (A)  Final      Susceptibility   Enterococcus faecalis - MIC*    AMPICILLIN <=2 SENSITIVE Sensitive     LEVOFLOXACIN >=8 RESISTANT Resistant     NITROFURANTOIN <=16 SENSITIVE Sensitive     VANCOMYCIN 1 SENSITIVE Sensitive     * >=100,000 COLONIES/mL  ENTEROCOCCUS FAECALIS   Klebsiella pneumoniae - MIC*    AMPICILLIN >=32 RESISTANT Resistant     CEFAZOLIN >=64 RESISTANT Resistant     CEFTRIAXONE >=64 RESISTANT Resistant     CIPROFLOXACIN >=4 RESISTANT Resistant     GENTAMICIN >=16 RESISTANT Resistant     IMIPENEM <=0.25 SENSITIVE Sensitive     NITROFURANTOIN 64 INTERMEDIATE Intermediate     TRIMETH/SULFA >=320 RESISTANT Resistant     AMPICILLIN/SULBACTAM >=32 RESISTANT Resistant     PIP/TAZO >=128 RESISTANT Resistant     Extended ESBL POSITIVE Resistant     * >=100,000 COLONIES/mL KLEBSIELLA PNEUMONIAE  Blood Culture (routine x 2)     Status: None   Collection Time: 06/15/17 12:22 PM  Result Value Ref Range Status   Specimen Description BLOOD LEFT ANTECUBITAL  Final   Special Requests   Final    BOTTLES DRAWN AEROBIC AND ANAEROBIC Blood Culture adequate volume   Culture   Final    NO GROWTH 5 DAYS Performed at [facility], [address] [place], [place] [address]    Report Status 06/20/2017 FINAL  Final  Blood Culture (routine x 2)     Status: None   Collection Time: 06/15/17 12:46 PM  Result Value Ref Range Status   Specimen Description BLOOD RIGHT ANTECUBITAL  Final   Special Requests   Final    BOTTLES DRAWN AEROBIC AND ANAEROBIC Blood Culture adequate volume   Culture   Final    NO GROWTH 5 DAYS Performed at [facility], [place] [address], [place], [place] [address]    Report Status 06/20/2017 FINAL  Final  MRSA PCR Screening     Status: Abnormal   Collection Time: 06/15/17  6:30 PM  Result Value Ref Range Status   MRSA by PCR POSITIVE (A) NEGATIVE Final  Radiology Studies: Dg Chest 1 View  Result Date: 06/20/2017 CLINICAL DATA:  Respiratory distress EXAM: CHEST 1 VIEW COMPARISON:  June 19, 2017 FINDINGS: No pneumothorax. Persistent infiltrate in the right upper lobe. Increased hazy opacity on the left. No other interval changes. Stable right IJ. IMPRESSION: 1. Stable infiltrate in the right  upper lobe. 2. Increased hazy opacity throughout the left lung. Recommend clinical correlation and attention on follow-up. Electronically Signed   By: Dorise Bullion III M.D   On: 06/20/2017 11:56   Dg Abd 1 View  Result Date: 06/20/2017 CLINICAL DATA:  76 year old female with a history of nasogastric tube placement EXAM: ABDOMEN - 1 VIEW COMPARISON:  CT 06/16/2017 FINDINGS: Limited plain film of the abdomen. Gastric tube courses over the low mediastinum in the upper abdomen, with the tip of the gastric tube terminating in the peripyloric region of the stomach. Gas within transverse colon, partially distended, similar to the comparison. Formed stool within the left colon. IMPRESSION: Limited plain film demonstrates gastric tube terminating in the peripyloric region of the stomach. Unchanged appearance of gaseous distention of transverse colon and formed stool in the left colon, similar prior CT. Electronically Signed   By: Corrie Mckusick D.O.   On: 06/20/2017 14:00   Dg Chest Port 1 View  Result Date: 06/22/2017 CLINICAL DATA:  Acute respiratory failure with hypoxia. Sepsis. History of breast malignancy, hyperlipidemia, hypertension, former smoker. EXAM: PORTABLE CHEST 1 VIEW COMPARISON:  Portable chest x-ray of June 20, 2017 FINDINGS: The lungs are reasonably well inflated. Persistent interstitial infiltrates are present in the upper lobes with hazy density at both bases. A small amount of pleural fluid on the right is suspected. The cardiac silhouette is enlarged. The pulmonary vascularity is prominent. The trachea and esophagus have been extubated. The right internal jugular venous catheter tip projects over the distal third of the SVC. IMPRESSION: Persistent bilateral pneumonia. Probable small right pleural effusion. Cardiomegaly without definite pulmonary edema. Electronically Signed   By: David  Martinique M.D.   On: 06/22/2017 07:27   Dg Chest Port 1 View  Result Date: 06/20/2017 CLINICAL DATA:   Encounter for intubation. EXAM: PORTABLE CHEST 1 VIEW COMPARISON:  06/20/2017 FINDINGS: Endotracheal tube tip is at the orifice of the right mainstem bronchus. 2-3 cm of retraction suggested. An orogastric tube reaches the stomach at least. Right IJ central line with tip at the upper cavoatrial junction. Better lung volumes. Asymmetric airspace opacity consistent with pneumonia in this patient with history of septic shock. No effusion or pneumothorax. Stable heart size. These results were called by telephone at the time of interpretation on 06/20/2017 at 1:59 pm to RN Sarah who verbally acknowledged these results. IMPRESSION: 1. Endotracheal tube tip at the right mainstem bronchus, suggest 2-3 cm of tube retraction. 2. New orogastric tube reaches the stomach. 3. Bilateral pneumonia.  Improved lung volumes. Electronically Signed   By: Monte Fantasia M.D.   On: 06/20/2017 14:00   Dg Chest Port 1 View  Result Date: 06/19/2017 CLINICAL DATA:  Acute onset of respiratory failure. Vomiting. Initial encounter. EXAM: PORTABLE CHEST 1 VIEW COMPARISON:  Chest radiograph performed 06/18/2017 FINDINGS: The lungs are hypoexpanded. There is persistent right apical airspace opacity, and increasing left apical and left basilar airspace opacities, concerning for multifocal pneumonia. No pleural effusion or pneumothorax is seen. The cardiomediastinal silhouette is mildly enlarged. No acute osseous abnormalities are identified. A right IJ line is noted ending about the distal SVC. IMPRESSION: 1. Lungs hypoexpanded. Persistent right apical airspace  opacity, and increasing left apical and left basilar airspace opacities, concerning for worsening multifocal pneumonia. 2. Mild cardiomegaly. Electronically Signed   By: Garald Balding M.D.   On: 06/19/2017 04:36      Scheduled Meds: . aspirin EC  81 mg Oral Daily  . chlorhexidine gluconate (MEDLINE KIT)  15 mL Mouth Rinse BID  . Chlorhexidine Gluconate Cloth  6 each Topical Daily   . furosemide  40 mg Intravenous Daily  . insulin aspart  0-15 Units Subcutaneous Q4H  . ipratropium-albuterol  3 mL Nebulization Q4H  . mouth rinse  15 mL Mouth Rinse QID  . sodium chloride flush  10-40 mL Intracatheter Q12H   Continuous Infusions: . sodium chloride    . meropenem (MERREM) IV 1 g (06/22/17 0820)  . vancomycin       LOS: 7 days    Time spent: 25 minutes  Greater than 50% of the time spent on counseling and coordinating the care.   Leisa Lenz, MD [facility] Pager [phone]  If 7PM-7AM, please contact night-coverage [email or web address] Password [email or web address] 06/22/2017, 8:22 AM

## 2017-06-22 NOTE — Progress Notes (Signed)
BSE completed, full report to follow.  Pt with hoarse vocal quality and weak cough.   She also demonstrates decreased respiratory strength = speaking only a 2-3 clear words per breath group before she demonstrates dysphonia.  No indication of airway compromise with nectar, ice, applesauce nor graham cracker (moistned).  Delayed cough observed with thin liquid consumption.    Using auditory interpreter for [name], pt did verbalize h/o coughing with intake prior to admit, but with repetition of question by interpreter she denied.  She also is edentulous and denies having dentures.  Vocal strength is normal per pt, but doubtful given recent intubation/self extubation use - suspect poor historian.    Recommend modified diet and follow up.  Suspect acute dysphagia due to pt's self extubation that will resolve.    Medicine with puree - whole Dys2/nectar Ice ok Straws ok   Christy [name], [place] [facility] SLP [phone]

## 2017-06-22 NOTE — Progress Notes (Signed)
Pharmacy: Re- vancomycin  Patient's a 76 y.o F on vancomycin for enterococcus coverage.  Random vancomycin level now back at 21 (~29 hrs after last dose).  Plan: - will resume vancomycin back as 750 mg IV q24h - f/u renal function  Dia Sitter, PharmD, BCPS 06/22/2017 6:44 AM

## 2017-06-22 NOTE — Evaluation (Signed)
Clinical/Bedside Swallow Evaluation Patient Details  Name: Christy [name] MRN: [id number] Date of Birth: [birth year]  Today's Date: 06/22/2017 Time: SLP Start Time (ACUTE ONLY): 1255 SLP Stop Time (ACUTE ONLY): 1317 SLP Time Calculation (min) (ACUTE ONLY): 22 min  Past Medical History:  Past Medical History:  Diagnosis Date  . Abnormal ECG    Inferior Q waves  . Cancer ([place])    stage II right breast cancer  . Cataract   . Diabetes mellitus   . Hypercholesterolemia   . Hyperlipidemia   . Hypertension   . Osteoporosis   . Overactive bladder    Past Surgical History:  Past Surgical History:  Procedure Laterality Date  . BREAST SURGERY     right lumpectomy, sentinel node biopsy  . CATARACT EXTRACTION Bilateral 2008  . FRACTURE SURGERY     ORIF right distal radius fx  . JOINT REPLACEMENT     right hip  . ORIF RADIAL FRACTURE Bilateral 03/29/2017   Procedure: OPEN REDUCTION INTERNAL FIXATION (ORIF) RADIAL FRACTURES;  Surgeon: Iran Planas, MD;  Location: [place];  Service: Orthopedics;  Laterality: Bilateral;  . PATELLECTOMY Left 03/31/2017   Procedure: LEFT PARTIAL PATELLECTOMY;  Surgeon: Leandrew Koyanagi, MD;  Location: [place];  Service: Orthopedics;  Laterality: Left;  . PORT-A-CATH REMOVAL  09/11/2011   Procedure: REMOVAL PORT-A-CATH;  Surgeon: Rolm Bookbinder, MD;  Location: [place];  Service: General;  Laterality: Left;  local   . PORTACATH PLACEMENT     HPI:  76 yo female adm to [facility] with AMS from SNF on 06/15/17.  Pt developed respiratory deficits and was intubated on 8/19- self extubated 06/21/17.     Pt with PMH + for former smoker, breast malignancy, knee surgery with post op deconditioning, UTI.  Swallow evaluation ordered.  Pt speaks [name].     Assessment / Plan / Recommendation Clinical Impression  Pt with hoarse vocal quality and weak cough.   She also demonstrates decreased respiratory strength = speaking only a 2-3 clear words per breath group before  she demonstrates dysphonia.  No indication of airway compromise with nectar, ice, applesauce nor graham cracker (moistned).  Delayed cough observed with thin liquid consumption.  Using auditory interpreter via [place] for [name], pt did verbalize h/o coughing with intake prior to admit, but with repetition of question by interpreter she denied.  She also is edentulous and denies having dentures.  Vocal strength is normal per pt, but doubtful given recent intubation/self extubation use - suspect poor historian.  Recommend modified diet and follow up.  Suspect acute dysphagia due to pt's self extubation that will resolve. SLP Visit Diagnosis: Dysphagia, oropharyngeal phase (R13.12)    Aspiration Risk  Mild aspiration risk    Diet Recommendation Dysphagia 2 (Fine chop);Nectar-thick liquid;Ice chips PRN after oral care   Liquid Administration via: Cup;Straw Medication Administration: Whole meds with puree Supervision: Full supervision/cueing for compensatory strategies Compensations: Slow rate;Small sips/bites Postural Changes: Remain upright for at least 30 minutes after po intake;Seated upright at 90 degrees    Other  Recommendations Oral Care Recommendations: Oral care BID Other Recommendations: Order thickener from pharmacy   Follow up Recommendations        Frequency and Duration min 1 x/week  1 week       Prognosis Prognosis for Safe Diet Advancement: Good      Swallow Study   General Date of Onset: 06/22/17 HPI: 76 yo female adm to [facility] with AMS from SNF on 06/15/17.  Pt developed  respiratory deficits and was intubated on 8/19- self extubated 06/21/17.     Pt with PMH + for former smoker, breast malignancy, knee surgery with post op deconditioning, UTI.  Swallow evaluation ordered.  Pt speaks [name].   Type of Study: Bedside Swallow Evaluation Diet Prior to this Study: NPO Temperature Spikes Noted: No Respiratory Status: Nasal cannula History of Recent Intubation:  No Behavior/Cognition: Alert;Cooperative;Pleasant mood Oral Cavity Assessment: Within Functional Limits Oral Care Completed by SLP: No Oral Cavity - Dentition: Edentulous Vision: Functional for self-feeding Self-Feeding Abilities: Needs assist Patient Positioning: Upright in bed Baseline Vocal Quality: Low vocal intensity;Hoarse;Breathy Volitional Cough: Weak Volitional Swallow: Unable to elicit    Oral/Motor/Sensory Function Overall Oral Motor/Sensory Function: Within functional limits   Ice Chips Ice chips: Within functional limits Presentation: Spoon   Thin Liquid Thin Liquid: Impaired Presentation: Cup;Straw Pharyngeal  Phase Impairments: Cough - Delayed    Nectar Thick Nectar Thick Liquid: Within functional limits Presentation: Cup;Self Fed   Honey Thick Honey Thick Liquid: Not tested   Puree Puree: Within functional limits Presentation: Spoon   Solid   GO   Solid: Impaired Oral Phase Impairments: Impaired mastication (pt is edentulous)        Macario Golds 06/22/2017,1:59 PM  Luanna Salk, [place] [facility] SLP [phone]

## 2017-06-22 NOTE — Progress Notes (Signed)
CSW assisting with dc planning. Pt will return to [place] [facility] once stable for dc. CSW will continue to follow to assist with dc planning needs.  Werner Lean LCSW [phone]

## 2017-06-23 DIAGNOSIS — R531 Weakness: Secondary | ICD-10-CM

## 2017-06-23 DIAGNOSIS — B9689 Other specified bacterial agents as the cause of diseases classified elsewhere: Secondary | ICD-10-CM

## 2017-06-23 DIAGNOSIS — N390 Urinary tract infection, site not specified: Secondary | ICD-10-CM

## 2017-06-23 LAB — BASIC METABOLIC PANEL
Anion gap: 5 (ref 5–15)
BUN: 13 mg/dL (ref 6–20)
CHLORIDE: 104 mmol/L (ref 101–111)
CO2: 31 mmol/L (ref 22–32)
Calcium: 7.4 mg/dL — ABNORMAL LOW (ref 8.9–10.3)
Creatinine, Ser: 0.42 mg/dL — ABNORMAL LOW (ref 0.44–1.00)
GFR calc non Af Amer: >60 mL/min (ref >60)
Glucose, Bld: 119 mg/dL — ABNORMAL HIGH (ref 65–99)
POTASSIUM: 3 mmol/L — AB (ref 3.5–5.1)
SODIUM: 140 mmol/L (ref 135–145)

## 2017-06-23 LAB — GLUCOSE, CAPILLARY
GLUCOSE-CAPILLARY: 107 mg/dL — AB (ref 65–99)
Glucose-Capillary: 112 mg/dL — ABNORMAL HIGH (ref 65–99)
Glucose-Capillary: 117 mg/dL — ABNORMAL HIGH (ref 65–99)
Glucose-Capillary: 189 mg/dL — ABNORMAL HIGH (ref 65–99)
Glucose-Capillary: 156 mg/dL — ABNORMAL HIGH (ref 65–99)
Glucose-Capillary: 154 mg/dL — ABNORMAL HIGH (ref 65–99)

## 2017-06-23 LAB — CBC
HEMATOCRIT: 25.3 % — AB (ref 36.0–46.0)
HEMOGLOBIN: 8.2 g/dL — AB (ref 12.0–15.0)
MCH: 29.2 pg (ref 26.0–34.0)
MCHC: 32.4 g/dL (ref 30.0–36.0)
MCV: 90 fL (ref 78.0–100.0)
Platelets: 321 10*3/uL (ref 150–400)
RBC: 2.81 MIL/uL — AB (ref 3.87–5.11)
RDW: 15.8 % — ABNORMAL HIGH (ref 11.5–15.5)
WBC: 7.8 10*3/uL (ref 4.0–10.5)

## 2017-06-23 MED ORDER — POTASSIUM CHLORIDE 10 MEQ/100ML IV SOLN
10.0000 meq | Freq: Once | INTRAVENOUS | Status: AC
  Administered 2017-06-23: 10 meq via INTRAVENOUS
  Filled 2017-06-23: qty 100

## 2017-06-23 MED ORDER — HYDROCODONE-ACETAMINOPHEN 5-325 MG PO TABS
1.0000 | ORAL_TABLET | Freq: Four times a day (QID) | ORAL | Status: DC | PRN
  Administered 2017-06-23 – 2017-06-24 (×2): 1 via ORAL
  Filled 2017-06-23 (×4): qty 1

## 2017-06-23 MED ORDER — POTASSIUM CHLORIDE 10 MEQ/100ML IV SOLN
10.0000 meq | INTRAVENOUS | Status: AC
  Administered 2017-06-23 (×3): 10 meq via INTRAVENOUS
  Filled 2017-06-23 (×3): qty 100

## 2017-06-23 NOTE — Progress Notes (Addendum)
PROGRESS NOTE  Christy [name]  TZG:[id number] DOB: 06/24/1941 DOA: 06/15/2017 PCP: [name], MD  Brief Narrative:   76 year old female (speaks [name] language), from NH after knee surgery and deconditioning that followed in 04/2017, has history of ESBL Klebsiella pneumoniae UTI. She started to experience right lower quadrant abdominal pain. Her cx grew ESBL and she was treated with IV bactrim which was then changed to PO. On 8/14, she was found by NH staff with decreased consciousness, hypotensive with SBP in 60-70's. Lactic was was 7. She remained hypotensive with fluid challenge so she required pressor support and was admitted to the ICU on PCCM service.  Pt transitioned to [facility] care 8/17. Pt more rhonchorous on 8/17, transferred to SDU due to increased work of breathing. CCM consulted. CXR showed right upper lung lobe consolidation which is new since the admission.  Significant event: 8/15 - on Levophed 8/16 - off pressor support 8/17 - transferred back to SDU 8/18 - BiPAP  Assessment & Plan:   Active Problems:   Septic shock (HCC)   Pressure injury of skin   Protein-calorie malnutrition, severe   Acute respiratory failure with hypoxia (HCC)  Septic shock (HCC) / UTI ESBL - Septic shock on admission requiring pressor support from 8/14 - 8/16 - Urine culture - ESBL - Continue meropenem and vancomycin day 8 of 10  Acute respiratory failure with hypoxia / HCAP  - Patient was intubated from August 19 through August 20, self extubated August 20 - Chest x-ray 06/22/2017 showed persistent bilateral pneumonia with probable small right pleural effusion and without definite pulmonary edema - Continue Lasix 40 mg IV daily - Continue meropenem and vancomycin  Acute kidney injury / Hypokalemia - Due to septic shock - Cr normalized   Hypokalemia - Due to sepsis - additional potassium today  Abdominal pain, resolved. - Pt has had CT scan on admission which showed  possible inflammatory or infectious colitis and also dilated common bile duct mildly increased - Amylase, lipase negative  Mild troponin elevation 0.05 - Likely in the setting of septic shock, levels consistent with demand ischemia -Patient was seen by cardiology in consultation, ischemic workup not required at this time   Acute metabolic encephalopathy - Good mental status this am   Protein-calorie malnutrition, severe - In the context of acute on chronic illness - Seen by nutritionist   Stage 1 sacral ulcer and stage 2 lateral left heel - Seen by WOC - Sacral ulcer stage 1, MASD left medial buttock - Stage 2 lateral left heel - WOC recommended bilateral prevalon boots, ointment to MASD, foam dressing to sacrum for prevention; low air loss mattress  DVT prophylaxis: SCD's Code Status: full code  Family Communication: no family at the bedside this am Disposition Plan: transfer to med-surg.  PT/OT consultations.     Consultants:   PCCM to Hospitalist transfer  Procedures:  none  Antimicrobials:  Anti-infectives    Start     Dose/Rate Route Frequency Ordered Stop   06/22/17 0800  vancomycin (VANCOCIN) IVPB 750 mg/150 ml premix     750 mg 150 mL/hr over 60 Minutes Intravenous Every 24 hours 06/22/17 0645     06/18/17 1200  vancomycin (VANCOCIN) IVPB 1000 mg/200 mL premix  Status:  Discontinued     1,000 mg 200 mL/hr over 60 Minutes Intravenous Every 12 hours 06/18/17 1013 06/21/17 1307   06/17/17 1600  meropenem (MERREM) 1 g in sodium chloride 0.9 % 100 mL IVPB  1 g 200 mL/hr over 30 Minutes Intravenous Every 8 hours 06/17/17 1149     06/16/17 2200  meropenem (MERREM) 1 g in sodium chloride 0.9 % 100 mL IVPB  Status:  Discontinued     1 g 200 mL/hr over 30 Minutes Intravenous Every 12 hours 06/16/17 1034 06/17/17 1149   06/16/17 2000  vancomycin (VANCOCIN) IVPB 1000 mg/200 mL premix  Status:  Discontinued     1,000 mg 200 mL/hr over 60 Minutes Intravenous  Every 24 hours 06/15/17 1939 06/16/17 1034   06/16/17 1800  vancomycin (VANCOCIN) 1,250 mg in sodium chloride 0.9 % 250 mL IVPB  Status:  Discontinued     1,250 mg 166.7 mL/hr over 90 Minutes Intravenous Every 24 hours 06/16/17 1034 06/17/17 1058   06/15/17 2200  meropenem (MERREM) 1 g in sodium chloride 0.9 % 100 mL IVPB  Status:  Discontinued     1 g 200 mL/hr over 30 Minutes Intravenous Every 12 hours 06/15/17 1939 06/15/17 2025   06/15/17 2200  meropenem (MERREM) 500 mg in sodium chloride 0.9 % 50 mL IVPB  Status:  Discontinued     500 mg 100 mL/hr over 30 Minutes Intravenous Every 12 hours 06/15/17 2025 06/16/17 1034   06/15/17 1815  vancomycin (VANCOCIN) IVPB 1000 mg/200 mL premix     1,000 mg 200 mL/hr over 60 Minutes Intravenous  Once 06/15/17 1809 06/15/17 2000   06/15/17 1245  ertapenem (INVANZ) 1 g in sodium chloride 0.9 % 50 mL IVPB  Status:  Discontinued     1 g 100 mL/hr over 30 Minutes Intravenous Daily 06/15/17 1221 06/15/17 1636       Subjective:  [place] interpreter (ipad).  Denies abdominal pain, chest pain, SOB.  Coughing some.  Feels weak and has not been out of bed recently.    Objective: Vitals:   06/23/17 1000 06/23/17 1200 06/23/17 1257 06/23/17 1335  BP: (!) 118/54 (!) 122/51  124/65  Pulse: 81 91  91  Resp: (!) 32 (!) 21    Temp:    99.1 F (37.3 C)  TempSrc:    Oral  SpO2: 98% 96% 91% 95%  Weight:      Height:        Intake/Output Summary (Last 24 hours) at 06/23/17 1402 Last data filed at 06/23/17 1336  Gross per 24 hour  Intake              485 ml  Output             3826 ml  Net            -3341 ml   Filed Weights   06/21/17 0500 06/22/17 0500 06/23/17 0252  Weight: 87.5 kg (192 lb 14.4 oz) 88.4 kg (194 lb 14.2 oz) 88.5 kg (195 lb 1.7 oz)    Examination:  General exam:  Adult female, head hanging forward and too weak to keep upright.  No acute distress.  HEENT:  NCAT, MMM Respiratory system: rhonchorous bilateral breath sounds,  no wheezes or focal rales Cardiovascular system: Regular rate and rhythm, normal S1/S2. No murmurs, rubs, gallops or clicks.  Warm extremities Gastrointestinal system: Normal active bowel sounds, soft, nondistended, nontender. MSK:  Normal tone and bulk, no lower extremity edema Neuro:  Unable to hold up head.  Upper extremities 4/5 bilaterally, face symmetric.  Lower extremities 3/5 bilaterally.  Sensation intact to light touch.      Data Reviewed: I have personally reviewed following labs and imaging  studies  CBC:  Recent Labs Lab 06/19/17 0801 06/20/17 0632 06/21/17 0424 06/22/17 0500 06/23/17 0321  WBC 6.2 8.8 9.2 9.2 7.8  HGB 9.1* 9.0* 8.3* 8.1* 8.2*  HCT 27.8* 27.3* 25.5* 25.1* 25.3*  MCV 89.1 90.1 89.2 90.9 90.0  PLT 248 243 260 304 824   Basic Metabolic Panel:  Recent Labs Lab 06/17/17 0457 06/18/17 0330  06/20/17 0632 06/20/17 2201 06/21/17 0424 06/22/17 0500 06/23/17 0321  NA 140 141  < > 147* 148* 146* 146* 140  K 3.0* 3.3*  < > 2.7* 2.8* 3.5 3.6 3.0*  CL 114* 115*  < > 115* 114* 114* 112* 104  CO2 19* 21*  < > _0 GLUCOSE 105* 107*  < > 139* 112* 158* 148* 119*  BUN 36* 28*  < > 20 22* 21* 19 13  CREATININE 0.89 0.72  < > 0.63 0.72 0.73 0.61 0.42*  CALCIUM 7.8* 7.9*  < > 7.4* 7.8* 7.7* 7.8* 7.4*  MG 1.7 1.7  --   --   --  1.4* 1.8  --   PHOS 2.8  --   --   --   --   --   --   --   < > = values in this interval not displayed. GFR: Estimated Creatinine Clearance: 68.4 mL/min (A) (by C-G formula based on SCr of 0.42 mg/dL (L)). Liver Function Tests:  Recent Labs Lab 06/17/17 0457 06/19/17 1116  AST 22 19  ALT 17 18  ALKPHOS 210* 181*  BILITOT 0.4 0.7  PROT 5.0* 5.2*  ALBUMIN 2.1* 1.9*   No results for input(s): LIPASE, AMYLASE in the last 168 hours. No results for input(s): AMMONIA in the last 168 hours. Coagulation Profile: No results for input(s): INR, PROTIME in the last 168 hours. Cardiac Enzymes:  Recent Labs Lab  06/18/17 1014 06/18/17 1530 06/18/17 2345  TROPONINI 0.05* 0.04* 0.03*   BNP (last 3 results) No results for input(s): PROBNP in the last 8760 hours. HbA1C: No results for input(s): HGBA1C in the last 72 hours. CBG:  Recent Labs Lab 06/22/17 1940 06/22/17 2340 06/23/17 0408 06/23/17 0748 06/23/17 1138  GLUCAP 103* 111* 112* 117* 189*   Lipid Profile: No results for input(s): CHOL, HDL, LDLCALC, TRIG, CHOLHDL, LDLDIRECT in the last 72 hours. Thyroid Function Tests: No results for input(s): TSH, T4TOTAL, FREET4, T3FREE, THYROIDAB in the last 72 hours. Anemia Panel: No results for input(s): VITAMINB12, FOLATE, FERRITIN, TIBC, IRON, RETICCTPCT in the last 72 hours. Urine analysis:    Component Value Date/Time   COLORURINE RED (A) 06/15/2017 1320   APPEARANCEUR CLOUDY (A) 06/15/2017 1320   LABSPEC 1.013 06/15/2017 1320   PHURINE 7.0 06/15/2017 1320   GLUCOSEU NEGATIVE 06/15/2017 1320   HGBUR SMALL (A) 06/15/2017 1320   BILIRUBINUR NEGATIVE 06/15/2017 1320   KETONESUR NEGATIVE 06/15/2017 1320   PROTEINUR NEGATIVE 06/15/2017 1320   UROBILINOGEN 0.2 11/27/2013 2012   NITRITE NEGATIVE 06/15/2017 1320   LEUKOCYTESUR TRACE (A) 06/15/2017 1320   Sepsis Labs: _1 (procalcitonin:4,lacticidven:4)  ) Recent Results (from the past 240 hour(s))  Urine culture     Status: Abnormal   Collection Time: 06/15/17 12:18 PM  Result Value Ref Range Status   Specimen Description URINE, CATHETERIZED  Final   Special Requests NONE  Final   Culture (A)  Final    >=100,000 COLONIES/mL KLEBSIELLA PNEUMONIAE Confirmed Extended Spectrum Beta-Lactamase Producer (ESBL) >=100,000 COLONIES/mL ENTEROCOCCUS FAECALIS    Report Status 06/18/2017 FINAL  Final  Organism ID, Bacteria KLEBSIELLA PNEUMONIAE (A)  Final   Organism ID, Bacteria ENTEROCOCCUS FAECALIS (A)  Final      Susceptibility   Enterococcus faecalis - MIC*    AMPICILLIN <=2 SENSITIVE Sensitive     LEVOFLOXACIN >=8 RESISTANT  Resistant     NITROFURANTOIN <=16 SENSITIVE Sensitive     VANCOMYCIN 1 SENSITIVE Sensitive     * >=100,000 COLONIES/mL ENTEROCOCCUS FAECALIS   Klebsiella pneumoniae - MIC*    AMPICILLIN >=32 RESISTANT Resistant     CEFAZOLIN >=64 RESISTANT Resistant     CEFTRIAXONE >=64 RESISTANT Resistant     CIPROFLOXACIN >=4 RESISTANT Resistant     GENTAMICIN >=16 RESISTANT Resistant     IMIPENEM <=0.25 SENSITIVE Sensitive     NITROFURANTOIN 64 INTERMEDIATE Intermediate     TRIMETH/SULFA >=320 RESISTANT Resistant     AMPICILLIN/SULBACTAM >=32 RESISTANT Resistant     PIP/TAZO >=128 RESISTANT Resistant     Extended ESBL POSITIVE Resistant     * >=100,000 COLONIES/mL KLEBSIELLA PNEUMONIAE  Blood Culture (routine x 2)     Status: None   Collection Time: 06/15/17 12:22 PM  Result Value Ref Range Status   Specimen Description BLOOD LEFT ANTECUBITAL  Final   Special Requests   Final    BOTTLES DRAWN AEROBIC AND ANAEROBIC Blood Culture adequate volume   Culture   Final    NO GROWTH 5 DAYS Performed at [facility], [address] [place], [place] [address]    Report Status 06/20/2017 FINAL  Final  Blood Culture (routine x 2)     Status: None   Collection Time: 06/15/17 12:46 PM  Result Value Ref Range Status   Specimen Description BLOOD RIGHT ANTECUBITAL  Final   Special Requests   Final    BOTTLES DRAWN AEROBIC AND ANAEROBIC Blood Culture adequate volume   Culture   Final    NO GROWTH 5 DAYS Performed at [facility], [place] [address], [place],  [address]    Report Status 06/20/2017 FINAL  Final  MRSA PCR Screening     Status: Abnormal   Collection Time: 06/15/17  6:30 PM  Result Value Ref Range Status   MRSA by PCR POSITIVE (A) NEGATIVE Final    Comment:        The GeneXpert MRSA Assay (FDA approved for NASAL specimens only), is one component of a comprehensive MRSA colonization surveillance program. It is not intended to diagnose MRSA infection nor to guide  or monitor treatment for MRSA infections. RESULT CALLED TO, READ BACK BY AND VERIFIED WITH: Audree Camel [id number] @ 2205 BY J SCOTTON       Radiology Studies: Dg Chest Port 1 View  Result Date: 06/22/2017 CLINICAL DATA:  Acute respiratory failure with hypoxia. Sepsis. History of breast malignancy, hyperlipidemia, hypertension, former smoker. EXAM: PORTABLE CHEST 1 VIEW COMPARISON:  Portable chest x-ray of June 20, 2017 FINDINGS: The lungs are reasonably well inflated. Persistent interstitial infiltrates are present in the upper lobes with hazy density at both bases. A small amount of pleural fluid on the right is suspected. The cardiac silhouette is enlarged. The pulmonary vascularity is prominent. The trachea and esophagus have been extubated. The right internal jugular venous catheter tip projects over the distal third of the SVC. IMPRESSION: Persistent bilateral pneumonia. Probable small right pleural effusion. Cardiomegaly without definite pulmonary edema. Electronically Signed   By: David  Martinique M.D.   On: 06/22/2017 07:27     Scheduled Meds: . aspirin EC  81  mg Oral Daily  . chlorhexidine gluconate (MEDLINE KIT)  15 mL Mouth Rinse BID  . Chlorhexidine Gluconate Cloth  6 each Topical Daily  . furosemide  40 mg Intravenous BID  . insulin aspart  0-15 Units Subcutaneous Q4H  . ipratropium-albuterol  3 mL Nebulization QID  . mouth rinse  15 mL Mouth Rinse QID  . sodium chloride flush  10-40 mL Intracatheter Q12H   Continuous Infusions: . sodium chloride    . meropenem (MERREM) IV Stopped (06/23/17 0819)  . potassium chloride 10 mEq (06/23/17 1330)  . vancomycin Stopped (06/23/17 1113)     LOS: 8 days    Time spent: 30 min    Janece Canterbury, MD [facility] Pager [phone]  If 7PM-7AM, please contact night-coverage [email or web address] Password [email or web address] 06/23/2017, 2:02 PM

## 2017-06-23 NOTE — Progress Notes (Signed)
  Speech Language Pathology Treatment: Dysphagia  Patient Details Name: Christy [name] MRN: [id number] DOB: [birth year] Today's Date: 06/23/2017 Time: 0539-7673 SLP Time Calculation (min) (ACUTE ONLY): 26 min  Assessment / Plan / Recommendation Clinical Impression  Ipad interpreter used - auditory [name] language.  Pt continues with dysphonia that appears worse today than yesterday. She denies shortness of breath nor pain.  Pt verbalizing 1-2 words - very breathy quality. She reports prior to admission that her voice was strong - and admits to voice changes/weakness.  Educated pt to concerns for aspiration of thin liquids due to self extubation/possible edema.  Pt admits she did not cough with intake prior to admission.  Observed pt consuming thin water, nectar thickened water, applesauce -  Overt cough immediately post-swallow of  Thin water via straw - concerning for aspiration.  Pt dislikes thickened water c/b her grimacing after swallow - but after showing pt it was water, she was more willing to accept.  Asked ambassador if pt could have thick water with meals.  Will follow up for readiness for dietary advancement, po tolerance.  Do NOT recommend advancing diet a this time.  Advised pt to need to strengthen cough and voice.    HPI HPI: 76 yo female adm to [facility] with AMS from SNF on 06/15/17.  Pt developed respiratory deficits and was intubated on 8/19- self extubated 06/21/17.     Pt with PMH + for former smoker, breast malignancy, knee surgery with post op deconditioning, UTI.  Swallow evaluation ordered.  Pt speaks [name].        SLP Plan          Recommendations  Diet recommendations: Dysphagia 2 (fine chop);Nectar-thick liquid Liquids provided via: Straw Medication Administration: Whole meds with puree Supervision: Full supervision/cueing for compensatory strategies Compensations: Slow rate;Small sips/bites Postural Changes and/or Swallow Maneuvers: Seated upright 90 degrees;Upright  30-60 min after meal                Oral Care Recommendations: Oral care BID Follow up Recommendations:  (tbd)       GO                Christy [name], Christy [name] 06/23/2017, 8:20 AM Christy [name], [place] [facility] SLP [phone]

## 2017-06-24 DIAGNOSIS — E43 Unspecified severe protein-calorie malnutrition: Secondary | ICD-10-CM

## 2017-06-24 LAB — BASIC METABOLIC PANEL
Anion gap: 9 (ref 5–15)
BUN: 14 mg/dL (ref 6–20)
CALCIUM: 7.6 mg/dL — AB (ref 8.9–10.3)
CHLORIDE: 100 mmol/L — AB (ref 101–111)
CO2: 32 mmol/L (ref 22–32)
CREATININE: 0.62 mg/dL (ref 0.44–1.00)
Glucose, Bld: 117 mg/dL — ABNORMAL HIGH (ref 65–99)
Potassium: 3.4 mmol/L — ABNORMAL LOW (ref 3.5–5.1)
SODIUM: 141 mmol/L (ref 135–145)

## 2017-06-24 LAB — CBC
HCT: 26.7 % — ABNORMAL LOW (ref 36.0–46.0)
Hemoglobin: 8.5 g/dL — ABNORMAL LOW (ref 12.0–15.0)
MCH: 28.5 pg (ref 26.0–34.0)
MCHC: 31.8 g/dL (ref 30.0–36.0)
MCV: 89.6 fL (ref 78.0–100.0)
PLATELETS: 391 10*3/uL (ref 150–400)
RBC: 2.98 MIL/uL — AB (ref 3.87–5.11)
RDW: 15.5 % (ref 11.5–15.5)
WBC: 7.2 10*3/uL (ref 4.0–10.5)

## 2017-06-24 LAB — GLUCOSE, CAPILLARY
GLUCOSE-CAPILLARY: 104 mg/dL — AB (ref 65–99)
GLUCOSE-CAPILLARY: 107 mg/dL — AB (ref 65–99)
GLUCOSE-CAPILLARY: 114 mg/dL — AB (ref 65–99)
GLUCOSE-CAPILLARY: 116 mg/dL — AB (ref 65–99)
GLUCOSE-CAPILLARY: 121 mg/dL — AB (ref 65–99)
Glucose-Capillary: 134 mg/dL — ABNORMAL HIGH (ref 65–99)

## 2017-06-24 MED ORDER — IPRATROPIUM-ALBUTEROL 0.5-2.5 (3) MG/3ML IN SOLN
3.0000 mL | Freq: Three times a day (TID) | RESPIRATORY_TRACT | Status: DC
  Administered 2017-06-24: 3 mL via RESPIRATORY_TRACT
  Filled 2017-06-24 (×3): qty 3

## 2017-06-24 MED ORDER — POTASSIUM CHLORIDE CRYS ER 20 MEQ PO TBCR
40.0000 meq | EXTENDED_RELEASE_TABLET | Freq: Once | ORAL | Status: AC
  Administered 2017-06-24: 40 meq via ORAL
  Filled 2017-06-24: qty 2

## 2017-06-24 MED ORDER — ENSURE ENLIVE PO LIQD: 237.0000 mL | Freq: Two times a day (BID) | ORAL | Status: DC

## 2017-06-24 MED ORDER — MIRTAZAPINE 15 MG PO TABS
7.5000 mg | ORAL_TABLET | Freq: Every day | ORAL | Status: DC
  Administered 2017-06-24: 7.5 mg via ORAL
  Filled 2017-06-24: qty 1

## 2017-06-24 NOTE — Progress Notes (Signed)
Nutrition Follow-up  DOCUMENTATION CODES:   Severe malnutrition in context of acute illness/injury  INTERVENTION:   Provide Mighty Shake II BID between meals, each supplement provides 480-500 kcals and 20-23 grams of protein. Supplement is nectar thick.  RD will continue to monitor  NUTRITION DIAGNOSIS:   Malnutrition (severe)related to acute illness as evidenced by percent weight loss, energy intake < or equal to 50% for > or equal to 5 days.  Ongoing.  GOAL:   Patient will meet greater than or equal to 90% of their needs  Not meeting.  MONITOR:   PO intake, Supplement acceptance, Labs, Weight trends, Skin, I & O's  ASSESSMENT:   76 y.o. female currently residing in a nursing home after deconditioning following a knee surgery in June. Recently found to have an ESBL Klebsiella pneumonia urinary tract infection. Treated briefly with Bactrim intravenously which was subsequently converted to oral. Initially the patient did improve then was noted to have decreased level of consciousness today and was hypotensive with systolic blood pressure in the 60-70s. Patient was found have a lactic acidosis as well as acute renal failure  Patient ate 25% of her dinner meal on 8/22. For breakfast, patient was ordered nectar thick OJ, Magic cups, scrambled eggs, oatmeal, [place] sausage, and strawberry yogurt.  SLP evaluated on 8/22: found mild aspiration risk, acute dysphagia RD ordered Mighty Shake II which provide very concentrated amounts of protein and calories in a nectar thick liquid.  [name] weight is stable.  Medications: IV Lasix BID Labs reviewed: CBGs: 121-134 Low K  Diet Order:  DIET DYS 2 Room service appropriate? Yes with Assist; Fluid consistency: Nectar Thick  Skin:  Wound (see comment) (Stage 1 sacral and L heel pressure injuries)  Last BM:  8/22  Height:   Ht Readings from Last 1 Encounters:  06/20/17 5\' 7"  (1.702 m)    Weight:   Wt Readings from Last 1  Encounters:  06/24/17 194 lb 15.2 oz (88.4 kg)    Ideal Body Weight:  61.4 kg  BMI:  Body mass index is 30.53 kg/m.  Estimated Nutritional Needs:   Kcal:  1800-2100kcal/day   Protein:  88-105g/day   Fluid:  >1.8L/day   EDUCATION NEEDS:   Education needs no appropriate at this time  Clayton Bibles, MS, RD, LDN Pager: [phone] After Hours Pager: [phone]

## 2017-06-24 NOTE — Evaluation (Signed)
Physical Therapy Evaluation Patient Details Name: Christy [name] MRN: [id number] DOB: 06-Nov-1940 Today's Date: 06/24/2017   History of Present Illness  76 y.o. female currently residing in a nursing home after deconditioning following recent L knee partial patellectomy and bil forearm fx's, Klebsiella pneumonia and  urinary tract infection. adm [facility] on 06/15/17 with decr LOC, hypotensive; transferred to SDU 8/17 d/t Resp distress and septic shock, intubated 8/19 and self ext 8/20  Clinical Impression  Pt admitted with above diagnosis. Pt currently with functional limitations due to the deficits listed below (see PT Problem List). * Pt will benefit from skilled PT to increase their independence and safety with mobility to allow discharge to the venue listed below.   Pt is profoundly weak and deconditioned, will need to return to  SNF if pt willing; no family present today, iPad interpreter utilized and when asked if pt needed anything she stated "only death", nsg staff made aware;  Pt may benefit from [place] to assist in determining goals of care; will continue to follow  In acute setting     Follow Up Recommendations SNF;Supervision/Assistance - 24 hour    Equipment Recommendations  None recommended by PT    Recommendations for Other Services       Precautions / Restrictions Precautions Precautions: Fall Restrictions Weight Bearing Restrictions: No Other Position/Activity Restrictions: previous notes WBAT LLE; per previous notes and pt she was wearing bledsoe brace LLE.       Mobility  Bed Mobility               General bed mobility comments: NT--pt only willing to perform AAROM extremities  Transfers                    Ambulation/Gait                Stairs            Wheelchair Mobility    Modified Rankin (Stroke Patients Only)       Balance                                             Pertinent  Vitals/Pain Pain Assessment: No/denies pain    Home Living Family/patient expects to be discharged to:: Skilled nursing facility                      Prior Function           Comments: Unsure, pt unable/unwilling to provide information and no family present.     Hand Dominance        Extremity/Trunk Assessment   Upper Extremity Assessment Upper Extremity Assessment:  (pt did not lift either arm on her own. Did not resist)    Lower Extremity Assessment Lower Extremity Assessment: LLE deficits/detail;Generalized weakness;RLE deficits/detail RLE Deficits / Details: AAROM grossly WFL; difficult to test strength, pt profondly weak and with limited participation LLE Deficits / Details: NT d/t no orders for ROM and recent patella surgery       Communication   Communication: Prefers language other than [place];Other (comment) (speaks [name]; ipad used interpreter [phone])  Cognition Arousal/Alertness: Awake/alert Behavior During Therapy: WFL for tasks assessed/performed Overall Cognitive Status: No family/caregiver present to determine baseline cognitive functioning  General Comments: appears WFL based on responses/interpreter      General Comments      Exercises     Assessment/Plan    PT Assessment Patient needs continued PT services  PT Problem List Decreased strength;Decreased range of motion;Decreased activity tolerance;Decreased mobility;Decreased knowledge of use of DME       PT Treatment Interventions Functional mobility training;Therapeutic activities;Therapeutic exercise;Balance training;Patient/family education    PT Goals (Current goals can be found in the Care Plan section)  Acute Rehab PT Goals Patient Stated Goal: none stated PT Goal Formulation: With patient Time For Goal Achievement: 07/08/17 Potential to Achieve Goals: Fair    Frequency Min 2X/week   Barriers to discharge         Co-evaluation PT/OT/SLP Co-Evaluation/Treatment: Yes Reason for Co-Treatment: Complexity of the patient's impairments (multi-system involvement)           AM-PAC PT "6 Clicks" Daily Activity  Outcome Measure Difficulty turning over in bed (including adjusting bedclothes, sheets and blankets)?: Unable Difficulty moving from lying on back to sitting on the side of the bed? : Unable Difficulty sitting down on and standing up from a chair with arms (e.g., wheelchair, bedside commode, etc,.)?: Unable Help needed moving to and from a bed to chair (including a wheelchair)?: Total Help needed walking in hospital room?: Total Help needed climbing 3-5 steps with a railing? : Total 6 Click Score: 6    End of Session   Activity Tolerance: Other (comment) (self limiting/limited participation) Patient left: in bed;with call bell/phone within reach;with bed alarm set Nurse Communication: Mobility status PT Visit Diagnosis: Muscle weakness (generalized) (M62.81);Adult, failure to thrive (R62.7)    Time: 9233-0076 PT Time Calculation (min) (ACUTE ONLY): 20 min   Charges:   PT Evaluation $PT Eval Moderate Complexity: 1 Mod     PT G CodesKenyon Ana, PT Pager: [phone] 06/24/2017'   Kenyon Ana 06/24/2017, 3:37 PM

## 2017-06-24 NOTE — Progress Notes (Signed)
PROGRESS NOTE  Christy [name]  BTD:[id number] DOB: [birth year] DOA: 06/15/2017 PCP: [name], MD  Brief Narrative:   76 year old female (speaks [name] language), from NH after knee surgery and deconditioning that followed in 04/2017, has history of ESBL Klebsiella pneumoniae UTI. She started to experience right lower quadrant abdominal pain. Her cx grew ESBL and she was treated with IV bactrim which was then changed to PO. On 8/14, she was found by NH staff with decreased consciousness, hypotensive with SBP in 60-70's. Lactic was was 7. She remained hypotensive with fluid challenge so she required pressor support and was admitted to the ICU on PCCM service.  Pt transitioned to [facility] care 8/17. Pt more rhonchorous on 8/17, transferred to SDU due to increased work of breathing. CCM consulted. CXR showed right upper lung lobe consolidation which is new since the admission.  Still feeling terrible   Significant event: 8/15 - on Levophed 8/16 - off pressor support 8/17 - transferred back to SDU 8/18 - BiPAP  Assessment & Plan:   Active Problems:   Elevated troponin   Septic shock (HCC)   Pressure injury of skin   Protein-calorie malnutrition, severe   Acute respiratory failure with hypoxia (HCC)   Urinary tract infection due to ESBL Klebsiella   Generalized weakness  Septic shock (HCC) / UTI ESBL - Septic shock on admission requiring pressor support from 8/14 - 8/16 - Urine culture - ESBL - Continue meropenem day 9 of 10 -  D/c vancomycin  Acute respiratory failure with hypoxia / HCAP and possible acute on chronic diastolic heart failure - Patient was intubated from August 19 through August 20, self extubated August 20 - Chest x-ray 06/22/2017 showed persistent bilateral pneumonia with probable small right pleural effusion and without definite pulmonary edema - Continue Lasix 40 mg IV BID -  Daily weights -  Strict I/O  Acute kidney injury / Hypokalemia - Due to  septic shock - Cr normalized   Hypokalemia - Due to diuresis - additional potassium today  Abdominal pain, resolved. - Pt has had CT scan on admission which showed possible inflammatory or infectious colitis and also dilated common bile duct mildly increased - Amylase, lipase negative  Mild troponin elevation 0.05 - Likely in the setting of septic shock, levels consistent with demand ischemia -Patient was seen by cardiology in consultation, ischemic workup not required at this time   Acute metabolic encephalopathy - Good mental status this am   Protein-calorie malnutrition, severe - In the context of acute on chronic illness - Seen by nutritionist   Stage 1 sacral ulcer and stage 2 lateral left heel - Seen by WOC - Sacral ulcer stage 1, MASD left medial buttock - Stage 2 lateral left heel - WOC recommended bilateral prevalon boots, ointment to MASD, foam dressing to sacrum for prevention; low air loss mattress  Generalized weakness -  PT/OT recommending SNF  Patient told OT today that she wants to die.  I think she seems depressed.   -  Palliative care consultation to discuss Kemp.   -  Start mirtazapine for depression and for appetite  DVT prophylaxis: SCD's Code Status: full code  Family Communication: daughter at bedside Disposition Plan:  To SNF soon.      Consultants:   PCCM to Hospitalist transfer  Procedures:  none  Antimicrobials:  Anti-infectives    Start     Dose/Rate Route Frequency Ordered Stop   06/22/17 0800  vancomycin (VANCOCIN) IVPB 750 mg/150 ml premix  Status:  Discontinued     750 mg 150 mL/hr over 60 Minutes Intravenous Every 24 hours 06/22/17 0645 06/24/17 1156   06/18/17 1200  vancomycin (VANCOCIN) IVPB 1000 mg/200 mL premix  Status:  Discontinued     1,000 mg 200 mL/hr over 60 Minutes Intravenous Every 12 hours 06/18/17 1013 06/21/17 1307   06/17/17 1600  meropenem (MERREM) 1 g in sodium chloride 0.9 % 100 mL IVPB      1 g 200 mL/hr over 30 Minutes Intravenous Every 8 hours 06/17/17 1149     06/16/17 2200  meropenem (MERREM) 1 g in sodium chloride 0.9 % 100 mL IVPB  Status:  Discontinued     1 g 200 mL/hr over 30 Minutes Intravenous Every 12 hours 06/16/17 1034 06/17/17 1149   06/16/17 2000  vancomycin (VANCOCIN) IVPB 1000 mg/200 mL premix  Status:  Discontinued     1,000 mg 200 mL/hr over 60 Minutes Intravenous Every 24 hours 06/15/17 1939 06/16/17 1034   06/16/17 1800  vancomycin (VANCOCIN) 1,250 mg in sodium chloride 0.9 % 250 mL IVPB  Status:  Discontinued     1,250 mg 166.7 mL/hr over 90 Minutes Intravenous Every 24 hours 06/16/17 1034 06/17/17 1058   06/15/17 2200  meropenem (MERREM) 1 g in sodium chloride 0.9 % 100 mL IVPB  Status:  Discontinued     1 g 200 mL/hr over 30 Minutes Intravenous Every 12 hours 06/15/17 1939 06/15/17 2025   06/15/17 2200  meropenem (MERREM) 500 mg in sodium chloride 0.9 % 50 mL IVPB  Status:  Discontinued     500 mg 100 mL/hr over 30 Minutes Intravenous Every 12 hours 06/15/17 2025 06/16/17 1034   06/15/17 1815  vancomycin (VANCOCIN) IVPB 1000 mg/200 mL premix     1,000 mg 200 mL/hr over 60 Minutes Intravenous  Once 06/15/17 1809 06/15/17 2000   06/15/17 1245  ertapenem (INVANZ) 1 g in sodium chloride 0.9 % 50 mL IVPB  Status:  Discontinued     1 g 100 mL/hr over 30 Minutes Intravenous Daily 06/15/17 1221 06/15/17 1636       Subjective:  Via [name] interpreter. Still coughing and feeling very tired/fatigued.  Denies chest pains.  Has intermittent nausea but this is not what prevents her from eating.    Objective: Vitals:   06/23/17 1953 06/23/17 2122 06/24/17 0435 06/24/17 0909  BP:  130/72 131/63   Pulse:  89 90 90  Resp:  _0 Temp:  98.9 F (37.2 C) 97.7 F (36.5 C)   TempSrc:  Oral Oral   SpO2: 97% 96% 90% 91%  Weight:   88.4 kg (194 lb 15.2 oz)   Height:        Intake/Output Summary (Last 24 hours) at 06/24/17 1545 Last data filed at  06/24/17 0436  Gross per 24 hour  Intake             4591 ml  Output                5 ml  Net             4586 ml   Filed Weights   06/22/17 0500 06/23/17 0252 06/24/17 0435  Weight: 88.4 kg (194 lb 14.2 oz) 88.5 kg (195 lb 1.7 oz) 88.4 kg (194 lb 15.2 oz)    Examination:  General exam:  Adult female, head hanging to side and too weak to keep upright.  No acute distress.  HEENT:  NCAT, MMM  Respiratory system: Rhonchorous bilateral breath sounds, no wheezes or focal rales Cardiovascular system: Regular rate and rhythm, normal S1/S2. No murmurs, rubs, gallops or clicks.  Warm extremities Gastrointestinal system: Normal active bowel sounds, soft, nondistended, nontender. MSK:  Normal tone and bulk, 1+ pitting bilateral upper extremity edema and dependent lower extremity edema Neuro:  Unable to hold up head.  Upper extremities 4/5 bilaterally, face symmetric.  Lower extremities 3/5 bilaterally.  Sensation intact to light touch.      Data Reviewed: I have personally reviewed following labs and imaging studies  CBC:  Recent Labs Lab 06/20/17 0632 06/21/17 0424 06/22/17 0500 06/23/17 0321 06/24/17 0500  WBC 8.8 9.2 9.2 7.8 7.2  HGB 9.0* 8.3* 8.1* 8.2* 8.5*  HCT 27.3* 25.5* 25.1* 25.3* 26.7*  MCV 90.1 89.2 90.9 90.0 89.6  PLT 243 260 304 321 412   Basic Metabolic Panel:  Recent Labs Lab 06/18/17 0330  06/20/17 2201 06/21/17 0424 06/22/17 0500 06/23/17 0321 06/24/17 0500  NA 141  < > 148* 146* 146* 140 141  K 3.3*  < > 2.8* 3.5 3.6 3.0* 3.4*  CL 115*  < > 114* 114* 112* 104 100*  CO2 21*  < > _0 32  GLUCOSE 107*  < > 112* 158* 148* 119* 117*  BUN 28*  < > 22* 21* _1 CREATININE 0.72  < > 0.72 0.73 0.61 0.42* 0.62  CALCIUM 7.9*  < > 7.8* 7.7* 7.8* 7.4* 7.6*  MG 1.7  --   --  1.4* 1.8  --   --   < > = values in this interval not displayed. GFR: Estimated Creatinine Clearance: 68.3 mL/min (by C-G formula based on SCr of 0.62 mg/dL). Liver Function  Tests:  Recent Labs Lab 06/19/17 1116  AST 19  ALT 18  ALKPHOS 181*  BILITOT 0.7  PROT 5.2*  ALBUMIN 1.9*   No results for input(s): LIPASE, AMYLASE in the last 168 hours. No results for input(s): AMMONIA in the last 168 hours. Coagulation Profile: No results for input(s): INR, PROTIME in the last 168 hours. Cardiac Enzymes:  Recent Labs Lab 06/18/17 1014 06/18/17 1530 06/18/17 2345  TROPONINI 0.05* 0.04* 0.03*   BNP (last 3 results) No results for input(s): PROBNP in the last 8760 hours. HbA1C: No results for input(s): HGBA1C in the last 72 hours. CBG:  Recent Labs Lab 06/23/17 2043 06/24/17 0001 06/24/17 0429 06/24/17 0732 06/24/17 1219  GLUCAP 154* 107* 114* 121* 134*   Lipid Profile: No results for input(s): CHOL, HDL, LDLCALC, TRIG, CHOLHDL, LDLDIRECT in the last 72 hours. Thyroid Function Tests: No results for input(s): TSH, T4TOTAL, FREET4, T3FREE, THYROIDAB in the last 72 hours. Anemia Panel: No results for input(s): VITAMINB12, FOLATE, FERRITIN, TIBC, IRON, RETICCTPCT in the last 72 hours. Urine analysis:    Component Value Date/Time   COLORURINE RED (A) 06/15/2017 1320   APPEARANCEUR CLOUDY (A) 06/15/2017 1320   LABSPEC 1.013 06/15/2017 1320   PHURINE 7.0 06/15/2017 1320   GLUCOSEU NEGATIVE 06/15/2017 1320   HGBUR SMALL (A) 06/15/2017 1320   BILIRUBINUR NEGATIVE 06/15/2017 1320   KETONESUR NEGATIVE 06/15/2017 1320   PROTEINUR NEGATIVE 06/15/2017 1320   UROBILINOGEN 0.2 11/27/2013 2012   NITRITE NEGATIVE 06/15/2017 1320   LEUKOCYTESUR TRACE (A) 06/15/2017 1320   Sepsis Labs: _2 (procalcitonin:4,lacticidven:4)  ) Recent Results (from the past 240 hour(s))  Urine culture     Status: Abnormal   Collection Time: 06/15/17 12:18 PM  Result Value Ref Range  Status   Specimen Description URINE, CATHETERIZED  Final   Special Requests NONE  Final   Culture (A)  Final    >=100,000 COLONIES/mL KLEBSIELLA PNEUMONIAE Confirmed Extended  Spectrum Beta-Lactamase Producer (ESBL) >=100,000 COLONIES/mL ENTEROCOCCUS FAECALIS    Report Status 06/18/2017 FINAL  Final   Organism ID, Bacteria KLEBSIELLA PNEUMONIAE (A)  Final   Organism ID, Bacteria ENTEROCOCCUS FAECALIS (A)  Final      Susceptibility   Enterococcus faecalis - MIC*    AMPICILLIN <=2 SENSITIVE Sensitive     LEVOFLOXACIN >=8 RESISTANT Resistant     NITROFURANTOIN <=16 SENSITIVE Sensitive     VANCOMYCIN 1 SENSITIVE Sensitive     * >=100,000 COLONIES/mL ENTEROCOCCUS FAECALIS   Klebsiella pneumoniae - MIC*    AMPICILLIN >=32 RESISTANT Resistant     CEFAZOLIN >=64 RESISTANT Resistant     CEFTRIAXONE >=64 RESISTANT Resistant     CIPROFLOXACIN >=4 RESISTANT Resistant     GENTAMICIN >=16 RESISTANT Resistant     IMIPENEM <=0.25 SENSITIVE Sensitive     NITROFURANTOIN 64 INTERMEDIATE Intermediate     TRIMETH/SULFA >=320 RESISTANT Resistant     AMPICILLIN/SULBACTAM >=32 RESISTANT Resistant     PIP/TAZO >=128 RESISTANT Resistant     Extended ESBL POSITIVE Resistant     * >=100,000 COLONIES/mL KLEBSIELLA PNEUMONIAE  Blood Culture (routine x 2)     Status: None   Collection Time: 06/15/17 12:22 PM  Result Value Ref Range Status   Specimen Description BLOOD LEFT ANTECUBITAL  Final   Special Requests   Final    BOTTLES DRAWN AEROBIC AND ANAEROBIC Blood Culture adequate volume   Culture   Final    NO GROWTH 5 DAYS Performed at [facility], [address] [place], [place] [address]    Report Status 06/20/2017 FINAL  Final  Blood Culture (routine x 2)     Status: None   Collection Time: 06/15/17 12:46 PM  Result Value Ref Range Status   Specimen Description BLOOD RIGHT ANTECUBITAL  Final   Special Requests   Final    BOTTLES DRAWN AEROBIC AND ANAEROBIC Blood Culture adequate volume   Culture   Final    NO GROWTH 5 DAYS Performed at [facility], [place] [address], [place], [place] [address]    Report Status 06/20/2017 FINAL  Final  MRSA PCR Screening      Status: Abnormal   Collection Time: 06/15/17  6:30 PM  Result Value Ref Range Status   MRSA by PCR POSITIVE (A) NEGATIVE Final    Comment:        The GeneXpert MRSA Assay (FDA approved for NASAL specimens only), is one component of a comprehensive MRSA colonization surveillance program. It is not intended to diagnose MRSA infection nor to guide or monitor treatment for MRSA infections. RESULT CALLED TO, READ BACK BY AND VERIFIED WITH: Audree Camel [id number] @ [facility]       Radiology Studies: No results found.   Scheduled Meds: . aspirin EC  81 mg Oral Daily  . chlorhexidine gluconate (MEDLINE KIT)  15 mL Mouth Rinse BID  . Chlorhexidine Gluconate Cloth  6 each Topical Daily  . furosemide  40 mg Intravenous BID  . insulin aspart  0-15 Units Subcutaneous Q4H  . ipratropium-albuterol  3 mL Nebulization TID  . mouth rinse  15 mL Mouth Rinse QID  . sodium chloride flush  10-40 mL Intracatheter Q12H   Continuous Infusions: . sodium chloride    . meropenem (  MERREM) IV Stopped (06/24/17 1218)     LOS: 9 days    Time spent: 30 min    Janece Canterbury, MD [facility] Pager [phone]  If 7PM-7AM, please contact night-coverage [email or web address] Password [facility] 06/24/2017, 3:45 PM

## 2017-06-24 NOTE — Progress Notes (Signed)
  Speech Language Pathology Treatment: Dysphagia  Patient Details Name: Christy [name] MRN: [id number] DOB: 29-Jan-1941 Today's Date: 06/24/2017 Time: [id number] SLP Time Calculation (min) (ACUTE ONLY): 25 min  Assessment / Plan / Recommendation Clinical Impression  Pt seen to assess po tolerance and readiness for dietary advancement.  Used ipad for auditory interpreting placed close to pt's ear due to her hearing loss.  Pt reported "hurt all over" but denied "pain", requested pain medicine.  RN arrived and with use of interpreter pt accepted crushed medicine with pudding.  No indication of airway compromise with intake of puree.  Pt also given nectar thickened apple juice via straw - delayed cough x1 noted.  In addition, voice remains dysphonic - with pt only speaking 1-2 words with max abdomen effort - air depletion rapid due to gross weakness.  Spoke to OT, PT who report pt expressed desire "to die". Will follow up.     HPI HPI: 76 yo female adm to [facility] with AMS from SNF on 06/15/17.  Pt developed respiratory deficits and was intubated on 8/19- self extubated 06/21/17.     Pt with PMH + for former smoker, breast malignancy, knee surgery with post op deconditioning, UTI.  Swallow evaluation ordered.  Pt speaks [name].        SLP Plan  Continue with current plan of care       Recommendations  Diet recommendations: Dysphagia 2 (fine chop);Nectar-thick liquid (small single chips) Liquids provided via: Straw Medication Administration: Whole meds with puree Supervision: Full supervision/cueing for compensatory strategies Compensations: Slow rate;Small sips/bites;Multiple dry swallows after each bite/sip Postural Changes and/or Swallow Maneuvers: Seated upright 90 degrees;Upright 30-60 min after meal                Oral Care Recommendations: Oral care BID Follow up Recommendations:  (snf) Plan: Continue with current plan of care       GO               Zoriana Oats, [facility]  SLP [phone]  Braydee, [place] 06/24/2017, 5:14 PM

## 2017-06-24 NOTE — Evaluation (Signed)
Occupational Therapy Evaluation Patient Details Name: Christy [name] MRN: [id number] DOB: June 06, 1941 Today's Date: 06/24/2017    History of Present Illness 76 y.o. female currently residing in a nursing home after deconditioning following recent L knee partial patellectomy and bil forearm fx's, Klebsiella pneumonia and  urinary tract infection. adm [facility] on 06/15/17 with decr LOC, hypotensive; transferred to SDU 8/17 d/t Resp distress and septic shock, intubated 8/19 and self ext 8/20   Clinical Impression   Pt was admitted for the above. She was at [facility] prior to admission.  Used interpreter IPAD ([name] language).  Pt expressed that she wanted to die and she didn't feel she could try during ROM.  Pt needs total A with all adls at this time.  Will trial OT in acute to see if pt is able to participate    Follow Up Recommendations  SNF    Equipment Recommendations  None recommended by OT    Recommendations for Other Services       Precautions / Restrictions Precautions Precautions: Fall Restrictions Weight Bearing Restrictions: No Other Position/Activity Restrictions: previous notes WBAT LLE; per previous notes and pt she was wearing bledsoe brace LLE.       Mobility Bed Mobility               General bed mobility comments: NT--pt only willing to perform AAROM extremities  Transfers                      Balance                                           ADL either performed or assessed with clinical judgement   ADL Overall ADL's : Needs assistance/impaired                                       General ADL Comments: pt needs total A for all adls; +2 for LB adls for rolling     Vision         Perception     Praxis      Pertinent Vitals/Pain Pain Assessment: No/denies pain     Hand Dominance     Extremity/Trunk Assessment Upper Extremity Assessment Upper Extremity Assessment:  (pt did not lift either arm on her  own. Did not resist).  [name] RUE more mobile than L. L has IJ line          Communication Communication Communication: Prefers language other than [place];Other (comment) (speaks [name]; ipad used interpreter [phone])   Cognition Arousal/Alertness: Awake/alert Behavior During Therapy: WFL for tasks assessed/performed Overall Cognitive Status: No family/caregiver present to determine baseline cognitive functioning                                 General Comments: appears WFL based on responses/interpreter   General Comments   Used interpreter line. Pt expressed that she wanted to die.  Texted Dr Sheran Fava.  Also it appears that pt was due to follow up with Dr Erlinda Hong. If pt is able to tolerate, will use KI with WBAT. Bledsloe brace is not here  (Unless orders are upgraded during admission)    Exercises     Shoulder Instructions  Home Living Family/patient expects to be discharged to:: Skilled nursing facility                                        Prior Functioning/Environment          Comments: Unsure, pt unable/unwilling to provide information and no family present.        OT Problem List: Decreased strength;Decreased activity tolerance;Impaired balance (sitting and/or standing);Decreased cognition;Decreased knowledge of use of DME or AE;Pain;Impaired UE functional use      OT Treatment/Interventions: Self-care/ADL training;Therapeutic exercise;Patient/family education    OT Goals(Current goals can be found in the care plan section) Acute Rehab OT Goals Patient Stated Goal: none stated ADL Goals Pt Will Perform Grooming: with max assist;sitting;bed level Additional ADL Goal #1: pt will perform 10 reps of AAROM to bil UEs to increase strength for adls Additional ADL Goal #2: pt will roll with max +2 assist for adls  OT Frequency: Min 1X/week   Barriers to D/C:            Co-evaluation   Reason for Co-Treatment: Complexity of the  patient's impairments (multi-system involvement)          AM-PAC PT "6 Clicks" Daily Activity     Outcome Measure Help from another person eating meals?: Total Help from another person taking care of personal grooming?: Total Help from another person toileting, which includes using toliet, bedpan, or urinal?: Total Help from another person bathing (including washing, rinsing, drying)?: Total Help from another person to put on and taking off regular upper body clothing?: Total Help from another person to put on and taking off regular lower body clothing?: Total 6 Click Score: 6   End of Session    Activity Tolerance: Patient limited by fatigue;Patient limited by lethargy Patient left: in bed;with call bell/phone within reach  OT Visit Diagnosis: Muscle weakness (generalized) (M62.81)                Time:  - 1412-1430  18 minutes Charges:   1 Low Eval G-Codes:     [name], OTR/L [phone] 06/24/2017  [name] 06/24/2017, 3:38 PM

## 2017-06-24 NOTE — Progress Notes (Signed)
Pharmacy Antibiotic Note  Christy [name] is a 76 y.o. female admitted on 06/15/2017 with AMS and UTI. She was recently treated for ESBL UTI infection & on Bactrim per nursing home [facility]. Admitted with Klebsiella UTI and septic shock, failure to respond to outpatient Bactrim She improved, came off pressors and was transferred to the floor 8/16. Developed acute respiratory distress 8/17 AM . Chest x-ray shows right upper lobe consolidation which is new since admission.  Vancomycin added back 8/17 to cover enterococcus.   Plan:  Day 9 Meropenem 1 gm IV q8h, planning 10 days total   Height: 5\' 7"  (170.2 cm) Weight: 194 lb 15.2 oz (88.4 kg) IBW/kg (Calculated) : 61.6  Temp (24hrs), Avg:98.6 F (37 C), Min:97.7 F (36.5 C), Max:99.1 F (37.3 C)   Recent Labs Lab 06/20/17 0632 06/20/17 2201 06/21/17 0424 06/21/17 1215 06/22/17 0500 06/23/17 0321 06/24/17 0500  WBC 8.8  --  9.2  --  9.2 7.8 7.2  CREATININE 0.63 0.72 0.73  --  0.61 0.42* 0.62  VANCOTROUGH  --   --   --  60*  --   --   --   VANCORANDOM  --   --   --   --  21  --   --     Estimated Creatinine Clearance: 68.3 mL/min (by C-G formula based on SCr of 0.62 mg/dL).    No Known Allergies Antimicrobials this admission: 8/14 Invanz x1 8/14 Vanc >> 8/16  8/17>> 8/23 8/14 Meropenem >>    Dose adjustments this admission: 8/15 increase meropenem from 500mg  to 1g q12h, vanc 1g to 1250mg  q24h for improved SCr 8/16 increase from 1g q12h to 1g q8h for improved SCr 8/20 at 1215: VT is 38 mcg/ml. Hold vanc 8/21 Vanc resumed at 750mg  q24 (discontinued 8/23)  Microbiology results: 8/14 BCx: NGF 8/14 UCx: >100k Klebsiella pneumoniae (S imip, I nitro, R all others)                  >100k Enterococcus faecalis (S amp, nitro, vanc; R levo) 8/14 MRSA PCR: positive  6/26 UCx: ESBL Klebsiella -only sens imipenem 6/3 UCx: Enterococcus faecalis: S=Amp, Nirtrofurantoin, Vanc, I=Levofloxacin  Thank you for allowing pharmacy to be a part  of this patient's care.  Minda Ditto PharmD Pager [phone] 06/24/2017, 2:22 PM

## 2017-06-24 NOTE — Progress Notes (Signed)
Acknowledged CSW consult for SNF placement. Pt assessed previously, from [facility] and plans to return at DC. Will continue following for needs.   Sharren Bridge, MSW, LCSW Clinical Social Work 06/24/2017 [phone]

## 2017-06-24 NOTE — Care Management Important Message (Signed)
Important Message  Patient Details IM Letter given to Nora/Case Manager to present to Patient Name: Stela Iwasaki MRN: [id number] Date of Birth: 18-Dec-1940   Medicare Important Message Given:  Yes    Kerin Salen 06/24/2017, 11:08 AMImportant Message  Patient Details  Name: [place] Nolton MRN: [id number] Date of Birth: 1941-10-19   Medicare Important Message Given:  Yes    Kerin Salen 06/24/2017, 11:08 AM

## 2017-06-25 ENCOUNTER — Inpatient Hospital Stay (HOSPITAL_COMMUNITY): Payer: Medicare Other | Admitting: Anesthesiology

## 2017-06-25 ENCOUNTER — Inpatient Hospital Stay (HOSPITAL_COMMUNITY): Payer: Medicare Other

## 2017-06-25 DIAGNOSIS — I469 Cardiac arrest, cause unspecified: Secondary | ICD-10-CM

## 2017-06-25 DIAGNOSIS — B9689 Other specified bacterial agents as the cause of diseases classified elsewhere: Secondary | ICD-10-CM

## 2017-06-25 DIAGNOSIS — N390 Urinary tract infection, site not specified: Secondary | ICD-10-CM

## 2017-06-25 LAB — CBC
HCT: 25.1 % — ABNORMAL LOW (ref 36.0–46.0)
HEMATOCRIT: 25.4 % — AB (ref 36.0–46.0)
HEMOGLOBIN: 8.1 g/dL — AB (ref 12.0–15.0)
HEMOGLOBIN: 8.1 g/dL — AB (ref 12.0–15.0)
MCH: 28.9 pg (ref 26.0–34.0)
MCH: 29.7 pg (ref 26.0–34.0)
MCHC: 31.9 g/dL (ref 30.0–36.0)
MCHC: 32.3 g/dL (ref 30.0–36.0)
MCV: 90.7 fL (ref 78.0–100.0)
MCV: 91.9 fL (ref 78.0–100.0)
PLATELETS: 363 10*3/uL (ref 150–400)
Platelets: 396 10*3/uL (ref 150–400)
RBC: 2.8 MIL/uL — AB (ref 3.87–5.11)
RBC: 2.73 MIL/uL — AB (ref 3.87–5.11)
RDW: 15.5 % (ref 11.5–15.5)
RDW: 15.8 % — ABNORMAL HIGH (ref 11.5–15.5)
WBC: 5.5 10*3/uL (ref 4.0–10.5)
WBC: 10.5 10*3/uL (ref 4.0–10.5)

## 2017-06-25 LAB — BLOOD GAS, ARTERIAL
ACID-BASE EXCESS: 5.1 mmol/L — AB (ref 0.0–2.0)
Bicarbonate: 29.3 mmol/L — ABNORMAL HIGH (ref 20.0–28.0)
DRAWN BY: 295031
FIO2: 100
MECHVT: 490 mL
O2 SAT: 98.8 %
PATIENT TEMPERATURE: 98.6
PCO2 ART: 43.7 mmHg (ref 32.0–48.0)
PEEP/CPAP: 5 cmH2O
PH ART: 7.441 (ref 7.350–7.450)
PO2 ART: 155 mmHg — AB (ref 83.0–108.0)
RATE: 22 resp/min

## 2017-06-25 LAB — GLUCOSE, CAPILLARY
GLUCOSE-CAPILLARY: 274 mg/dL — AB (ref 65–99)
GLUCOSE-CAPILLARY: 252 mg/dL — AB (ref 65–99)
Glucose-Capillary: 194 mg/dL — ABNORMAL HIGH (ref 65–99)
Glucose-Capillary: 238 mg/dL — ABNORMAL HIGH (ref 65–99)
Glucose-Capillary: 86 mg/dL (ref 65–99)
Glucose-Capillary: 89 mg/dL (ref 65–99)

## 2017-06-25 LAB — COMPREHENSIVE METABOLIC PANEL
ALK PHOS: 103 U/L (ref 38–126)
ALT: 25 U/L (ref 14–54)
ANION GAP: 9 (ref 5–15)
AST: 63 U/L — ABNORMAL HIGH (ref 15–41)
Albumin: 1.6 g/dL — ABNORMAL LOW (ref 3.5–5.0)
BUN: 14 mg/dL (ref 6–20)
CALCIUM: 6.8 mg/dL — AB (ref 8.9–10.3)
CO2: 28 mmol/L (ref 22–32)
Chloride: 103 mmol/L (ref 101–111)
Creatinine, Ser: 0.67 mg/dL (ref 0.44–1.00)
GFR calc non Af Amer: >60 mL/min (ref >60)
Glucose, Bld: 245 mg/dL — ABNORMAL HIGH (ref 65–99)
Potassium: 3.8 mmol/L (ref 3.5–5.1)
SODIUM: 140 mmol/L (ref 135–145)
TOTAL PROTEIN: 4.7 g/dL — AB (ref 6.5–8.1)
Total Bilirubin: 0.5 mg/dL (ref 0.3–1.2)

## 2017-06-25 LAB — BASIC METABOLIC PANEL
Anion gap: 7 (ref 5–15)
BUN: 13 mg/dL (ref 6–20)
CHLORIDE: 99 mmol/L — AB (ref 101–111)
CO2: 34 mmol/L — AB (ref 22–32)
Calcium: 7.5 mg/dL — ABNORMAL LOW (ref 8.9–10.3)
Creatinine, Ser: 0.53 mg/dL (ref 0.44–1.00)
GFR calc non Af Amer: >60 mL/min (ref >60)
Glucose, Bld: 91 mg/dL (ref 65–99)
POTASSIUM: 3.1 mmol/L — AB (ref 3.5–5.1)
SODIUM: 140 mmol/L (ref 135–145)

## 2017-06-25 LAB — URINALYSIS, ROUTINE W REFLEX MICROSCOPIC
BACTERIA UA: NONE SEEN
GLUCOSE, UA: 100 mg/dL — AB
KETONES UR: NEGATIVE mg/dL
Leukocytes, UA: NEGATIVE
Nitrite: NEGATIVE
PROTEIN: 100 mg/dL — AB
Specific Gravity, Urine: 1.03 (ref 1.005–1.030)
pH: 5.5 (ref 5.0–8.0)

## 2017-06-25 LAB — MAGNESIUM: MAGNESIUM: 1.4 mg/dL — AB (ref 1.7–2.4)

## 2017-06-25 MED ORDER — CITALOPRAM HYDROBROMIDE 20 MG PO TABS
20.0000 mg | ORAL_TABLET | Freq: Every day | ORAL | Status: DC
  Administered 2017-06-26: 20 mg
  Filled 2017-06-25 (×2): qty 1

## 2017-06-25 MED ORDER — PROPOFOL 10 MG/ML IV BOLUS
INTRAVENOUS | Status: DC | PRN
  Administered 2017-06-25: 100 mg via INTRAVENOUS

## 2017-06-25 MED ORDER — LACTATED RINGERS IV SOLN
INTRAVENOUS | Status: DC
  Administered 2017-06-25 – 2017-06-30 (×4): via INTRAVENOUS

## 2017-06-25 MED ORDER — PROPOFOL 10 MG/ML IV BOLUS
INTRAVENOUS | Status: AC
  Filled 2017-06-25: qty 20

## 2017-06-25 MED ORDER — PANTOPRAZOLE SODIUM 40 MG PO PACK
40.0000 mg | PACK | ORAL | Status: DC
  Administered 2017-06-25 – 2017-06-26 (×2): 40 mg
  Filled 2017-06-25 (×2): qty 20

## 2017-06-25 MED ORDER — SODIUM CHLORIDE 0.9% FLUSH: 10.0000 mL | INTRAVENOUS | Status: DC | PRN

## 2017-06-25 MED ORDER — ALTEPLASE 2 MG IJ SOLR
2.0000 mg | Freq: Once | INTRAMUSCULAR | Status: AC
  Administered 2017-06-25: 2 mg
  Filled 2017-06-25: qty 2

## 2017-06-25 MED ORDER — PHENYLEPHRINE HCL-NACL 10-0.9 MG/250ML-% IV SOLN
0.0000 ug/min | INTRAVENOUS | Status: DC
  Administered 2017-06-25: 20 ug/min via INTRAVENOUS
  Filled 2017-06-25: qty 250

## 2017-06-25 MED ORDER — FUROSEMIDE 40 MG PO TABS
40.0000 mg | ORAL_TABLET | Freq: Every day | ORAL | Status: DC
Start: 2017-06-25 — End: 2017-06-25

## 2017-06-25 MED ORDER — FENTANYL CITRATE (PF) 100 MCG/2ML IJ SOLN
50.0000 ug | INTRAMUSCULAR | Status: DC | PRN
  Administered 2017-06-26 (×3): 50 ug via INTRAVENOUS
  Filled 2017-06-25 (×3): qty 2

## 2017-06-25 MED ORDER — IPRATROPIUM-ALBUTEROL 0.5-2.5 (3) MG/3ML IN SOLN
3.0000 mL | Freq: Four times a day (QID) | RESPIRATORY_TRACT | Status: DC
  Administered 2017-06-25 – 2017-06-26 (×4): 3 mL via RESPIRATORY_TRACT
  Filled 2017-06-25 (×4): qty 3

## 2017-06-25 MED ORDER — SODIUM CHLORIDE 0.9 % IV BOLUS (SEPSIS)
1000.0000 mL | Freq: Once | INTRAVENOUS | Status: AC
  Administered 2017-06-25: 1000 mL via INTRAVENOUS

## 2017-06-25 MED ORDER — IOPAMIDOL (ISOVUE-300) INJECTION 61%: 15.0000 mL | Freq: Once | INTRAVENOUS | Status: DC | PRN

## 2017-06-25 MED ORDER — NOREPINEPHRINE BITARTRATE 1 MG/ML IV SOLN
0.0000 ug/min | INTRAVENOUS | Status: DC
  Administered 2017-06-25: 40 ug/min via INTRAVENOUS
  Administered 2017-06-25: 10 ug/min via INTRAVENOUS
  Filled 2017-06-25 (×2): qty 4

## 2017-06-25 MED ORDER — ORAL CARE MOUTH RINSE
15.0000 mL | Freq: Four times a day (QID) | OROMUCOSAL | Status: DC
  Administered 2017-06-25 – 2017-07-02 (×22): 15 mL via OROMUCOSAL

## 2017-06-25 MED ORDER — IOPAMIDOL (ISOVUE-300) INJECTION 61%
INTRAVENOUS | Status: AC
  Filled 2017-06-25: qty 30

## 2017-06-25 MED ORDER — MAGNESIUM SULFATE 2 GM/50ML IV SOLN
2.0000 g | Freq: Once | INTRAVENOUS | Status: AC
  Administered 2017-06-25: 2 g via INTRAVENOUS
  Filled 2017-06-25: qty 50

## 2017-06-25 MED ORDER — HEPARIN SODIUM (PORCINE) 5000 UNIT/ML IJ SOLN
5000.0000 [IU] | Freq: Three times a day (TID) | INTRAMUSCULAR | Status: DC
  Administered 2017-06-25 – 2017-07-07 (×36): 5000 [IU] via SUBCUTANEOUS
  Filled 2017-06-25 (×35): qty 1

## 2017-06-25 MED ORDER — SUCCINYLCHOLINE CHLORIDE 20 MG/ML IJ SOLN
INTRAMUSCULAR | Status: DC | PRN
  Administered 2017-06-25: 120 mg via INTRAVENOUS

## 2017-06-25 MED ORDER — DOPAMINE-DEXTROSE 3.2-5 MG/ML-% IV SOLN: 0.0000 ug/kg/min | INTRAVENOUS | Status: DC

## 2017-06-25 MED ORDER — DEXTROSE 5 % IV SOLN
0.0000 ug/min | INTRAVENOUS | Status: DC
  Administered 2017-06-25: 40 ug/min via INTRAVENOUS
  Administered 2017-06-26: 38 ug/min via INTRAVENOUS
  Administered 2017-06-27: 16 ug/min via INTRAVENOUS
  Administered 2017-06-27: 14 ug/min via INTRAVENOUS
  Administered 2017-06-29: 10 ug/min via INTRAVENOUS
  Administered 2017-06-30: 6 ug/min via INTRAVENOUS
  Filled 2017-06-25 (×7): qty 16

## 2017-06-25 MED ORDER — CHLORHEXIDINE GLUCONATE 0.12% ORAL RINSE (MEDLINE KIT)
15.0000 mL | Freq: Two times a day (BID) | OROMUCOSAL | Status: DC
  Administered 2017-06-25 – 2017-07-02 (×9): 15 mL via OROMUCOSAL

## 2017-06-25 MED ORDER — CITALOPRAM HYDROBROMIDE 20 MG PO TABS: 20.0000 mg | ORAL_TABLET | Freq: Every day | ORAL | Status: DC

## 2017-06-25 MED ORDER — MIDAZOLAM HCL 2 MG/2ML IJ SOLN
1.0000 mg | INTRAMUSCULAR | Status: DC | PRN
  Administered 2017-06-25 – 2017-06-26 (×3): 1 mg via INTRAVENOUS
  Filled 2017-06-25 (×4): qty 2

## 2017-06-25 MED ORDER — SUCCINYLCHOLINE CHLORIDE 200 MG/10ML IV SOSY
PREFILLED_SYRINGE | INTRAVENOUS | Status: AC
  Filled 2017-06-25: qty 10

## 2017-06-25 MED ORDER — ACETAMINOPHEN 160 MG/5ML PO SOLN
650.0000 mg | Freq: Four times a day (QID) | ORAL | Status: DC | PRN
  Administered 2017-06-25 – 2017-06-26 (×3): 650 mg
  Filled 2017-06-25 (×3): qty 20.3

## 2017-06-25 MED ORDER — POTASSIUM CHLORIDE CRYS ER 20 MEQ PO TBCR
40.0000 meq | EXTENDED_RELEASE_TABLET | Freq: Once | ORAL | Status: AC
  Administered 2017-06-25: 40 meq via ORAL
  Filled 2017-06-25: qty 2

## 2017-06-25 MED FILL — Medication: Qty: 1 | Status: AC

## 2017-06-25 NOTE — Consult Note (Signed)
PULMONARY / CRITICAL CARE MEDICINE   Name: Christy [name] MRN: [id number] DOB: 11-05-1940    ADMISSION DATE:  06/15/2017  REFERRING MD:  Dr. Sheran Fava, [facility]  CHIEF COMPLAINT:  Bradycardia  HISTORY OF PRESENT ILLNESS:   76 yo female had knee surgery and was at SNF.  She was found to have weakness and RLQ discomfort.  She then had altered mental status, and was hypotensive.  She was tx for septic shock and UTI.  She developed respiratory failure from HCAP with concern for aspiration and required intubation.  She extubated herself, and was transferred to medical ward.  On 06/25/17 she was noted to have pulled her Rt IJ central line out.  She then developed bradycardia, hypotension, altered mental status and respiratory failure.  She required intubation by anesthesia.  After being transferred to ICU she developed asystole.  She required CPR.  She eventually regained pulse and blood pressure.  She also was able to follow simple commands.  PCCM asked to resume primary care while in ICU.  PAST MEDICAL HISTORY :  She  has a past medical history of Abnormal ECG; Cancer ([place]); Cataract; Diabetes mellitus; Hypercholesterolemia; Hyperlipidemia; Hypertension; Osteoporosis; and Overactive bladder.  PAST SURGICAL HISTORY: She  has a past surgical history that includes Breast surgery; Portacath placement; Joint replacement; Fracture surgery; Port-a-cath removal (09/11/2011); Cataract extraction (Bilateral, 2008); Patellectomy (Left, 03/31/2017); and ORIF radial fracture (Bilateral, 03/29/2017).  No Known Allergies  No current facility-administered medications on file prior to encounter.    Current Outpatient Prescriptions on File Prior to Encounter  Medication Sig  . acetaminophen (TYLENOL) 325 MG tablet Take 2 tablets (650 mg total) by mouth every 6 (six) hours as needed for mild pain.  [name] amLODipine (NORVASC) 10 MG tablet Take 1 tablet (10 mg total) by mouth daily.  [name] aspirin EC 81 MG tablet Take 81 mg by mouth  daily.  [name] atorvastatin (LIPITOR) 20 MG tablet Take 20 mg by mouth at bedtime.  . bisacodyl (DULCOLAX) 10 MG suppository Place 1 suppository (10 mg total) rectally daily as needed for moderate constipation.  . Cholecalciferol (VITAMIN D) 2000 units tablet Take 2,000 Units by mouth daily.  . citalopram (CELEXA) 20 MG tablet Take 1 tablet (20 mg total) by mouth daily.  [name] docusate sodium (COLACE) 100 MG capsule Take 100 mg by mouth 2 (two) times daily. Hold for loose stool  . furosemide (LASIX) 40 MG tablet Take 1 tablet (40 mg total) by mouth daily.  [name] glimepiride (AMARYL) 2 MG tablet Take 1 tablet (2 mg total) by mouth daily before breakfast.  . insulin detemir (LEVEMIR) 100 UNIT/ML injection Inject 0.26 mLs (26 Units total) into the skin at bedtime.  [name] lisinopril (PRINIVIL,ZESTRIL) 40 MG tablet Take 1 tablet (40 mg total) by mouth daily.  . metFORMIN (GLUCOPHAGE-XR) 500 MG 24 hr tablet Take 2 tablets (1,000 mg total) by mouth 2 (two) times daily.  . metoCLOPramide (REGLAN) 5 MG tablet Take 1 tablet (5 mg total) by mouth 3 (three) times daily before meals.  . ondansetron (ZOFRAN) 4 MG tablet Take 4 mg by mouth every 6 (six) hours.   . potassium chloride SA (K-DUR,KLOR-CON) 20 MEQ tablet Take 1 tablet (20 mEq total) by mouth daily.  [name] senna (SENOKOT) 8.6 MG TABS tablet Take 1 tablet (8.6 mg total) by mouth daily.  [name] UNABLE TO FIND House 2.0/Mes pass: Drink 90 ml's by mouth three times a day for poor meal intake  . fenofibrate (TRICOR) 48 MG tablet Take  1 tablet (48 mg total) by mouth daily.  . solifenacin (VESICARE) 10 MG tablet Take 1 tablet (10 mg total) by mouth daily. (Patient not taking: Reported on 06/15/2017)    FAMILY HISTORY:  Her indicated that her mother is deceased. She indicated that her father is deceased. She indicated that her sister is deceased. She indicated that both of her brothers are deceased.    SOCIAL HISTORY: She  reports that she quit smoking about 4 years ago. Her  smoking use included Cigarettes. She has never used smokeless tobacco. She reports that she does not drink alcohol or use drugs.  REVIEW OF SYSTEMS:   Unable to obtain  VITAL SIGNS: BP 129/72 (BP Location: Left Arm)   Pulse 79   Temp 98.2 F (36.8 C) (Oral)   Resp 20   Ht 5\' 7"  (1.702 m)   Wt 186 lb 15.2 oz (84.8 kg)   SpO2 98%   BMI 29.28 kg/m   VENTILATOR SETTINGS: Vent Mode: PRVC FiO2 (%):  [100 %] 100 % Set Rate:  [22 bmp] 22 bmp Vt Set:  [490 mL] 490 mL PEEP:  [5 cmH20] 5 cmH20 Plateau Pressure:  [17 cmH20] 17 cmH20  INTAKE / OUTPUT: I/O last 3 completed shifts: In: 017 [P.O.:120; I.V.:845] Out: 1004 [Urine:1002; Stool:2]  PHYSICAL EXAMINATION: General: Ill appearing Neuro: able to follow simple commands HEENT: ETT in place Cardiovascular:  Regular, no murmur Lungs: b/l rhonchi Abdomen: soft, decreased bowel sounds, non tender Musculoskeletal: 1+ edema Skin: stage 1 sacral and stage 2 Lt heel ulcers  LABS:  BMET  Recent Labs Lab 06/23/17 0321 06/24/17 0500 06/25/17 0451  NA 140 141 140  K 3.0* 3.4* 3.1*  CL 104 100* 99*  CO2 31 32 34*  BUN 13 14 13   CREATININE 0.42* 0.62 0.53  GLUCOSE 119* 117* 91    Electrolytes  Recent Labs Lab 06/21/17 0424 06/22/17 0500 06/23/17 0321 06/24/17 0500 06/25/17 0451  CALCIUM 7.7* 7.8* 7.4* 7.6* 7.5*  MG 1.4* 1.8  --   --   --     CBC  Recent Labs Lab 06/23/17 0321 06/24/17 0500 06/25/17 0451  WBC 7.8 7.2 5.5  HGB 8.2* 8.5* 8.1*  HCT 25.3* 26.7* 25.4*  PLT 321 391 363    Coag's No results for input(s): APTT, INR in the last 168 hours.  Sepsis Markers No results for input(s): LATICACIDVEN, PROCALCITON, O2SATVEN in the last 168 hours.  ABG  Recent Labs Lab 06/19/17 0525 06/20/17 1429  PHART 7.266* 7.417  PCO2ART 47.2 35.9  PO2ART 101 220*    Liver Enzymes  Recent Labs Lab 06/19/17 1116  AST 19  ALT 18  ALKPHOS 181*  BILITOT 0.7  ALBUMIN 1.9*    Cardiac Enzymes  Recent  Labs Lab 06/18/17 1530 06/18/17 2345  TROPONINI 0.04* 0.03*    Glucose  Recent Labs Lab 06/24/17 1219 06/24/17 1718 06/24/17 2053 06/24/17 2346 06/25/17 0452 06/25/17 0737  GLUCAP 134* 116* 107* 104* 86 89    Imaging Dg Chest Port 1 View  Result Date: 06/25/2017 CLINICAL DATA:  Dyspnea EXAM: PORTABLE CHEST 1 VIEW COMPARISON:  06/22/2017 FINDINGS: Mild bilateral interstitial thickening similar in appearance to multiple prior exams. No pleural effusion or pneumothorax. No focal consolidation. Right jugular central venous catheter with the tip projecting over the cavoatrial junction. Pole posttraumatic deformity of the right proximal humerus. IMPRESSION: No active cardiopulmonary disease. Electronically Signed   By: Kathreen Devoid   On: 06/25/2017 09:35     STUDIES:  Abd u/s 8/14 >> GB sludge, hepatic steatosis CT abd/pelvis 8/15 >> mild colon wall thickening Echo 8/15 >> mild LVH, EF 55 to 60%  CULTURES: Urine 8/14 >> Klebsiella, Enterococcus Blood 8/14 >> negative  ANTIBIOTICS: Vancomycin 8/15 >> 8/23 Meropenem 8/15 >>   SIGNIFICANT EVENTS: 8/14 Admit 8/15 Off pressors 8/19 ETT 8/20 Self extubated 8/24 Pulled out Rt IJ CVL >> cardiac arrest, back to ICU  LINES/TUBES: ETT 8/24 >> Lt IJ CVL 8/24 >>   DISCUSSION: 76 yo female initially admitted for septic shock from UTI.  Course complicated by HCAP and VDRF.  She then likely developed air embolism leading to cardiac arrest and respiratory failure after she removed her central line.  ASSESSMENT / PLAN:  PULMONARY A: Acute hypoxic respiratory failure after cardiac arrest and HCAP. P:   Full vent support F/u CXR, ABG  CARDIOVASCULAR A:  Asystolic cardiac arrest after presumed air embolism 2nd to accidental removal of central line by patient. Acute on chronic diastolic CHF. Sinus bradycardia. Hx of LBBB. P:  Monitor hemodynamics  RENAL A:   Metabolic acidosis. Hypokalemia. P:   F/u ABG, BMET,  electrolytes  GASTROINTESTINAL A:   Abdominal pain. Severe protein calorie malnutrition. P:   NPO Protonix for SUP  HEMATOLOGIC A:   Anemia of critical illness. P:  F/u CBC SQ heparin for DVT prophylaxis  INFECTIOUS A:   Septic shock >> resolved. UTI. HCAP. P:   Day 11/14 of Abx, currently on meropenem  ENDOCRINE A:   No acute issues. P:   Monitor blood sugar  NEUROLOGIC A:   Acute metabolic encephalopathy. Hx of depression. P:   RASS goal: 0 to -1 Continue citalopram  CC time 63 minutes   Chesley Mires, MD [place] 06/25/2017, 12:11 PM Pager:  [phone] After 3pm call: [phone]

## 2017-06-25 NOTE — Progress Notes (Signed)
   06/25/17 1300  Clinical Encounter Type  Visited With Family  Visit Type Initial;Psychological support;Spiritual support;Critical Care;Code  Referral From Nurse  Consult/Referral To Chaplain  Spiritual Encounters  Spiritual Needs Emotional;Grief support;Other (Comment) (Pastoral Conversation/Support)  Stress Factors  Patient Stress Factors Not reviewed  Family Stress Factors Health changes;Major life changes   I visited with the patient's daughter per referral by the nurse while they were coding the patient. The patient's daughter was in the ICU consult room at the time of my arrival with the nursing director.  The patient's daughter was extremely tearful, and stated that she was "unable to talk" at this point. The patient's daughter stated that she called her sister to come up to the hospital. Patient's daughter stated that need to have some time for herself alone.   Please, contact Spiritual Care for further assistance.   [place] M.Div.

## 2017-06-25 NOTE — Progress Notes (Signed)
PROGRESS NOTE  Christy [name]  LPF:[id number] DOB: 09-09-1941 DOA: 06/15/2017 PCP: [name], MD  Brief Narrative:   76 year old female (speaks [name] language), from NH after knee surgery and deconditioning that followed in 04/2017, has history of ESBL Klebsiella pneumoniae UTI. She started to experience right lower quadrant abdominal pain. Her cx grew ESBL and she was treated with IV bactrim which was then changed to PO. On 8/14, she was found by NH staff with decreased consciousness, hypotensive with SBP in 60-70's. Lactic was was 7. She remained hypotensive with fluid challenge so she required pressor support and was admitted to the ICU on PCCM service.  Pt transitioned to [facility] care 8/17. Pt more rhonchorous on 8/17, transferred to SDU due to increased work of breathing. CCM consulted. CXR showed right upper lung lobe consolidation which is new since the admission.  Still feeling terrible this morning with increased abdominal pain.  CODE BLUE called this morning.  Patient pulled out right IJ TLC and became unresponsive with agonal breathing, continued to have a pulse.  She was bagged for several minutes, then intubated by anesthesia.  She subsequently became hypotensive.  She was bolused NS and she was given 1 amp of epi and levophed was ordered.  As she was transferring to the ICU, she suffered cardiac arrest and chest compressions were started.  Initial rhythm was asystole.  She was given two additional doses of epi before ROSC.  She was not cardioverted.    Significant event: 8/15 - on Levophed 8/16 - off pressor support 8/17 - transferred back to SDU 8/18 - BiPAP 8/24 - cardiopulmonary arrest, ROSC after 4 minutes  Assessment & Plan:   Active Problems:   Elevated troponin   Septic shock (HCC)   Pressure injury of skin   Protein-calorie malnutrition, severe   Acute respiratory failure with hypoxia (HCC)   Urinary tract infection due to ESBL Klebsiella   Generalized  weakness   Cardiopulmonary arrest.  Likely secondary to air embolism as this happened right after the patient pulled out her right IJ TLC.  Alternatively, she was complaining of increased abdominal pain on exam today.  She was also only on SCDs for DVT prophylaxis.     -  Intubated and transferred to ICU -  PCCM consulted and have taken over care:  Patient on vent and on vasopressors  Septic shock ([place]) / UTI ESBL - Septic shock on admission requiring pressor support from 8/14 - 8/16 - Urine culture - ESBL - Meropenem day 10 of 10  Acute respiratory failure with hypoxia / HCAP and possible acute on chronic diastolic heart failure - Patient was intubated from August 19 through August 20, self extubated August 20 - Chest x-ray prior to arrest:  No acute process  Acute kidney injury / Hypokalemia, due to septic shock, resolved  Hypokalemia, mild, due to diuresis - already received oral potassium today  Abdominal pain, recurred today - CT scan on admission which showed possible inflammatory or infectious colitis and also dilated common bile duct mildly increased -  Repeat CT abd/pelvis ordered today  Mild troponin elevation 0.05 early in admission - Likely in the setting of septic shock, levels consistent with demand ischemia -Patient was seen by cardiology in consultation, ischemic workup not required at this time   Acute metabolic encephalopathy - Good mental status this am   Protein-calorie malnutrition, severe - In the context of acute on chronic illness - Seen by nutritionist, supplements   Stage 1  sacral ulcer and stage 2 lateral left heel - Seen by WOC - Sacral ulcer stage 1, MASD left medial buttock - Stage 2 lateral left heel - WOC recommended bilateral prevalon boots, ointment to MASD, foam dressing to sacrum for prevention; low air loss mattress  Generalized weakness -  PT/OT recommending SNF  Patient told OT today that she wants to die.  I  think she seems depressed, however, she was confused during my interview this morning so needed conversation with family prior to changing to DNR.  Daughter expressed that she wants full code/full supportive measures today.   -  Palliative care consult already placed  DVT prophylaxis: heparin Code Status: full code  Family Communication: daughter at bedside Disposition Plan:  Transfer to ICU for further care   Consultants:   PCCM to Hospitalist transfer  Procedures:  none  Antimicrobials:  Anti-infectives    Start     Dose/Rate Route Frequency Ordered Stop   06/22/17 0800  vancomycin (VANCOCIN) IVPB 750 mg/150 ml premix  Status:  Discontinued     750 mg 150 mL/hr over 60 Minutes Intravenous Every 24 hours 06/22/17 0645 06/24/17 1156   06/18/17 1200  vancomycin (VANCOCIN) IVPB 1000 mg/200 mL premix  Status:  Discontinued     1,000 mg 200 mL/hr over 60 Minutes Intravenous Every 12 hours 06/18/17 1013 06/21/17 1307   06/17/17 1600  meropenem (MERREM) 1 g in sodium chloride 0.9 % 100 mL IVPB     1 g 200 mL/hr over 30 Minutes Intravenous Every 8 hours 06/17/17 1149     06/16/17 2200  meropenem (MERREM) 1 g in sodium chloride 0.9 % 100 mL IVPB  Status:  Discontinued     1 g 200 mL/hr over 30 Minutes Intravenous Every 12 hours 06/16/17 1034 06/17/17 1149   06/16/17 2000  vancomycin (VANCOCIN) IVPB 1000 mg/200 mL premix  Status:  Discontinued     1,000 mg 200 mL/hr over 60 Minutes Intravenous Every 24 hours 06/15/17 1939 06/16/17 1034   06/16/17 1800  vancomycin (VANCOCIN) 1,250 mg in sodium chloride 0.9 % 250 mL IVPB  Status:  Discontinued     1,250 mg 166.7 mL/hr over 90 Minutes Intravenous Every 24 hours 06/16/17 1034 06/17/17 1058   06/15/17 2200  meropenem (MERREM) 1 g in sodium chloride 0.9 % 100 mL IVPB  Status:  Discontinued     1 g 200 mL/hr over 30 Minutes Intravenous Every 12 hours 06/15/17 1939 06/15/17 2025   06/15/17 2200  meropenem (MERREM) 500 mg in sodium chloride 0.9  % 50 mL IVPB  Status:  Discontinued     500 mg 100 mL/hr over 30 Minutes Intravenous Every 12 hours 06/15/17 2025 06/16/17 1034   06/15/17 1815  vancomycin (VANCOCIN) IVPB 1000 mg/200 mL premix     1,000 mg 200 mL/hr over 60 Minutes Intravenous  Once 06/15/17 1809 06/15/17 2000   06/15/17 1245  ertapenem (INVANZ) 1 g in sodium chloride 0.9 % 50 mL IVPB  Status:  Discontinued     1 g 100 mL/hr over 30 Minutes Intravenous Daily 06/15/17 1221 06/15/17 1636       Subjective:  Via [name] interpreter. Coughing, feels sick.  Wants to die.  Having some nausea and abdominal pain this morning.  Denies vomiting.    Objective: Vitals:   06/25/17 1540 06/25/17 1550 06/25/17 1600 06/25/17 1610  BP:  105/64 110/66 125/78  Pulse: (!) 149 (!) 147 (!) 142 (!) 138  Resp: (!) 25 (!) 26 [name])  23 (!) 23  Temp:    98.1 F (36.7 C)  TempSrc:      SpO2: 100% 97% 98% 97%  Weight:      Height:        Intake/Output Summary (Last 24 hours) at 06/25/17 1623 Last data filed at 06/25/17 0900  Gross per 24 hour  Intake             1685 ml  Output                0 ml  Net             1685 ml   Filed Weights   06/23/17 0252 06/24/17 0435 06/25/17 0553  Weight: 88.5 kg (195 lb 1.7 oz) 88.4 kg (194 lb 15.2 oz) 84.8 kg (186 lb 15.2 oz)    Examination:  General exam:  Adult female, head hanging to side and too weak to keep upright.  Mild respiratory distress with SCM retractions and tachypnea.   HEENT:  NCAT, MMM Respiratory system:  Rales, rhonchi, and wheeze present.   Cardiovascular system: Regular rate and rhythm, normal S1/S2. No murmurs, rubs, gallops or clicks.  Warm extremities Gastrointestinal system: Normal active bowel sounds, soft, nondistended, TTP in the LLQ with moderate palpation. MSK:  Normal tone and bulk, 1+ pitting bilateral upper extremity edema and dependent lower extremity edema Neuro:  Upper extremities 4/5 bilaterally, face symmetric.  Lower extremities 3/5 bilaterally.  Sensation  intact to light touch.      Data Reviewed: I have personally reviewed following labs and imaging studies  CBC:  Recent Labs Lab 06/22/17 0500 06/23/17 0321 06/24/17 0500 06/25/17 0451 06/25/17 1150  WBC 9.2 7.8 7.2 5.5 10.5  HGB 8.1* 8.2* 8.5* 8.1* 8.1*  HCT 25.1* 25.3* 26.7* 25.4* 25.1*  MCV 90.9 90.0 89.6 90.7 91.9  PLT 304 321 391 363 193   Basic Metabolic Panel:  Recent Labs Lab 06/21/17 0424 06/22/17 0500 06/23/17 0321 06/24/17 0500 06/25/17 0451 06/25/17 1150  NA 146* 146* 140 141 140 140  K 3.5 3.6 3.0* 3.4* 3.1* 3.8  CL 114* 112* 104 100* 99* 103  CO2 _0 32 34* 28  GLUCOSE 158* 148* 119* 117* 91 245*  BUN 21* _1 CREATININE 0.73 0.61 0.42* 0.62 0.53 0.67  CALCIUM 7.7* 7.8* 7.4* 7.6* 7.5* 6.8*  MG 1.4* 1.8  --   --   --  1.4*   GFR: Estimated Creatinine Clearance: 67 mL/min (by C-G formula based on SCr of 0.67 mg/dL). Liver Function Tests:  Recent Labs Lab 06/19/17 1116 06/25/17 1150  AST 19 63*  ALT 18 25  ALKPHOS 181* 103  BILITOT 0.7 0.5  PROT 5.2* 4.7*  ALBUMIN 1.9* 1.6*   No results for input(s): LIPASE, AMYLASE in the last 168 hours. No results for input(s): AMMONIA in the last 168 hours. Coagulation Profile: No results for input(s): INR, PROTIME in the last 168 hours. Cardiac Enzymes:  Recent Labs Lab 06/18/17 2345  TROPONINI 0.03*   BNP (last 3 results) No results for input(s): PROBNP in the last 8760 hours. HbA1C: No results for input(s): HGBA1C in the last 72 hours. CBG:  Recent Labs Lab 06/24/17 2053 06/24/17 2346 06/25/17 0452 06/25/17 0737 06/25/17 1340  GLUCAP 107* 104* 86 89 194*   Lipid Profile: No results for input(s): CHOL, HDL, LDLCALC, TRIG, CHOLHDL, LDLDIRECT in the last 72 hours. Thyroid Function Tests: No results for input(s): TSH, T4TOTAL, FREET4, T3FREE, THYROIDAB  in the last 72 hours. Anemia Panel: No results for input(s): VITAMINB12, FOLATE, FERRITIN, TIBC, IRON, RETICCTPCT in  the last 72 hours. Urine analysis:    Component Value Date/Time   COLORURINE RED (A) 06/15/2017 1320   APPEARANCEUR CLOUDY (A) 06/15/2017 1320   LABSPEC 1.013 06/15/2017 1320   PHURINE 7.0 06/15/2017 1320   GLUCOSEU NEGATIVE 06/15/2017 1320   HGBUR SMALL (A) 06/15/2017 1320   BILIRUBINUR NEGATIVE 06/15/2017 1320   KETONESUR NEGATIVE 06/15/2017 1320   PROTEINUR NEGATIVE 06/15/2017 1320   UROBILINOGEN 0.2 11/27/2013 2012   NITRITE NEGATIVE 06/15/2017 1320   LEUKOCYTESUR TRACE (A) 06/15/2017 1320   Sepsis Labs: _0 (procalcitonin:4,lacticidven:4)  ) Recent Results (from the past 240 hour(s))  MRSA PCR Screening     Status: Abnormal   Collection Time: 06/15/17  6:30 PM  Result Value Ref Range Status   MRSA by PCR POSITIVE (A) NEGATIVE Final    Comment:        The GeneXpert MRSA Assay (FDA approved for NASAL specimens only), is one component of a comprehensive MRSA colonization surveillance program. It is not intended to diagnose MRSA infection nor to guide or monitor treatment for MRSA infections. RESULT CALLED TO, READ BACK BY AND VERIFIED WITH: Audree Camel [id number] @ 2205 BY J SCOTTON       Radiology Studies: Dg Abd 1 View  Result Date: 06/25/2017 CLINICAL DATA:  Orogastric placement 06/20/2017 EXAM: ABDOMEN - 1 VIEW COMPARISON:  None. FINDINGS: Orogastric tube enters the stomach, the tip passing at least to the gastric antrum. Far right side of the abdomen not included on the film. IMPRESSION: Orogastric tube tip at least in the gastric antrum. Electronically Signed   By: Nelson Chimes M.D.   On: 06/25/2017 14:51   Dg Chest Port 1 View  Result Date: 06/25/2017 CLINICAL DATA:  76 year old female status post central line placement. EXAM: PORTABLE CHEST 1 VIEW COMPARISON:  Chest radiograph from earlier the same day. FINDINGS: An endotracheal tube terminates approximately 4.5 cm above the carina. Nasogastric tube courses below the diaphragm the on the edge of the  film. There has been interval placement of a left IJ central venous catheter terminating at the proximal to mid SVC. Evaluation of the chest slightly limited due to patient rotation. There is suggestion of increased opacity in the visualized right lung, though this may be positional. No sizable pleural effusion or pneumothorax. No acute osseous abnormalities. IMPRESSION: 1. Interval placement of a left IJ central venous catheter terminating at the proximal/mid SVC. 2. Limited examination due to patient rotation with questionable increased parenchymal opacity at the right lung. Electronically Signed   By: Kristopher Oppenheim M.D.   On: 06/25/2017 12:51   Dg Chest Port 1 View  Result Date: 06/25/2017 CLINICAL DATA:  Dyspnea EXAM: PORTABLE CHEST 1 VIEW COMPARISON:  06/22/2017 FINDINGS: Mild bilateral interstitial thickening similar in appearance to multiple prior exams. No pleural effusion or pneumothorax. No focal consolidation. Right jugular central venous catheter with the tip projecting over the cavoatrial junction. Pole posttraumatic deformity of the right proximal humerus. IMPRESSION: No active cardiopulmonary disease. Electronically Signed   By: Kathreen Devoid   On: 06/25/2017 09:35   Dg Chest Port 1v Same Day  Result Date: 06/25/2017 CLINICAL DATA:  Status post intubation EXAM: PORTABLE CHEST 1 VIEW COMPARISON:  06/25/2018 FINDINGS: Endotracheal tube with the tip 4.7 cm above the carina. Nasogastric tube coursing below the diaphragm. Bilateral diffuse mild interstitial thickening. No pleural effusion or pneumothorax. Stable cardiomegaly. No acute osseous  abnormality. Old posttraumatic deformity of the right proximal humerus. IMPRESSION: 1. Endotracheal tube with the tip 4.7 cm above the carina. Electronically Signed   By: Kathreen Devoid   On: 06/25/2017 11:52     Scheduled Meds: . aspirin EC  81 mg Oral Daily  . chlorhexidine gluconate (MEDLINE KIT)  15 mL Mouth Rinse BID  . Chlorhexidine Gluconate Cloth   6 each Topical Daily  . [START ON 06/26/2017] citalopram  20 mg Per Tube Daily  . heparin subcutaneous  5,000 Units Subcutaneous Q8H  . insulin aspart  0-15 Units Subcutaneous Q4H  . iopamidol      . ipratropium-albuterol  3 mL Nebulization Q6H  . mouth rinse  15 mL Mouth Rinse QID  . pantoprazole sodium  40 mg Per Tube Q24H  . sodium chloride flush  10-40 mL Intracatheter Q12H   Continuous Infusions: . lactated ringers 50 mL/hr at 06/25/17 1330  . meropenem (MERREM) IV Stopped (06/25/17 0819)  . norepinephrine (LEVOPHED) Adult infusion    . phenylephrine (NEO-SYNEPHRINE) Adult infusion 20 mcg/min (06/25/17 1525)     LOS: 10 days    Time spent: 30 min    Janece Canterbury, MD [facility] Pager [phone]  If 7PM-7AM, please contact night-coverage [email or web address] Password [facility] 06/25/2017, 4:23 PM

## 2017-06-25 NOTE — Progress Notes (Signed)
Speech Language Pathology Discharge Patient Details Name: Christy [name] MRN: [id number] DOB: [birth year] Today's Date: 06/25/2017 Time:  -     Patient discharged from SLP services secondary to medical decline - will need to re-order SLP to resume therapy services.  Please see latest therapy progress note for current level of functioning and progress toward goals.    Progress and discharge plan discussed with patient and/or caregiver: Patient unable to participate in discharge planning and no caregivers available  GO    [name], [facility] SLP [phone]  [name], [name] 06/25/2017, 11:43 AM

## 2017-06-25 NOTE — Anesthesia Procedure Notes (Signed)
Procedure Name: Intubation Date/Time: 06/25/2017 11:02 AM Performed by: Lollie Sails Pre-anesthesia Checklist: Patient identified, Emergency Drugs available, Suction available, Patient being monitored and Timeout performed Patient Re-evaluated:Patient Re-evaluated prior to induction Oxygen Delivery Method: Ambu bag Preoxygenation: Pre-oxygenation with 100% oxygen Induction Type: IV induction Ventilation: Mask ventilation without difficulty Laryngoscope Size: Miller and 3 Grade View: Grade I Tube type: Subglottic suction tube Tube size: 7.5 mm Number of attempts: 1 Airway Equipment and Method: Stylet Placement Confirmation: ETT inserted through vocal cords under direct vision,  breath sounds checked- equal and bilateral and CO2 detector Tube secured with: Tape Dental Injury: Teeth and Oropharynx as per pre-operative assessment

## 2017-06-25 NOTE — Progress Notes (Signed)
Pt febrile to 101.3. [place] [facility] RN notified. Doctor on call ordered PRN tylenol and urine culture. Will continue to assess and monitor.

## 2017-06-25 NOTE — Progress Notes (Addendum)
Pt found in bed, restless. IJ cath d/c'ed by pt, found laying beside pt. Pressure applied. Dr. Sheran Fava and rapid response team paged immediately. New IV placed, vitals obtained. Pt consciousness declining. Code blue called.

## 2017-06-25 NOTE — Procedures (Signed)
Central Venous Catheter Insertion Procedure Note Christy [name] [id number] 03-04-1941  Procedure: Insertion of Central Venous Catheter Indications: Assessment of intravascular volume, Drug and/or fluid administration and Frequent blood sampling  Procedure Details Consent: Risks of procedure as well as the alternatives and risks of each were explained to the (patient/caregiver).  Consent for procedure obtained. Time Out: Verified patient identification, verified procedure, site/side was marked, verified correct patient position, special equipment/implants available, medications/allergies/relevent history reviewed, required imaging and test results available.  Performed  Maximum sterile technique was used including antiseptics, cap, gloves, gown, hand hygiene, mask and sheet. Skin prep: Chlorhexidine; local anesthetic administered  A antimicrobial bonded/coated triple lumen catheter was placed in the left internal jugular vein to 18 cm using the Seldinger technique.  Sutured x4 and bio-patch applied. Sterile dressing applied.   Evaluation Blood flow good Complications: No apparent complications Patient did tolerate procedure well. Chest X-ray ordered to verify placement.  CXR: pending.   Procedure performed under direct supervision of Dr. Halford Chessman and with ultrasound guidance for real time vessel cannulation.     [name], NP-C [facility] Pgr: [phone] or if no answer [phone] 06/25/2017, 12:11 PM

## 2017-06-25 NOTE — Progress Notes (Signed)
[facility] Progress Note Patient Name: Christy [name] DOB: 1940/11/10 MRN: [id number]   Date of Service  06/25/2017  HPI/Events of Note  Notified of fever. Patient currently on meropenem for sepsis.   eICU Interventions  1. Tylenol via tube when necessary fever 2. Stat urinalysis 3. Stat blood, urine, and tracheal aspirate cultures 4. Continuing meropenem      Intervention Category Major Interventions: Sepsis - evaluation and management  Tera Partridge 06/25/2017, 8:29 PM

## 2017-06-25 NOTE — Progress Notes (Signed)
Nutrition Follow-up  DOCUMENTATION CODES:   Severe malnutrition in context of acute illness/injury  INTERVENTION:  - Should pt remain intubated >/= 24 hours and BP and MAP sufficient for TF initiation, recommend Vital High Protein @ 35 mL/hr with 30 mL Prostat TID. This regimen will provide 1140 kcal, 118 grams of protein, and 702 mL free water.   NUTRITION DIAGNOSIS:   Malnutrition related to acute illness as evidenced by percent weight loss, energy intake < or equal to 50% for > or equal to 5 days. -ongoing  GOAL:   Provide needs based on ASPEN/SCCM guidelines -unable to meet at this time.   MONITOR:   Vent status, Weight trends, Labs, I & O's  REASON FOR ASSESSMENT:   Ventilator  ASSESSMENT:   76 y.o. female currently residing in a nursing home after deconditioning following a knee surgery in June. Recently found to have an ESBL Klebsiella pneumonia urinary tract infection. Treated briefly with Bactrim intravenously which was subsequently converted to oral. Initially the patient did improve then was noted to have decreased level of consciousness today and was hypotensive with systolic blood pressure in the 60-70s. Patient was found have a lactic acidosis as well as acute renal failure  8/24 Code Blue called late this AM' s/p cardiac arrest. Pt now re-intubated and OGT in place. Estimated nutrition needs updated based on current weight of 84.8 kg which is -3.6 kg from yesterday; will monitor weight trends closely. BMI is borderline obesity so used calculations for that category as pt had been trending as obese until today.   Patient is currently intubated on ventilator support MV: 11.6 L/min Temp (24hrs), Avg:98.1 F (36.7 C), Min:97.9 F (36.6 C), Max:98.2 F (36.8 C) Propofol: none BP: 74/47 and MAP: 53  Medications reviewed; sliding scale Novolog, 40 mg Protonix per OGT/day, 40 mEq oral KCl x1 dose this AM.  Labs reviewed; CBGs: 86 and 89 mg/dL this AM, K: 3.1 mmol/L,  Cl: 99 mmol/L, Ca: 7.5 mg/dL.  IVF: LR @ 50 mL/hr Drip: Levo @ 30 mcg/min    8/23 - Patient ate 25% of her dinner meal on 8/22.  - For breakfast, patient was ordered nectar thick OJ, Magic cups, scrambled eggs, oatmeal, [place] sausage, and strawberry yogurt.  - SLP evaluated on 8/22: found mild aspiration risk, acute dysphagia - RD ordered Mighty Shake II which provides very concentrated amounts of protein and calories in a nectar thick liquid (23 grams of protein and 500 kcal).  - [name] weight is stable.   8/20 - Pt was intubated yesterday at ~1300 and self extubated this AM at [facility].  - Per PCCM NP note at ~0830 this AM, provide pt with ice chips and then advance to CLD in 4 hours as tolerated.  - Weight trending back down toward admission weight and current weight is now -0.5 kg from admission weight.    2 g IV Mg sulfate x1 run today Mg: 1.4 mg/dL.    Diet Order:  Diet NPO time specified  Skin:  Wound (see comment) (Stage 1 sacral and L heel pressure injuries)  Last BM:  8/23  Height:   Ht Readings from Last 1 Encounters:  06/25/17 5\' 7"  (1.702 m)    Weight:   Wt Readings from Last 1 Encounters:  06/25/17 186 lb 15.2 oz (84.8 kg)    Ideal Body Weight:  61.4 kg  BMI:  Body mass index is 29.28 kg/m.  Estimated Nutritional Needs:   Kcal:  [phone] (11-14 kcal/kg)  Protein:  123 grams (2 grams/kg)  Fluid:  >1.8L/day   EDUCATION NEEDS:   Education needs no appropriate at this time    Jarome Matin, MS, RD, LDN, [facility] Inpatient Clinical Dietitian Pager # [phone] After hours/weekend pager # [phone]

## 2017-06-25 NOTE — Progress Notes (Addendum)
0030: patient observed messing with IJ dressing. Safety mitts applied to prevent self-harm. No s/s of distress. O2 98, HR 82/ Continue to monitor.

## 2017-06-26 ENCOUNTER — Inpatient Hospital Stay (HOSPITAL_COMMUNITY): Payer: Medicare Other

## 2017-06-26 DIAGNOSIS — Z515 Encounter for palliative care: Secondary | ICD-10-CM

## 2017-06-26 DIAGNOSIS — R9431 Abnormal electrocardiogram [ECG] [EKG]: Secondary | ICD-10-CM

## 2017-06-26 DIAGNOSIS — Z7189 Other specified counseling: Secondary | ICD-10-CM

## 2017-06-26 LAB — BASIC METABOLIC PANEL
Anion gap: 9 (ref 5–15)
BUN: 17 mg/dL (ref 6–20)
CALCIUM: 7.4 mg/dL — AB (ref 8.9–10.3)
CO2: 29 mmol/L (ref 22–32)
CREATININE: 0.87 mg/dL (ref 0.44–1.00)
Chloride: 105 mmol/L (ref 101–111)
GFR calc non Af Amer: >60 mL/min (ref >60)
GLUCOSE: 195 mg/dL — AB (ref 65–99)
Potassium: 3.5 mmol/L (ref 3.5–5.1)
Sodium: 143 mmol/L (ref 135–145)

## 2017-06-26 LAB — GLUCOSE, CAPILLARY
GLUCOSE-CAPILLARY: 161 mg/dL — AB (ref 65–99)
GLUCOSE-CAPILLARY: 206 mg/dL — AB (ref 65–99)
Glucose-Capillary: 211 mg/dL — ABNORMAL HIGH (ref 65–99)
Glucose-Capillary: 209 mg/dL — ABNORMAL HIGH (ref 65–99)
Glucose-Capillary: 174 mg/dL — ABNORMAL HIGH (ref 65–99)
Glucose-Capillary: 207 mg/dL — ABNORMAL HIGH (ref 65–99)

## 2017-06-26 LAB — MAGNESIUM
MAGNESIUM: 1.8 mg/dL (ref 1.7–2.4)
MAGNESIUM: 1.8 mg/dL (ref 1.7–2.4)
MAGNESIUM: 1.7 mg/dL (ref 1.7–2.4)

## 2017-06-26 LAB — CBC
HCT: 30.6 % — ABNORMAL LOW (ref 36.0–46.0)
Hemoglobin: 9.6 g/dL — ABNORMAL LOW (ref 12.0–15.0)
MCH: 28.5 pg (ref 26.0–34.0)
MCHC: 31.4 g/dL (ref 30.0–36.0)
MCV: 90.8 fL (ref 78.0–100.0)
PLATELETS: 444 10*3/uL — AB (ref 150–400)
RBC: 3.37 MIL/uL — ABNORMAL LOW (ref 3.87–5.11)
RDW: 15.5 % (ref 11.5–15.5)
WBC: 11.2 10*3/uL — ABNORMAL HIGH (ref 4.0–10.5)

## 2017-06-26 LAB — PHOSPHORUS
PHOSPHORUS: 2.3 mg/dL — AB (ref 2.5–4.6)
PHOSPHORUS: 2.1 mg/dL — AB (ref 2.5–4.6)
Phosphorus: 2.3 mg/dL — ABNORMAL LOW (ref 2.5–4.6)

## 2017-06-26 LAB — ECHOCARDIOGRAM COMPLETE
HEIGHTINCHES: 67 in
WEIGHTICAEL: 3259.28 [oz_av]

## 2017-06-26 MED ORDER — PRO-STAT SUGAR FREE PO LIQD
30.0000 mL | Freq: Three times a day (TID) | ORAL | Status: DC
  Administered 2017-06-26 (×2): 30 mL
  Filled 2017-06-26 (×2): qty 30

## 2017-06-26 MED ORDER — VANCOMYCIN HCL IN DEXTROSE 750-5 MG/150ML-% IV SOLN
750.0000 mg | INTRAVENOUS | Status: DC
  Administered 2017-06-26 – 2017-06-28 (×3): 750 mg via INTRAVENOUS
  Filled 2017-06-26 (×3): qty 150

## 2017-06-26 MED ORDER — DEXTROSE 5 % IV SOLN
2.0000 g | Freq: Three times a day (TID) | INTRAVENOUS | Status: DC
  Administered 2017-06-26 – 2017-06-28 (×6): 2 g via INTRAVENOUS
  Filled 2017-06-26 (×8): qty 2

## 2017-06-26 MED ORDER — PERFLUTREN LIPID MICROSPHERE
1.0000 mL | INTRAVENOUS | Status: AC | PRN
  Administered 2017-06-26: 1 mL via INTRAVENOUS
  Filled 2017-06-26: qty 10

## 2017-06-26 MED ORDER — INSULIN ASPART 100 UNIT/ML ~~LOC~~ SOLN
0.0000 [IU] | SUBCUTANEOUS | Status: DC
  Administered 2017-06-26: 3 [IU] via SUBCUTANEOUS
  Administered 2017-06-26 – 2017-06-27 (×4): 5 [IU] via SUBCUTANEOUS
  Administered 2017-06-27 (×2): 3 [IU] via SUBCUTANEOUS
  Administered 2017-06-27 (×2): 5 [IU] via SUBCUTANEOUS
  Administered 2017-06-28 (×2): 2 [IU] via SUBCUTANEOUS
  Administered 2017-06-28 (×2): 3 [IU] via SUBCUTANEOUS
  Administered 2017-06-28: 2 [IU] via SUBCUTANEOUS
  Administered 2017-06-28: 3 [IU] via SUBCUTANEOUS
  Administered 2017-06-29: 2 [IU] via SUBCUTANEOUS
  Administered 2017-06-29: 5 [IU] via SUBCUTANEOUS
  Administered 2017-06-29 (×3): 3 [IU] via SUBCUTANEOUS
  Administered 2017-06-30: 2 [IU] via SUBCUTANEOUS
  Administered 2017-06-30 (×5): 3 [IU] via SUBCUTANEOUS
  Administered 2017-07-01: 5 [IU] via SUBCUTANEOUS
  Administered 2017-07-01: 3 [IU] via SUBCUTANEOUS
  Administered 2017-07-01: 5 [IU] via SUBCUTANEOUS
  Administered 2017-07-01: 3 [IU] via SUBCUTANEOUS
  Administered 2017-07-01: 5 [IU] via SUBCUTANEOUS
  Administered 2017-07-01 – 2017-07-02 (×4): 3 [IU] via SUBCUTANEOUS
  Administered 2017-07-02: 8 [IU] via SUBCUTANEOUS
  Administered 2017-07-02: 5 [IU] via SUBCUTANEOUS
  Administered 2017-07-02: 2 [IU] via SUBCUTANEOUS
  Administered 2017-07-03: 3 [IU] via SUBCUTANEOUS
  Administered 2017-07-03 (×4): 5 [IU] via SUBCUTANEOUS
  Administered 2017-07-03 (×2): 8 [IU] via SUBCUTANEOUS
  Administered 2017-07-04: 5 [IU] via SUBCUTANEOUS
  Administered 2017-07-04 (×4): 3 [IU] via SUBCUTANEOUS
  Administered 2017-07-05: 5 [IU] via SUBCUTANEOUS
  Administered 2017-07-05: 3 [IU] via SUBCUTANEOUS
  Administered 2017-07-05: 5 [IU] via SUBCUTANEOUS

## 2017-06-26 MED ORDER — VITAL HIGH PROTEIN PO LIQD
1000.0000 mL | ORAL | Status: DC
  Administered 2017-06-26: 1000 mL
  Filled 2017-06-26: qty 1000

## 2017-06-26 MED ORDER — VITAL HIGH PROTEIN PO LIQD
1000.0000 mL | ORAL | Status: DC
  Administered 2017-06-26: 1000 mL
  Administered 2017-06-27: 06:00:00
  Filled 2017-06-26 (×2): qty 1000

## 2017-06-26 MED ORDER — PRO-STAT SUGAR FREE PO LIQD
30.0000 mL | Freq: Two times a day (BID) | ORAL | Status: DC
  Administered 2017-06-26: 30 mL
  Filled 2017-06-26: qty 30

## 2017-06-26 MED ORDER — VITAL HIGH PROTEIN PO LIQD: 1000.0000 mL | ORAL | Status: DC

## 2017-06-26 MED ORDER — IPRATROPIUM-ALBUTEROL 0.5-2.5 (3) MG/3ML IN SOLN: 3.0000 mL | RESPIRATORY_TRACT | Status: DC | PRN

## 2017-06-26 NOTE — Consult Note (Signed)
Consultation Note Date: 06/26/2017   Patient Name: Christy [name]  DOB: [birth year]  MRN: [id number]  Age / Sex: 76 y.o., female  PCP: [name], MD Referring Physician: Chesley Mires, MD  Reason for Consultation: Establishing goals of care  HPI/Patient Profile: 76 y.o. female  admitted on 06/15/2017   76 yo female had knee surgery and was at [facility].  She was found to have weakness and RLQ discomfort.  She then had altered mental status, and was hypotensive.  She was tx for septic shock and UTI.  She developed respiratory failure from HCAP with concern for aspiration and required intubation.  She extubated herself, and was transferred to medical ward.  On 06/25/17 she was noted to have pulled her Rt IJ central line out.  She then developed bradycardia, hypotension, altered mental status and respiratory failure.  She required intubation by anesthesia.  After being transferred to ICU she developed asystole.  She required CPR.  She eventually regained pulse and blood pressure.  She also was able to follow simple commands. She is now on PCCM service while she is in ICU.   A palliative consult was requested prior to her acute event on 06-25-17.    Clinical Assessment and Goals of Care:  76 yo lady resting in bed, on the ventilator is noted, there being no family at the bedside, call was placed and discussed with her daughter.   Daughter is appreciative of the information she is receiving from the ICU staff, she is also appreciative of the care being provided for the patient currently. I introduced myself and palliative care as follows: Palliative medicine is specialized medical care for people living with serious illness. It focuses on providing relief from the symptoms and stress of a serious illness. The goal is to improve quality of life for both the patient and the family.     NEXT OF KIN  patient has 2  daughters and one son.   SUMMARY OF RECOMMENDATIONS    Full code, full scope of treatment.  PMT will follow hospital course and will remain available to facilitate goals of care conversations and decision making, depending on the patient's disease trajectory and hospital course.  No additional recommendations at this time.   Code Status/Advance Care Planning:  Full code    Symptom Management:    as above   Palliative Prophylaxis:   Delirium Protocol  Additional Recommendations (Limitations, Scope, Preferences):  Full Scope Treatment  Psycho-social/Spiritual:   Desire for further Chaplaincy support:no  Additional Recommendations: Caregiving  Support/Resources  Prognosis:   Unable to determine  Discharge Planning: To Be Determined      Primary Diagnoses: Present on Admission: . Septic shock ([place]) . Elevated troponin   I have reviewed the medical record, interviewed the patient and family, and examined the patient. The following aspects are pertinent.  Past Medical History:  Diagnosis Date  . Abnormal ECG    Inferior Q waves  . Cancer ([place])    stage II right breast cancer  . Cataract   .  Diabetes mellitus   . Hypercholesterolemia   . Hyperlipidemia   . Hypertension   . Osteoporosis   . Overactive bladder    Social History   Social History  . Marital status: Widowed    Spouse name: N/A  . Number of children: N/A  . Years of education: N/A   Occupational History  . Retired    Social History Main Topics  . Smoking status: Former Smoker    Types: Cigarettes    Quit date: 12/27/2012  . Smokeless tobacco: Never Used     Comment: quit 2009  . Alcohol use No  . Drug use: No  . Sexual activity: No   Other Topics Concern  . 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 She is [place] but can understand [name] and [place]. She has a son and daughter who help in her care.   Family History  Problem Relation Age of Onset    . Stroke Mother   . Stroke Father   . Hypertension Sister   . Stroke Sister   . Stroke Brother    Scheduled Meds: . aspirin EC  81 mg Oral Daily  . chlorhexidine gluconate (MEDLINE KIT)  15 mL Mouth Rinse BID  . Chlorhexidine Gluconate Cloth  6 each Topical Daily  . citalopram  20 mg Per Tube Daily  . feeding supplement (PRO-STAT SUGAR FREE 64)  30 mL Per Tube TID BM  . [START ON 06/27/2017] feeding supplement (VITAL HIGH PROTEIN)  1,000 mL Per Tube Q24H  . heparin subcutaneous  5,000 Units Subcutaneous Q8H  . insulin aspart  0-15 Units Subcutaneous Q4H  . mouth rinse  15 mL Mouth Rinse QID  . pantoprazole sodium  40 mg Per Tube Q24H  . sodium chloride flush  10-40 mL Intracatheter Q12H   Continuous Infusions: . cefTAZidime (FORTAZ)  IV Stopped (06/26/17 1334)  . lactated ringers 10 mL/hr at 06/26/17 1205  . norepinephrine (LEVOPHED) Adult infusion 15 mcg/min (06/26/17 1510)  . vancomycin Stopped (06/26/17 1133)   PRN Meds:.acetaminophen (TYLENOL) oral liquid 160 mg/5 mL, albuterol, fentaNYL (SUBLIMAZE) injection, iopamidol, ipratropium-albuterol, midazolam, ondansetron (ZOFRAN) IV, perflutren lipid microspheres (DEFINITY) IV suspension, sodium chloride flush Medications Prior to Admission:  Prior to Admission medications   Medication Sig Start Date End Date Taking? Authorizing Provider  acetaminophen (TYLENOL) 325 MG tablet Take 2 tablets (650 mg total) by mouth every 6 (six) hours as needed for mild pain. 04/05/17  Yes Verner Mould, MD  amLODipine (NORVASC) 10 MG tablet Take 1 tablet (10 mg total) by mouth daily. 04/05/17  Yes Verner Mould, MD  aspirin EC 81 MG tablet Take 81 mg by mouth daily.   Yes [provider]  atorvastatin (LIPITOR) 20 MG tablet Take 20 mg by mouth at bedtime.   Yes [provider]  bisacodyl (DULCOLAX) 10 MG suppository Place 1 suppository (10 mg total) rectally daily as needed for moderate constipation. 04/30/17  Yes  Jani Gravel, MD  bisacodyl (DULCOLAX) 5 MG EC tablet Take 5 mg by mouth once.   Yes [provider]  Cholecalciferol (VITAMIN D) 2000 units tablet Take 2,000 Units by mouth daily.   Yes [provider]  citalopram (CELEXA) 20 MG tablet Take 1 tablet (20 mg total) by mouth daily. 04/06/17  Yes [name], [name], MD  docusate sodium (COLACE) 100 MG capsule Take 100 mg by mouth 2 (two) times daily. Hold for loose stool   Yes [provider]  furosemide (  LASIX) 40 MG tablet Take 1 tablet (40 mg total) by mouth daily. 12/31/16  Yes [name], [name], MD  glimepiride (AMARYL) 2 MG tablet Take 1 tablet (2 mg total) by mouth daily before breakfast. 12/31/16  Yes [name], [name], MD  insulin detemir (LEVEMIR) 100 UNIT/ML injection Inject 0.26 mLs (26 Units total) into the skin at bedtime. 04/05/17  Yes [name], [name], MD  lidocaine (LIDODERM) 5 % Place 1 patch onto the skin every 12 (twelve) hours. Remove & Discard patch within 12 hours or as directed by MD   Yes [provider]  lisinopril (PRINIVIL,ZESTRIL) 40 MG tablet Take 1 tablet (40 mg total) by mouth daily. 12/31/16  Yes [name], [name], MD  magnesium hydroxide (MILK OF MAGNESIA) 400 MG/5ML suspension Take 20 mLs by mouth once as needed for mild constipation.   Yes [provider]  metFORMIN (GLUCOPHAGE-XR) 500 MG 24 hr tablet Take 2 tablets (1,000 mg total) by mouth 2 (two) times daily. 01/13/17  Yes [name], [name], MD  metoCLOPramide (REGLAN) 5 MG tablet Take 1 tablet (5 mg total) by mouth 3 (three) times daily before meals. 05/01/17  Yes Jani Gravel, MD  mirtazapine (REMERON) 15 MG tablet Take 7.5 mg by mouth at bedtime. 04/30/17  Yes [provider]  ondansetron (ZOFRAN) 4 MG tablet Take 4 mg by mouth every 6 (six) hours.    Yes [provider]  potassium chloride SA (K-DUR,KLOR-CON) 20 MEQ tablet Take 1 tablet (20 mEq total) by mouth daily. 04/30/17  Yes Jani Gravel, MD  promethazine  (PHENERGAN) 25 MG/ML injection Inject 25 mg into the muscle once.   Yes [provider]  senna (SENOKOT) 8.6 MG TABS tablet Take 1 tablet (8.6 mg total) by mouth daily. 04/06/17  Yes [name], [name], MD  sulfamethoxazole-trimethoprim (BACTRIM DS,SEPTRA DS) 800-160 MG tablet Take 1 tablet by mouth 2 (two) times daily.   Yes [provider]  UNABLE TO [place] 2.0/Mes pass: Drink 90 ml's by mouth three times a day for poor meal intake   Yes [provider]  fenofibrate (TRICOR) 48 MG tablet Take 1 tablet (48 mg total) by mouth daily. 12/31/16   [name], Earna Coder, MD  solifenacin (VESICARE) 10 MG tablet Take 1 tablet (10 mg total) by mouth daily. Patient not taking: Reported on 06/15/2017 12/31/16   [name], MD   No Known Allergies Review of Systems Non verbal on vent.   Physical Exam Appears chronically ill On vent, has ETT Regular No edema  Vital Signs: BP (!) 122/52   Pulse 96   Temp (!) 100.8 F (38.2 C)   Resp 19   Ht _0  (1.702 m)   Wt 92.4 kg (203 lb 11.3 oz)   SpO2 96%   BMI 31.90 kg/m  Pain Assessment: CPOT POSS *See Group Information*: 1-Acceptable,Awake and alert Pain Score: Asleep   SpO2: SpO2: 96 % O2 Device:SpO2: 96 % O2 Flow Rate: .O2 Flow Rate (L/min): 2 L/min  IO: Intake/output summary:  Intake/Output Summary (Last 24 hours) at 06/26/17 1554 Last data filed at 06/26/17 1200  Gross per 24 hour  Intake           2846.6 ml  Output              233 ml  Net           2613.6 ml    LBM: Last BM Date: 06/25/17 Baseline Weight: Weight: 88 kg (194 lb 0.1 oz) Most recent weight: Weight: 92.4 kg (203  lb 11.3 oz)     Palliative Assessment/Data:     Time In:  8 Time Out:9 Time Total: 60 Greater than 50%  of this time was spent counseling and coordinating care related to the above assessment and plan.  Signed by: Loistine Chance, MD  [phone]  Please contact Palliative Medicine Team phone at [phone] for questions and  concerns.  For individual provider: See Shea Evans

## 2017-06-26 NOTE — Progress Notes (Signed)
Brief Nutrition Note  Consult received for enteral/tube feeding initiation and management. RD not on site today.   Patient was evaluated by RD yesterday with TF recommendations left, will implement these with RD follow up tomorrow.    Admitting Dx: Encounter for central line placement [Z45.2] Acute kidney injury ([place]) [N17.9] Abdominal pain [R10.9] Septic shock ([place]) [A41.9, R65.21]   Labs:   Recent Labs Lab 06/25/17 0451 06/25/17 1150 06/26/17 0300 06/26/17 1003  NA 140 140 143  --   K 3.1* 3.8 3.5  --   CL 99* 103 105  --   CO2 34* 28 29  --   BUN 13 14 17   --   CREATININE 0.53 0.67 0.87  --   CALCIUM 7.5* 6.8* 7.4*  --   MG  --  1.4* 1.8 1.8  PHOS  --   --  2.3* 2.3*  GLUCOSE 91 245* 195*  --     Burtis Junes RD, LDN, CNSC Clinical Nutrition Pager: [phone] 06/26/2017 11:07 AM

## 2017-06-26 NOTE — Progress Notes (Signed)
PULMONARY / CRITICAL CARE MEDICINE   Name: Jacquese Cassarino MRN: [id number] DOB: 1940/12/23    ADMISSION DATE:  06/15/2017  REFERRING MD:  Dr. Sheran Fava, [facility]  CHIEF COMPLAINT:  Bradycardia  HISTORY OF PRESENT ILLNESS:   76 yo female had knee surgery and was at SNF.  She was found to have weakness and RLQ discomfort.  She then had altered mental status, and was hypotensive.  She was tx for septic shock and UTI.  She developed respiratory failure from HCAP with concern for aspiration and required intubation.  She extubated herself, and was transferred to medical ward.  On 06/25/17 she was noted to have pulled her Rt IJ central line out.  She then developed bradycardia, hypotension, altered mental status and respiratory failure.  She required intubation by anesthesia.  After being transferred to ICU she developed asystole.  She required CPR.  She eventually regained pulse and blood pressure.  She also was able to follow simple commands.  PCCM asked to resume primary care while in ICU.  SUBJECTIVE: Febrile overnight.  VITAL SIGNS: BP 100/65   Pulse (!) 108   Temp (!) 102.6 F (39.2 C)   Resp (!) 6   Ht 5\' 7"  (1.702 m)   Wt 203 lb 11.3 oz (92.4 kg)   SpO2 98%   BMI 31.90 kg/m   VENTILATOR SETTINGS: Vent Mode: PRVC FiO2 (%):  [40 %-100 %] 40 % Set Rate:  [18 bmp-22 bmp] 18 bmp Vt Set:  [490 mL] 490 mL PEEP:  [5 cmH20] 5 cmH20 Plateau Pressure:  [11 cmH20-18 cmH20] 11 cmH20  INTAKE / OUTPUT: I/O last 3 completed shifts: In: 3053.8 [P.O.:120; I.V.:2013.8; NG/GT:120; IV Piggyback:800] Out: 110 [Urine:110]  PHYSICAL EXAMINATION:  General - ill appearing Eyes - pupils reactive ENT - ETT in place Cardiac - regular, tachycardic, no murmur Chest - no wheeze, rales Abd - soft, non tender Ext - no edema Skin - sacral and Lt heel ulcers Neuro - RASS -1  LABS:  BMET  Recent Labs Lab 06/25/17 0451 06/25/17 1150 06/26/17 0300  NA 140 140 143  K 3.1* 3.8 3.5  CL 99* 103 105   CO2 34* 28 29  BUN 13 14 17   CREATININE 0.53 0.67 0.87  GLUCOSE 91 245* 195*    Electrolytes  Recent Labs Lab 06/22/17 0500  06/25/17 0451 06/25/17 1150 06/26/17 0300  CALCIUM 7.8*  < > 7.5* 6.8* 7.4*  MG 1.8  --   --  1.4* 1.8  PHOS  --   --   --   --  2.3*  < > = values in this interval not displayed.  CBC  Recent Labs Lab 06/25/17 0451 06/25/17 1150 06/26/17 0300  WBC 5.5 10.5 11.2*  HGB 8.1* 8.1* 9.6*  HCT 25.4* 25.1* 30.6*  PLT 363 396 444*    Coag's No results for input(s): APTT, INR in the last 168 hours.  Sepsis Markers No results for input(s): LATICACIDVEN, PROCALCITON, O2SATVEN in the last 168 hours.  ABG  Recent Labs Lab 06/20/17 1429 06/25/17 1220  PHART 7.417 7.441  PCO2ART 35.9 43.7  PO2ART 220* 155*    Liver Enzymes  Recent Labs Lab 06/19/17 1116 06/25/17 1150  AST 19 63*  ALT 18 25  ALKPHOS 181* 103  BILITOT 0.7 0.5  ALBUMIN 1.9* 1.6*    Cardiac Enzymes No results for input(s): TROPONINI, PROBNP in the last 168 hours.  Glucose  Recent Labs Lab 06/25/17 1340 06/25/17 1729 06/25/17 1952 06/25/17 2356 06/26/17 0355  06/26/17 0740  GLUCAP 194* 274* 252* 238* 161* 211*    Imaging Dg Abd 1 View  Result Date: 06/25/2017 CLINICAL DATA:  Orogastric placement 06/20/2017 EXAM: ABDOMEN - 1 VIEW COMPARISON:  None. FINDINGS: Orogastric tube enters the stomach, the tip passing at least to the gastric antrum. Far right side of the abdomen not included on the film. IMPRESSION: Orogastric tube tip at least in the gastric antrum. Electronically Signed   By: Nelson Chimes M.D.   On: 06/25/2017 14:51   Dg Chest Port 1 View  Result Date: 06/25/2017 CLINICAL DATA:  76 year old female status post central line placement. EXAM: PORTABLE CHEST 1 VIEW COMPARISON:  Chest radiograph from earlier the same day. FINDINGS: An endotracheal tube terminates approximately 4.5 cm above the carina. Nasogastric tube courses below the diaphragm the on the  edge of the film. There has been interval placement of a left IJ central venous catheter terminating at the proximal to mid SVC. Evaluation of the chest slightly limited due to patient rotation. There is suggestion of increased opacity in the visualized right lung, though this may be positional. No sizable pleural effusion or pneumothorax. No acute osseous abnormalities. IMPRESSION: 1. Interval placement of a left IJ central venous catheter terminating at the proximal/mid SVC. 2. Limited examination due to patient rotation with questionable increased parenchymal opacity at the right lung. Electronically Signed   By: Kristopher Oppenheim M.D.   On: 06/25/2017 12:51   Dg Chest Port 1v Same Day  Result Date: 06/25/2017 CLINICAL DATA:  Status post intubation EXAM: PORTABLE CHEST 1 VIEW COMPARISON:  06/25/2018 FINDINGS: Endotracheal tube with the tip 4.7 cm above the carina. Nasogastric tube coursing below the diaphragm. Bilateral diffuse mild interstitial thickening. No pleural effusion or pneumothorax. Stable cardiomegaly. No acute osseous abnormality. Old posttraumatic deformity of the right proximal humerus. IMPRESSION: 1. Endotracheal tube with the tip 4.7 cm above the carina. Electronically Signed   By: Kathreen Devoid   On: 06/25/2017 11:52     STUDIES:  Abd u/s 8/14 >> GB sludge, hepatic steatosis CT abd/pelvis 8/15 >> mild colon wall thickening Echo 8/15 >> mild LVH, EF 55 to 60%  CULTURES: Urine 8/14 >> Klebsiella, Enterococcus Blood 8/14 >> negative  ANTIBIOTICS: Vancomycin 8/15 >> 8/23 Meropenem 8/15 >> 8/25 Fortaz 8/25 >> Vancomycin 8/25 >>   SIGNIFICANT EVENTS: 8/14 Admit 8/15 Off pressors 8/19 ETT 8/20 Self extubated 8/24 Pulled out Rt IJ CVL >> cardiac arrest, back to ICU 8/25 New fever  LINES/TUBES: ETT 8/24 >> Lt IJ CVL 8/24 >>   DISCUSSION: 76 yo female initially admitted for septic shock from UTI.  Course complicated by HCAP and VDRF.  She then likely developed air embolism  leading to cardiac arrest and respiratory failure after she removed her central line.  ASSESSMENT / PLAN:  PULMONARY A: Acute hypoxic respiratory failure after cardiac arrest and HCAP. P:   Full vent support F/u CXR Scheduled BDs  CARDIOVASCULAR A:  Asystolic cardiac arrest after presumed air embolism 2nd to accidental removal of central line by patient with cardiogenic shock. Acute on chronic diastolic CHF. Sinus bradycardia. Hx of LBBB. P:  Wean pressors to keep MAP > 65 Even fluid balance  RENAL A:   Metabolic acidosis. Hypokalemia. P:   F/u ABG, BMET, electrolytes  GASTROINTESTINAL A:   Abdominal pain >> improved. Severe protein calorie malnutrition. P:   Tube feeds  HEMATOLOGIC A:   Anemia of critical illness. P:  F/u CBC SQ heparin for DVT prophylaxis  INFECTIOUS A:   UTI >> resolved. HCAP. New fever 8/25. P:   Repeat cultures Change Abx 8/25 to vancomycin, fortaz  ENDOCRINE A:   Hyperglycemia. P:   SSI  NEUROLOGIC A:   Acute metabolic encephalopathy. Hx of depression. P:   RASS goal: 0 to -1 Continue citalopram  CC time 32 minutes   Chesley Mires, MD [place] 06/26/2017, 9:27 AM Pager:  [phone] After 3pm call: [phone]

## 2017-06-26 NOTE — Progress Notes (Signed)
*  PRELIMINARY RESULTS* Echocardiogram 2D Echocardiogram with definity has been performed.  Christy [name] 06/26/2017, 3:27 PM

## 2017-06-26 NOTE — Progress Notes (Signed)
Pharmacy Antibiotic Note  Christy [name] is a 76 y.o. female admitted on 06/15/2017 with septic shock, Enterococcus and Klebsiella UTI, and developed respiratory failure from HCAP/aspiration requiring  intubation.  She self-extubated, transferred out of ICU.  Then she pulled out IJ line, developed asystole, required CPR and was transferred back to ICU for hypotension and respiratory failure requiring re-intubation.  She was treated with Vancomycin (last dose given 8/23) and Meropenem (last dose given 8/25).  Now, Pharmacy has been consulted for Vancomycin and Ceftazidime dosing.  Today, 06/26/2017: Day # 11 Meropenem SCr 0.87, CrCl ~ 64 (baseline ~ 0.5-0.6) WBC increased to 11.2 Tm 102.7  Plan: Ceftazidime 2g IV q8h Vancomycin 750 mg IV q24h. Measure Vanc trough at steady state. Follow up renal fxn, culture results, and clinical course.    Height: 5\' 7"  (170.2 cm) Weight: 203 lb 11.3 oz (92.4 kg) IBW/kg (Calculated) : 61.6  Temp (24hrs), Avg:101.3 F (38.5 C), Min:98.1 F (36.7 C), Max:102.7 F (39.3 C)   Recent Labs Lab 06/21/17 1215 06/22/17 0500 06/23/17 0321 06/24/17 0500 06/25/17 0451 06/25/17 1150 06/26/17 0300  WBC  --  9.2 7.8 7.2 5.5 10.5 11.2*  CREATININE  --  0.61 0.42* 0.62 0.53 0.67 0.87  VANCOTROUGH 38*  --   --   --   --   --   --   VANCORANDOM  --  21  --   --   --   --   --     Estimated Creatinine Clearance: 64.2 mL/min (by C-G formula based on SCr of 0.87 mg/dL).    No Known Allergies  Antimicrobials this admission: 8/14 Invanz x1 8/14 Vanc>>8/16, resume 8/17>> 8/23, resume 8/25 >> 8/14 Meropenem>>8/25 8/25 Ceftazidime >>  Dose adjustments this admission: 8/15 increase meropenem from 500mg  to 1g q12h, vanc 1g to 1250mg  q24h for improved SCr 8/16 increase meropenem from 1g q12h to 1g q8h for improved SCr 8/21 VR at 0500= 21 (~29 hrs after last dose)  Microbiology results: 6/26 UCx: ESBL Klebsiella -only sens imipenem 6/3 UCx:  Enterococcus faecalis: S=Amp, Nirtrofurantoin, Vanc, I=Levofloxacin 8/14BCx: NGF 8/14UCx: >100k Klebsiella pneumoniae (S imip, I nitro, R all others)                  >100k Enterococcus faecalis (S amp, nitro, vanc; R levo) 8/14 MRSA PCR: positive 8/24 Trach aspirate: pending 8/24 UCx: 8/24 BCx:    Thank you for allowing pharmacy to be a part of this patient's care.  Gretta Arab PharmD, BCPS Pager [phone] 06/26/2017 10:10 AM

## 2017-06-26 NOTE — Progress Notes (Signed)
Provided RN with ordered sputum sample.

## 2017-06-27 ENCOUNTER — Inpatient Hospital Stay (HOSPITAL_COMMUNITY): Payer: Medicare Other

## 2017-06-27 LAB — GLUCOSE, CAPILLARY
GLUCOSE-CAPILLARY: 246 mg/dL — AB (ref 65–99)
GLUCOSE-CAPILLARY: 168 mg/dL — AB (ref 65–99)
Glucose-Capillary: 201 mg/dL — ABNORMAL HIGH (ref 65–99)
Glucose-Capillary: 239 mg/dL — ABNORMAL HIGH (ref 65–99)
Glucose-Capillary: 168 mg/dL — ABNORMAL HIGH (ref 65–99)
Glucose-Capillary: 159 mg/dL — ABNORMAL HIGH (ref 65–99)

## 2017-06-27 LAB — CBC
HEMATOCRIT: 26.4 % — AB (ref 36.0–46.0)
HEMOGLOBIN: 8.5 g/dL — AB (ref 12.0–15.0)
MCH: 28.9 pg (ref 26.0–34.0)
MCHC: 32.2 g/dL (ref 30.0–36.0)
MCV: 89.8 fL (ref 78.0–100.0)
Platelets: 386 10*3/uL (ref 150–400)
RBC: 2.94 MIL/uL — ABNORMAL LOW (ref 3.87–5.11)
RDW: 15.6 % — ABNORMAL HIGH (ref 11.5–15.5)
WBC: 10.1 10*3/uL (ref 4.0–10.5)

## 2017-06-27 LAB — URINE CULTURE
CULTURE: NO GROWTH
CULTURE: NO GROWTH
SPECIAL REQUESTS: NORMAL

## 2017-06-27 LAB — BASIC METABOLIC PANEL
ANION GAP: 7 (ref 5–15)
BUN: 25 mg/dL — AB (ref 6–20)
CHLORIDE: 103 mmol/L (ref 101–111)
CO2: 29 mmol/L (ref 22–32)
Calcium: 7.2 mg/dL — ABNORMAL LOW (ref 8.9–10.3)
Creatinine, Ser: 0.71 mg/dL (ref 0.44–1.00)
GFR calc Af Amer: >60 mL/min (ref >60)
Glucose, Bld: 261 mg/dL — ABNORMAL HIGH (ref 65–99)
POTASSIUM: 3.1 mmol/L — AB (ref 3.5–5.1)
SODIUM: 139 mmol/L (ref 135–145)

## 2017-06-27 LAB — MAGNESIUM
Magnesium: 1.7 mg/dL (ref 1.7–2.4)
Magnesium: 1.9 mg/dL (ref 1.7–2.4)

## 2017-06-27 LAB — C DIFFICILE QUICK SCREEN W PCR REFLEX
C DIFFICILE (CDIFF) TOXIN: NEGATIVE
C DIFFICLE (CDIFF) ANTIGEN: POSITIVE — AB

## 2017-06-27 LAB — PHOSPHORUS
PHOSPHORUS: 1.9 mg/dL — AB (ref 2.5–4.6)
PHOSPHORUS: 2.8 mg/dL (ref 2.5–4.6)

## 2017-06-27 MED ORDER — CITALOPRAM HYDROBROMIDE 20 MG PO TABS
20.0000 mg | ORAL_TABLET | Freq: Every day | ORAL | Status: DC
  Administered 2017-06-29 – 2017-07-02 (×4): 20 mg via ORAL
  Filled 2017-06-27 (×5): qty 1

## 2017-06-27 MED ORDER — FUROSEMIDE 10 MG/ML IJ SOLN
40.0000 mg | Freq: Four times a day (QID) | INTRAMUSCULAR | Status: AC
  Administered 2017-06-27 (×2): 40 mg via INTRAVENOUS
  Filled 2017-06-27 (×2): qty 4

## 2017-06-27 MED ORDER — PANTOPRAZOLE SODIUM 40 MG IV SOLR
40.0000 mg | INTRAVENOUS | Status: DC
  Administered 2017-06-27 – 2017-06-28 (×2): 40 mg via INTRAVENOUS
  Filled 2017-06-27 (×2): qty 40

## 2017-06-27 MED ORDER — POTASSIUM CHLORIDE 20 MEQ/15ML (10%) PO SOLN
40.0000 meq | Freq: Once | ORAL | Status: AC
  Administered 2017-06-27: 40 meq
  Filled 2017-06-27: qty 30

## 2017-06-27 MED ORDER — ACETAMINOPHEN 325 MG PO TABS
650.0000 mg | ORAL_TABLET | Freq: Four times a day (QID) | ORAL | Status: DC | PRN
  Administered 2017-06-30 – 2017-07-01 (×2): 650 mg via ORAL
  Filled 2017-06-27 (×2): qty 2

## 2017-06-27 MED ORDER — ACETAMINOPHEN 650 MG RE SUPP: 650.0000 mg | RECTAL | Status: DC | PRN

## 2017-06-27 MED ORDER — MAGNESIUM SULFATE 2 GM/50ML IV SOLN
2.0000 g | Freq: Once | INTRAVENOUS | Status: AC
  Administered 2017-06-27: 2 g via INTRAVENOUS
  Filled 2017-06-27: qty 50

## 2017-06-27 MED ORDER — POTASSIUM PHOSPHATES 15 MMOLE/5ML IV SOLN
30.0000 mmol | Freq: Once | INTRAVENOUS | Status: AC
  Administered 2017-06-27: 30 mmol via INTRAVENOUS
  Filled 2017-06-27: qty 10

## 2017-06-27 NOTE — Progress Notes (Signed)
[facility] Progress Note Patient Name: Christy [name] DOB: 1941/02/26 MRN: [id number]   Date of Service  06/27/2017  HPI/Events of Note  Patient has had 4 or 5 loose, watery stool in last 24 hours. Request to send C. Difficile panel.   eICU Interventions  Will order: 1. C. Difficile Panel.  2. Enteric precautions.      Intervention Category Major Interventions: Infection - evaluation and management  Arienne Gartin [place] 06/27/2017, 5:29 PM

## 2017-06-27 NOTE — Progress Notes (Signed)
Placed PT on 2 lpm [place] post extubation- current Sp02 96%. RN aware.

## 2017-06-27 NOTE — Procedures (Signed)
Extubation Procedure Note  Patient Details:   Name: [name] DOB: 02/23/1941 MRN: [id number]   Airway Documentation:     Evaluation  O2 sats:2 stable throughout Complications: No apparent complications Patient did tolerate procedure well. Bilateral Breath Sounds: Clear, Diminished   Yes  Baldwin [place] Nannette 06/27/2017, 10:06 AM  Positive cuff leak pre extubation.

## 2017-06-27 NOTE — Progress Notes (Signed)
Nutrition Follow-up  DOCUMENTATION CODES:   Severe malnutrition in context of acute illness/injury  INTERVENTION:   Diet advancement per MD  If patient is re-intubated, resume Vital High Protein @ 35 mL/hr with 30 mL Prostat TID. This regimen will provide 1140 kcal, 118 grams of protein, and 702 mL free water.   RD will continue to monitor plan  NUTRITION DIAGNOSIS:   Malnutrition (severe) related to acute illness, dysphagia as evidenced by percent weight loss, energy intake < or equal to 50% for > or equal to 5 days.  Ongoing.  GOAL:   Patient will meet greater than or equal to 90% of their needs  Not meeting.  MONITOR:   Labs, Weight trends, Skin, I & O's  REASON FOR ASSESSMENT:   Consult Enteral/tube feeding initiation and management  ASSESSMENT:   76 y.o. female currently residing in a nursing home after deconditioning following a knee surgery in June. Recently found to have an ESBL Klebsiella pneumonia urinary tract infection. Treated briefly with Bactrim intravenously which was subsequently converted to oral. Initially the patient did improve then was noted to have decreased level of consciousness today and was hypotensive with systolic blood pressure in the 60-70s. Patient was found have a lactic acidosis as well as acute renal failure  8/26:  Pt was intubated 8/24. Pt pulled out a central line. TF began 8/25. Pt now extubated and no longer receiving Vital HP @ 35 ml/hr with 30 ml Prostat TID as of 0930. Currently NPO, awaiting SLP evaluation. Pt was previously on dysphagia 2 diet with nectar thick liquids prior to intubation. Pt was not consuming much, ~25% of meals.  Palliative following. Will monitor for any GOC decisions.  Medications: IV Lasix every 6 hours, IV Protonix daily, Lactated Ringers infusion at 10 ml/hr, K-PHOS infusion once  Labs reviewed: CBGs: 201-246 Low K, Phos  -showing some signs of refeeding syndrome Mg WNL  8/24: -Code Blue called  late this AM' s/p cardiac arrest. Pt now re-intubated and OGT in place. Estimated nutrition needs updated based on current weight of 84.8 kg which is -3.6 kg from yesterday; will monitor weight trends closely.   Diet Order:  Diet NPO time specified  Skin:  Wound (see comment) (Stage 1 sacral and L heel pressure injuries)  Last BM:  8/25  Height:   Ht Readings from Last 1 Encounters:  06/25/17 5\' 7"  (1.702 m)    Weight:   Wt Readings from Last 1 Encounters:  06/27/17 205 lb 0.4 oz (93 kg)    Ideal Body Weight:  61.4 kg  BMI:  Body mass index is 32.11 kg/m.  Estimated Nutritional Needs:   Kcal:  1900-2100  Protein:  105-115g  Fluid:  1.8L/day  EDUCATION NEEDS:   Education needs no appropriate at this time  Clayton Bibles, MS, RD, LDN Pager: [phone] After Hours Pager: [phone]

## 2017-06-27 NOTE — Progress Notes (Signed)
PULMONARY / CRITICAL CARE MEDICINE   Name: Christy [name] MRN: [id number] DOB: 1941/03/24    ADMISSION DATE:  06/15/2017  REFERRING MD:  Dr. Sheran Fava, [facility]  CHIEF COMPLAINT:  Bradycardia  HISTORY OF PRESENT ILLNESS:   76 yo female had knee surgery and was at SNF.  She was found to have weakness and RLQ discomfort.  She then had altered mental status, and was hypotensive.  She was tx for septic shock and UTI.  She developed respiratory failure from HCAP with concern for aspiration and required intubation.  She extubated herself, and was transferred to medical ward.  On 06/25/17 she was noted to have pulled her Rt IJ central line out.  She then developed bradycardia, hypotension, altered mental status and respiratory failure.  She required intubation by anesthesia.  After being transferred to ICU she developed asystole.  She required CPR.  She eventually regained pulse and blood pressure.  She also was able to follow simple commands.  PCCM asked to resume primary care while in ICU.  SUBJECTIVE: Tolerating SBT.  VITAL SIGNS: BP 123/69   Pulse 93   Temp 100 F (37.8 C)   Resp (!) 23   Ht 5\' 7"  (1.702 m)   Wt 205 lb 0.4 oz (93 kg)   SpO2 96%   BMI 32.11 kg/m   VENTILATOR SETTINGS: Vent Mode: CPAP;PSV FiO2 (%):  [30 %] 30 % Set Rate:  [18 bmp] 18 bmp Vt Set:  [490 mL] 490 mL PEEP:  [5 cmH20] 5 cmH20 Pressure Support:  [5 cmH20] 5 cmH20 Plateau Pressure:  [16 cmH20-17 cmH20] 17 cmH20  INTAKE / OUTPUT: I/O last 3 completed shifts: In: 3173.3 [I.V.:1792.9; Other:30; NG/GT:900.4; IV Piggyback:450] Out: 768 [Urine:768]  PHYSICAL EXAMINATION:  General - alert Eyes - pupils reactive ENT - ETT in place Cardiac - regular, no murmur Chest - no wheeze Abd - soft, non tender Ext - 2+ edema Skin - sacral and Lt heel ulcers Neuro - RASS 0  LABS:  BMET  Recent Labs Lab 06/25/17 1150 06/26/17 0300 06/27/17 0350  NA 140 143 139  K 3.8 3.5 3.1*  CL 103 105 103  CO2 28 29 29    BUN 14 17 25*  CREATININE 0.67 0.87 0.71  GLUCOSE 245* 195* 261*    Electrolytes  Recent Labs Lab 06/25/17 1150  06/26/17 0300 06/26/17 1003 06/26/17 1655 06/27/17 0350  CALCIUM 6.8*  --  7.4*  --   --  7.2*  MG 1.4*  --  1.8 1.8 1.7 1.7  PHOS  --   < > 2.3* 2.3* 2.1* 1.9*  < > = values in this interval not displayed.  CBC  Recent Labs Lab 06/25/17 1150 06/26/17 0300 06/27/17 0350  WBC 10.5 11.2* 10.1  HGB 8.1* 9.6* 8.5*  HCT 25.1* 30.6* 26.4*  PLT 396 444* 386    Coag's No results for input(s): APTT, INR in the last 168 hours.  Sepsis Markers No results for input(s): LATICACIDVEN, PROCALCITON, O2SATVEN in the last 168 hours.  ABG  Recent Labs Lab 06/20/17 1429 06/25/17 1220  PHART 7.417 7.441  PCO2ART 35.9 43.7  PO2ART 220* 155*    Liver Enzymes  Recent Labs Lab 06/25/17 1150  AST 63*  ALT 25  ALKPHOS 103  BILITOT 0.5  ALBUMIN 1.6*    Cardiac Enzymes No results for input(s): TROPONINI, PROBNP in the last 168 hours.  Glucose  Recent Labs Lab 06/26/17 1248 06/26/17 1641 06/26/17 1948 06/26/17 2331 06/27/17 0351 06/27/17 0756  GLUCAP  206* 209* 174* 207* 246* 201*    Imaging Dg Chest Port 1 View  Result Date: 06/27/2017 CLINICAL DATA:  Respiratory failure.  Breast cancer. EXAM: PORTABLE CHEST 1 VIEW COMPARISON:  06/26/2017. FINDINGS: Support tubes and lines stable. Cardiomegaly. Small effusions and basilar atelectasis redemonstrated. IMPRESSION: Stable chest. Electronically Signed   By: Staci Righter M.D.   On: 06/27/2017 07:10     STUDIES:  Abd u/s 8/14 >> GB sludge, hepatic steatosis CT abd/pelvis 8/15 >> mild colon wall thickening Echo 8/15 >> mild LVH, EF 55 to 60% Echo 8/25 >> EF 20 to 25%  CULTURES: Urine 8/14 >> Klebsiella, Enterococcus Blood 8/14 >> negative Urine 8/24 >> negative Sputum 8/24 >>  Sputum 8/25 >> Blood 8/25 >>   ANTIBIOTICS: Vancomycin 8/15 >> 8/23 Meropenem 8/15 >> 8/25 Fortaz 8/25  >> Vancomycin 8/25 >>   SIGNIFICANT EVENTS: 8/14 Admit 8/15 Off pressors 8/19 ETT 8/20 Self extubated 8/24 Pulled out Rt IJ CVL >> cardiac arrest, back to ICU 8/25 New fever  LINES/TUBES: ETT 8/24 >> Lt IJ CVL 8/24 >>   DISCUSSION: 76 yo female initially admitted for septic shock from UTI.  Course complicated by HCAP and VDRF.  She then likely developed air embolism leading to cardiac arrest and respiratory failure after she removed her central line.  ASSESSMENT / PLAN:  PULMONARY A: Acute hypoxic respiratory failure after cardiac arrest and HCAP. P:   Proceed with extubation trial 8/26  CARDIOVASCULAR A:  Asystolic cardiac arrest after presumed air embolism 2nd to accidental removal of central line by patient with cardiogenic shock. Acute systolic CHF after cardiac arrest. Acute on chronic diastolic CHF. Sinus bradycardia. Hx of LBBB. P:  Lasix 40 mg IV x two doses on 8/26 Pressors to maintain MAP > 65 Will eventually need assessment by heart failure team  RENAL A:   Metabolic acidosis >> resolved. Hypokalemia, hypophosphatemia, hypomagnesemia. P:   F/u electrolytes  GASTROINTESTINAL A:   Abdominal pain >> improved. Severe protein calorie malnutrition. Dysphagia. P:   Will need assessment with speech after extubation  HEMATOLOGIC A:   Anemia of critical illness. P:  F/u CBC SQ heparin for DVT prophylaxis  INFECTIOUS A:   UTI >> resolved. HCAP. New fever 8/25. P:   Changed abx 8/25 F/u cultures  ENDOCRINE A:   Hyperglycemia. P:   SSI  NEUROLOGIC A:   Acute metabolic encephalopathy. Hx of depression. P:   Continue citalopram  CC time 31 minutes  Chesley Mires, MD [facility] Pulmonary/Critical Care 06/27/2017, 9:31 AM Pager:  [phone] After 3pm call: [phone]

## 2017-06-27 NOTE — Progress Notes (Signed)
[name] son (pronounced Ry-lin-ko) was bedside when I arrived. He said ystrdy his mom did not have a good day. He said today he has hope b/c she is doing btr. He said he had gone to his church and prayed this morning. He said he had been talking to his mom. Realizing the importance of time spent w/her, I made our visit brief so he could continue to visit w/her. I offered assistance and hydration. He was appreciative of visit. Please page if additional support is needed. [place], [place]   06/27/17 1900  Clinical Encounter Type  Visited With Family

## 2017-06-27 NOTE — Progress Notes (Signed)
[facility] Progress Note Patient Name: Christy [name] DOB: November 07, 1940 MRN: [id number]   Date of Service  06/27/2017  HPI/Events of Note  Hypokalemia, hypophos and hypomag  eICU Interventions  Potassium, Phos, and Mag replaced     Intervention Category Intermediate Interventions: Electrolyte abnormality - evaluation and management  DETERDING,ELIZABETH 06/27/2017, 5:03 AM

## 2017-06-28 ENCOUNTER — Inpatient Hospital Stay (HOSPITAL_COMMUNITY): Payer: Medicare Other

## 2017-06-28 LAB — CBC
HCT: 24.5 % — ABNORMAL LOW (ref 36.0–46.0)
Hemoglobin: 7.9 g/dL — ABNORMAL LOW (ref 12.0–15.0)
MCH: 28.6 pg (ref 26.0–34.0)
MCHC: 32.2 g/dL (ref 30.0–36.0)
MCV: 88.8 fL (ref 78.0–100.0)
PLATELETS: 322 10*3/uL (ref 150–400)
RBC: 2.76 MIL/uL — AB (ref 3.87–5.11)
RDW: 15.4 % (ref 11.5–15.5)
WBC: 6.2 10*3/uL (ref 4.0–10.5)

## 2017-06-28 LAB — BASIC METABOLIC PANEL
Anion gap: 6 (ref 5–15)
BUN: 19 mg/dL (ref 6–20)
CALCIUM: 6.6 mg/dL — AB (ref 8.9–10.3)
CO2: 31 mmol/L (ref 22–32)
CREATININE: 0.57 mg/dL (ref 0.44–1.00)
Chloride: 102 mmol/L (ref 101–111)
GFR calc non Af Amer: >60 mL/min (ref >60)
GLUCOSE: 131 mg/dL — AB (ref 65–99)
Potassium: 2.8 mmol/L — ABNORMAL LOW (ref 3.5–5.1)
Sodium: 139 mmol/L (ref 135–145)

## 2017-06-28 LAB — MAGNESIUM
MAGNESIUM: 2.6 mg/dL — AB (ref 1.7–2.4)
Magnesium: 1.6 mg/dL — ABNORMAL LOW (ref 1.7–2.4)
Magnesium: 2.2 mg/dL (ref 1.7–2.4)

## 2017-06-28 LAB — PHOSPHORUS
PHOSPHORUS: 2.7 mg/dL (ref 2.5–4.6)
Phosphorus: 3 mg/dL (ref 2.5–4.6)

## 2017-06-28 LAB — GLUCOSE, CAPILLARY
GLUCOSE-CAPILLARY: 176 mg/dL — AB (ref 65–99)
GLUCOSE-CAPILLARY: 155 mg/dL — AB (ref 65–99)
Glucose-Capillary: 124 mg/dL — ABNORMAL HIGH (ref 65–99)
Glucose-Capillary: 122 mg/dL — ABNORMAL HIGH (ref 65–99)
Glucose-Capillary: 124 mg/dL — ABNORMAL HIGH (ref 65–99)

## 2017-06-28 LAB — CLOSTRIDIUM DIFFICILE BY PCR: CDIFFPCR: POSITIVE — AB

## 2017-06-28 MED ORDER — OSMOLITE 1.5 CAL PO LIQD
1000.0000 mL | ORAL | Status: DC
  Administered 2017-06-28: 1000 mL
  Filled 2017-06-28 (×2): qty 1000

## 2017-06-28 MED ORDER — FREE WATER
30.0000 mL | Freq: Four times a day (QID) | Status: DC
  Administered 2017-06-28 – 2017-06-30 (×8): 30 mL

## 2017-06-28 MED ORDER — VITAL HIGH PROTEIN PO LIQD
1000.0000 mL | ORAL | Status: DC
  Filled 2017-06-28: qty 1000

## 2017-06-28 MED ORDER — FUROSEMIDE 10 MG/ML IJ SOLN
40.0000 mg | Freq: Two times a day (BID) | INTRAMUSCULAR | Status: AC
  Administered 2017-06-28 – 2017-06-29 (×2): 40 mg via INTRAVENOUS
  Filled 2017-06-28 (×2): qty 4

## 2017-06-28 MED ORDER — POTASSIUM CHLORIDE 20 MEQ/15ML (10%) PO SOLN
40.0000 meq | Freq: Once | ORAL | Status: AC
  Administered 2017-06-28: 40 meq
  Filled 2017-06-28: qty 30

## 2017-06-28 MED ORDER — POTASSIUM CHLORIDE 10 MEQ/100ML IV SOLN
10.0000 meq | INTRAVENOUS | Status: AC
  Administered 2017-06-28 (×4): 10 meq via INTRAVENOUS
  Filled 2017-06-28 (×3): qty 100

## 2017-06-28 MED ORDER — MAGNESIUM SULFATE 4 GM/100ML IV SOLN
4.0000 g | Freq: Once | INTRAVENOUS | Status: AC
  Administered 2017-06-28: 4 g via INTRAVENOUS
  Filled 2017-06-28: qty 100

## 2017-06-28 MED ORDER — PRO-STAT SUGAR FREE PO LIQD: 30.0000 mL | Freq: Two times a day (BID) | ORAL | Status: DC

## 2017-06-28 MED ORDER — FAMOTIDINE IN NACL 20-0.9 MG/50ML-% IV SOLN
20.0000 mg | INTRAVENOUS | Status: DC
  Administered 2017-06-28 – 2017-06-29 (×2): 20 mg via INTRAVENOUS
  Filled 2017-06-28 (×2): qty 50

## 2017-06-28 MED ORDER — PRO-STAT SUGAR FREE PO LIQD
30.0000 mL | Freq: Three times a day (TID) | ORAL | Status: DC
  Administered 2017-06-28 – 2017-06-29 (×6): 30 mL
  Filled 2017-06-28 (×6): qty 30

## 2017-06-28 MED ORDER — VANCOMYCIN 50 MG/ML ORAL SOLUTION
125.0000 mg | Freq: Four times a day (QID) | ORAL | Status: AC
Start: 2017-06-28 — End: 2017-07-08
  Administered 2017-06-28 – 2017-07-06 (×35): 125 mg via ORAL
  Filled 2017-06-28 (×42): qty 2.5

## 2017-06-28 NOTE — Progress Notes (Signed)
PULMONARY / CRITICAL CARE MEDICINE   Name: Christy [name] MRN: [id number] DOB: 01-Mar-1941    ADMISSION DATE:  06/15/2017  REFERRING MD:  Dr. Sheran Fava, [facility]  CHIEF COMPLAINT:  Bradycardia  BRIEF SUMMARY:   76 y/o female had knee surgery and was at SNF.  She was found to have weakness and RLQ discomfort.  She then had altered mental status, and was hypotensive.  She was tx for septic shock and UTI.  She developed respiratory failure from HCAP with concern for aspiration and required intubation.  She extubated herself, and was transferred to medical ward.  On 06/25/17 she was noted to have pulled her Rt IJ central line out.  She then developed bradycardia, hypotension, altered mental status and respiratory failure.  She required intubation by anesthesia.  After being transferred to ICU she developed asystole.  She required CPR.  She eventually regained pulse and blood pressure.  She also was able to follow simple commands.  PCCM asked to resume primary care while in ICU.  SUBJECTIVE:  Tolerating extubation.  K/Mg replaced per [facility]. C-Diff positive per pharmacy. Remains in positive balance but neg 2.2L in last 24 hours  VITAL SIGNS: BP (!) 121/51   Pulse 82   Temp 99 F (37.2 C)   Resp (!) 21   Ht 5\' 7"  (1.702 m)   Wt 203 lb 0.7 oz (92.1 kg)   SpO2 93%   BMI 31.80 kg/m   VENTILATOR SETTINGS:    INTAKE / OUTPUT: I/O last 3 completed shifts: In: 2573.1 [I.V.:1002.6; NG/GT:1120.5; IV Piggyback:450] Out: 2979 [Urine:3915]  PHYSICAL EXAMINATION: General: elderly femaele in NAD, sitting up in bed HEENT: MM pink/dry, no jvd, L IJ TLC c/d/i Neuro: awakens, alert, interpreter utilized for exam CV: s1s2 rrr, no m/r/g PULM: even/non-labored, lungs bilaterally clear anterior, diminished lower bilaterally  GX:QJJH, non-tender, bsx4 active  Extremities: warm/dry, 2-3+ pitting edema  Skin: no rashes or lesions  LABS:  BMET  Recent Labs Lab 06/26/17 0300 06/27/17 0350 06/28/17 0429   NA 143 139 139  K 3.5 3.1* 2.8*  CL 105 103 102  CO2 29 29 31   BUN 17 25* 19  CREATININE 0.87 0.71 0.57  GLUCOSE 195* 261* 131*    Electrolytes  Recent Labs Lab 06/26/17 0300  06/26/17 1655 06/27/17 0350 06/27/17 1643 06/28/17 0429  CALCIUM 7.4*  --   --  7.2*  --  6.6*  MG 1.8  < > 1.7 1.7 1.9 1.6*  PHOS 2.3*  < > 2.1* 1.9* 2.8  --   < > = values in this interval not displayed.  CBC  Recent Labs Lab 06/26/17 0300 06/27/17 0350 06/28/17 0429  WBC 11.2* 10.1 6.2  HGB 9.6* 8.5* 7.9*  HCT 30.6* 26.4* 24.5*  PLT 444* 386 322    Coag's No results for input(s): APTT, INR in the last 168 hours.  Sepsis Markers No results for input(s): LATICACIDVEN, PROCALCITON, O2SATVEN in the last 168 hours.  ABG  Recent Labs Lab 06/25/17 1220  PHART 7.441  PCO2ART 43.7  PO2ART 155*    Liver Enzymes  Recent Labs Lab 06/25/17 1150  AST 63*  ALT 25  ALKPHOS 103  BILITOT 0.5  ALBUMIN 1.6*    Cardiac Enzymes No results for input(s): TROPONINI, PROBNP in the last 168 hours.  Glucose  Recent Labs Lab 06/27/17 1204 06/27/17 1645 06/27/17 1937 06/27/17 2320 06/28/17 0431 06/28/17 0754  GLUCAP 239* 168* 168* 159* 124* 122*    Imaging No results found.  STUDIES:  Abd u/s 8/14 >> GB sludge, hepatic steatosis CT abd/pelvis 8/15 >> mild colon wall thickening Echo 8/15 >> mild LVH, EF 55 to 60% Echo 8/25 >> EF 20 to 25%  CULTURES: Urine 8/14 >> Klebsiella, Enterococcus Blood 8/14 >> negative Urine 8/24 >> negative Sputum 8/24 >>  Sputum 8/25 >> Blood 8/25 >>  C-Diff 8/26 >> positive antigen, PCR positive  ANTIBIOTICS: Vancomycin 8/15 >> 8/23 Meropenem 8/15 >> 8/25 Fortaz 8/25 >> 8/27 Vancomycin 8/25 >> 8/27   SIGNIFICANT EVENTS: 8/14 Admit 8/15 Off pressors 8/19 ETT 8/20 Self extubated 8/24 Pulled out Rt IJ CVL >> cardiac arrest, back to ICU 8/25 New fever 8/26 Extubated 8/27 C-diff positive  LINES/TUBES: ETT 8/24 >> 8/26 Lt IJ CVL  8/24 >>   DISCUSSION: 76 yo female initially admitted for septic shock from UTI.  Course complicated by HCAP and VDRF.  She then likely developed air embolism leading to cardiac arrest and respiratory failure after she removed her central line.  ASSESSMENT / PLAN:  PULMONARY A: Acute hypoxic respiratory failure after cardiac arrest and HCAP. P:   Pulmonary hygiene - IS, mobilize   CARDIOVASCULAR A:  Asystolic cardiac arrest after presumed air embolism 2nd to accidental removal of central line by patient with cardiogenic shock. Sepsis - in setting of C-diff  Acute systolic CHF after cardiac arrest. Acute on chronic diastolic CHF. Sinus bradycardia. Hx of LBBB. P:  SDU monitoring  Repeat lasix 8/27 Levophed for MAP > 65 Will eventually need to be assessed by the heart failure team   RENAL A:   Metabolic acidosis >> resolved. Hypokalemia, hypophosphatemia, hypomagnesemia. P:   Trend BMP / urinary output Replace electrolytes as indicated Avoid nephrotoxic agents, ensure adequate renal perfusion  GASTROINTESTINAL A:   Abdominal pain >> improved. Severe protein calorie malnutrition. Dysphagia. P:   SLP evaluation  NPO Insert small bore feeding tube for TF and medications   HEMATOLOGIC A:   Anemia of critical illness. P:  Trend CBC SQ Heparin for DVT prophylaxis   INFECTIOUS A:   C-Difficile Positive - 8/27 UTI >> resolved. HCAP. New fever 8/25 - C-diff positive  P: Discontinue IV abx 8/25  Begin oral vancomycin PT Monitor fever curve, WBC trend   ENDOCRINE A:   Hyperglycemia. P:   SSI   NEUROLOGIC A:   Acute metabolic encephalopathy. Hx of depression. P:   Continue citalopram    CC Time:  30 minutes   [name], NP-C [place] [facility] Pgr: [phone] or if no answer [phone] 06/28/2017, 10:01 AM

## 2017-06-28 NOTE — Care Management Note (Signed)
Case Management Note  Patient Details  Name: Veronia Laprise MRN: [id number] Date of Birth: [birth year]  Subjective/Objective:                  Pt is one day extubation/having freq. Stools-poss. c.diff  Action/Plan: Date:  June 28, 2017 Chart reviewed for concurrent status and case management needs. Will continue to follow patient progress. Discharge Planning: following for needs Expected discharge date: [id number] Velva Harman, BSN, [place], [place]  Expected Discharge Date:   (unknown)               Expected Discharge Plan:  Home/Self Care  In-House Referral:     Discharge planning Services  CM Consult  Post Acute Care Choice:    Choice offered to:     DME Arranged:    DME Agency:     HH Arranged:    HH Agency:     Status of Service:  In process, will continue to follow  If discussed at Long Length of Stay Meetings, dates discussed:    Additional Comments:  Leeroy Cha, RN 06/28/2017, 8:38 AM

## 2017-06-28 NOTE — Progress Notes (Signed)
[place] Physician Progress Note and Electrolyte Replacement  Patient Name: Christy [name] DOB: 22-Sep-1941 MRN: [id number]  Date of Service  06/28/2017   HPI/Events of Note    Recent Labs Lab 06/25/17 0451 06/25/17 1150 06/26/17 0300 06/26/17 1003 06/26/17 1655 06/27/17 0350 06/27/17 1643 06/28/17 0429  NA 140 140 143  --   --  139  --  139  K 3.1* 3.8 3.5  --   --  3.1*  --  2.8*  CL 99* 103 105  --   --  103  --  102  CO2 34* 28 29  --   --  29  --  31  GLUCOSE 91 245* 195*  --   --  261*  --  131*  BUN 13 14 17   --   --  25*  --  19  CREATININE 0.53 0.67 0.87  --   --  0.71  --  0.57  CALCIUM 7.5* 6.8* 7.4*  --   --  7.2*  --  6.6*  MG  --  1.4* 1.8 1.8 1.7 1.7 1.9 1.6*  PHOS  --   --  2.3* 2.3* 2.1* 1.9* 2.8  --     Recent Labs Lab 06/26/17 0300 06/27/17 0350 06/28/17 0429  HGB 9.6* 8.5* 7.9*  HCT 30.6* 26.4* 24.5*  WBC 11.2* 10.1 6.2  PLT 444* 386 322    No results for input(s): TROPONINI in the last 168 hours.    Estimated Creatinine Clearance: 69.7 mL/min (by C-G formula based on SCr of 0.57 mg/dL).  Intake/Output      08/26 0701 - 08/27 0700   I.V. (mL/kg) 532.2 (5.8)   NG/GT 340.1   IV Piggyback 300   Total Intake(mL/kg) 1172.2 (12.7)   Urine (mL/kg/hr) 3465 (1.6)   Total Output 3465   Net -2292.8        - I/O DETAILED x 24h    Total I/O In: 371.9 [I.V.:271.9; IV Piggyback:100] Out: 300 [Urine:300] - I/O THIS SHIFT    ASSESSMENT Low K Low mag  Makes good urine  eICURN Interventions  Replete K and mag   ASSESSMENT: MAJOR ELECTROLYTE      Dr. Brand Males, M.D., [facility].C.P Pulmonary and Critical Care Medicine Staff Physician [place] [facility] Pager: [phone], If no answer or between  15:00h - 7:00h: call [phone]  06/28/2017 5:45 AM

## 2017-06-28 NOTE — Progress Notes (Signed)
Daughter updated via phone on plan of care.  KUB verified (images personally reviewed) for feeding tube placement.     [name], NP-C [place] [facility] Pgr: [phone] or if no answer [phone] 06/28/2017, 1:38 PM

## 2017-06-28 NOTE — Progress Notes (Addendum)
Order for swallow evaluation received, given pt required intubation x2 during hospitalization and remained dysphonic after first extubation, will plan to see for MBS in lieu of clinical evaluation.  Per Rn, pt does not voice and is on vasopressors.  Small bore feeding tube placed today by nursing.  Derenda Fennel, [id number] RN, will phone next date to determine readiness for MBS. Thanks.        Otto Caraway, [facility] SLP [phone]

## 2017-06-28 NOTE — Progress Notes (Addendum)
Nutrition Follow-up  DOCUMENTATION CODES:   Severe malnutrition in context of acute illness/injury  INTERVENTION:  - Will order 30 mL Prostat TID with Osmolite 1.5 @ 10 mL/hr to advance by 10 mL every 24 hours to reach goal rate of Osmolite 1.5 @ 50 mL/hr. At goal rate, this regimen will provide 2100 kcal, 120 grams of protein (104% maximum estimated protein need), and 914 mL free water. - Will monitor if more rapid TF advancement is feasible. - Will order 30 mL free water QID (120 mL/day) for tube patency.  - Will monitor POC/GOC.   Monitor magnesium, potassium, and phosphorus daily for at least 3 days, MD to replete as needed, as pt is at risk for refeeding syndrome given current hypokalemia, hypomagnesemia with repletion ordered.   NUTRITION DIAGNOSIS:   Malnutrition (severe) related to acute illness, dysphagia as evidenced by percent weight loss, energy intake < or equal to 50% for > or equal to 5 days. -ongoing   GOAL:   Patient will meet greater than or equal to 90% of their needs -unmet with TF not yet re-started  MONITOR:   TF tolerance, Weight trends, Labs, Skin, I & O's  REASON FOR ASSESSMENT:   Consult Enteral/tube feeding initiation and management  ASSESSMENT:   76 y.o. female currently residing in a nursing home after deconditioning following a knee surgery in June. Recently found to have an ESBL Klebsiella pneumonia urinary tract infection. Treated briefly with Bactrim intravenously which was subsequently converted to oral. Initially the patient did improve then was noted to have decreased level of consciousness today and was hypotensive with systolic blood pressure in the 60-70s. Patient was found have a lactic acidosis as well as acute renal failure  8/27 Pt was extubated 8/26 and TF stopped prior to extubation. Consult for TF initiation and management received this AM. No NGT in at time of RD visit but RN placed [place] a short time after. Will order TF as outlined  above with slow advancement with concern for refeeding/exacerbating current refeeding syndrome. Weight has been fluctuating throughout hospitalization (186-206 lbs) and weight is current trending up since 8/23.   PCCM MD/NP note this AM states pt with C. Diff colitis (shown to be positive this AM) and anasarca.  Medications reviewed; 20 mg IV Pepcid/day, 40 mg IV Lasix BID, sliding scale Novolog, 10 mEq IV KCl x4 runs today, 40 mEq KCl per Panda x1 dose today, 30 mmol IV KPhos x1 run yesterday.  Labs reviewed; CBGs: 124 and 122 mg/dL this AM, K: 2.8 mmol/L, Mg: 1.6 mg/dL, Ca: 6.6 mg/dL.  IVF: LR @ 10 mL/hr.    8/26 - Pt was intubated 8/24. Pt pulled out a central line.  - TF began 8/25.  - Pt now extubated and no longer receiving Vital HP @ 35 ml/hr with 30 ml Prostat TID as of 0930. - Currently NPO, awaiting SLP evaluation.  - Previously on Dysphagia 2, nectar-thick liquids; she was not consuming much: ~25% of meals.  - Palliative following. Will monitor for any GOC decisions.  Low K, Phos  -showing some signs of refeeding syndrome Mg WNL   8/24 - Code Blue called late this AM s/p cardiac arrest.  - Pt now re-intubated and OGT in place.  - Estimated nutrition needs updated based on current weight of 84.8 kg which is -3.6 kg from yesterday..    Diet Order:  Diet NPO time specified  Skin:  Wound (see comment) (Stage 1 sacral and L heel pressure injuries)  Last BM:  8/27  Height:   Ht Readings from Last 1 Encounters:  06/25/17 5\' 7"  (1.702 m)    Weight:   Wt Readings from Last 1 Encounters:  06/28/17 203 lb 0.7 oz (92.1 kg)    Ideal Body Weight:  61.4 kg  BMI:  Body mass index is 31.8 kg/m.  Estimated Nutritional Needs:   Kcal:  1900-2100  Protein:  105-115g  Fluid:  1.8L/day  EDUCATION NEEDS:   Education needs no appropriate at this time    Jarome Matin, MS, RD, LDN, CNSC Inpatient Clinical Dietitian Pager # [phone] After hours/weekend pager #  [phone]

## 2017-06-29 ENCOUNTER — Inpatient Hospital Stay (HOSPITAL_COMMUNITY): Payer: Medicare Other

## 2017-06-29 LAB — MAGNESIUM
MAGNESIUM: 2.1 mg/dL (ref 1.7–2.4)
MAGNESIUM: 1.9 mg/dL (ref 1.7–2.4)

## 2017-06-29 LAB — CBC
HCT: 26.8 % — ABNORMAL LOW (ref 36.0–46.0)
HEMOGLOBIN: 8.4 g/dL — AB (ref 12.0–15.0)
MCH: 28.2 pg (ref 26.0–34.0)
MCHC: 31.3 g/dL (ref 30.0–36.0)
MCV: 89.9 fL (ref 78.0–100.0)
Platelets: 367 10*3/uL (ref 150–400)
RBC: 2.98 MIL/uL — ABNORMAL LOW (ref 3.87–5.11)
RDW: 15.4 % (ref 11.5–15.5)
WBC: 5.9 10*3/uL (ref 4.0–10.5)

## 2017-06-29 LAB — BASIC METABOLIC PANEL
Anion gap: 7 (ref 5–15)
BUN: 22 mg/dL — AB (ref 6–20)
CALCIUM: 7.6 mg/dL — AB (ref 8.9–10.3)
CO2: 34 mmol/L — AB (ref 22–32)
Chloride: 96 mmol/L — ABNORMAL LOW (ref 101–111)
Creatinine, Ser: 0.76 mg/dL (ref 0.44–1.00)
GFR calc Af Amer: >60 mL/min (ref >60)
GFR calc non Af Amer: >60 mL/min (ref >60)
GLUCOSE: 162 mg/dL — AB (ref 65–99)
Potassium: 3.4 mmol/L — ABNORMAL LOW (ref 3.5–5.1)
Sodium: 137 mmol/L (ref 135–145)

## 2017-06-29 LAB — GLUCOSE, CAPILLARY
GLUCOSE-CAPILLARY: 147 mg/dL — AB (ref 65–99)
GLUCOSE-CAPILLARY: 192 mg/dL — AB (ref 65–99)
Glucose-Capillary: 120 mg/dL — ABNORMAL HIGH (ref 65–99)
Glucose-Capillary: 171 mg/dL — ABNORMAL HIGH (ref 65–99)
Glucose-Capillary: 187 mg/dL — ABNORMAL HIGH (ref 65–99)
Glucose-Capillary: 212 mg/dL — ABNORMAL HIGH (ref 65–99)
Glucose-Capillary: 188 mg/dL — ABNORMAL HIGH (ref 65–99)

## 2017-06-29 LAB — PHOSPHORUS
PHOSPHORUS: 2.6 mg/dL (ref 2.5–4.6)
Phosphorus: 2.8 mg/dL (ref 2.5–4.6)

## 2017-06-29 LAB — CULTURE, RESPIRATORY: SPECIAL REQUESTS: NORMAL

## 2017-06-29 LAB — CULTURE, RESPIRATORY W GRAM STAIN

## 2017-06-29 MED ORDER — ASPIRIN 81 MG PO CHEW
81.0000 mg | CHEWABLE_TABLET | Freq: Every day | ORAL | Status: DC
  Administered 2017-06-29 – 2017-07-06 (×8): 81 mg via ORAL
  Filled 2017-06-29 (×8): qty 1

## 2017-06-29 MED ORDER — FUROSEMIDE 10 MG/ML IJ SOLN
40.0000 mg | Freq: Two times a day (BID) | INTRAMUSCULAR | Status: AC
  Administered 2017-06-29 (×2): 40 mg via INTRAVENOUS
  Filled 2017-06-29 (×2): qty 4

## 2017-06-29 MED ORDER — OSMOLITE 1.5 CAL PO LIQD
1000.0000 mL | ORAL | Status: DC
  Administered 2017-06-29 – 2017-06-30 (×2): 1000 mL
  Filled 2017-06-29 (×3): qty 1000

## 2017-06-29 NOTE — Progress Notes (Signed)
PULMONARY / CRITICAL CARE MEDICINE   Name: Christy [name] MRN: [id number] DOB: 01-21-1941    ADMISSION DATE:  06/15/2017  REFERRING MD:  Dr. Sheran Fava, [facility]  CHIEF COMPLAINT:  Bradycardia  BRIEF SUMMARY:   76 y/o female had knee surgery and was at SNF.  She was found to have weakness and RLQ discomfort.  She then had altered mental status, and was hypotensive.  She was tx for septic shock and UTI.  She developed respiratory failure from HCAP with concern for aspiration and required intubation.  She extubated herself, and was transferred to medical ward.  On 06/25/17 she was noted to have pulled her Rt IJ central line out.  She then developed bradycardia, hypotension, altered mental status and respiratory failure.  She required intubation by anesthesia.  After being transferred to ICU she developed asystole.  She required CPR.  She eventually regained pulse and blood pressure.  She also was able to follow simple commands.  PCCM asked to resume primary care while in ICU.  SUBJECTIVE:  More awake today AM. Still on levophed but it is weaning down. Started on tube feeds.   VITAL SIGNS: BP (!) 122/48 (BP Location: Left Arm)   Pulse 91   Temp 98.8 F (37.1 C)   Resp (!) 24   Ht 5\' 7"  (1.702 m)   Wt 88 kg (194 lb 0.1 oz)   SpO2 94%   BMI 30.39 kg/m   VENTILATOR SETTINGS:    INTAKE / OUTPUT: I/O last 3 completed shifts: In: 1567.7 [I.V.:736.5; NG/GT:131.2; IV Piggyback:700] Out: 2800 [Urine:2800]  PHYSICAL EXAMINATION: Gen:      No acute distress HEENT:  EOMI, sclera anicteric Neck:     No masses; no thyromegaly Lungs:    B/L rhonchi; normal respiratory effort CV:         Regular rate and rhythm; no murmurs Abd:      + bowel sounds; soft, non-tender; no palpable masses, no distension Ext:    No edema; adequate peripheral perfusion Skin:      Warm and dry; no rash Neuro: Asleep, awakens to command.   LABS:  BMET  Recent Labs Lab 06/27/17 0350 06/28/17 0429 06/29/17 0358   NA 139 139 137  K 3.1* 2.8* 3.4*  CL 103 102 96*  CO2 29 31 34*  BUN 25* 19 22*  CREATININE 0.71 0.57 0.76  GLUCOSE 261* 131* 162*    Electrolytes  Recent Labs Lab 06/27/17 0350  06/28/17 0429 06/28/17 1155 06/28/17 1809 06/29/17 0358  CALCIUM 7.2*  --  6.6*  --   --  7.6*  MG 1.7  < > 1.6* 2.6* 2.2 2.1  PHOS 1.9*  < >  --  2.7 3.0 2.6  < > = values in this interval not displayed.  CBC  Recent Labs Lab 06/27/17 0350 06/28/17 0429 06/29/17 0358  WBC 10.1 6.2 5.9  HGB 8.5* 7.9* 8.4*  HCT 26.4* 24.5* 26.8*  PLT 386 322 367    Coag's No results for input(s): APTT, INR in the last 168 hours.  Sepsis Markers No results for input(s): LATICACIDVEN, PROCALCITON, O2SATVEN in the last 168 hours.  ABG  Recent Labs Lab 06/25/17 1220  PHART 7.441  PCO2ART 43.7  PO2ART 155*    Liver Enzymes  Recent Labs Lab 06/25/17 1150  AST 63*  ALT 25  ALKPHOS 103  BILITOT 0.5  ALBUMIN 1.6*    Cardiac Enzymes No results for input(s): TROPONINI, PROBNP in the last 168 hours.  Glucose  Recent Labs Lab 06/28/17 1220 06/28/17 1533 06/28/17 2007 06/29/17 0106 06/29/17 0349 06/29/17 0749  GLUCAP 176* 155* 124* 120* 171* 147*    Imaging Dg Abd 1 View  Result Date: 06/28/2017 CLINICAL DATA:  Feeding tube placement. EXAM: ABDOMEN - 1 VIEW COMPARISON:  06/25/2017. FINDINGS: Feeding tube tip in the distal stomach. Normal bowel gas pattern. Right hip prosthesis. Old, healed right rib fractures. Mildly displaced left seventh lateral rib fracture. IMPRESSION: 1. Feeding tube tip in the distal stomach. 2. Mildly displaced left lateral seventh rib fracture. Electronically Signed   By: Claudie Revering M.D.   On: 06/28/2017 13:20   Dg Chest Port 1 View  Result Date: 06/29/2017 CLINICAL DATA:  Respiratory failure, hypoxia, sepsis, cardiopulmonary arrest with successful resuscitation. EXAM: PORTABLE CHEST 1 VIEW COMPARISON:  Portable chest x-ray of June 27, 2017 FINDINGS: The  lungs are less well inflated since extubation of the trachea. The interstitial markings are coarse. The retrocardiac region on the left is dense. Small amounts of pleural fluid blunt the costophrenic angles. The cardiac silhouette is enlarged. The pulmonary vascularity is engorged. There is calcification in the wall of the aortic arch. The left internal jugular venous catheter tip projects over the proximal to mid SVC. The feeding tube tip projects below the inferior margin of the image. IMPRESSION: CHF with mild interstitial edema and small bilateral pleural effusions. Left lower lobe atelectasis or pneumonia. Thoracic aortic atherosclerosis. Electronically Signed   By: David  Martinique M.D.   On: 06/29/2017 07:13    STUDIES:  Abd u/s 8/14 >> GB sludge, hepatic steatosis CT abd/pelvis 8/15 >> mild colon wall thickening Echo 8/15 >> mild LVH, EF 55 to 60% Echo 8/25 >> EF 20 to 25%  CULTURES: Urine 8/14 >> Klebsiella, Enterococcus Blood 8/14 >> negative Urine 8/24 >> negative Sputum 8/24 >>  Sputum 8/25 >> Blood 8/25 >>  C-Diff 8/26 >> positive antigen, PCR positive  ANTIBIOTICS: Vancomycin 8/15 >> 8/23 Meropenem 8/15 >> 8/25 Fortaz 8/25 >> 8/27 Vancomycin 8/25 >> 8/27  PO vanco 8/27 >>  SIGNIFICANT EVENTS: 8/14 Admit 8/15 Off pressors 8/19 ETT 8/20 Self extubated 8/24 Pulled out Rt IJ CVL >> cardiac arrest, back to ICU 8/25 New fever 8/26 Extubated 8/27 C-diff positive  LINES/TUBES: ETT 8/24 >> 8/26 Lt IJ CVL 8/24 >>   DISCUSSION: 76 yo female initially admitted for septic shock from UTI.  Course complicated by HCAP and VDRF.  She then likely developed air embolism leading to cardiac arrest and respiratory failure after she removed her central line.  ASSESSMENT / PLAN:  PULMONARY A: Acute hypoxic respiratory failure after cardiac arrest and HCAP. P:   Continue pulm hygiene IS as tolerated  CARDIOVASCULAR A:  Asystolic cardiac arrest after presumed air embolism 2nd to  accidental removal of central line by patient with cardiogenic shock. Sepsis - in setting of C-diff  Acute systolic CHF after cardiac arrest. Acute on chronic diastolic CHF. Sinus bradycardia. Hx of LBBB. P:  One more dose lasix today Wean levophed off Will eventually need to be assessed by the heart failure team   RENAL A:   Metabolic acidosis >> resolved. Hypokalemia, hypophosphatemia, hypomagnesemia. P:   Follow urine output and Cr Replete lytes  GASTROINTESTINAL A:   Abdominal pain >> improved. Severe protein calorie malnutrition. Dysphagia. P:   Keep NPO Continue tube feeds and medications via feeding tube Insert small bore feeding tube for TF and medications   HEMATOLOGIC A:   Anemia of critical illness. P:  Follow CBC SQ heparin  INFECTIOUS A:   C-Difficile Positive - 8/27 UTI >> resolved. HCAP. New fever 8/25 - C-diff positive  P: Discontinue IV abx 8/25  Now on oral vanco for C diff.  Monitor fever curve, WBC trend   ENDOCRINE A:   Hyperglycemia. P:   SSi coverage  NEUROLOGIC A:   Acute metabolic encephalopathy. Hx of depression. P:   Continue citalopram   Daughter updated at bedside 8/28  The patient is critically ill with multiple organ system failure and requires high complexity decision making for assessment and support, frequent evaluation and titration of therapies, advanced monitoring, review of radiographic studies and interpretation of complex data.   Critical Care Time devoted to patient care services, exclusive of separately billable procedures, described in this note is 35 minutes.   Marshell Garfinkel MD [place] [facility] Pager [phone] If no answer or after 3pm call: [phone] 06/29/2017, 9:29 AM

## 2017-06-29 NOTE — Progress Notes (Signed)
  Speech Language Pathology Treatment: Dysphagia  Patient Details Name: Christy [name] MRN: [id number] DOB: 1941-10-02 Today's Date: 06/29/2017 Time: 8242-3536 SLP Time Calculation (min) (ACUTE ONLY): 13 min  Assessment / Plan / Recommendation Clinical Impression  Treatment session focused on pt's appropriateness for MBS to evaluate current swallow abilities and make recommendations. [name] daughter translating during observation with ice cream which she orally transited without overt difficulties, suspect delayed initiation of swallow. Delayed and congested cough present. Pt ready for MBS scheduled for 11:00 today.    HPI HPI: 76 yo female adm to [facility] with AMS from SNF on 06/15/17.  Pt developed respiratory deficits and was intubated on 8/19- self extubated 06/21/17.     Pt with PMH + for former smoker, breast malignancy, knee surgery with post op deconditioning, UTI. Pt speaks [name]. Initial swallow assessment 06/22/17 started Dys 2, nectar, decreased in status and made NPO. Treatment today for pt appropriateness for MBS today.        SLP Plan  MBS       Recommendations  Diet recommendations: NPO Medication Administration: Via alternative means                Follow up Recommendations: Skilled Nursing facility SLP Visit Diagnosis: Dysphagia, oropharyngeal phase (R13.12) Plan: MBS       GO                [name] 06/29/2017, 10:07 AM  Orbie Pyo Colvin Caroli.[employer] [phone]

## 2017-06-29 NOTE — Progress Notes (Signed)
Nutrition Follow-up  DOCUMENTATION CODES:   Severe malnutrition in context of acute illness/injury  INTERVENTION:  - Continue 30 mL Prostat TID and 30 mL free water QID. - Will increase TF rate to Osmolite 1.5 @ 20 mL/hr.   Monitor magnesium, potassium, and phosphorus until TF at goal rate (Osmolite 1.2 @ 50 mL/hr), MD to replete as needed, as pt is at risk for refeeding syndrome given severe malnutrition, correct hypomagnesemia and improved hypokalemia.   NUTRITION DIAGNOSIS:   Malnutrition (severe) related to acute illness, dysphagia as evidenced by percent weight loss, energy intake < or equal to 50% for > or equal to 5 days. -ongoing  GOAL:   Patient will meet greater than or equal to 90% of their needs -unmet with current TF rate; will be met with TF rate at goal  MONITOR:   TF tolerance, Weight trends, Labs, Skin, I & O's  ASSESSMENT:   76 y.o. female currently residing in a nursing home after deconditioning following a knee surgery in June. Recently found to have an ESBL Klebsiella pneumonia urinary tract infection. Treated briefly with Bactrim intravenously which was subsequently converted to oral. Initially the patient did improve then was noted to have decreased level of consciousness today and was hypotensive with systolic blood pressure in the 60-70s. Patient was found have a lactic acidosis as well as acute renal failure  8/28 Panda placed via L nare yesterday and CXR results show tip of tube in the distal stomach. Pt currently receiving Osmolite 1.5 @ 10 mL/hr with 30 mL Prostat TID and 30 mL free water QID. This regimen is providing 660 kcal, 60 grams of protein, and 303 mL free water. Informed RN of plan to increase TF rate today. Weight trending back down (-9 lbs/4.1 kg) from yesterday. Dr. Matilde Bash note from this AM states plan for one more dose of Lasix today and plan for eventual assessment by heart failure team. SLP plans to do MBS when pt is appropriate for this  test.   Medications reviewed; 20 mg IV Pepcid once/day, 40 mg IV Lasix BID, sliding scale Novolog, 10 mEq IC KCl x4 runs yesterday, 40 mEq KCl per Panda x1 dose yesterday. Labs reviewed; CBGs: 120, 171, and 147 mg/dL this AM, K: 3.4 mmol/L, Cl: 96 mmol/L, BUN: 22 mg/dL, Ca: 7.6 mg/dL.   IVF: LR @ 10 mL/hr (240 mL/day).   8/27 - Pt was extubated 8/26 and TF stopped prior to extubation.  - Consult for TF initiation and management received this AM.  - No NGT in at time of RD visit but RN placed [facility] a short time after.  - Slow advancement for TF d/t concern for refeeding/exacerbating current refeeding syndrome.  - Weight has been fluctuating throughout hospitalization (186-206 lbs) and weight is current trending up since 8/23.  - PCCM MD/NP note this AM states pt with C. Diff colitis (shown to be positive this AM) and anasarca.  K: 2.8 mmol/L, Mg: 1.6 mg/dL IVF: LR @ 10 mL/hr.    8/26 - Pt was intubated 8/24. Pt pulled out a central line.  - TF began 8/25.  - Pt now extubated and no longer receiving Vital HP @ 35 ml/hr with 30 ml Prostat TID as of 0930. - Currently NPO, awaiting SLP evaluation.  - Previously on Dysphagia 2, nectar-thick liquids; she was not consuming much: ~25% of meals.  - Palliative following. Will monitor for any GOC decisions.  Low K, Phos -showing some signs of refeeding syndrome Mg WNL  Diet Order:  Diet NPO time specified  Skin:  Wound (see comment) (Stage 1 sacral and L heel pressure injuries)  Last BM:  8/27  Height:   Ht Readings from Last 1 Encounters:  06/25/17 '5\' 7"'  (1.702 m)    Weight:   Wt Readings from Last 1 Encounters:  06/29/17 194 lb 0.1 oz (88 kg)    Ideal Body Weight:  61.4 kg  BMI:  Body mass index is 30.39 kg/m.  Estimated Nutritional Needs:   Kcal:  1900-2100  Protein:  105-115g  Fluid:  1.8L/day  EDUCATION NEEDS:   Education needs no appropriate at this time    Jarome Matin, MS, RD, LDN,  CNSC Inpatient Clinical Dietitian Pager # [phone] After hours/weekend pager # [phone]

## 2017-06-29 NOTE — Progress Notes (Signed)
Modified Barium Swallow Progress Note  Patient Details  Name: Christy [name] MRN: [id number] Date of Birth: 1941-01-27  Today's Date: 06/29/2017  Modified Barium Swallow completed.  Full report located under Chart Review in the Imaging Section.  Brief recommendations include the following:  Clinical Impression  Moderately decreased oral manipulation including lingual pumping, lingual residue spilling over tonuge base and delayed transit. Pharyngeal phase mainly impacted by decreased sensation with slow swallow initiation at the valleculae and pyriform sinuses aiding in silent aspiration with cup sip nectar. Initially cued throat clear removed majority of material from larygneal vestibule but unsuccessful the remainder of study to clear vocal cords and laryngeal vestibule. Pt obviously fatigued and stating she was unable when asked (by daughter) to produce additional swallow or cough. Moderate vallecular and pyriform sinus retention not cleared. Aspiration risk is very high at this time with all consistencies. Recommend continue NPO with NGT, several ice chips PRN following oral care. Overall prognosis unknown to this SLP for potential for swallow improvement. She appears very deconditioned. Will follow along for plan re: nutrition; improvements with repeat MBS if/when appropriate versus comfort feeds.      Swallow Evaluation Recommendations       SLP Diet Recommendations: Ice chips PRN after oral care;NPO       Medication Administration: Via alternative means               Oral Care Recommendations: Oral care QID        [name] 06/29/2017,2:11 PM   Orbie Pyo [place].Ed [employer] [phone]

## 2017-06-30 LAB — BASIC METABOLIC PANEL
ANION GAP: 7 (ref 5–15)
Anion gap: 6 (ref 5–15)
Anion gap: 5 (ref 5–15)
BUN: 23 mg/dL — AB (ref 6–20)
BUN: 19 mg/dL (ref 6–20)
BUN: 20 mg/dL (ref 6–20)
CALCIUM: 6.6 mg/dL — AB (ref 8.9–10.3)
CHLORIDE: 106 mmol/L (ref 101–111)
CHLORIDE: 100 mmol/L — AB (ref 101–111)
CO2: 36 mmol/L — ABNORMAL HIGH (ref 22–32)
CO2: 30 mmol/L (ref 22–32)
CO2: 36 mmol/L — ABNORMAL HIGH (ref 22–32)
CREATININE: 0.6 mg/dL (ref 0.44–1.00)
Calcium: 7.6 mg/dL — ABNORMAL LOW (ref 8.9–10.3)
Calcium: 7.7 mg/dL — ABNORMAL LOW (ref 8.9–10.3)
Chloride: 96 mmol/L — ABNORMAL LOW (ref 101–111)
Creatinine, Ser: 0.69 mg/dL (ref 0.44–1.00)
Creatinine, Ser: 0.57 mg/dL (ref 0.44–1.00)
GFR calc Af Amer: >60 mL/min (ref >60)
GFR calc Af Amer: >60 mL/min (ref >60)
GFR calc Af Amer: >60 mL/min (ref >60)
GFR calc non Af Amer: >60 mL/min (ref >60)
GFR calc non Af Amer: >60 mL/min (ref >60)
GLUCOSE: 171 mg/dL — AB (ref 65–99)
GLUCOSE: 202 mg/dL — AB (ref 65–99)
Glucose, Bld: 183 mg/dL — ABNORMAL HIGH (ref 65–99)
POTASSIUM: 2.7 mmol/L — AB (ref 3.5–5.1)
POTASSIUM: 3 mmol/L — AB (ref 3.5–5.1)
Potassium: 3.5 mmol/L (ref 3.5–5.1)
SODIUM: 139 mmol/L (ref 135–145)
SODIUM: 141 mmol/L (ref 135–145)
Sodium: 142 mmol/L (ref 135–145)

## 2017-06-30 LAB — GLUCOSE, CAPILLARY
GLUCOSE-CAPILLARY: 172 mg/dL — AB (ref 65–99)
GLUCOSE-CAPILLARY: 187 mg/dL — AB (ref 65–99)
GLUCOSE-CAPILLARY: 198 mg/dL — AB (ref 65–99)
Glucose-Capillary: 157 mg/dL — ABNORMAL HIGH (ref 65–99)
Glucose-Capillary: 193 mg/dL — ABNORMAL HIGH (ref 65–99)
Glucose-Capillary: 188 mg/dL — ABNORMAL HIGH (ref 65–99)
Glucose-Capillary: 246 mg/dL — ABNORMAL HIGH (ref 65–99)

## 2017-06-30 LAB — CBC
HCT: 25.1 % — ABNORMAL LOW (ref 36.0–46.0)
Hemoglobin: 7.9 g/dL — ABNORMAL LOW (ref 12.0–15.0)
MCH: 28.8 pg (ref 26.0–34.0)
MCHC: 31.5 g/dL (ref 30.0–36.0)
MCV: 91.6 fL (ref 78.0–100.0)
PLATELETS: 356 10*3/uL (ref 150–400)
RBC: 2.74 MIL/uL — AB (ref 3.87–5.11)
RDW: 15.6 % — ABNORMAL HIGH (ref 11.5–15.5)
WBC: 7.5 10*3/uL (ref 4.0–10.5)

## 2017-06-30 LAB — PHOSPHORUS: Phosphorus: 2.7 mg/dL (ref 2.5–4.6)

## 2017-06-30 LAB — MAGNESIUM: MAGNESIUM: 1.7 mg/dL (ref 1.7–2.4)

## 2017-06-30 MED ORDER — POTASSIUM CHLORIDE 20 MEQ/15ML (10%) PO SOLN
40.0000 meq | ORAL | Status: AC
  Administered 2017-06-30 (×2): 40 meq
  Filled 2017-06-30 (×2): qty 30

## 2017-06-30 MED ORDER — PRO-STAT SUGAR FREE PO LIQD
60.0000 mL | Freq: Two times a day (BID) | ORAL | Status: DC
  Administered 2017-06-30 – 2017-07-05 (×11): 60 mL
  Filled 2017-06-30 (×11): qty 60

## 2017-06-30 MED ORDER — RANITIDINE HCL 150 MG/10ML PO SYRP
150.0000 mg | ORAL_SOLUTION | Freq: Two times a day (BID) | ORAL | Status: DC
  Administered 2017-06-30 – 2017-07-06 (×14): 150 mg
  Filled 2017-06-30 (×16): qty 10

## 2017-06-30 NOTE — Progress Notes (Signed)
[facility] ADULT ICU REPLACEMENT PROTOCOL FOR AM LAB REPLACEMENT ONLY  The patient does apply for the [facility] Adult ICU Electrolyte Replacment Protocol based on the criteria listed below:   1. Is GFR >/= 40 ml/min? Yes.    Patient's GFR today is >60 2. Is urine output >/= 0.5 ml/kg/hr for the last 6 hours? Yes.   Patient's UOP is 0.96 ml/kg/hr 3. Is BUN < 60 mg/dL? Yes.    Patient's BUN today is 23 4. Abnormal electrolyte(s): 2.7 5. Ordered repletion with: per protocol 6. If a panic level lab has been reported, has the CCM MD in charge been notified? Yes.  .   Physician:  Dr. Delene Loll, Philis Nettle 06/30/2017 5:36 AM

## 2017-06-30 NOTE — Progress Notes (Signed)
PULMONARY / CRITICAL CARE MEDICINE   Name: Christy [name] MRN: [id number] DOB: [birth year]    ADMISSION DATE:  06/15/2017  REFERRING MD:  Dr. Sheran Fava, [facility]  CHIEF COMPLAINT:  Bradycardia  BRIEF SUMMARY:   76 y/o female had knee surgery and was at SNF.  She was found to have weakness and RLQ discomfort.  She then had altered mental status, and was hypotensive.  She was tx for septic shock and UTI.  She developed respiratory failure from HCAP with concern for aspiration and required intubation.  She extubated herself, and was transferred to medical ward.  On 06/25/17 she was noted to have pulled her Rt IJ central line out.  She then developed bradycardia, hypotension, altered mental status and respiratory failure.  She required intubation by anesthesia.  After being transferred to ICU she developed asystole.  She required CPR.  She eventually regained pulse and blood pressure.  She also was able to follow simple commands.  PCCM asked to resume primary care while in ICU.  SUBJECTIVE:   Remains on levophed. Abd/CW paradoxus noted when asleep  VITAL SIGNS: BP (!) 128/52 (BP Location: Left Arm)   Pulse 79   Temp 98.2 F (36.8 C)   Resp (!) 23   Ht 5\' 7"  (1.702 m)   Wt 190 lb 14.7 oz (86.6 kg)   SpO2 91%   BMI 29.90 kg/m   VENTILATOR SETTINGS:    INTAKE / OUTPUT: I/O last 3 completed shifts: In: 2482 [I.V.:660.8; NG/GT:636.2; IV Piggyback:50] Out: 2540 [Urine:2540]  PHYSICAL EXAMINATION: General:  Frail, elderly female poorly responsive HEENT: MM pink/moist, no jvd/lan NOI:BBCW affect Neuro: mae x4, language barrier  CV: s1s2 rrr, HSD PULM: Abd chest wall paradoxus noted UG:QBVQ, non-tender, bsx4 active  Extremities: warm/dry,+edema  Skin: no rashes or lesions    LABS:  BMET  Recent Labs Lab 06/28/17 0429 06/29/17 0358 06/30/17 0404  NA 139 137 139  K 2.8* 3.4* 2.7*  CL 102 96* 96*  CO2 31 34* 36*  BUN 19 22* 23*  CREATININE 0.57 0.76 0.69  GLUCOSE 131* 162* 183*     Electrolytes  Recent Labs Lab 06/28/17 0429  06/29/17 0358 06/29/17 1752 06/30/17 0404  CALCIUM 6.6*  --  7.6*  --  7.6*  MG 1.6*  < > 2.1 1.9 1.7  PHOS  --   < > 2.6 2.8 2.7  < > = values in this interval not displayed.  CBC  Recent Labs Lab 06/28/17 0429 06/29/17 0358 06/30/17 0404  WBC 6.2 5.9 7.5  HGB 7.9* 8.4* 7.9*  HCT 24.5* 26.8* 25.1*  PLT 322 367 356    Coag's No results for input(s): APTT, INR in the last 168 hours.  Sepsis Markers No results for input(s): LATICACIDVEN, PROCALCITON, O2SATVEN in the last 168 hours.  ABG  Recent Labs Lab 06/25/17 1220  PHART 7.441  PCO2ART 43.7  PO2ART 155*    Liver Enzymes  Recent Labs Lab 06/25/17 1150  AST 63*  ALT 25  ALKPHOS 103  BILITOT 0.5  ALBUMIN 1.6*    Cardiac Enzymes No results for input(s): TROPONINI, PROBNP in the last 168 hours.  Glucose  Recent Labs Lab 06/29/17 1231 06/29/17 1542 06/29/17 1926 06/29/17 2319 06/30/17 0328 06/30/17 0802  GLUCAP 192* 187* 212* 188* 172* 187*    Imaging Dg Swallowing Func-speech Pathology  Result Date: 06/29/2017 Objective Swallowing Evaluation: Type of Study: MBS-Modified Barium Swallow Study Patient Details Name: Christy [name] MRN: [id number] Date of Birth: [birth year] Today's Date: 06/29/2017  Time: SLP Start Time (ACUTE ONLY): 1203-SLP Stop Time (ACUTE ONLY): 1223 SLP Time Calculation (min) (ACUTE ONLY): 20 min Past Medical History: Past Medical History: Diagnosis Date . Abnormal ECG   Inferior Q waves . Cancer ([place])   stage II right breast cancer . Cataract  . Diabetes mellitus  . Hypercholesterolemia  . Hyperlipidemia  . Hypertension  . Osteoporosis  . Overactive bladder  Past Surgical History: Past Surgical History: Procedure Laterality Date . BREAST SURGERY    right lumpectomy, sentinel node biopsy . CATARACT EXTRACTION Bilateral 2008 . FRACTURE SURGERY    ORIF right distal radius fx . JOINT REPLACEMENT    right hip . ORIF RADIAL FRACTURE  Bilateral 03/29/2017  Procedure: OPEN REDUCTION INTERNAL FIXATION (ORIF) RADIAL FRACTURES;  Surgeon: Iran Planas, MD;  Location: [place];  Service: Orthopedics;  Laterality: Bilateral; . PATELLECTOMY Left 03/31/2017  Procedure: LEFT PARTIAL PATELLECTOMY;  Surgeon: Leandrew Koyanagi, MD;  Location: [place];  Service: Orthopedics;  Laterality: Left; . PORT-A-CATH REMOVAL  09/11/2011  Procedure: REMOVAL PORT-A-CATH;  Surgeon: Rolm Bookbinder, MD;  Location: [place];  Service: General;  Laterality: Left;  local  . PORTACATH PLACEMENT   HPI: 76 yo female adm to [facility] with AMS from SNF on 06/15/17.  Pt developed respiratory deficits and was intubated on 8/19- self extubated 06/21/17. Pt with PMH + for former smoker, breast malignancy, knee surgery with post op deconditioning, UTI. Pt speaks [name]. Initial swallow assessment 06/22/17 started Dys 2, nectar, decreased in status and made NPO after ICU transfer and asystole. Pt appropriate for MBS.   Subjective: pt awake in bed Assessment / Plan / Recommendation CHL IP CLINICAL IMPRESSIONS 06/29/2017 Clinical Impression Moderately decreased oral manipulation including lingual pumping, lingual residue spilling over tonuge base and delayed transit. Pharyngeal phase mainly impacted by decreased sensation with slow swallow initiation at the valleculae and pyriform sinuses aiding in silent aspiration with cup sip nectar. Initially cued throat clear removed majority of material from larygneal vestibule but unsuccessful the remainder of study to clear vocal cords and laryngeal vestibule. Pt obviously fatigued and stating she was unable when asked (by daughter) to produce additional swallow or cough. Moderate vallecular and pyriform sinus retention not cleared. Aspiration risk is very high at this time with all consistencies. Recommend continue NPO with NGT, several ice chips PRN following oral care. Overall prognosis unknown to this SLP for potential for swallow improvement.  She appears very deconditioned. Will follow along for plan re: nutrition; improvements with repeat MBS if/when appropriate versus comfort feeds.    SLP Visit Diagnosis Dysphagia, oropharyngeal phase (R13.12) Attention and concentration deficit following -- Frontal lobe and executive function deficit following -- Impact on safety and function (No Data)   CHL IP TREATMENT RECOMMENDATION 06/29/2017 Treatment Recommendations Therapy as outlined in treatment plan below   Prognosis 06/29/2017 Prognosis for Safe Diet Advancement Fair Barriers to Reach Goals -- Barriers/Prognosis Comment -- CHL IP DIET RECOMMENDATION 06/29/2017 SLP Diet Recommendations Ice chips PRN after oral care;NPO Liquid Administration via -- Medication Administration Via alternative means Compensations -- Postural Changes --   CHL IP OTHER RECOMMENDATIONS 06/29/2017 Recommended Consults -- Oral Care Recommendations Oral care QID Other Recommendations --   CHL IP FOLLOW UP RECOMMENDATIONS 06/29/2017 Follow up Recommendations Skilled Nursing facility   [facility] IP FREQUENCY AND DURATION 06/29/2017 Speech Therapy Frequency (ACUTE ONLY) min 1 x/week Treatment Duration 2 weeks      CHL IP ORAL PHASE 06/29/2017 Oral Phase Impaired Oral - Pudding Teaspoon --  Oral - Pudding Cup -- Oral - Honey Teaspoon Delayed oral transit Oral - Honey Cup Delayed oral transit Oral - Nectar Teaspoon Delayed oral transit;Lingual/palatal residue Oral - Nectar Cup Lingual/palatal residue;Delayed oral transit;Lingual pumping Oral - Nectar Straw -- Oral - Thin Teaspoon -- Oral - Thin Cup -- Oral - Thin Straw -- Oral - Puree Delayed oral transit Oral - Mech Soft -- Oral - Regular -- Oral - Multi-Consistency -- Oral - Pill -- Oral Phase - Comment --  CHL IP PHARYNGEAL PHASE 06/29/2017 Pharyngeal Phase Impaired Pharyngeal- Pudding Teaspoon -- Pharyngeal -- Pharyngeal- Pudding Cup -- Pharyngeal -- Pharyngeal- Honey Teaspoon -- Pharyngeal -- Pharyngeal- Honey Cup -- Pharyngeal -- Pharyngeal- Nectar  Teaspoon Delayed swallow initiation-vallecula;Pharyngeal residue - pyriform;Pharyngeal residue - valleculae Pharyngeal Material does not enter airway Pharyngeal- Nectar Cup Penetration/Aspiration during swallow;Delayed swallow initiation-vallecula;Delayed swallow initiation-pyriform sinuses;Pharyngeal residue - valleculae;Pharyngeal residue - pyriform;Reduced epiglottic inversion Pharyngeal Material enters airway, passes BELOW cords without attempt by patient to eject out (silent aspiration) Pharyngeal- Nectar Straw -- Pharyngeal -- Pharyngeal- Thin Teaspoon -- Pharyngeal -- Pharyngeal- Thin Cup -- Pharyngeal -- Pharyngeal- Thin Straw -- Pharyngeal -- Pharyngeal- Puree Pharyngeal residue - pyriform;Pharyngeal residue - valleculae;Reduced epiglottic inversion Pharyngeal -- Pharyngeal- Mechanical Soft -- Pharyngeal -- Pharyngeal- Regular -- Pharyngeal -- Pharyngeal- Multi-consistency -- Pharyngeal -- Pharyngeal- Pill -- Pharyngeal -- Pharyngeal Comment --  CHL IP CERVICAL ESOPHAGEAL PHASE 06/29/2017 Cervical Esophageal Phase WFL Pudding Teaspoon -- Pudding Cup -- Honey Teaspoon -- Honey Cup -- Nectar Teaspoon -- Nectar Cup -- Nectar Straw -- Thin Teaspoon -- Thin Cup -- Thin Straw -- Puree -- Mechanical Soft -- Regular -- Multi-consistency -- Pill -- Cervical Esophageal Comment -- No flowsheet data found. [name] 06/29/2017, 2:10 PM  Orbie Pyo [place].Ed CCC-SLP Pager [phone]              STUDIES:  Abd u/s 8/14 >> GB sludge, hepatic steatosis CT abd/pelvis 8/15 >> mild colon wall thickening Echo 8/15 >> mild LVH, EF 55 to 60% Echo 8/25 >> EF 20 to 25%  CULTURES: Urine 8/14 >> Klebsiella, Enterococcus Blood 8/14 >> negative Urine 8/24 >> negative Sputum 8/24 >>  Sputum 8/25 >> Blood 8/25 >>  C-Diff 8/26 >> positive antigen, PCR positive  ANTIBIOTICS: Vancomycin 8/15 >> 8/23 Meropenem 8/15 >> 8/25 Fortaz 8/25 >> 8/27 Vancomycin 8/25 >> 8/27  PO vanco 8/27 >>  SIGNIFICANT  EVENTS: 8/14 Admit 8/15 Off pressors 8/19 ETT 8/20 Self extubated 8/24 Pulled out Rt IJ CVL >> cardiac arrest, back to ICU 8/25 New fever 8/26 Extubated 8/27 C-diff positive 8/29 remains on pressors  LINES/TUBES: ETT 8/24 >> 8/26 Lt IJ CVL 8/24 >>   DISCUSSION: 76 yo female initially admitted for septic shock from UTI.  Course complicated by HCAP and VDRF.  She then likely developed air embolism leading to cardiac arrest and respiratory failure after she removed her central line.  ASSESSMENT / PLAN:  PULMONARY A: Acute hypoxic respiratory failure after cardiac arrest and HCAP. P:   Continue pulm hygiene IS as tolerated  CARDIOVASCULAR A:  Asystolic cardiac arrest after presumed air embolism 2nd to accidental removal of central line by patient with cardiogenic shock. Sepsis - in setting of C-diff  Acute systolic CHF after cardiac arrest. Acute on chronic diastolic CHF. Sinus bradycardia. Hx of LBBB. P:  8/28 lasix (suspect diuresis is impedes weaning of pressors Wean levophed off as tolerated Will eventually need to be assessed by the heart failure team   RENAL Lab  Results  Component Value Date   CREATININE 0.69 06/30/2017   CREATININE 0.76 06/29/2017   CREATININE 0.57 06/28/2017   CREATININE 1.07 (H) 01/06/2017   CREATININE 1.2 (H) 08/03/2016   CREATININE 1.8 (H) 10/02/2015   CREATININE 1.8 (H) 07/31/2014    Recent Labs Lab 06/28/17 0429 06/29/17 0358 06/30/17 0404  K 2.8* 3.4* 2.7*     A:   Metabolic acidosis >> resolved. Hypokalemia, hypophosphatemia, hypomagnesemia. P:   Follow urine output and Cr Replete lytes  GASTROINTESTINAL A:   Abdominal pain >> improved. Severe protein calorie malnutrition. Dysphagia. P:   Keep NPO Continue tube feeds and medications via feeding tube Insert small bore feeding tube for TF and medications   HEMATOLOGIC  Recent Labs  06/29/17 0358 06/30/17 0404  HGB 8.4* 7.9*    A:   Anemia of critical  illness. P:  Follow CBC SQ heparin  INFECTIOUS A:   C-Difficile Positive - 8/27 UTI >> resolved. HCAP. New fever 8/25 - C-diff positive  P: Discontinue IV abx 8/25  Now on oral vanco for C diff. Started 8/27>> Monitor fever curve, WBC trend   ENDOCRINE CBG (last 3)   Recent Labs  06/29/17 2319 06/30/17 0328 06/30/17 0802  GLUCAP 188* 172* 187*     A:   Hyperglycemia. P:   SSi coverage  NEUROLOGIC A:   Acute metabolic encephalopathy. Hx of depression. P:   Continue citalopram   Daughter updated at bedside 8/28 8/29 no family at bedside  APP CCT 30 min   Richardson Landry Minor ACNP Maryanna Shape PCCM Pager [phone] till 3 pm If no answer page [phone] 06/30/2017, 10:01 AM

## 2017-06-30 NOTE — Progress Notes (Signed)
Nutrition Follow-up  DOCUMENTATION CODES:   Severe malnutrition in context of acute illness/injury  INTERVENTION:  - Continue Osmolite 1.5 @ 20 mL/hr will increase Prostat to 60 mL BID in order to meet 86% minimum estimated protein need.  - Will d/c free water flush.   Monitor magnesium, potassium, and phosphorus daily until TF at goal rate of Osmolite 1.5 @ 50 mL/hr, MD to replete as needed, as pt is at risk for refeeding syndrome given persistent hypokalemia.   NUTRITION DIAGNOSIS:   Malnutrition (severe) related to acute illness, dysphagia as evidenced by percent weight loss, energy intake < or equal to 50% for > or equal to 5 days. -ongoing  GOAL:   Patient will meet greater than or equal to 90% of their needs -unmet with current TF rate   MONITOR:   TF tolerance, Weight trends, Labs, Skin, I & O's  ASSESSMENT:   76 y.o. female currently residing in a nursing home after deconditioning following a knee surgery in June. Recently found to have an ESBL Klebsiella pneumonia urinary tract infection. Treated briefly with Bactrim intravenously which was subsequently converted to oral. Initially the patient did improve then was noted to have decreased level of consciousness today and was hypotensive with systolic blood pressure in the 60-70s. Patient was found have a lactic acidosis as well as acute renal failure  8/29 Pt with Panda in place and is receiving Osmolite 1.5 @ 20 mL/hr with 30 mL Prostat TID and 30 mL free water QID. This regimen is providing 1020 kcal, 75 grams of protein, and 486 mL free water. Adjustments outlined above will change regimen to provide 1120 kcal, 90 grams of protein and 366 mL free water. Will maintain current rate of Osmolite 1.5 d/t persistent hypokalemia (trending down from yesterday) and Mg and Phos on the low end of normal ranges.   RN assessments have been indicating pt with deep, pitting edema to all extremities. Weight -4 lbs/1.4 kg from yesterday;  yesterday's weight was consistent with admission weight. MBS done yesterday and SLP recommends NPO, PRN ice chips following oral care.  Medications reviewed; 20 mg IV Pepcid BID, 40 mg IV Lasix BID, sliding scale Novolog, 40 mEq KCl per Panda x2 doses today.  Labs reviewed; CBG: 172 mg/d this AM, K: 2.7 mmol/L, Cl: 96 mmol/L, BUN: 23 mg/dL, Ca: 7.6 mg/dL.  IVF: LR @ 10 mL/hr (240 mL/day).   8/28 - Panda placed via L nare yesterday and CXR results show tip of tube in the distal stomach.  - Pt currently receiving Osmolite 1.5 @ 10 mL/hr with 30 mL Prostat TID and 30 mL free water QID. - This regimen is providing 660 kcal, 60 grams of protein, and 303 mL free water. - Informed RN of plan to increase TF rate today.  - Weight trending back down (-9 lbs/4.1 kg) from yesterday.  - Dr. Matilde Bash note from this AM states plan for one more dose of Lasix today and plan for eventual assessment by heart failure team.  - SLP plans to do MBS when pt is appropriate for this test.   10 mEq IC KCl x4 runs yesterday, 40 mEq KCl per Panda x1 dose yesterday. K: 3.4 mmol/L IVF: LR @ 10 mL/hr (240 mL/day).   8/27 - Pt was extubated 8/26 and TF stopped prior to extubation.  - Consult for TF initiation and management received this AM.  - No NGT in at time of RD visit but RN placed [place] a short time after.  -  Slow advancement for TF d/t concern for refeeding/exacerbating current refeeding syndrome.  - Weight has been fluctuating throughout hospitalization (186-206 lbs) and weight is current trending up since 8/23.  - PCCM MD/NP note this AM states pt with C. Diff colitis (shown to be positive this AM) and anasarca.  K: 2.8 mmol/L, Mg: 1.6 mg/dL IVF: LR @ 10 mL/hr.    Diet Order:  Diet NPO time specified  Skin:  Wound (see comment) (Stage 1 sacral and L heel pressure injuries)  Last BM:  8/27  Height:   Ht Readings from Last 1 Encounters:  06/25/17 5\' 7"  (1.702 m)    Weight:   Wt Readings  from Last 1 Encounters:  06/30/17 190 lb 14.7 oz (86.6 kg)    Ideal Body Weight:  61.4 kg  BMI:  Body mass index is 29.9 kg/m.  Estimated Nutritional Needs:   Kcal:  1900-2100  Protein:  105-115g  Fluid:  1.8L/day  EDUCATION NEEDS:   Education needs no appropriate at this time    Jarome Matin, MS, RD, LDN, CNSC Inpatient Clinical Dietitian Pager # [phone] After hours/weekend pager # [phone]

## 2017-07-01 ENCOUNTER — Inpatient Hospital Stay (HOSPITAL_COMMUNITY): Payer: Medicare Other

## 2017-07-01 LAB — CULTURE, BLOOD (ROUTINE X 2)
CULTURE: NO GROWTH
CULTURE: NO GROWTH
CULTURE: NO GROWTH
Culture: NO GROWTH
SPECIAL REQUESTS: ADEQUATE
SPECIAL REQUESTS: ADEQUATE
Special Requests: ADEQUATE
Special Requests: ADEQUATE

## 2017-07-01 LAB — BASIC METABOLIC PANEL
Anion gap: 6 (ref 5–15)
BUN: 24 mg/dL — AB (ref 6–20)
CALCIUM: 8 mg/dL — AB (ref 8.9–10.3)
CO2: 35 mmol/L — ABNORMAL HIGH (ref 22–32)
CREATININE: 0.66 mg/dL (ref 0.44–1.00)
Chloride: 99 mmol/L — ABNORMAL LOW (ref 101–111)
GFR calc non Af Amer: >60 mL/min (ref >60)
Glucose, Bld: 290 mg/dL — ABNORMAL HIGH (ref 65–99)
Potassium: 3.5 mmol/L (ref 3.5–5.1)
SODIUM: 140 mmol/L (ref 135–145)

## 2017-07-01 LAB — CBC
HCT: 25.8 % — ABNORMAL LOW (ref 36.0–46.0)
Hemoglobin: 8 g/dL — ABNORMAL LOW (ref 12.0–15.0)
MCH: 28.9 pg (ref 26.0–34.0)
MCHC: 31 g/dL (ref 30.0–36.0)
MCV: 93.1 fL (ref 78.0–100.0)
PLATELETS: 341 10*3/uL (ref 150–400)
RBC: 2.77 MIL/uL — AB (ref 3.87–5.11)
RDW: 15.7 % — AB (ref 11.5–15.5)
WBC: 5.6 10*3/uL (ref 4.0–10.5)

## 2017-07-01 LAB — GLUCOSE, CAPILLARY
GLUCOSE-CAPILLARY: 246 mg/dL — AB (ref 65–99)
GLUCOSE-CAPILLARY: 198 mg/dL — AB (ref 65–99)
GLUCOSE-CAPILLARY: 201 mg/dL — AB (ref 65–99)
GLUCOSE-CAPILLARY: 161 mg/dL — AB (ref 65–99)
Glucose-Capillary: 195 mg/dL — ABNORMAL HIGH (ref 65–99)

## 2017-07-01 LAB — MAGNESIUM: MAGNESIUM: 1.7 mg/dL (ref 1.7–2.4)

## 2017-07-01 LAB — PHOSPHORUS: Phosphorus: 2.1 mg/dL — ABNORMAL LOW (ref 2.5–4.6)

## 2017-07-01 LAB — CORTISOL-PM, BLOOD: Cortisol - PM: 18.9 ug/dL — ABNORMAL HIGH (ref <10.0)

## 2017-07-01 MED ORDER — OSMOLITE 1.5 CAL PO LIQD
1000.0000 mL | ORAL | Status: DC
  Administered 2017-07-01 – 2017-07-04 (×4): 1000 mL
  Filled 2017-07-01 (×5): qty 1000

## 2017-07-01 NOTE — Care Management Note (Signed)
Case Management Note  Patient Details  Name: Christy [name] MRN: [id number] Date of Birth: 1941/06/16  Subjective/Objective:                  Hypotension requiring iv levophed  Action/Plan: Date:  July 01, 2017 Chart reviewed for concurrent status and case management needs. Will continue to follow patient progress. Discharge Planning: following for needs Expected discharge date: [id number] Velva Harman, BSN, [place], [place]  Expected Discharge Date:   (unknown)               Expected Discharge Plan:  Home/Self Care  In-House Referral:     Discharge planning Services  CM Consult  Post Acute Care Choice:    Choice offered to:     DME Arranged:    DME Agency:     HH Arranged:    [place] Agency:     Status of Service:  In process, will continue to follow  If discussed at Long Length of Stay Meetings, dates discussed:    Additional Comments:  Leeroy Cha, RN 07/01/2017, 9:03 AM

## 2017-07-01 NOTE — Progress Notes (Signed)
SLP Cancellation Note  Patient Details Name: Christy [name] MRN: [id number] DOB: [birth year]   Cancelled treatment:       Reason Eval/Treat Not Completed: Medical issues which prohibited therapy (pt remains on pressors at this time, will continue effort)   Luanna Salk, [facility] SLP [phone]

## 2017-07-01 NOTE — Progress Notes (Signed)
Nutrition Follow-up  DOCUMENTATION CODES:   Severe malnutrition in context of acute illness/injury  INTERVENTION:  - Will increase Osmolite 1.5 to 30 mL/hr and continue 60 mL Prostat BID. This regimen will provide 1480 kcal (78% minimum estimated kcal need), 105 grams of protein (100% estimated protein need), and 549 mL free water.  Monitor magnesium, potassium, and phosphorus daily until Osmolite 1.5 @ 50 mL/hr (goal rate), MD to replete as needed, as pt is at risk for refeeding syndrome given severe malnutrition, resolved hypokalemia with current hypophosphatemia.   NUTRITION DIAGNOSIS:   Malnutrition (severe) related to acute illness, dysphagia as evidenced by percent weight loss, energy intake < or equal to 50% for > or equal to 5 days. -ongoing  GOAL:   Patient will meet greater than or equal to 90% of their needs -unmet at this time  MONITOR:   TF tolerance, Weight trends, Labs, Skin, I & O's  ASSESSMENT:   76 y.o. female currently residing in a nursing home after deconditioning following a knee surgery in June. Recently found to have an ESBL Klebsiella pneumonia urinary tract infection. Treated briefly with Bactrim intravenously which was subsequently converted to oral. Initially the patient did improve then was noted to have decreased level of consciousness today and was hypotensive with systolic blood pressure in the 60-70s. Patient was found have a lactic acidosis as well as acute renal failure  8/30 Pt with Panda and is receiving Osmolite 1.5 @ 20 mL/hr with 60 mL Prostat BID. Current weight -2 lbs (1 kg) from yesterday and is -6 lbs (2.4 kg) compared to admission weight. Hypokalemia is resolved at this time and pt now with hypophosphatemia. Will increase TF rate with close monitoring of K, Mg, and Phos trends.   Medications reviewed; sliding scale Novolog.  Labs reviewed; CBGs: 246 and 198 mg/dL this AM, Cl: 99 mmol/L, BUN: 24 mg/dL, Ca: 8 mg/dL, Phos: 2.1 mg/dL.  IVF:  LR @ 10 mL/hr (240 mL/day).   8/29 - Pt with Panda in place and is receiving Osmolite 1.5 @ 20 mL/hr with 30 mL Prostat TID and 30 mL free water QID.  - This regimen is providing 1020 kcal, 75 grams of protein, and 486 mL free water.  - Will d/c free water and increase Prostat to 60 mL BID - Will maintain current rate of Osmolite 1.5 d/t persistent hypokalemia (trending down from yesterday) and Mg and Phos on the low end of normal ranges.  - RN assessments have been indicating pt with deep, pitting edema to all extremities.  - Weight -4 lbs/1.4 kg from yesterday; yesterday's weight was consistent with admission weight.  - MBS done yesterday and SLP recommends NPO, PRN ice chips following oral care.  K: 2.7 mmol/L IVF: LR @ 10 mL/hr (240 mL/day).   8/28 - Panda placed via L nare yesterday and CXR results show tip of tube in the distal stomach.  - Pt currently receiving Osmolite 1.5 @ 10 mL/hr with 30 mL Prostat TID and 30 mL free water QID. - This regimen is providing 660 kcal, 60 grams of protein, and 303 mL free water. - Informed RN of plan to increase TF rate today.  - Weight trending back down (-9 lbs/4.1 kg) from yesterday.  - Dr. Matilde Bash note from this AM states plan for one more dose of Lasix today and plan for eventual assessment by heart failure team.  - SLP plans to do MBS when pt is appropriate for this test.   10 mEq  IC KCl x4 runs yesterday, 40 mEq KCl per Panda x1 dose yesterday. K: 3.4 mmol/L IVF: LR @ 10 mL/hr (240 mL/day).   Diet Order:  Diet NPO time specified  Skin:  Wound (see comment) (Stage 1 sacral and L heel pressure injuries)  Last BM:  8/30  Height:   Ht Readings from Last 1 Encounters:  06/25/17 5\' 7"  (1.702 m)    Weight:   Wt Readings from Last 1 Encounters:  07/01/17 188 lb 11.4 oz (85.6 kg)    Ideal Body Weight:  61.4 kg  BMI:  Body mass index is 29.56 kg/m.  Estimated Nutritional Needs:   Kcal:  1900-2100  Protein:   105-115g  Fluid:  1.8L/day  EDUCATION NEEDS:   Education needs no appropriate at this time    Jarome Matin, MS, RD, LDN, CNSC Inpatient Clinical Dietitian Pager # [phone] After hours/weekend pager # [phone]

## 2017-07-01 NOTE — Progress Notes (Signed)
PULMONARY / CRITICAL CARE MEDICINE   Name: Christy [name] MRN: [id number] DOB: 1941-01-29    ADMISSION DATE:  06/15/2017  REFERRING MD:  Dr. Sheran Fava, [facility]  CHIEF COMPLAINT:  Bradycardia  BRIEF SUMMARY:   76 y/o female had knee surgery and was at SNF.  She was found to have weakness and RLQ discomfort.  She then had altered mental status, and was hypotensive.  She was tx for septic shock and UTI.  She developed respiratory failure from HCAP with concern for aspiration and required intubation.  She extubated herself, and was transferred to medical ward.  On 06/25/17 she was noted to have pulled her Rt IJ central line out.  She then developed bradycardia, hypotension, altered mental status and respiratory failure.  She required intubation by anesthesia.  After being transferred to ICU she developed asystole.  She required CPR.  She eventually regained pulse and blood pressure.  She also was able to follow simple commands.  PCCM asked to resume primary care while in ICU.  SUBJECTIVE:   Remains on levophed. Abd/CW paradoxus noted when asleep. Will check cortisol level 8/30  VITAL SIGNS: BP (!) 126/48 (BP Location: Left Arm)   Pulse 86   Temp 99.1 F (37.3 C)   Resp (!) 23   Ht 5\' 7"  (1.702 m)   Wt 188 lb 11.4 oz (85.6 kg)   SpO2 94%   BMI 29.56 kg/m   VENTILATOR SETTINGS:    INTAKE / OUTPUT: I/O last 3 completed shifts: In: 1192.4 [I.V.:492.4; NG/GT:700] Out: 1125 [Urine:1125]  PHYSICAL EXAMINATION: General:  Elderly female HEENT: dry oral mucosa EGB:TDVV affect Neuro: poorly resonsive CV: hsr rrr PULM: even/non-labored, lungs bilaterally DECREASED OH:YWVP, non-tender, bsx4 active  Extremities: warm/dry, + edema  Skin: no rashes or lesions     LABS:  BMET  Recent Labs Lab 06/30/17 1632 06/30/17 2014 07/01/17 0352  NA 142 141 140  K 3.0* 3.5 3.5  CL 106 100* 99*  CO2 30 36* 35*  BUN 19 20 24*  CREATININE 0.57 0.60 0.66  GLUCOSE 171* 202* 290*     Electrolytes  Recent Labs Lab 06/29/17 1752 06/30/17 0404 06/30/17 1632 06/30/17 2014 07/01/17 0352  CALCIUM  --  7.6* 6.6* 7.7* 8.0*  MG 1.9 1.7  --   --  1.7  PHOS 2.8 2.7  --   --  2.1*    CBC  Recent Labs Lab 06/29/17 0358 06/30/17 0404 07/01/17 0352  WBC 5.9 7.5 5.6  HGB 8.4* 7.9* 8.0*  HCT 26.8* 25.1* 25.8*  PLT 367 356 341    Coag's No results for input(s): APTT, INR in the last 168 hours.  Sepsis Markers No results for input(s): LATICACIDVEN, PROCALCITON, O2SATVEN in the last 168 hours.  ABG  Recent Labs Lab 06/25/17 1220  PHART 7.441  PCO2ART 43.7  PO2ART 155*    Liver Enzymes  Recent Labs Lab 06/25/17 1150  AST 63*  ALT 25  ALKPHOS 103  BILITOT 0.5  ALBUMIN 1.6*    Cardiac Enzymes No results for input(s): TROPONINI, PROBNP in the last 168 hours.  Glucose  Recent Labs Lab 06/30/17 1655 06/30/17 1927 06/30/17 2200 06/30/17 2311 07/01/17 0334 07/01/17 0804  GLUCAP 198* 193* 188* 246* 246* 198*    Imaging Dg Chest Port 1 View  Result Date: 07/01/2017 CLINICAL DATA:  Respiratory failure EXAM: PORTABLE CHEST 1 VIEW COMPARISON:  06/29/2017 FINDINGS: Support devices are stable. Cardiomegaly. Small bilateral pleural effusions, left greater than right. Bibasilar atelectasis. Mild vascular congestion. IMPRESSION:  Continued small effusions with bibasilar atelectasis and vascular congestion. No real change. Electronically Signed   By: Rolm Baptise M.D.   On: 07/01/2017 07:10    STUDIES:  Abd u/s 8/14 >> GB sludge, hepatic steatosis CT abd/pelvis 8/15 >> mild colon wall thickening Echo 8/15 >> mild LVH, EF 55 to 60% Echo 8/25 >> EF 20 to 25%  CULTURES: Urine 8/14 >> Klebsiella, Enterococcus Blood 8/14 >> negative Urine 8/24 >> negative Sputum 8/24 >>  Sputum 8/25 >> Blood 8/25 >>  C-Diff 8/26 >> positive antigen, PCR positive  ANTIBIOTICS: Vancomycin 8/15 >> 8/23 Meropenem 8/15 >> 8/25 Fortaz 8/25 >> 8/27 Vancomycin  8/25 >> 8/27  PO vanco 8/27 >>  SIGNIFICANT EVENTS: 8/14 Admit 8/15 Off pressors 8/19 ETT 8/20 Self extubated 8/24 Pulled out Rt IJ CVL >> cardiac arrest, back to ICU 8/25 New fever 8/26 Extubated 8/27 C-diff positive 8/30 remains on pressors  LINES/TUBES: ETT 8/24 >> 8/26 Lt IJ CVL 8/24 >>   DISCUSSION: 76 yo female initially admitted for septic shock from UTI.  Course complicated by HCAP and VDRF.  She then likely developed air embolism leading to cardiac arrest and respiratory failure after she removed her central line.  ASSESSMENT / PLAN:  PULMONARY A: Acute hypoxic respiratory failure after cardiac arrest and HCAP. P:   Continue pulm hygiene IS as tolerated  CARDIOVASCULAR A:  Asystolic cardiac arrest after presumed air embolism 2nd to accidental removal of central line by patient with cardiogenic shock. Sepsis - in setting of C-diff  Acute systolic CHF after cardiac arrest. Acute on chronic diastolic CHF. Sinus bradycardia. Hx of LBBB. P:  8/28 lasix (suspect diuresis is impedes weaning of pressors Wean levophed off as tolerated Will eventually need to be assessed by the heart failure team  8/30 check cortisol level  RENAL Lab Results  Component Value Date   CREATININE 0.66 07/01/2017   CREATININE 0.60 06/30/2017   CREATININE 0.57 06/30/2017   CREATININE 1.07 (H) 01/06/2017   CREATININE 1.2 (H) 08/03/2016   CREATININE 1.8 (H) 10/02/2015   CREATININE 1.8 (H) 07/31/2014    Recent Labs Lab 06/30/17 1632 06/30/17 2014 07/01/17 0352  K 3.0* 3.5 3.5     A:   Metabolic acidosis >> resolved. Hypokalemia, hypophosphatemia, hypomagnesemia. P:   Follow urine output and Cr Replete lytes  GASTROINTESTINAL A:   Abdominal pain >> improved. Severe protein calorie malnutrition. Dysphagia. P:   Keep NPO Continue tube feeds and medications via feeding tube Insert small bore feeding tube for TF and medications   HEMATOLOGIC  Recent Labs   06/30/17 0404 07/01/17 0352  HGB 7.9* 8.0*    A:   Anemia of critical illness. P:  Follow CBC SQ heparin  INFECTIOUS A:   C-Difficile Positive - 8/27 UTI >> resolved. HCAP. New fever 8/25 - C-diff positive  P: Discontinue IV abx 8/25  Now on oral vanco for C diff. Started 8/27>> Monitor fever curve, WBC trend   ENDOCRINE CBG (last 3)   Recent Labs  06/30/17 2311 07/01/17 0334 07/01/17 0804  GLUCAP 246* 246* 198*     A:   Hyperglycemia. P:   SSi coverage  NEUROLOGIC A:   Acute metabolic encephalopathy. Hx of depression. P:   Continue citalopram   Daughter updated at bedside 8/28 8/30 no family at bedside  APP CCT 30 min   Richardson Landry Minor ACNP [facility] PCCM Pager [phone] till 3 pm If no answer page [phone] 07/01/2017, 9:35 AM

## 2017-07-02 ENCOUNTER — Inpatient Hospital Stay (HOSPITAL_COMMUNITY): Payer: Medicare Other | Admitting: Certified Registered Nurse Anesthetist

## 2017-07-02 ENCOUNTER — Inpatient Hospital Stay (HOSPITAL_COMMUNITY): Payer: Medicare Other

## 2017-07-02 LAB — BASIC METABOLIC PANEL
Anion gap: 5 (ref 5–15)
BUN: 29 mg/dL — ABNORMAL HIGH (ref 6–20)
CHLORIDE: 102 mmol/L (ref 101–111)
CO2: 36 mmol/L — AB (ref 22–32)
CREATININE: 0.62 mg/dL (ref 0.44–1.00)
Calcium: 8.2 mg/dL — ABNORMAL LOW (ref 8.9–10.3)
GFR calc Af Amer: >60 mL/min (ref >60)
GFR calc non Af Amer: >60 mL/min (ref >60)
GLUCOSE: 196 mg/dL — AB (ref 65–99)
Potassium: 3 mmol/L — ABNORMAL LOW (ref 3.5–5.1)
Sodium: 143 mmol/L (ref 135–145)

## 2017-07-02 LAB — BLOOD GAS, ARTERIAL
ACID-BASE EXCESS: 8.8 mmol/L — AB (ref 0.0–2.0)
BICARBONATE: 32.9 mmol/L — AB (ref 20.0–28.0)
Drawn by: 331471
FIO2: 100
O2 Saturation: 98.2 %
PATIENT TEMPERATURE: 98.6
PEEP/CPAP: 5 cmH2O
PO2 ART: 107 mmHg (ref 83.0–108.0)
RATE: 18 resp/min
VT: 490 mL
pCO2 arterial: 43.8 mmHg (ref 32.0–48.0)
pH, Arterial: 7.488 — ABNORMAL HIGH (ref 7.350–7.450)

## 2017-07-02 LAB — GLUCOSE, CAPILLARY
GLUCOSE-CAPILLARY: 188 mg/dL — AB (ref 65–99)
GLUCOSE-CAPILLARY: 185 mg/dL — AB (ref 65–99)
Glucose-Capillary: 150 mg/dL — ABNORMAL HIGH (ref 65–99)
Glucose-Capillary: 183 mg/dL — ABNORMAL HIGH (ref 65–99)
Glucose-Capillary: 214 mg/dL — ABNORMAL HIGH (ref 65–99)
Glucose-Capillary: 263 mg/dL — ABNORMAL HIGH (ref 65–99)
Glucose-Capillary: 300 mg/dL — ABNORMAL HIGH (ref 65–99)

## 2017-07-02 LAB — CBC
HEMATOCRIT: 26.1 % — AB (ref 36.0–46.0)
Hemoglobin: 8 g/dL — ABNORMAL LOW (ref 12.0–15.0)
MCH: 29.2 pg (ref 26.0–34.0)
MCHC: 30.7 g/dL (ref 30.0–36.0)
MCV: 95.3 fL (ref 78.0–100.0)
Platelets: 317 10*3/uL (ref 150–400)
RBC: 2.74 MIL/uL — ABNORMAL LOW (ref 3.87–5.11)
RDW: 16.2 % — ABNORMAL HIGH (ref 11.5–15.5)
WBC: 5.6 10*3/uL (ref 4.0–10.5)

## 2017-07-02 MED ORDER — POTASSIUM CHLORIDE 20 MEQ/15ML (10%) PO SOLN
40.0000 meq | Freq: Once | ORAL | Status: AC
  Administered 2017-07-02: 40 meq
  Filled 2017-07-02: qty 30

## 2017-07-02 MED ORDER — FENTANYL CITRATE (PF) 100 MCG/2ML IJ SOLN
50.0000 ug | INTRAMUSCULAR | Status: DC | PRN
Start: 2017-07-02 — End: 2017-07-06
  Administered 2017-07-03 – 2017-07-04 (×4): 50 ug via INTRAVENOUS
  Filled 2017-07-02 (×5): qty 2

## 2017-07-02 MED ORDER — ETOMIDATE 2 MG/ML IV SOLN
INTRAVENOUS | Status: DC | PRN
  Administered 2017-07-02: 12 mg via INTRAVENOUS

## 2017-07-02 MED ORDER — CITALOPRAM HYDROBROMIDE 20 MG PO TABS
20.0000 mg | ORAL_TABLET | Freq: Every day | ORAL | Status: DC
  Administered 2017-07-03 – 2017-07-06 (×4): 20 mg
  Filled 2017-07-02 (×4): qty 1

## 2017-07-02 MED ORDER — NOREPINEPHRINE BITARTRATE 1 MG/ML IV SOLN
0.0000 ug/min | INTRAVENOUS | Status: DC
  Administered 2017-07-02: 10 ug/min via INTRAVENOUS
  Administered 2017-07-03: 3 ug/min via INTRAVENOUS
  Administered 2017-07-05: 2 ug/min via INTRAVENOUS
  Filled 2017-07-02 (×4): qty 4

## 2017-07-02 MED ORDER — ACETAMINOPHEN 160 MG/5ML PO SOLN
650.0000 mg | Freq: Four times a day (QID) | ORAL | Status: DC | PRN
  Administered 2017-07-03 (×2): 650 mg
  Filled 2017-07-02 (×2): qty 20.3

## 2017-07-02 MED ORDER — CHLORHEXIDINE GLUCONATE 0.12% ORAL RINSE (MEDLINE KIT)
15.0000 mL | Freq: Two times a day (BID) | OROMUCOSAL | Status: DC
  Administered 2017-07-02 – 2017-07-06 (×9): 15 mL via OROMUCOSAL

## 2017-07-02 MED ORDER — ORAL CARE MOUTH RINSE
15.0000 mL | Freq: Four times a day (QID) | OROMUCOSAL | Status: DC
  Administered 2017-07-02 – 2017-07-06 (×15): 15 mL via OROMUCOSAL

## 2017-07-02 MED ORDER — AMIODARONE HCL IN DEXTROSE 360-4.14 MG/200ML-% IV SOLN
60.0000 mg/h | INTRAVENOUS | Status: DC
  Filled 2017-07-02: qty 200

## 2017-07-02 MED ORDER — AMIODARONE HCL IN DEXTROSE 360-4.14 MG/200ML-% IV SOLN: 30.0000 mg/h | INTRAVENOUS | Status: DC

## 2017-07-02 MED ORDER — SUCCINYLCHOLINE CHLORIDE 20 MG/ML IJ SOLN
INTRAMUSCULAR | Status: DC | PRN
  Administered 2017-07-02: 120 mg via INTRAVENOUS

## 2017-07-02 MED ORDER — MIDAZOLAM HCL 2 MG/2ML IJ SOLN
1.0000 mg | INTRAMUSCULAR | Status: DC | PRN
Start: 2017-07-02 — End: 2017-07-06

## 2017-07-02 NOTE — Anesthesia Procedure Notes (Addendum)
Procedure Name: Intubation Date/Time: 07/02/2017 1:28 PM Performed by: West Pugh Pre-anesthesia Checklist: Patient identified, Emergency Drugs available, Suction available, Patient being monitored and Timeout performed Patient Re-evaluated:Patient Re-evaluated prior to induction Oxygen Delivery Method: Circle system utilized Preoxygenation: Pre-oxygenation with 100% oxygen Induction Type: IV induction, Rapid sequence and Cricoid Pressure applied Laryngoscope Size: 3 and Glidescope Tube type: Subglottic suction tube Tube size: 7.5 mm Number of attempts: 1 Airway Equipment and Method: Video-laryngoscopy and Stylet Placement Confirmation: ETT inserted through vocal cords under direct vision,  positive ETCO2,  CO2 detector and breath sounds checked- equal and bilateral Secured at: 24 cm Tube secured with: Tape Dental Injury: Teeth and Oropharynx as per pre-operative assessment  Comments: Lots of hardened secretion located in the glottic area, suction utilized. Glidescope electively utilized.

## 2017-07-02 NOTE — Progress Notes (Signed)
PULMONARY / CRITICAL CARE MEDICINE   Name: Christy [name] MRN: [id number] DOB: 10-07-1941    ADMISSION DATE:  06/15/2017  REFERRING MD:  Dr. Sheran Fava, [facility]  CHIEF COMPLAINT:  Bradycardia  BRIEF SUMMARY:   76 y/o female had knee surgery and was at SNF.  She was found to have weakness and RLQ discomfort.  She then had altered mental status, and was hypotensive.  She was tx for septic shock and UTI.  She developed respiratory failure from HCAP with concern for aspiration and required intubation.  She extubated herself, and was transferred to medical ward.  On 06/25/17 she was noted to have pulled her Rt IJ central line out.  She then developed bradycardia, hypotension, altered mental status and respiratory failure.  She required intubation by anesthesia.  After being transferred to ICU she developed asystole.  She required CPR.  She eventually regained pulse and blood pressure.  She also was able to follow simple commands.  PCCM asked to resume primary care while in ICU.  SUBJECTIVE:   Remains on levophed. More awake.  VITAL SIGNS: BP (!) 118/51   Pulse 83   Temp 98.8 F (37.1 C)   Resp (!) 24   Ht 5\' 7"  (1.702 m)   Wt 190 lb 0.6 oz (86.2 kg)   SpO2 97%   BMI 29.76 kg/m   VENTILATOR SETTINGS:    INTAKE / OUTPUT: I/O last 3 completed shifts: In: 1008.4 [I.V.:360.5; NG/GT:647.8] Out: 990 [Urine:990]  PHYSICAL EXAMINATION: General:  Elderly female HEENT: dry oral mucosa ZHY:QMVH affect Neuro: tracks and follows CV: HSD RRR PULM: even/non-labored, lungs bilaterally decreased QI:ONGE, non-tender, bsx4 active  Extremities: warm/dry, + edema  Skin: stage sacral decubitus      LABS:  BMET  Recent Labs Lab 06/30/17 2014 07/01/17 0352 07/02/17 0420  NA 141 140 143  K 3.5 3.5 3.0*  CL 100* 99* 102  CO2 36* 35* 36*  BUN 20 24* 29*  CREATININE 0.60 0.66 0.62  GLUCOSE 202* 290* 196*    Electrolytes  Recent Labs Lab 06/29/17 1752 06/30/17 0404  06/30/17 2014  07/01/17 0352 07/02/17 0420  CALCIUM  --  7.6*  < > 7.7* 8.0* 8.2*  MG 1.9 1.7  --   --  1.7  --   PHOS 2.8 2.7  --   --  2.1*  --   < > = values in this interval not displayed.  CBC  Recent Labs Lab 06/30/17 0404 07/01/17 0352 07/02/17 0420  WBC 7.5 5.6 5.6  HGB 7.9* 8.0* 8.0*  HCT 25.1* 25.8* 26.1*  PLT 356 341 317    Coag's No results for input(s): APTT, INR in the last 168 hours.  Sepsis Markers No results for input(s): LATICACIDVEN, PROCALCITON, O2SATVEN in the last 168 hours.  ABG  Recent Labs Lab 06/25/17 1220  PHART 7.441  PCO2ART 43.7  PO2ART 155*    Liver Enzymes  Recent Labs Lab 06/25/17 1150  AST 63*  ALT 25  ALKPHOS 103  BILITOT 0.5  ALBUMIN 1.6*    Cardiac Enzymes No results for input(s): TROPONINI, PROBNP in the last 168 hours.  Glucose  Recent Labs Lab 07/01/17 1139 07/01/17 1654 07/01/17 2009 07/02/17 0006 07/02/17 0348 07/02/17 0803  GLUCAP 195* 201* 161* 183* 188* 185*    Imaging Dg Chest Port 1 View  Result Date: 07/02/2017 CLINICAL DATA:  Respiratory failure. EXAM: PORTABLE CHEST 1 VIEW COMPARISON:  05/01/2017. FINDINGS: Left IJ line and feeding tube in stable position. Stable cardiomegaly and  mild interstitial prominence. Faint density projected over the right upper lung may represent a fluid pseudotumor. Progressive left-sided pleural effusion. No pneumothorax. Stable splenic artery vascular calcification. Surgical clips are noted over the right chest. IMPRESSION: 1. Lines and tubes in stable position. 2. Persistent cardiomegaly with bilateral interstitial prominence and left-sided pleural effusion suggesting CHF. Left-sided pleural effusion has increased from prior exam. Fluid pseudotumor in the right major fissure cannot be excluded. Electronically Signed   By: Marcello Moores  Register   On: 07/02/2017 06:58    STUDIES:  Abd u/s 8/14 >> GB sludge, hepatic steatosis CT abd/pelvis 8/15 >> mild colon wall thickening Echo 8/15 >>  mild LVH, EF 55 to 60% Echo 8/25 >> EF 20 to 25%  CULTURES: Urine 8/14 >> Klebsiella, Enterococcus Blood 8/14 >> negative Urine 8/24 >> negative Sputum 8/24 >>  Sputum 8/25 >>few candida  Blood 8/25 >> few candida guilliefermondii   C-Diff 8/26 >> positive antigen, PCR positive  ANTIBIOTICS: Vancomycin 8/15 >> 8/23 Meropenem 8/15 >> 8/25 Fortaz 8/25 >> 8/27 Vancomycin 8/25 >> 8/27  PO vanco 8/27 >>  SIGNIFICANT EVENTS: 8/14 Admit 8/15 Off pressors 8/19 ETT 8/20 Self extubated 8/24 Pulled out Rt IJ CVL >> cardiac arrest, back to ICU 8/25 New fever 8/26 Extubated 8/27 C-diff positive 8/30 remains on pressors 8/31 pressors turned off briefly  LINES/TUBES: ETT 8/24 >> 8/26 Lt IJ CVL 8/24 >>   DISCUSSION: 76 yo female initially admitted for septic shock from UTI.  Course complicated by HCAP and VDRF.  She then likely developed air embolism leading to cardiac arrest and respiratory failure after she removed her central line.  ASSESSMENT / PLAN:  PULMONARY A: Acute hypoxic respiratory failure after cardiac arrest and HCAP. P:   Continue pulm hygiene IS as tolerated  CARDIOVASCULAR A:  Asystolic cardiac arrest after presumed air embolism 2nd to accidental removal of central line by patient with cardiogenic shock. Sepsis - in setting of C-diff  Acute systolic CHF after cardiac arrest. Acute on chronic diastolic CHF. Sinus bradycardia. Hx of LBBB. P:  8/28 lasix (suspect diuresis is impedes weaning of pressors Wean levophed off as tolerated Will eventually need to be assessed by the heart failure team  8/30 check cortisol level and it was adequate  RENAL Lab Results  Component Value Date   CREATININE 0.62 07/02/2017   CREATININE 0.66 07/01/2017   CREATININE 0.60 06/30/2017   CREATININE 1.07 (H) 01/06/2017   CREATININE 1.2 (H) 08/03/2016   CREATININE 1.8 (H) 10/02/2015   CREATININE 1.8 (H) 07/31/2014    Recent Labs Lab 06/30/17 2014 07/01/17 0352  07/02/17 0420  K 3.5 3.5 3.0*     A:   Metabolic acidosis >> resolved. Hypokalemia, hypophosphatemia, hypomagnesemia. P:   Follow urine output and Cr Replete lytes as needed  GASTROINTESTINAL A:   Abdominal pain >> improved. Severe protein calorie malnutrition. Dysphagia. P:   Keep NPO Continue tube feeds and medications via feeding tube Insert small bore feeding tube for TF and medications   HEMATOLOGIC  Recent Labs  07/01/17 0352 07/02/17 0420  HGB 8.0* 8.0*    A:   Anemia of critical illness. P:  Follow CBC SQ heparin  INFECTIOUS A:   C-Difficile Positive - 8/27 UTI >> resolved. HCAP. New fever 8/25 - C-diff positive  P: Discontinue IV abx 8/25  Now on oral vanco for C diff. Started 8/27>> Monitor fever curve, WBC trend   ENDOCRINE CBG (last 3)   Recent Labs  07/02/17 0006 07/02/17 0348  07/02/17 0803  GLUCAP 183* 188* 185*     A:   Hyperglycemia. P:   SSi coverage  NEUROLOGIC A:   Acute metabolic encephalopathy. Hx of depression. P:   Continue citalopram   Daughter updated at bedside 8/31   APP CCT 30 min   Richardson Landry Domenick Quebedeaux ACNP [facility] PCCM Pager [phone] till 3 pm If no answer page [phone] 07/02/2017, 9:32 AM

## 2017-07-02 NOTE — Progress Notes (Signed)
[facility] Progress Note Patient Name: Christy [name] DOB: [birth year] MRN: [id number]   Date of Service  07/02/2017  HPI/Events of Note  Nurse reports frequent diarrhea with C. difficile.   eICU Interventions  Rectal fecal management system ordered.      Intervention Category Intermediate Interventions: Other:  Tera Partridge 07/02/2017, 8:06 PM

## 2017-07-02 NOTE — Progress Notes (Signed)
[facility] Physician Progress Note and Electrolyte Replacement  Patient Name: Christy [name] DOB: December 09, 1940 MRN: [id number]  Date of Service  07/02/2017   HPI/Events of Note    Recent Labs Lab 06/28/17 1809 06/29/17 0358 06/29/17 1752 06/30/17 0404 06/30/17 1632 06/30/17 2014 07/01/17 0352 07/02/17 0420  NA  --  137  --  139 142 141 140 143  K  --  3.4*  --  2.7* 3.0* 3.5 3.5 3.0*  CL  --  96*  --  96* 106 100* 99* 102  CO2  --  34*  --  36* 30 36* 35* 36*  GLUCOSE  --  162*  --  183* 171* 202* 290* 196*  BUN  --  22*  --  23* 19 20 24* 29*  CREATININE  --  0.76  --  0.69 0.57 0.60 0.66 0.62  CALCIUM  --  7.6*  --  7.6* 6.6* 7.7* 8.0* 8.2*  MG 2.2 2.1 1.9 1.7  --   --  1.7  --   PHOS 3.0 2.6 2.8 2.7  --   --  2.1*  --     Estimated Creatinine Clearance: 67.4 mL/min (by C-G formula based on SCr of 0.62 mg/dL).  Intake/Output      08/30 0701 - 08/31 0700   I.V. (mL/kg) 147.8 (1.7)   NG/GT 287.8   Total Intake(mL/kg) 435.7 (5.1)   Urine (mL/kg/hr) 715 (0.3)   Total Output 715   Net -279.3       Stool Occurrence 3 x    - I/O DETAILED x 24h    Total I/O In: -  Out: 350 [Urine:350] - I/O THIS SHIFT    ASSESSMENT Low k  eICURN Interventions  Replete via tube   ASSESSMENT: MAJOR ELECTROLYTE      Dr. Brand Males, M.D., [facility].C.P Pulmonary and Critical Care Medicine Staff Physician [facility] Pager: [phone], If no answer or between  15:00h - 7:00h: call [phone]  07/02/2017 6:48 AM

## 2017-07-02 NOTE — Progress Notes (Addendum)
Discovered patient unresponsive with O2 sats in the 40's at 1305.  Non rebreather placed on patient and RT called. CCM called and family spoke to who decided to keep patient a full code. Anesthesia called to intubate.

## 2017-07-02 NOTE — Progress Notes (Signed)
Nutrition Follow-up  DOCUMENTATION CODES:   Severe malnutrition in context of acute illness/injury  INTERVENTION:  - Continue Osmolite 1.5 @ 30 mL/hr with 60 mL Prostat BID.  Monitor magnesium, potassium, and phosphorus daily until TF at goal rate (Osmolite 1.5 @ 50 mL/hr), MD to replete as needed, as pt is at risk for refeeding syndrome given persistent hypokalemia.   NUTRITION DIAGNOSIS:   Malnutrition (severe) related to acute illness, dysphagia as evidenced by percent weight loss, energy intake < or equal to 50% for > or equal to 5 days. -ongoing  GOAL:   Patient will meet greater than or equal to 90% of their needs -unmet with TF not yet at goal  MONITOR:   TF tolerance, Weight trends, Labs, Skin, I & O's  ASSESSMENT:   76 y.o. female currently residing in a nursing home after deconditioning following a knee surgery in June. Recently found to have an ESBL Klebsiella pneumonia urinary tract infection. Treated briefly with Bactrim intravenously which was subsequently converted to oral. Initially the patient did improve then was noted to have decreased level of consciousness today and was hypotensive with systolic blood pressure in the 60-70s. Patient was found have a lactic acidosis as well as acute renal failure  8/31 Pt receiving Osmolite 1.5 @ 30 mL/hr with 60 mL Prostat BID which is providing 1480 kcal (78% minimum estimated kcal need), 105 grams of protein (100% estimated protein need), and 549 mL free water. Will maintain this rate as pt hypokalemic again today; no Mg or Phos labs but suspect they are likely low as well given Phos yesterday and Mg of 1.7 mg/dL yesterday. Goal rate for TF is Osmolite 1.5 @ 50 mL/hr with 30 mL Prostat TID which will provide 2100 kcal, 120 grams of protein (104% maximum estimated protein need), and 914 mL free water.   Pt with C. Diff and anasarca. Per PCCM: "Will eventually need to be assessed by the heart failure team." Will continue to monitor  weight trends, especially once able to increase TF rate.  Medications reviewed; sliding scale Novolog, 40 mEq KCl per Panda x1 dose today.  Labs reviewed; CBGs: 183, 188, and 185 mg/dL today, K: 3 mmol/L, BUN: 29 mg/dL, Ca: 8.2 mg/dL. IVF:LR @ 10 mL/hr (240 mL/day).   8/30 - Pt with Panda and is receiving Osmolite 1.5 @ 20 mL/hr with 60 mL Prostat BID.  - Current weight -2 lbs (1 kg) from yesterday and is -6 lbs (2.4 kg) compared to admission weight.  - Hypokalemia is resolved at this time and pt now with hypophosphatemia.  - Will increase TF rate with close monitoring of K, Mg, and Phos trends.   Phos: 2.1 mg/dL. IVF:LR @ 10 mL/hr (240 mL/day).   8/29 - Pt with Panda in place and is receiving Osmolite 1.5 @ 20 mL/hr with 30 mL Prostat TID and 30 mL free water QID.  - This regimen is providing 1020 kcal, 75 grams of protein, and 486 mL free water.  - Will d/c free water and increase Prostat to 60 mL BID - Will maintain current rate of Osmolite 1.5 d/t persistent hypokalemia (trending down from yesterday) and Mg and Phos on the low end of normal ranges.  - RN assessments have been indicating pt with deep, pitting edema to all extremities.  - Weight -4 lbs/1.4 kg from yesterday; yesterday's weight was consistent with admission weight.  - MBS done yesterday and SLP recommends NPO, PRN ice chips following oral care.  K: 2.7  mmol/L IVF:LR @ 10 mL/hr (240 mL/day).    Diet Order:  Diet NPO time specified  Skin:  Wound (see comment) (Stage 1 sacral and L heel pressure injuries)  Last BM:  8/31  Height:   Ht Readings from Last 1 Encounters:  06/25/17 5\' 7"  (1.702 m)    Weight:   Wt Readings from Last 1 Encounters:  07/02/17 190 lb 0.6 oz (86.2 kg)    Ideal Body Weight:  61.4 kg  BMI:  Body mass index is 29.76 kg/m.  Estimated Nutritional Needs:   Kcal:  1900-2100  Protein:  105-115g  Fluid:  1.8L/day  EDUCATION NEEDS:   Education needs no appropriate at  this time    Jarome Matin, MS, RD, LDN, CNSC Inpatient Clinical Dietitian Pager # [phone] After hours/weekend pager # [phone]

## 2017-07-02 NOTE — Progress Notes (Signed)
  Amiodarone Drug - Drug Interaction Consult Note  Recommendations: Celexa can increase risk for QTc prolongation.  Monitor electrolytes closely.  Amiodarone is metabolized by the cytochrome P450 system and therefore has the potential to cause many drug interactions. Amiodarone has an average plasma half-life of 50 days (range 20 to 100 days).   There is potential for drug interactions to occur several weeks or months after stopping treatment and the onset of drug interactions may be slow after initiating amiodarone.   []  Statins: Increased risk of myopathy. Simvastatin- restrict dose to 20mg  daily. Other statins: counsel patients to report any muscle pain or weakness immediately.  []  Anticoagulants: Amiodarone can increase anticoagulant effect. Consider warfarin dose reduction. Patients should be monitored closely and the dose of anticoagulant altered accordingly, remembering that amiodarone levels take several weeks to stabilize.  []  Antiepileptics: Amiodarone can increase plasma concentration of phenytoin, the dose should be reduced. Note that small changes in phenytoin dose can result in large changes in levels. Monitor patient and counsel on signs of toxicity.  []  Beta blockers: increased risk of bradycardia, AV block and myocardial depression. Sotalol - avoid concomitant use.  []   Calcium channel blockers (diltiazem and verapamil): increased risk of bradycardia, AV block and myocardial depression.  []   Cyclosporine: Amiodarone increases levels of cyclosporine. Reduced dose of cyclosporine is recommended.  []  Digoxin dose should be halved when amiodarone is started.  []  Diuretics: increased risk of cardiotoxicity if hypokalemia occurs.  []  Oral hypoglycemic agents (glyburide, glipizide, glimepiride): increased risk of hypoglycemia. Patient's glucose levels should be monitored closely when initiating amiodarone therapy.   []  Drugs that prolong the QT interval:  Torsades de pointes  risk may be increased with concurrent use - avoid if possible.  Monitor QTc, also keep magnesium/potassium WNL if concurrent therapy can't be avoided. [name] Antibiotics: e.g. fluoroquinolones, erythromycin. . Antiarrhythmics: e.g. quinidine, procainamide, disopyramide, sotalol. . Antipsychotics: e.g. phenothiazines, haloperidol.  . Lithium, tricyclic antidepressants, and methadone.  Thank You,  Lynelle Doctor  07/02/2017 2:03 PM

## 2017-07-02 NOTE — Progress Notes (Addendum)
PCCM Acute Assessment  Subjective: Pt developed hypoxia and altered mental status.  Needed intubation by anesthesia.  Hypotensive after intubation.  Vital signs: BP (!) 144/70   Pulse 88   Temp 98.8 F (37.1 C)   Resp (!) 28   Ht 5\' 7"  (1.702 m)   Wt 190 lb 0.6 oz (86.2 kg)   SpO2 96%   BMI 29.76 kg/m   Intake/Output: I/O last 3 completed shifts: In: 1008.4 [I.V.:360.5; NG/GT:647.8] Out: 990 [Urine:990]  General - sedated Eyes - pupils reactive ENT - ETT in place Cardiac - irregular, tachycardic, no murmur Chest - b/l rales Abd - soft, non tender Ext - 1+ edema Skin - no rashes Neuro - medically paralyzed after intubation   CMP Latest Ref Rng & Units 07/02/2017 07/01/2017 06/30/2017  Glucose 65 - 99 mg/dL 196(H) 290(H) 202(H)  BUN 6 - 20 mg/dL 29(H) 24(H) 20  Creatinine 0.44 - 1.00 mg/dL 0.62 0.66 0.60  Sodium 135 - 145 mmol/L 143 140 141  Potassium 3.5 - 5.1 mmol/L 3.0(L) 3.5 3.5  Chloride 101 - 111 mmol/L 102 99(L) 100(L)  CO2 22 - 32 mmol/L 36(H) 35(H) 36(H)  Calcium 8.9 - 10.3 mg/dL 8.2(L) 8.0(L) 7.7(L)  Total Protein 6.5 - 8.1 g/dL - - -  Total Bilirubin 0.3 - 1.2 mg/dL - - -  Alkaline Phos 38 - 126 U/L - - -  AST 15 - 41 U/L - - -  ALT 14 - 54 U/L - - -    CBC Latest Ref Rng & Units 07/02/2017 07/01/2017 06/30/2017  WBC 4.0 - 10.5 K/uL 5.6 5.6 7.5  Hemoglobin 12.0 - 15.0 g/dL 8.0(L) 8.0(L) 7.9(L)  Hematocrit 36.0 - 46.0 % 26.1(L) 25.8(L) 25.1(L)  Platelets 150 - 400 K/uL 317 341 356    ABG    Component Value Date/Time   PHART 7.441 06/25/2017 1220   PCO2ART 43.7 06/25/2017 1220   PO2ART 155 (H) 06/25/2017 1220   HCO3 29.3 (H) 06/25/2017 1220   TCO2 28 03/28/2017 1927   ACIDBASEDEF 1.0 06/20/2017 1429   O2SAT 98.8 06/25/2017 1220    Dg Chest Port 1 View  Result Date: 07/02/2017 CLINICAL DATA:  Respiratory failure. EXAM: PORTABLE CHEST 1 VIEW COMPARISON:  05/01/2017. FINDINGS: Left IJ line and feeding tube in stable position. Stable cardiomegaly and  mild interstitial prominence. Faint density projected over the right upper lung may represent a fluid pseudotumor. Progressive left-sided pleural effusion. No pneumothorax. Stable splenic artery vascular calcification. Surgical clips are noted over the right chest. IMPRESSION: 1. Lines and tubes in stable position. 2. Persistent cardiomegaly with bilateral interstitial prominence and left-sided pleural effusion suggesting CHF. Left-sided pleural effusion has increased from prior exam. Fluid pseudotumor in the right major fissure cannot be excluded. Electronically Signed   By: Marcello Moores  Register   On: 07/02/2017 06:58   Dg Chest Port 1 View  Result Date: 07/01/2017 CLINICAL DATA:  Respiratory failure EXAM: PORTABLE CHEST 1 VIEW COMPARISON:  06/29/2017 FINDINGS: Support devices are stable. Cardiomegaly. Small bilateral pleural effusions, left greater than right. Bibasilar atelectasis. Mild vascular congestion. IMPRESSION: Continued small effusions with bibasilar atelectasis and vascular congestion. No real change. Electronically Signed   By: Rolm Baptise M.D.   On: 07/01/2017 07:10    Assessment/plan:  Recurrent hypoxic respiratory failure. - full vent support - f/u CXR, ABG - will need to d/w family about whether she will need trach  Hypotension from sedation. Acute systolic CHF after air embolism. Irregular tachycardia with hx of LBBB. -  levophed as needed to keep MAP > 65 - add amiodarone if HR remains up  C diff colitis. - enteral vancomycin  CC time 35 minutes.  Chesley Mires, MD [facility] Pulmonary/Critical Care 07/02/2017, 1:49 PM Pager:  [phone] After 3pm call: [phone]   I spoke with pt's daughter.  Had detailed discussion about current status and lack of improvement since she has been in hospital.  She understands this, and wants to discuss further with her sister and brother.  Explained that if they want to continue aggressive medical therapies, then they might need to consider  tracheostomy.  She is not sure this would be best option for her mother.  Will need to continue having conversation around goals of care.  Chesley Mires, MD [facility] Pulmonary/Critical Care 07/02/2017, 2:01 PM Pager:  [phone] After 3pm call: [phone]

## 2017-07-02 NOTE — Progress Notes (Signed)
SLP Cancellation Note  Patient Details Name: Christy [name] MRN: [id number] DOB: Mar 26, 1941   Cancelled treatment:       Reason Eval/Treat Not Completed: Medical issues which prohibited therapy (received for cancel order, pt now intubated)   Claudie Fisherman, [place] [facility] SLP [phone]

## 2017-07-03 ENCOUNTER — Inpatient Hospital Stay (HOSPITAL_COMMUNITY): Payer: Medicare Other

## 2017-07-03 LAB — GLUCOSE, CAPILLARY
GLUCOSE-CAPILLARY: 220 mg/dL — AB (ref 65–99)
GLUCOSE-CAPILLARY: 224 mg/dL — AB (ref 65–99)
GLUCOSE-CAPILLARY: 209 mg/dL — AB (ref 65–99)
GLUCOSE-CAPILLARY: 249 mg/dL — AB (ref 65–99)
GLUCOSE-CAPILLARY: 230 mg/dL — AB (ref 65–99)
Glucose-Capillary: 203 mg/dL — ABNORMAL HIGH (ref 65–99)
Glucose-Capillary: 167 mg/dL — ABNORMAL HIGH (ref 65–99)

## 2017-07-03 LAB — CBC
HEMATOCRIT: 25.9 % — AB (ref 36.0–46.0)
Hemoglobin: 8 g/dL — ABNORMAL LOW (ref 12.0–15.0)
MCH: 29.2 pg (ref 26.0–34.0)
MCHC: 30.9 g/dL (ref 30.0–36.0)
MCV: 94.5 fL (ref 78.0–100.0)
Platelets: 352 10*3/uL (ref 150–400)
RBC: 2.74 MIL/uL — ABNORMAL LOW (ref 3.87–5.11)
RDW: 16.3 % — ABNORMAL HIGH (ref 11.5–15.5)
WBC: 5.9 10*3/uL (ref 4.0–10.5)

## 2017-07-03 LAB — BASIC METABOLIC PANEL
Anion gap: 5 (ref 5–15)
BUN: 34 mg/dL — AB (ref 6–20)
CHLORIDE: 106 mmol/L (ref 101–111)
CO2: 33 mmol/L — AB (ref 22–32)
Calcium: 8.3 mg/dL — ABNORMAL LOW (ref 8.9–10.3)
Creatinine, Ser: 0.75 mg/dL (ref 0.44–1.00)
GFR calc Af Amer: >60 mL/min (ref >60)
GFR calc non Af Amer: >60 mL/min (ref >60)
GLUCOSE: 224 mg/dL — AB (ref 65–99)
POTASSIUM: 3 mmol/L — AB (ref 3.5–5.1)
Sodium: 144 mmol/L (ref 135–145)

## 2017-07-03 LAB — MAGNESIUM: Magnesium: 1.8 mg/dL (ref 1.7–2.4)

## 2017-07-03 LAB — PHOSPHORUS: Phosphorus: 1.4 mg/dL — ABNORMAL LOW (ref 2.5–4.6)

## 2017-07-03 MED ORDER — POTASSIUM CHLORIDE 20 MEQ/15ML (10%) PO SOLN
40.0000 meq | Freq: Once | ORAL | Status: AC
  Administered 2017-07-03: 40 meq
  Filled 2017-07-03: qty 30

## 2017-07-03 MED ORDER — POTASSIUM PHOSPHATES 15 MMOLE/5ML IV SOLN
30.0000 mmol | Freq: Once | INTRAVENOUS | Status: AC
  Administered 2017-07-03: 30 mmol via INTRAVENOUS
  Filled 2017-07-03: qty 10

## 2017-07-03 NOTE — Progress Notes (Signed)
PULMONARY / CRITICAL CARE MEDICINE   Name: Christy [name] MRN: [id number] DOB: [birth year]    ADMISSION DATE:  06/15/2017  REFERRING MD:  Dr. Sheran Fava, [facility]  CHIEF COMPLAINT:  Bradycardia  BRIEF SUMMARY:   76 y/o female had knee surgery and was at SNF.  She was found to have weakness and RLQ discomfort.  She then had altered mental status, and was hypotensive.  She was tx for septic shock and UTI.  She developed respiratory failure from HCAP with concern for aspiration and required intubation.  She extubated herself, and was transferred to medical ward.  On 06/25/17 she was noted to have pulled her Rt IJ central line out.  She then developed bradycardia, hypotension, altered mental status and respiratory failure.  She required intubation by anesthesia.  After being transferred to ICU she developed asystole.  She required CPR.  She eventually regained pulse and blood pressure.  She also was able to follow simple commands.  PCCM asked to resume primary care while in ICU.  SUBJECTIVE:   reintubated yesterday due to altered mental status, respiratory failure Off Levophed again today morning Brief episode of tachycardia-did not require amiodarone.  VITAL SIGNS: BP (!) 144/56   Pulse 81   Temp 99.3 F (37.4 C)   Resp 17   Ht 5\' 7"  (1.702 m)   Wt 180 lb 8.9 oz (81.9 kg)   SpO2 100%   BMI 28.28 kg/m   VENTILATOR SETTINGS: Vent Mode: PRVC FiO2 (%):  [40 %-100 %] 40 % Set Rate:  [18 bmp] 18 bmp Vt Set:  [490 mL] 490 mL PEEP:  [5 cmH20] 5 cmH20 Plateau Pressure:  [16 cmH20-28 cmH20] 16 cmH20  INTAKE / OUTPUT: I/O last 3 completed shifts: In: 367.5 [I.V.:307.5; NG/GT:60] Out: 750 [Urine:750]  PHYSICAL EXAMINATION: Blood pressure (!) 106/45, pulse 74, temperature 99 F (37.2 C), resp. rate 20, height 5\' 7"  (1.702 m), weight 180 lb 8.9 oz (81.9 kg), SpO2 100 %. Gen:      No acute distress HEENT:  EOMI, sclera anicteric, ETT Neck:     No masses; no thyromegaly Lungs:    Clear to  auscultation bilaterally; normal respiratory effort CV:         Regular rate and rhythm; no murmurs Abd:      + bowel sounds; soft, non-tender; no palpable masses, no distension Ext:    No edema; adequate peripheral perfusion Skin:      Warm and dry; no rash Neuro: Awake, interactive  LABS:  BMET  Recent Labs Lab 07/01/17 0352 07/02/17 0420 07/03/17 0500  NA 140 143 144  K 3.5 3.0* 3.0*  CL 99* 102 106  CO2 35* 36* 33*  BUN 24* 29* 34*  CREATININE 0.66 0.62 0.75  GLUCOSE 290* 196* 224*    Electrolytes  Recent Labs Lab 06/30/17 0404  07/01/17 0352 07/02/17 0420 07/03/17 0500  CALCIUM 7.6*  < > 8.0* 8.2* 8.3*  MG 1.7  --  1.7  --  1.8  PHOS 2.7  --  2.1*  --  1.4*  < > = values in this interval not displayed.  CBC  Recent Labs Lab 07/01/17 0352 07/02/17 0420 07/03/17 0500  WBC 5.6 5.6 5.9  HGB 8.0* 8.0* 8.0*  HCT 25.8* 26.1* 25.9*  PLT 341 317 352    Coag's No results for input(s): APTT, INR in the last 168 hours.  Sepsis Markers No results for input(s): LATICACIDVEN, PROCALCITON, O2SATVEN in the last 168 hours.  ABG  Recent Labs  Lab 07/02/17 1450  PHART 7.488*  PCO2ART 43.8  PO2ART 107    Liver Enzymes No results for input(s): AST, ALT, ALKPHOS, BILITOT, ALBUMIN in the last 168 hours.  Cardiac Enzymes No results for input(s): TROPONINI, PROBNP in the last 168 hours.  Glucose  Recent Labs Lab 07/02/17 1931 07/02/17 2340 07/03/17 0316 07/03/17 0827 07/03/17 1143 07/03/17 1307  GLUCAP 150* 263* 220* 203* 224* 209*    Imaging Dg Chest Port 1 View  Result Date: 07/03/2017 CLINICAL DATA:  Respiratory failure EXAM: PORTABLE CHEST 1 VIEW COMPARISON:  07/02/2017 FINDINGS: Endotracheal tube is 1.7 cm above the carina. Enteric tube extends into the stomach and beyond the inferior edge of the image. Left jugular central line extends into the low SVC. No pneumothorax. Persistent consolidation in the left base. Probable pleural effusions  bilaterally. Partial clearance of upper lobe airspace opacities bilaterally. IMPRESSION: 1. Satisfactorily positioned support equipment. 2. Partial clearance of upper lobe opacities. Persistent left base consolidation. Small effusions. Electronically Signed   By: Andreas Newport M.D.   On: 07/03/2017 05:44   Dg Chest Port 1 View  Result Date: 07/02/2017 CLINICAL DATA:  Acute respiratory distress.  ET tube placement. EXAM: PORTABLE CHEST 1 VIEW COMPARISON:  Chest x-ray from same day. FINDINGS: Interval placement of an endotracheal tube with the tip approximately 3.2 cm above the level of the carina. Feeding tube and left internal jugular central venous catheter are unchanged in position. Stable cardiomegaly. Left-sided pleural effusion appears decreased in size. Left basilar atelectasis. No pneumothorax. IMPRESSION: 1. Endotracheal tube in good position with the tip approximately 3.2 cm above the level of the carina. 2. Decrease in size of left pleural effusion. Electronically Signed   By: Titus Dubin M.D.   On: 07/02/2017 14:24    STUDIES:  Abd u/s 8/14 >> GB sludge, hepatic steatosis CT abd/pelvis 8/15 >> mild colon wall thickening Echo 8/15 >> mild LVH, EF 55 to 60% Echo 8/25 >> EF 20 to 25%  CULTURES: Urine 8/14 >> Klebsiella, Enterococcus Blood 8/14 >> negative Urine 8/24 >> negative Sputum 8/24 >>  Sputum 8/25 >>few candida  Blood 8/25 >> few candida guilliefermondii   C-Diff 8/26 >> positive antigen, PCR positive  ANTIBIOTICS: Vancomycin 8/15 >> 8/23 Meropenem 8/15 >> 8/25 Fortaz 8/25 >> 8/27 Vancomycin 8/25 >> 8/27  PO vanco 8/27 >>  SIGNIFICANT EVENTS: 8/14 Admit 8/15 Off pressors 8/19 ETT 8/20 Self extubated 8/24 Pulled out Rt IJ CVL >> cardiac arrest, back to ICU 8/25 New fever 8/26 Extubated 8/27 C-diff positive 8/31. reintubated  LINES/TUBES: ETT 8/24 >> 8/26, 8/31> Lt IJ CVL 8/24 >>   DISCUSSION: 76 yo female initially admitted for septic shock from  UTI.  Course complicated by HCAP and VDRF.  She then likely developed air embolism leading to cardiac arrest and respiratory failure after she removed her central line.  ASSESSMENT / PLAN:  PULMONARY A: Acute hypoxic respiratory failure after cardiac arrest and HCAP. P:   Continue vent support Respiratory is as tolerated She may be headed for her trach. We will need to discuss with the family.  CARDIOVASCULAR A:  Asystolic cardiac arrest after presumed air embolism 2nd to accidental removal of central line by patient with cardiogenic shock. Sepsis - in setting of C-diff  Acute systolic CHF after cardiac arrest. Acute on chronic diastolic CHF. Sinus bradycardia. Hx of LBBB. P:  Off Levophed Telemetry monitor 8/30 check cortisol level and it was adequate  RENAL A:   Metabolic acidosis >> resolved.  Hypokalemia, hypophosphatemia, hypomagnesemia. P:   Follow urine output and Cr Replete lytes as needed  GASTROINTESTINAL A:   Abdominal pain >> improved. Severe protein calorie malnutrition. Dysphagia. P:   Keep NPO Continue tube feeds and medications via feeding tube   HEMATOLOGIC A:   Anemia of critical illness. P:  Follow CBC SQ heparin  INFECTIOUS A:   C-Difficile Positive - 8/27 UTI >> resolved. HCAP. New fever 8/25 - C-diff positive  P: Discontinue IV abx 8/25  Now on oral vanco for C diff. Started 8/27>> Monitor fever curve, WBC trend   ENDOCRINE CBG (last 3)  A:   Hyperglycemia. P:   SSI coverage  NEUROLOGIC A:   Acute metabolic encephalopathy. Hx of depression. P:   Continue citalopram   Daughter updated at bedside 8/31  The patient is critically ill with multiple organ system failure and requires high complexity decision making for assessment and support, frequent evaluation and titration of therapies, advanced monitoring, review of radiographic studies and interpretation of complex data.   Critical Care Time devoted to patient care  services, exclusive of separately billable procedures, described in this note is 35 minutes.   Marshell Garfinkel MD [facility] Pulmonary and Critical Care Pager [phone] If no answer or after 3pm call: [phone] 07/03/2017, 1:50 PM

## 2017-07-03 NOTE — Progress Notes (Signed)
[facility] Progress Note Patient Name: Christy [name] DOB: 21-Aug-1941 MRN: [id number]   Date of Service  07/03/2017  HPI/Events of Note    eICU Interventions  Hypokalemia / hypophos -repleted      Intervention Category Intermediate Interventions: Electrolyte abnormality - evaluation and management  ALVA,RAKESH V. 07/03/2017, 6:32 AM

## 2017-07-04 ENCOUNTER — Inpatient Hospital Stay (HOSPITAL_COMMUNITY): Payer: Medicare Other

## 2017-07-04 DIAGNOSIS — J9690 Respiratory failure, unspecified, unspecified whether with hypoxia or hypercapnia: Secondary | ICD-10-CM

## 2017-07-04 LAB — CBC
HCT: 23.7 % — ABNORMAL LOW (ref 36.0–46.0)
Hemoglobin: 7.5 g/dL — ABNORMAL LOW (ref 12.0–15.0)
MCH: 29.6 pg (ref 26.0–34.0)
MCHC: 31.6 g/dL (ref 30.0–36.0)
MCV: 93.7 fL (ref 78.0–100.0)
Platelets: 353 10*3/uL (ref 150–400)
RBC: 2.53 MIL/uL — ABNORMAL LOW (ref 3.87–5.11)
RDW: 16.6 % — AB (ref 11.5–15.5)
WBC: 6.4 10*3/uL (ref 4.0–10.5)

## 2017-07-04 LAB — GLUCOSE, CAPILLARY
GLUCOSE-CAPILLARY: 199 mg/dL — AB (ref 65–99)
Glucose-Capillary: 212 mg/dL — ABNORMAL HIGH (ref 65–99)
Glucose-Capillary: 186 mg/dL — ABNORMAL HIGH (ref 65–99)
Glucose-Capillary: 196 mg/dL — ABNORMAL HIGH (ref 65–99)
Glucose-Capillary: 197 mg/dL — ABNORMAL HIGH (ref 65–99)
Glucose-Capillary: 156 mg/dL — ABNORMAL HIGH (ref 65–99)

## 2017-07-04 LAB — BASIC METABOLIC PANEL
Anion gap: 10 (ref 5–15)
BUN: 35 mg/dL — ABNORMAL HIGH (ref 6–20)
CALCIUM: 8.2 mg/dL — AB (ref 8.9–10.3)
CO2: 28 mmol/L (ref 22–32)
CREATININE: 0.7 mg/dL (ref 0.44–1.00)
Chloride: 104 mmol/L (ref 101–111)
GFR calc non Af Amer: >60 mL/min (ref >60)
Glucose, Bld: 234 mg/dL — ABNORMAL HIGH (ref 65–99)
Potassium: 3.5 mmol/L (ref 3.5–5.1)
SODIUM: 142 mmol/L (ref 135–145)

## 2017-07-04 LAB — MAGNESIUM: MAGNESIUM: 1.7 mg/dL (ref 1.7–2.4)

## 2017-07-04 LAB — PHOSPHORUS: PHOSPHORUS: 2.7 mg/dL (ref 2.5–4.6)

## 2017-07-04 NOTE — Progress Notes (Signed)
Spoke with patients daughter, [name], about meeting with Dr. Vaughan Browner tomorrow 9/3 for a goals of care meeting between 11am and 3pm as requested by Dr. Vaughan Browner. Pts daughter is agreeable to this meeting and is to speak with her sister, Earnstine Regal, and call the unit back tonight with a time they are available to meet.

## 2017-07-04 NOTE — Progress Notes (Signed)
PULMONARY / CRITICAL CARE MEDICINE   Name: Christy [name] MRN: [id number] DOB: [birth year]    ADMISSION DATE:  06/15/2017  REFERRING MD:  Dr. Sheran Fava, [facility]  CHIEF COMPLAINT:  Bradycardia  BRIEF SUMMARY:   76 y/o female had knee surgery and was at SNF.  She was found to have weakness and RLQ discomfort.  She then had altered mental status, and was hypotensive.  She was tx for septic shock and UTI.  She developed respiratory failure from HCAP with concern for aspiration and required intubation.  She extubated herself, and was transferred to medical ward.  On 06/25/17 she was noted to have pulled her Rt IJ central line out.  She then developed bradycardia, hypotension, altered mental status and respiratory failure.  She required intubation by anesthesia.  After being transferred to ICU she developed asystole.  She required CPR.  She eventually regained pulse and blood pressure.  She also was able to follow simple commands.  PCCM asked to resume primary care while in ICU.  SUBJECTIVE:   Continues on vent. Off pressors.  Weaning 10/5 today  VITAL SIGNS: BP (!) 103/46   Pulse 72   Temp 98.6 F (37 C)   Resp (!) 22   Ht 5\' 7"  (1.702 m)   Wt 184 lb 4.9 oz (83.6 kg)   SpO2 100%   BMI 28.87 kg/m   VENTILATOR SETTINGS: Vent Mode: PSV;CPAP FiO2 (%):  [40 %] 40 % Set Rate:  [18 bmp] 18 bmp Vt Set:  [490 mL] 490 mL PEEP:  [5 cmH20] 5 cmH20 Pressure Support:  [10 cmH20] 10 cmH20 Plateau Pressure:  [18 cmH20-20 cmH20] 18 cmH20  INTAKE / OUTPUT: I/O last 3 completed shifts: In: 1758.2 [I.V.:528.2; NG/GT:720; IV Piggyback:510] Out: 900 [Urine:900]  PHYSICAL EXAMINATION: Blood pressure (!) 103/46, pulse 72, temperature 98.6 F (37 C), resp. rate (!) 22, height 5\' 7"  (1.702 m), weight 184 lb 4.9 oz (83.6 kg), SpO2 100 %. Gen:      No acute distress HEENT:  EOMI, sclera anicteric, ETT  Neck:     No masses; no thyromegaly Lungs:    Clear to auscultation bilaterally; normal respiratory  effort CV:         Regular rate and rhythm; no murmurs Abd:      + bowel sounds; soft, non-tender; no palpable masses, no distension Ext:    No edema; adequate peripheral perfusion Skin:      Warm and dry; no rash Neuro: Sedated, Arousable  LABS:  BMET  Recent Labs Lab 07/01/17 0352 07/02/17 0420 07/03/17 0500  NA 140 143 144  K 3.5 3.0* 3.0*  CL 99* 102 106  CO2 35* 36* 33*  BUN 24* 29* 34*  CREATININE 0.66 0.62 0.75  GLUCOSE 290* 196* 224*    Electrolytes  Recent Labs Lab 06/30/17 0404  07/01/17 0352 07/02/17 0420 07/03/17 0500  CALCIUM 7.6*  < > 8.0* 8.2* 8.3*  MG 1.7  --  1.7  --  1.8  PHOS 2.7  --  2.1*  --  1.4*  < > = values in this interval not displayed.  CBC  Recent Labs Lab 07/02/17 0420 07/03/17 0500 07/04/17 0402  WBC 5.6 5.9 6.4  HGB 8.0* 8.0* 7.5*  HCT 26.1* 25.9* 23.7*  PLT 317 352 353    Coag's No results for input(s): APTT, INR in the last 168 hours.  Sepsis Markers No results for input(s): LATICACIDVEN, PROCALCITON, O2SATVEN in the last 168 hours.  ABG  Recent Labs Lab  07/02/17 1450  PHART 7.488*  PCO2ART 43.8  PO2ART 107    Liver Enzymes No results for input(s): AST, ALT, ALKPHOS, BILITOT, ALBUMIN in the last 168 hours.  Cardiac Enzymes No results for input(s): TROPONINI, PROBNP in the last 168 hours.  Glucose  Recent Labs Lab 07/03/17 1542 07/03/17 1937 07/03/17 2310 07/04/17 0315 07/04/17 0955 07/04/17 1219  GLUCAP 249* 230* 167* 212* 199* 186*    Imaging Dg Chest Port 1 View  Result Date: 07/04/2017 CLINICAL DATA:  Acute respiratory failure.  Ex-smoker. EXAM: PORTABLE CHEST 1 VIEW COMPARISON:  07/03/2017. FINDINGS: Endotracheal tube in satisfactory position. Feeding tube extending into the stomach. Left jugular catheter tip in the superior vena cava. Stable enlarged cardiac silhouette and mildly prominent interstitial markings. No significant change in left basilar airspace opacity, including dense left  lower lobe opacity. Improved aeration at the right lung base. Thoracic spine degenerative changes. IMPRESSION: 1. Stable left basilar atelectasis or pneumonia. 2. Resolved right basilar atelectasis. 3. Stable cardiomegaly and mild chronic interstitial lung disease. Electronically Signed   By: Claudie Revering M.D.   On: 07/04/2017 07:13    STUDIES:  Abd u/s 8/14 >> GB sludge, hepatic steatosis CT abd/pelvis 8/15 >> mild colon wall thickening Echo 8/15 >> mild LVH, EF 55 to 60% Echo 8/25 >> EF 20 to 25%  CULTURES: Urine 8/14 >> Klebsiella, Enterococcus Blood 8/14 >> negative Urine 8/24 >> negative Sputum 8/24 >>  Sputum 8/25 >>few candida  Blood 8/25 >> few candida guilliefermondii   C-Diff 8/26 >> positive antigen, PCR positive  ANTIBIOTICS: Vancomycin 8/15 >> 8/23 Meropenem 8/15 >> 8/25 Fortaz 8/25 >> 8/27 Vancomycin 8/25 >> 8/27  PO vanco 8/27 >>  SIGNIFICANT EVENTS: 8/14 Admit 8/15 Off pressors 8/19 ETT 8/20 Self extubated 8/24 Pulled out Rt IJ CVL >> cardiac arrest, back to ICU 8/25 New fever 8/26 Extubated 8/27 C-diff positive 8/31. reintubated  LINES/TUBES: ETT 8/24 >> 8/26, 8/31> Lt IJ CVL 8/24 >>   DISCUSSION: 76 yo female initially admitted for septic shock from UTI.  Course complicated by HCAP and VDRF.  She then likely developed air embolism leading to cardiac arrest and respiratory failure after she removed her central line.  ASSESSMENT / PLAN:  PULMONARY A: Acute hypoxic respiratory failure after cardiac arrest and HCAP. P:   Continue vent support Respiratory is as tolerated She may be headed for her trach. We will need to discuss with the family. Try to arrange family meeting tomorrow.   CARDIOVASCULAR A:  Asystolic cardiac arrest after presumed air embolism 2nd to accidental removal of central line by patient with cardiogenic shock. Sepsis - in setting of C-diff  Acute systolic CHF after cardiac arrest. Acute on chronic diastolic CHF. Sinus  bradycardia. Hx of LBBB. P:  Off Levophed Telemetry monitor 8/30 check cortisol level and it was adequate  RENAL A:   Metabolic acidosis >> resolved. Hypokalemia, hypophosphatemia, hypomagnesemia. P:   Follow urine output and Cr Replete lytes as needed  GASTROINTESTINAL A:   Abdominal pain >> improved. Severe protein calorie malnutrition. Dysphagia. P:   Keep NPO Continue tube feeds and medications via feeding tube   HEMATOLOGIC A:   Anemia of critical illness. P:  Follow CBC SQ heparin  INFECTIOUS A:   C-Difficile Positive - 8/27 UTI >> resolved. HCAP. New fever 8/25 - C-diff positive  P: Discontinue IV abx 8/25  Now on oral vanco for C diff. Started 8/27>> Monitor fever curve, WBC trend   ENDOCRINE CBG (last 3)  A:   Hyperglycemia. P:   SSI coverage  NEUROLOGIC A:   Acute metabolic encephalopathy. Hx of depression. P:   Continue citalopram   Daughter updated at bedside 8/31  The patient is critically ill with multiple organ system failure and requires high complexity decision making for assessment and support, frequent evaluation and titration of therapies, advanced monitoring, review of radiographic studies and interpretation of complex data.   Critical Care Time devoted to patient care services, exclusive of separately billable procedures, described in this note is 35 minutes.   Marshell Garfinkel MD [place] [facility] Pager [phone] If no answer or after 3pm call: [phone] 07/04/2017, 2:21 PM

## 2017-07-04 NOTE — Progress Notes (Signed)
PMT met with family earlier this admission and plan at that point was continuation of full, aggressive care.    I stopped by today to check in with family as she has now been reintubated and note consideration that she may need tracheostomy if desire remains for aggressive interventions.  No family was present in the room.  I will check in again tomorrow and have also left my card with her RN to give to family to call if they are agreeable to meeting again with our team.  NO CHARGE NOTE  Micheline Rough, MD [place] Team [phone]

## 2017-07-05 LAB — BASIC METABOLIC PANEL
ANION GAP: 8 (ref 5–15)
BUN: 37 mg/dL — ABNORMAL HIGH (ref 6–20)
CHLORIDE: 103 mmol/L (ref 101–111)
CO2: 31 mmol/L (ref 22–32)
Calcium: 8.1 mg/dL — ABNORMAL LOW (ref 8.9–10.3)
Creatinine, Ser: 0.66 mg/dL (ref 0.44–1.00)
GFR calc non Af Amer: >60 mL/min (ref >60)
Glucose, Bld: 215 mg/dL — ABNORMAL HIGH (ref 65–99)
POTASSIUM: 3.2 mmol/L — AB (ref 3.5–5.1)
Sodium: 142 mmol/L (ref 135–145)

## 2017-07-05 LAB — GLUCOSE, CAPILLARY
GLUCOSE-CAPILLARY: 205 mg/dL — AB (ref 65–99)
GLUCOSE-CAPILLARY: 216 mg/dL — AB (ref 65–99)
GLUCOSE-CAPILLARY: 163 mg/dL — AB (ref 65–99)
GLUCOSE-CAPILLARY: 188 mg/dL — AB (ref 65–99)
Glucose-Capillary: 211 mg/dL — ABNORMAL HIGH (ref 65–99)
Glucose-Capillary: 181 mg/dL — ABNORMAL HIGH (ref 65–99)

## 2017-07-05 LAB — PHOSPHORUS: PHOSPHORUS: 3.1 mg/dL (ref 2.5–4.6)

## 2017-07-05 LAB — CBC
HEMATOCRIT: 24.8 % — AB (ref 36.0–46.0)
HEMOGLOBIN: 7.6 g/dL — AB (ref 12.0–15.0)
MCH: 28.4 pg (ref 26.0–34.0)
MCHC: 30.6 g/dL (ref 30.0–36.0)
MCV: 92.5 fL (ref 78.0–100.0)
Platelets: 390 10*3/uL (ref 150–400)
RBC: 2.68 MIL/uL — ABNORMAL LOW (ref 3.87–5.11)
RDW: 16.6 % — ABNORMAL HIGH (ref 11.5–15.5)
WBC: 5.7 10*3/uL (ref 4.0–10.5)

## 2017-07-05 LAB — MAGNESIUM: Magnesium: 1.8 mg/dL (ref 1.7–2.4)

## 2017-07-05 MED ORDER — FREE WATER
100.0000 mL | Freq: Four times a day (QID) | Status: DC
  Administered 2017-07-05 – 2017-07-07 (×7): 100 mL

## 2017-07-05 MED ORDER — PRO-STAT SUGAR FREE PO LIQD
30.0000 mL | Freq: Three times a day (TID) | ORAL | Status: DC
  Administered 2017-07-05 – 2017-07-06 (×5): 30 mL
  Filled 2017-07-05 (×5): qty 30

## 2017-07-05 MED ORDER — INSULIN ASPART 100 UNIT/ML ~~LOC~~ SOLN
0.0000 [IU] | SUBCUTANEOUS | Status: DC
  Administered 2017-07-05: 7 [IU] via SUBCUTANEOUS
  Administered 2017-07-05: 3 [IU] via SUBCUTANEOUS
  Administered 2017-07-05 – 2017-07-06 (×2): 4 [IU] via SUBCUTANEOUS
  Administered 2017-07-06: 7 [IU] via SUBCUTANEOUS
  Administered 2017-07-06: 4 [IU] via SUBCUTANEOUS
  Administered 2017-07-06: 7 [IU] via SUBCUTANEOUS
  Administered 2017-07-06 (×2): 4 [IU] via SUBCUTANEOUS
  Administered 2017-07-07: 3 [IU] via SUBCUTANEOUS
  Administered 2017-07-07: 4 [IU] via SUBCUTANEOUS

## 2017-07-05 MED ORDER — OSMOLITE 1.5 CAL PO LIQD
1000.0000 mL | ORAL | Status: DC
  Administered 2017-07-05 – 2017-07-06 (×2): 1000 mL
  Filled 2017-07-05 (×4): qty 1000

## 2017-07-05 MED ORDER — ADULT MULTIVITAMIN LIQUID CH
15.0000 mL | Freq: Every day | ORAL | Status: DC
  Administered 2017-07-05 – 2017-07-06 (×2): 15 mL
  Filled 2017-07-05 (×3): qty 15

## 2017-07-05 NOTE — Progress Notes (Signed)
CSW following to assist with dc planning. Pt is currently intubated. Palliative Care Team is following for [place]. CSW will remain available to assist with dc planning, as needed.  Werner Lean LCSW [phone]

## 2017-07-05 NOTE — Progress Notes (Signed)
Nutrition Follow-up  DOCUMENTATION CODES:   Severe malnutrition in context of acute illness/injury  INTERVENTION:  - Will adjust TF regimen: Osmolite 1.5 @ 40 mL/hr with 30 mL Prostat TID and 100 mL free water QID which will provide 1740 kcal, 105 grams of protein, and 1032 mL free water - Will add 15 mL liquid multivitamin/day.  NUTRITION DIAGNOSIS:   Malnutrition (severe) related to acute illness, dysphagia as evidenced by percent weight loss, energy intake < or equal to 50% for > or equal to 5 days. -ongoing  GOAL:   Patient will meet greater than or equal to 90% of their needs -unmet with current TF rate.   MONITOR:   Vent status, TF tolerance, Weight trends, Labs, Skin  ASSESSMENT:   76 y.o. female currently residing in a nursing home after deconditioning following a knee surgery in June. Recently found to have an ESBL Klebsiella pneumonia urinary tract infection. Treated briefly with Bactrim intravenously which was subsequently converted to oral. Initially the patient did improve then was noted to have decreased level of consciousness today and was hypotensive with systolic blood pressure in the 60-70s. Patient was found have a lactic acidosis as well as acute renal failure  9/3 Pt was re-intubated 8/31 afternoon and NGT remains in place. She continues with Osmolite 1.5 @ 30 mL/hr with 60 mL Prostat BID. Estimated nutrition needs updated this AM based on re-intubation and current weight of 83.8 kg; weight fluctuations throughout admission.   Dr. Vaughan Browner and Dr. Domingo Cocking met with patient's daughters this AM for discussion of [place]. Code status changed from Full Code to DNR. Spoke with Dr. Vaughan Browner and he reports plan for one-way extubation today or tomorrow. He states "hopeful for the best" but if pt does not do well, plan will be to transition to comfort care.   Patient is currently intubated on ventilator support MV: 8.6 L/min Temp (24hrs), Avg:99.8 F (37.7 C), Min:98.4 F (36.9  C), Max:100.8 F (38.2 C) Propofol: none BP: 113/51 and MAP: 69  Medications reviewed; sliding scale Novolog. Labs reviewed; CBGs: 205 and 216 mg/dL this AM, K: 3.2 mmol/L, BUN: 37 mg/dL, Ca: 8.1 mg/dL. Mg and Phos WDL today.  IVF:LR @ 10 mL/hr (240 mL/day).    8/31 - Pt receiving Osmolite 1.5 @ 30 mL/hr with 60 mL Prostat BID which is providing 1480 kcal (78% minimum estimated kcal need), 105 grams of protein (100% estimated protein need), and 549 mL free water.  - Will maintain this rate as pt hypokalemic again today; no Mg or Phos labs but suspect they are likely low as well given Phos yesterday and Mg of 1.7 mg/dL yesterday.  - Goal rate for TF is Osmolite 1.5 @ 50 mL/hr with 30 mL Prostat TID which will provide 2100 kcal, 120 grams of protein (104% maximum estimated protein need), and 914 mL free water.  - Pt with C. Diff and anasarca.  - Per PCCM: "Will eventually need to be assessed by the heart failure team."  K: 3 mmol/L IVF:LR @ 10 mL/hr (240 mL/day).   8/30 - Pt with Panda and is receiving Osmolite 1.5 @ 20 mL/hr with 60 mL Prostat BID.  - Current weight -2 lbs (1 kg) from yesterday and is -6 lbs (2.4 kg) compared to admission weight.  - Hypokalemia is resolved at this time and pt now with hypophosphatemia.  - Will increase TF rate with close monitoring of K, Mg, and Phos trends.   Phos: 2.1 mg/dL. IVF:LR @ 10 mL/hr (  240 mL/day).   Diet Order:  Diet NPO time specified  Skin:  Wound (see comment) (Stage 1 sacral and L heel pressure injuries)  Last BM:  9/2  Height:   Ht Readings from Last 1 Encounters:  06/25/17 _0  (1.702 m)    Weight:   Wt Readings from Last 1 Encounters:  07/05/17 184 lb 11.9 oz (83.8 kg)    Ideal Body Weight:  61.4 kg  BMI:  Body mass index is 28.94 kg/m.  Estimated Nutritional Needs:   Kcal:  0349  Protein:  101-126 grams (1.2-1.5 grams/kg)  Fluid:  1.8L/day  EDUCATION NEEDS:   Education needs no appropriate at  this time    Jarome Matin, MS, RD, LDN, CNSC Inpatient Clinical Dietitian Pager # [phone] After hours/weekend pager # [phone]

## 2017-07-05 NOTE — Procedures (Signed)
Extubation Procedure Note  Patient Details:   Name: Christy [name] DOB: 08-09-1941 MRN: [id number]   Airway Documentation:     Evaluation  O2 sats: 96 Complications: none Patient tolerated procedure well. Bilateral Breath Sounds: Diminished   Pt not speaking at this time.  Per CCM order, pt extubated and placed on 2L nasal cannula.  Martha Clan 07/05/2017, 1:54 PM

## 2017-07-05 NOTE — Care Management Note (Signed)
Case Management Note  Patient Details  Name: Percy Comp MRN: [id number] Date of Birth: Dec 03, 1940  Subjective/Objective:       reintubated on [id number]             Action/Plan: Date:  July 05, 2017 Chart reviewed for concurrent status and case management needs. Will continue to follow patient progress. Discharge Planning: following for needs Expected discharge date: [id number] Velva Harman, BSN, [place], [place]  Expected Discharge Date:   (unknown)               Expected Discharge Plan:  Home/Self Care  In-House Referral:     Discharge planning Services  CM Consult  Post Acute Care Choice:    Choice offered to:     DME Arranged:    DME Agency:     HH Arranged:    [place] Agency:     Status of Service:  In process, will continue to follow  If discussed at Long Length of Stay Meetings, dates discussed:    Additional Comments:  Leeroy Cha, RN 07/05/2017, 8:57 AM

## 2017-07-05 NOTE — Progress Notes (Signed)
Palliative Care Progress Note  Reason for consult: Goals of care in light of continued ventilator dependent respiratory failure  I met today with Christy [name]'s three children in conjunction with Dr. Vaughan Browner from PCCM.  We reviewed her clinical course to this point in time including prior cardiac arrest with CPR and multiple failed extubations requiring reintubation.  We discussed pathways forward including plan to medically maximize her for extubation (either today or tomorrow) with plan for no reintubation vs possible reintubation and likely trach.  We also discussed that, in her current condition, she is unlikely to benefit from CPR in the event of another cardiac arrest.  Her family is in agreement with moving forward with plan for one way extubation.  They are in agreement with order for DO NOT RESUSCITATE in the event of another cardiac or respiratory arrest.    Reviewed plan of care with bedside RN.  Total time: 25 minutes Greater than 50%  of this time was spent counseling and coordinating care related to the above assessment and plan.  Christy Rough, MD [place] Team [phone]

## 2017-07-05 NOTE — Plan of Care (Signed)
  Interdisciplinary Goals of Care Family Meeting   Date carried out:: 07/05/2017  Location of the meeting: Conference room  Member's involved: Physician, Family Member or next of kin and Palliative care team member  Durable Power of Attorney or acting medical decision maker: 2 daughters, son  Discussion: We discussed goals of care for Christy [name], Christy [name] .  Dr. Domingo Cocking from palliative care and I met with the 2 daughter and sons. Reviewed her clinical course including prolonged hospitalization with multiple intubations. She may be ready for another trial of extubation but we're concerned that given her declining course and weakness she may declined again. We discussed various options including reintubation, tracheostomy CPR. The family is clear that they would not want her mother to be put through this again. They have transitioned goals of care to DNR.  Code status: Full DNR  Disposition: Plan for one way extubation when she is ready. Continue other medical therapies. If she should decline then consider comfort measures.   Time spent for the meeting: 15 mins  [place] 07/05/2017, 11:58 AM

## 2017-07-05 NOTE — Progress Notes (Signed)
PULMONARY / CRITICAL CARE MEDICINE   Name: Christy [name] MRN: [id number] DOB: Apr 01, 1941    ADMISSION DATE:  06/15/2017  REFERRING MD:  Dr. Sheran Fava, [facility]  CHIEF COMPLAINT:  Bradycardia  BRIEF SUMMARY:   76 y/o female had knee surgery and was at SNF.  She was found to have weakness and RLQ discomfort.  She then had altered mental status, and was hypotensive.  She was tx for septic shock and UTI.  She developed respiratory failure from HCAP with concern for aspiration and required intubation.  She extubated herself, and was transferred to medical ward.  On 06/25/17 she was noted to have pulled her Rt IJ central line out.  She then developed bradycardia, hypotension, altered mental status and respiratory failure.  She required intubation by anesthesia.  After being transferred to ICU she developed asystole.  She required CPR.  She eventually regained pulse and blood pressure.  She also was able to follow simple commands.  PCCM asked to resume primary care while in ICU.  SUBJECTIVE:   On vent Weaning 5/5  VITAL SIGNS: BP (!) 127/40   Pulse 84   Temp 99.7 F (37.6 C)   Resp (!) 25   Ht 5\' 7"  (1.702 m)   Wt 184 lb 11.9 oz (83.8 kg)   SpO2 99%   BMI 28.94 kg/m   VENTILATOR SETTINGS: Vent Mode: PSV;CPAP FiO2 (%):  [30 %-40 %] 30 % Set Rate:  [18 bmp] 18 bmp Vt Set:  [490 mL] 490 mL PEEP:  [5 cmH20] 5 cmH20 Pressure Support:  [5 cmH20-10 cmH20] 5 cmH20 Plateau Pressure:  [18 cmH20-23 cmH20] 18 cmH20  INTAKE / OUTPUT: I/O last 3 completed shifts: In: 1481.5 [I.V.:401.5; NG/GT:1080] Out: 2775 [Urine:1075; Stool:1700]  PHYSICAL EXAMINATION: Gen:      No acute distress HEENT:  EOMI, sclera anicteric, ETT Neck:     No masses; no thyromegaly Lungs:    Scattered rhonchi; normal respiratory effort CV:         Regular rate and rhythm; no murmurs Abd:      + bowel sounds; soft, non-tender; no palpable masses, no distension Ext:    No edema; adequate peripheral perfusion Skin:      Warm  and dry; no rash Neuro: sedated, arousable  LABS:  BMET  Recent Labs Lab 07/03/17 0500 07/04/17 0402 07/05/17 0424  NA 144 142 142  K 3.0* 3.5 3.2*  CL 106 104 103  CO2 33* 28 31  BUN 34* 35* 37*  CREATININE 0.75 0.70 0.66  GLUCOSE 224* 234* 215*    Electrolytes  Recent Labs Lab 07/03/17 0500 07/04/17 0402 07/05/17 0424  CALCIUM 8.3* 8.2* 8.1*  MG 1.8 1.7 1.8  PHOS 1.4* 2.7 3.1    CBC  Recent Labs Lab 07/03/17 0500 07/04/17 0402 07/05/17 0424  WBC 5.9 6.4 5.7  HGB 8.0* 7.5* 7.6*  HCT 25.9* 23.7* 24.8*  PLT 352 353 390    Coag's No results for input(s): APTT, INR in the last 168 hours.  Sepsis Markers No results for input(s): LATICACIDVEN, PROCALCITON, O2SATVEN in the last 168 hours.  ABG  Recent Labs Lab 07/02/17 1450  PHART 7.488*  PCO2ART 43.8  PO2ART 107    Liver Enzymes No results for input(s): AST, ALT, ALKPHOS, BILITOT, ALBUMIN in the last 168 hours.  Cardiac Enzymes No results for input(s): TROPONINI, PROBNP in the last 168 hours.  Glucose  Recent Labs Lab 07/04/17 1631 07/04/17 1948 07/04/17 2313 07/05/17 0332 07/05/17 0738 07/05/17 1128  GLUCAP  196* 197* 156* 205* 216* 211*    Imaging No results found.  STUDIES:  Abd u/s 8/14 >> GB sludge, hepatic steatosis CT abd/pelvis 8/15 >> mild colon wall thickening Echo 8/15 >> mild LVH, EF 55 to 60% Echo 8/25 >> EF 20 to 25%  CULTURES: Urine 8/14 >> Klebsiella, Enterococcus Blood 8/14 >> negative Urine 8/24 >> negative Sputum 8/24 >>  Sputum 8/25 >>few candida  Blood 8/25 >> few candida guilliefermondii   C-Diff 8/26 >> positive antigen, PCR positive  ANTIBIOTICS: Vancomycin 8/15 >> 8/23 Meropenem 8/15 >> 8/25 Fortaz 8/25 >> 8/27 Vancomycin 8/25 >> 8/27  PO vanco 8/27 >>  SIGNIFICANT EVENTS: 8/14 Admit 8/15 Off pressors 8/19 ETT 8/20 Self extubated 8/24 Pulled out Rt IJ CVL >> cardiac arrest, back to ICU 8/25 New fever 8/26 Extubated 8/27 C-diff  positive 8/31. reintubated  LINES/TUBES: ETT 8/24 >> 8/26, 8/31> Lt IJ CVL 8/24 >>   DISCUSSION: 76 yo female initially admitted for septic shock from UTI.  Course complicated by HCAP and VDRF.  She then likely developed air embolism leading to cardiac arrest and respiratory failure after she removed her central line.  ASSESSMENT / PLAN:  PULMONARY A: Acute hypoxic respiratory failure after cardiac arrest and HCAP. P:   SBTs as tolerated May be able to extubate soon Will need to clarify goals of care with family  CARDIOVASCULAR A:  Asystolic cardiac arrest after presumed air embolism 2nd to accidental removal of central line by patient with cardiogenic shock. Sepsis - in setting of C-diff  Acute systolic CHF after cardiac arrest. Acute on chronic diastolic CHF. Sinus bradycardia. Hx of LBBB. P:  Off Levophed Telemetry monitor 8/30 check cortisol level and it was adequate  RENAL A:   Metabolic acidosis >> resolved. Hypokalemia, hypophosphatemia, hypomagnesemia. P:   Follow urine output and Cr Replete lytes as needed  GASTROINTESTINAL A:   Abdominal pain >> improved. Severe protein calorie malnutrition. Dysphagia. P:   Keep NPO Continue tube feeds and medications via feeding tube Zantac for ulcer prophylaxis  HEMATOLOGIC A:   Anemia of critical illness. P:  Follow CBC SQ heparin  INFECTIOUS A:   C-Difficile Positive - 8/27 UTI >> resolved. HCAP. New fever 8/25 - C-diff positive  P: Discontinued IV abx 8/25  Now on oral vanco for C diff. Day 8/10 Monitor fever curve, WBC trend   ENDOCRINE CBG (last 3)  A:   Hyperglycemia. P:   SSI coverage Switch to resistant scale  NEUROLOGIC A:   Acute metabolic encephalopathy. Hx of depression. P:   Continue citalopram   Family meeting today. See separate note.  The patient is critically ill with multiple organ system failure and requires high complexity decision making for assessment and support,  frequent evaluation and titration of therapies, advanced monitoring, review of radiographic studies and interpretation of complex data.   Critical Care Time devoted to patient care services, exclusive of separately billable procedures, described in this note is 35 minutes.   Marshell Garfinkel MD [place] [facility] Pager [phone] If no answer or after 3pm call: [phone] 07/05/2017, 11:50 AM

## 2017-07-06 ENCOUNTER — Inpatient Hospital Stay (HOSPITAL_COMMUNITY): Payer: Medicare Other

## 2017-07-06 LAB — BASIC METABOLIC PANEL
Anion gap: 7 (ref 5–15)
BUN: 30 mg/dL — AB (ref 6–20)
CALCIUM: 8.3 mg/dL — AB (ref 8.9–10.3)
CO2: 31 mmol/L (ref 22–32)
CREATININE: 0.5 mg/dL (ref 0.44–1.00)
Chloride: 105 mmol/L (ref 101–111)
GFR calc Af Amer: >60 mL/min (ref >60)
GFR calc non Af Amer: >60 mL/min (ref >60)
GLUCOSE: 197 mg/dL — AB (ref 65–99)
Potassium: 3.1 mmol/L — ABNORMAL LOW (ref 3.5–5.1)
Sodium: 143 mmol/L (ref 135–145)

## 2017-07-06 LAB — PHOSPHORUS: Phosphorus: 3.4 mg/dL (ref 2.5–4.6)

## 2017-07-06 LAB — CBC
HEMATOCRIT: 25.7 % — AB (ref 36.0–46.0)
Hemoglobin: 7.9 g/dL — ABNORMAL LOW (ref 12.0–15.0)
MCH: 29.2 pg (ref 26.0–34.0)
MCHC: 30.7 g/dL (ref 30.0–36.0)
MCV: 94.8 fL (ref 78.0–100.0)
Platelets: 385 10*3/uL (ref 150–400)
RBC: 2.71 MIL/uL — ABNORMAL LOW (ref 3.87–5.11)
RDW: 16.5 % — AB (ref 11.5–15.5)
WBC: 5.6 10*3/uL (ref 4.0–10.5)

## 2017-07-06 LAB — GLUCOSE, CAPILLARY
GLUCOSE-CAPILLARY: 186 mg/dL — AB (ref 65–99)
GLUCOSE-CAPILLARY: 186 mg/dL — AB (ref 65–99)
GLUCOSE-CAPILLARY: 223 mg/dL — AB (ref 65–99)
Glucose-Capillary: 190 mg/dL — ABNORMAL HIGH (ref 65–99)
Glucose-Capillary: 172 mg/dL — ABNORMAL HIGH (ref 65–99)
Glucose-Capillary: 227 mg/dL — ABNORMAL HIGH (ref 65–99)

## 2017-07-06 LAB — MAGNESIUM: Magnesium: 1.8 mg/dL (ref 1.7–2.4)

## 2017-07-06 MED ORDER — TORSEMIDE 20 MG PO TABS
20.0000 mg | ORAL_TABLET | Freq: Once | ORAL | Status: AC
  Administered 2017-07-06: 20 mg via ORAL
  Filled 2017-07-06: qty 1

## 2017-07-06 MED ORDER — LIP MEDEX EX OINT
TOPICAL_OINTMENT | CUTANEOUS | Status: AC
  Administered 2017-07-06: 19:00:00
  Filled 2017-07-06: qty 7

## 2017-07-06 MED ORDER — POTASSIUM CHLORIDE 10 MEQ/50ML IV SOLN
10.0000 meq | INTRAVENOUS | Status: AC
  Administered 2017-07-06 (×4): 10 meq via INTRAVENOUS
  Filled 2017-07-06 (×4): qty 50

## 2017-07-06 MED ORDER — MORPHINE SULFATE (PF) 2 MG/ML IV SOLN
2.0000 mg | INTRAVENOUS | Status: DC | PRN
  Administered 2017-07-06 – 2017-07-08 (×5): 2 mg via INTRAVENOUS
  Filled 2017-07-06 (×5): qty 1

## 2017-07-06 NOTE — Progress Notes (Signed)
[facility] [place] Progress Note Patient Name: Christy [name] DOB: 03-Aug-1941 MRN: [id number]   Date of Service  07/06/2017  HPI/Events of Note  K+ = 3.1 and Creatinine = 0.50.  eICU Interventions  Will replace K+.     Intervention Category Major Interventions: Electrolyte abnormality - evaluation and management  Sommer,Steven Eugene 07/06/2017, 6:29 AM

## 2017-07-06 NOTE — Progress Notes (Signed)
NTS pt, producing large amount of thick tan secretions. Suction catheter advanced into the rt nare of pt. Pt tolerated fairly well. Preoxygenated pt prior to suctioning. SAT and ht rate remained stable thru out procedure. RT will continue to monitor.

## 2017-07-06 NOTE — Progress Notes (Signed)
PULMONARY / CRITICAL CARE MEDICINE   Name: Christy [name] MRN: [id number] DOB: [birth year]    ADMISSION DATE:  06/15/2017  REFERRING MD:  Dr. Sheran Fava, [facility]  CHIEF COMPLAINT:  Bradycardia  BRIEF SUMMARY:   76 y/o female had knee surgery and was at SNF.  She was found to have weakness and RLQ discomfort.  She then had altered mental status, and was hypotensive.  She was tx for septic shock and UTI.  She developed respiratory failure from HCAP with concern for aspiration and required intubation.  She extubated herself, and was transferred to medical ward.  On 06/25/17 she was noted to have pulled her Rt IJ central line out.  She then developed bradycardia, hypotension, altered mental status and respiratory failure.  She required intubation by anesthesia.  After being transferred to ICU she developed asystole.  She required CPR.  She eventually regained pulse and blood pressure.  She also was able to follow simple commands.  PCCM asked to resume primary care while in ICU.  SUBJECTIVE:   Extubated Made DNR yesterday after conversation with palliative and PCCM Some rattling in chest today  VITAL SIGNS: BP (!) 155/67 (BP Location: Left Arm)   Pulse 80   Temp 98.4 F (36.9 C) (Core (Comment))   Resp (!) 23   Ht 5\' 7"  (1.702 m)   Wt 84.2 kg (185 lb 10 oz)   SpO2 98%   BMI 29.07 kg/m   VENTILATOR SETTINGS: Vent Mode: PSV;CPAP FiO2 (%):  [30 %] 30 % PEEP:  [5 cmH20] 5 cmH20 Pressure Support:  [5 cmH20] 5 cmH20  INTAKE / OUTPUT: I/O last 3 completed shifts: In: 1070 [I.V.:250; NG/GT:820] Out: 1265 [Urine:865; Stool:400]  PHYSICAL EXAMINATION:  General:  Chronically ill appearing, resting in bed, increased work of breathing HENT: NCAT OP clear, slightly dry PULM: Rhonchi bilaterally B, increased work of breathing CV: RRR, no mgr GI: infrequent bowel sounds MSK: normal bulk and tone Neuro: sleepy, will nod head to questions  LABS:  BMET  Recent Labs Lab 07/04/17 0402  07/05/17 0424 07/06/17 0500  NA 142 142 143  K 3.5 3.2* 3.1*  CL 104 103 105  CO2 28 31 31   BUN 35* 37* 30*  CREATININE 0.70 0.66 0.50  GLUCOSE 234* 215* 197*    Electrolytes  Recent Labs Lab 07/04/17 0402 07/05/17 0424 07/06/17 0500  CALCIUM 8.2* 8.1* 8.3*  MG 1.7 1.8 1.8  PHOS 2.7 3.1 3.4    CBC  Recent Labs Lab 07/04/17 0402 07/05/17 0424 07/06/17 0500  WBC 6.4 5.7 5.6  HGB 7.5* 7.6* 7.9*  HCT 23.7* 24.8* 25.7*  PLT 353 390 385    Coag's No results for input(s): APTT, INR in the last 168 hours.  Sepsis Markers No results for input(s): LATICACIDVEN, PROCALCITON, O2SATVEN in the last 168 hours.  ABG  Recent Labs Lab 07/02/17 1450  PHART 7.488*  PCO2ART 43.8  PO2ART 107    Liver Enzymes No results for input(s): AST, ALT, ALKPHOS, BILITOT, ALBUMIN in the last 168 hours.  Cardiac Enzymes No results for input(s): TROPONINI, PROBNP in the last 168 hours.  Glucose  Recent Labs Lab 07/05/17 1128 07/05/17 1608 07/05/17 1925 07/05/17 2318 07/06/17 0331 07/06/17 0747  GLUCAP 211* 181* 163* 188* 190* 172*    Imaging Dg Chest Port 1 View  Result Date: 07/06/2017 CLINICAL DATA:  Acute respiratory failure EXAM: PORTABLE CHEST 1 VIEW COMPARISON:  07/04/2017 FINDINGS: Endotracheal tube removed. Central venous catheter tip in the SVC is unchanged. Left  lower lobe consolidation unchanged. Mild airspace disease right upper lobe unchanged. Probable left effusion unchanged. IMPRESSION: No significant change left lower lobe atelectasis/ infiltrate and left effusion. Mild right upper lobe airspace disease unchanged Endotracheal tube removed since the prior study. Electronically Signed   By: Franchot Gallo M.D.   On: 07/06/2017 07:28    STUDIES:  Abd u/s 8/14 >> GB sludge, hepatic steatosis CT abd/pelvis 8/15 >> mild colon wall thickening Echo 8/15 >> mild LVH, EF 55 to 60% Echo 8/25 >> EF 20 to 25%  CULTURES: Urine 8/14 >> Klebsiella, Enterococcus Blood  8/14 >> negative Urine 8/24 >> negative Sputum 8/24 >>  Sputum 8/25 >>few candida  Blood 8/25 >> few candida guilliefermondii   C-Diff 8/26 >> positive antigen, PCR positive  ANTIBIOTICS: Vancomycin 8/15 >> 8/23 Meropenem 8/15 >> 8/25 Fortaz 8/25 >> 8/27 Vancomycin 8/25 >> 8/27  PO vanco 8/27 >>  SIGNIFICANT EVENTS: 8/14 Admit 8/15 Off pressors 8/19 ETT 8/20 Self extubated 8/24 Pulled out Rt IJ CVL >> cardiac arrest, back to ICU 8/25 New fever 8/26 Extubated 8/27 C-diff positive 8/31. reintubated 9/3 goals of care conversation> one way extubation  LINES/TUBES: ETT 8/24 >> 8/26, 8/31> Lt IJ CVL 8/24 >>   DISCUSSION: 76 yo female initially admitted for septic shock from UTI.  Course complicated by HCAP and VDRF.  She then likely developed air embolism leading to cardiac arrest and respiratory failure after she removed her central line.  ASSESSMENT / PLAN:   Active Problems:   Elevated troponin   Septic shock (HCC)   Pressure injury of skin   Protein-calorie malnutrition, severe   Acute respiratory failure with hypoxia (HCC)   Urinary tract infection due to ESBL Klebsiella   Generalized weakness   Cardiopulmonary arrest with successful resuscitation [facility])   Encounter for palliative care   Goals of care, counseling/discussion  Discussion: Ms. Fennelly was extubated yesterday after discussion with palliative medicine and the PCCM team regarding goals of care. It was determined that they did not want to re-intubate the patient.  This morning her condition has declined further.  She appears to be actively dying with loud resonant secretions in her chest, some encephalopathy, and anasarca.    I discussed her situation this morning with her daughter [name] to let her know that I think she may die in the next 24 hours.    Plan: Move to the med surg floor Torsemide today for swelling NTS suction Morphine prn dyspnea  Roselie Awkward, MD [facility] Pager:  [phone] Cell: [phone] After 3pm or if no response, call [phone]

## 2017-07-07 DIAGNOSIS — R0600 Dyspnea, unspecified: Secondary | ICD-10-CM

## 2017-07-07 LAB — GLUCOSE, CAPILLARY
GLUCOSE-CAPILLARY: 144 mg/dL — AB (ref 65–99)
Glucose-Capillary: 146 mg/dL — ABNORMAL HIGH (ref 65–99)

## 2017-07-07 MED ORDER — GLYCOPYRROLATE 0.2 MG/ML IJ SOLN
0.2000 mg | INTRAMUSCULAR | Status: DC | PRN
  Administered 2017-07-07: 0.2 mg via INTRAVENOUS
  Filled 2017-07-07 (×3): qty 1

## 2017-07-07 NOTE — Progress Notes (Signed)
[name] overall state digressing, pt refusing O2. [name] daughter called to update on status. PRN morphine given for comfort.

## 2017-07-07 NOTE — Progress Notes (Signed)
PULMONARY / CRITICAL CARE MEDICINE   Name: Christy [name] MRN: [id number] DOB: 07-06-1941    ADMISSION DATE:  06/15/2017  REFERRING MD:  Dr. Sheran [name], [facility]  CHIEF COMPLAINT:  Bradycardia  BRIEF SUMMARY:   76 y/o female had knee surgery and was at SNF.  She was found to have weakness and RLQ discomfort.  She then had altered mental status, and was hypotensive.  She was tx for septic shock and UTI.  She developed respiratory failure from HCAP with concern for aspiration and required intubation.  She extubated herself, and was transferred to medical ward.  On 06/25/17 she was noted to have pulled her Rt IJ central line out.  She then developed bradycardia, hypotension, altered mental status and respiratory failure.  She required intubation by anesthesia.  After being transferred to ICU she developed asystole.  She required CPR.  She eventually regained pulse and blood pressure.  She also was able to follow simple commands.  PCCM asked to resume primary care while in ICU.  SUBJECTIVE:  Pulled out NGT overnight and refused replacement.  Awake this morning but per daughter, unable to recognize daughters and not making sense when trying to respond to questions.  Daughter states it is difficult to see her mother in this condition.  Agreeable to moving towards more of a comfort approach.  VITAL SIGNS: BP 136/64 (BP Location: Left Arm)   Pulse 82   Temp 98.1 F (36.7 C) (Oral)   Resp 18   Ht 5\' 7"  (1.702 m)   Wt 87.5 kg (192 lb 14.4 oz)   SpO2 97%   BMI 30.21 kg/m   VENTILATOR SETTINGS:    INTAKE / OUTPUT: I/O last 3 completed shifts: In: 1822 [I.V.:230; HQ/PR:9163; IV Piggyback:50] Out: 1960 [Urine:1960]   PHYSICAL EXAMINATION: General:  Chronically ill appearing, resting in bed, in NAD HENT: NCAT OP clear, slightly dry PULM: Shallow respirations though non-labored, intermittent coarseness CV: RRR, no mgr GI: infrequent bowel sounds MSK: normal bulk and tone Neuro: sleepy, will attempt  to mouth response when daughter asks questions (though daughter states when she does respond, it is often non-sensible).   LABS:  BMET  Recent Labs Lab 07/04/17 0402 07/05/17 0424 07/06/17 0500  NA 142 142 143  K 3.5 3.2* 3.1*  CL 104 103 105  CO2 28 31 31   BUN 35* 37* 30*  CREATININE 0.70 0.66 0.50  GLUCOSE 234* 215* 197*    Electrolytes  Recent Labs Lab 07/04/17 0402 07/05/17 0424 07/06/17 0500  CALCIUM 8.2* 8.1* 8.3*  MG 1.7 1.8 1.8  PHOS 2.7 3.1 3.4    CBC  Recent Labs Lab 07/04/17 0402 07/05/17 0424 07/06/17 0500  WBC 6.4 5.7 5.6  HGB 7.5* 7.6* 7.9*  HCT 23.7* 24.8* 25.7*  PLT 353 390 385    Coag's No results for input(s): APTT, INR in the last 168 hours.  Sepsis Markers No results for input(s): LATICACIDVEN, PROCALCITON, O2SATVEN in the last 168 hours.  ABG  Recent Labs Lab 07/02/17 1450  PHART 7.488*  PCO2ART 43.8  PO2ART 107    Liver Enzymes No results for input(s): AST, ALT, ALKPHOS, BILITOT, ALBUMIN in the last 168 hours.  Cardiac Enzymes No results for input(s): TROPONINI, PROBNP in the last 168 hours.  Glucose  Recent Labs Lab 07/06/17 1138 07/06/17 1656 07/06/17 2058 07/06/17 2351 07/07/17 0355 07/07/17 0745  GLUCAP 227* 186* 223* 186* 146* 144*    Imaging No results found.  STUDIES:  Abd u/s 8/14 >> GB sludge,  hepatic steatosis CT abd/pelvis 8/15 >> mild colon wall thickening Echo 8/15 >> mild LVH, EF 55 to 60% Echo 8/25 >> EF 20 to 25%  CULTURES: Urine 8/14 >> Klebsiella, Enterococcus Blood 8/14 >> negative Urine 8/24 >> negative Sputum 8/24 >>  Sputum 8/25 >>few candida  Blood 8/25 >> few candida guilliefermondii   C-Diff 8/26 >> positive antigen, PCR positive  ANTIBIOTICS: Vancomycin 8/15 >> 8/23 Meropenem 8/15 >> 8/25 Fortaz 8/25 >> 8/27 Vancomycin 8/25 >> 8/27  PO vanco 8/27 >>   SIGNIFICANT EVENTS: 8/14 Admit 8/15 Off pressors 8/19 ETT 8/20 Self extubated 8/24 Pulled out Rt IJ CVL >>  cardiac arrest, back to ICU 8/25 New fever 8/26 Extubated 8/27 C-diff positive 8/31 reintubated 9/3 goals of care conversation> one way extubation 9/4 > transferred to medical floor  LINES/TUBES: ETT 8/24 >> 8/26, 8/31>9/3 Lt IJ CVL 8/24 >> 8/31  DISCUSSION: 76 yo female initially admitted for septic shock from UTI.  Course complicated by HCAP and VDRF.  She then likely developed air embolism leading to cardiac arrest and respiratory failure after she removed her central line.  ASSESSMENT / PLAN:   Active Problems:   Elevated troponin   Septic shock (HCC)   Pressure injury of skin   Protein-calorie malnutrition, severe   Acute respiratory failure with hypoxia (HCC)   Urinary tract infection due to ESBL Klebsiella   Generalized weakness   Cardiopulmonary arrest with successful resuscitation [facility])   Encounter for palliative care   Goals of care, counseling/discussion  Discussion: Ms. Christy [name] was extubated 9/3 after discussion with palliative medicine and the PCCM team regarding goals of care. It was determined that they did not want to re-intubate the patient.  9/4 her condition has declined further.  She appears to be actively dying with loud resonant secretions in her chest, some encephalopathy, and anasarca.   Dr. Lake [name] discussed her situation 9/4 with her daughter Christy [name] to let her know that she may expire in the next 24 hours.   9/5 AM, she remains awake but per daughter is more encephalopathic and has shallower respirations.  Daughter finds it difficult to see her mother in this condition.  Plan: Will d/c all further labs / imaging NTS suction as needed Morphine prn dyspnea D/c all PO meds as unable to take PO and refusing replacement of NGT D/c tube feeds, free water Add robinul PRN   Move to more comfort approach.  Will ask TR[facility] to take over in AM 9/6 and PCCM off at that time.   Christy [name], [place] [facility] Pager: [phone]  or [phone] 07/07/2017, 9:41 AM

## 2017-07-07 NOTE — Care Management Important Message (Addendum)
Important Message  Patient Details IM Letter given to Nora/Case Manager to present to Patient Name: Fiana Gladu MRN: [id number] Date of Birth: 1941-08-19   Medicare Important Message Given:  Yes    Kerin Salen 07/07/2017, 10:43 AMImportant Message  Patient Details  Name: Reha Martinovich MRN: [id number] Date of Birth: [birth year]   Medicare Important Message Given:  Yes    Kerin Salen 07/07/2017, 10:43 AM

## 2017-07-07 NOTE — Progress Notes (Signed)
CSW following to assist with DC plan. Pt from LTC SNF at [facility], has had lengthy hospital course and continues to decline. Discussed with family and medical team and referral to residential hospice has been made. Family prefers [facility]. Will follow and assist with transfer to hospice care if appropriate.   Sharren Bridge, MSW, LCSW Clinical Social Work 07/07/2017 [phone]

## 2017-07-07 NOTE — Progress Notes (Signed)
Daily Progress Note   Patient Name: Christy [name]       Date: 07/07/2017 DOB: [birth year]  Age: 76 y.o. MRN#: [id number] Attending Physician: Marshell Garfinkel, MD Primary Care Physician: [name], MD Admit Date: 06/15/2017  Reason for Consultation/Follow-up: Inpatient hospice referral  Subjective: Eyes open, will track me in the room.  No family at bedside, call placed and discussed with daughter Christy [name], see below:    Length of Stay: 22  Current Medications: Scheduled Meds:  . Chlorhexidine Gluconate Cloth  6 each Topical Daily  . vancomycin  125 mg Oral QID    Continuous Infusions: . lactated ringers 10 mL/hr at 07/06/17 1300    PRN Meds: albuterol, glycopyrrolate, ipratropium-albuterol, morphine injection, ondansetron (ZOFRAN) IV  Physical Exam         Appears chronically ill Eyes open, tracks me in the room Shallow regular breath sounds Self d/c her NGT tube overnight S1 S2  Vital Signs: BP 136/64 (BP Location: Left Arm)   Pulse 82   Temp 98.1 F (36.7 C) (Oral)   Resp 18   Ht 5\' 7"  (1.702 m)   Wt 87.5 kg (192 lb 14.4 oz)   SpO2 97%   BMI 30.21 kg/m  SpO2: SpO2: 97 % O2 Device: O2 Device: Nasal Cannula O2 Flow Rate: O2 Flow Rate (L/min): 2 L/min  Intake/output summary:  Intake/Output Summary (Last 24 hours) at 07/07/17 1101 Last data filed at 07/07/17 0629  Gross per 24 hour  Intake           810.01 ml  Output             1535 ml  Net          -724.99 ml   LBM: Last BM Date: 07/06/17 Baseline Weight: Weight: 88 kg (194 lb 0.1 oz) Most recent weight: Weight: 87.5 kg (192 lb 14.4 oz)       Palliative Assessment/Data:      Patient Active Problem List   Diagnosis Date Noted  . Encounter for palliative care   . Goals of care,  counseling/discussion   . Cardiopulmonary arrest with successful resuscitation ([place])   . Urinary tract infection due to ESBL Klebsiella 06/23/2017  . Generalized weakness 06/23/2017  . Acute respiratory failure with hypoxia ([place])   . Protein-calorie malnutrition, severe 06/17/2017  .  Pressure injury of skin 06/16/2017  . Septic shock ([place]) 06/15/2017  . Acute kidney injury ([place])   . HCAP (healthcare-associated pneumonia) 05/05/2017  . Klebsiella pneumoniae infection 04/30/2017  . Nausea & vomiting   . UTI (urinary tract infection) 04/27/2017  . Constipation 04/27/2017  . Failure to thrive (0-17) 04/27/2017  . Hypokalemia 04/27/2017  . Moderate protein-calorie malnutrition ([place])   . Diabetes mellitus with complication ([place])   . OAB (overactive bladder) 04/22/2017  . S/P ORIF (open reduction internal fixation) fracture 04/22/2017  . Closed fracture of facial bone with routine healing 04/22/2017  . Anemia 04/22/2017  . Dehydration   . Displaced comminuted fracture of left patella, initial encounter for closed fracture 03/31/2017  . Elevated troponin   . Acute renal failure (ARF) ([place]) 03/29/2017  . Leukocytosis 03/29/2017  . Fall 03/29/2017  . Multiple fractures 03/28/2017  . Hyperlipidemia 12/27/2016  . Essential hypertension 12/27/2016  . Elevated serum creatinine 08/07/2014  . Elevated BUN 08/07/2014  . Diabetes ([place]) 08/07/2014  . Osteoporosis 08/07/2014  . Breast cancer of upper-outer quadrant of right female breast ([place]) 08/07/2013  . Abnormal EKG 08/21/2011    Palliative Care Assessment & Plan   Patient Profile:    Assessment:  prolonged hospitalization Recent VDRF and HCAP Recent air embolism leading to arrest.  Recent UTI, ESBL Klebsiella Severe protein calorie malnutrition Ongoing debility and decline   Recommendations/Plan:   Patient seen this am, discussed with CSW, discussed with Dr Lake Bells, call placed and discussed with daughter Christy [name]:   Family  is aware of the patient's current condition, they have discussed recently with CSW, daughter Christy [name] states that she is currently looking into either [facility] or [facility] [place] [place]. They wish to keep the patient as comfortable as possible. I briefly discussed with daughter Christy [name] about hospice philosophy of care.   Recommend transfer to hospice facility on discharge, patient has had a prolonged hospital course, has ongoing functional decline, it would be most prudent, in my opinion, at this stage to focus on keeping her as comfortable as possible. Recommend comfort feeds at hospice facility, if she is awake alert enough, recommend full scope of comfort measures and support for the family.    Code Status:    Code Status Orders        Start     Ordered   07/05/17 1151  Do not attempt resuscitation (DNR)  Continuous    Question Answer Comment  In the event of cardiac or respiratory ARREST Do not call a "code blue"   In the event of cardiac or respiratory ARREST Do not perform Intubation, CPR, defibrillation or ACLS   In the event of cardiac or respiratory ARREST Use medication by any route, position, wound care, and other measures to relive pain and suffering. May use oxygen, suction and manual treatment of airway obstruction as needed for comfort.      07/05/17 1150    Code Status History    Date Active Date Inactive Code Status Order ID Comments User Context   06/15/2017  3:21 PM 07/05/2017 11:50 AM Full Code [id number]  [name], NP ED   04/27/2017  4:14 PM 05/01/2017  3:15 PM Full Code [id number]  Rondel Jumbo, PA-C ED   03/28/2017  8:25 PM 04/06/2017  1:42 AM Full Code [id number]  Norval Morton, MD ED       Prognosis:   possibly less than 2 weeks.   Discharge Planning:  Hospice facility  Care plan was discussed with  Patient's daughter Christy [name] over the phone, CSW, Dr Lake Bells.   Thank you for allowing the Palliative Medicine Team to assist in the care of  this patient.   Time In:  10.30 Time Out: 11.05 Total Time 35 Prolonged Time Billed  no       Greater than 50%  of this time was spent counseling and coordinating care related to the above assessment and plan.  Loistine Chance, MD [phone]  Please contact Palliative Medicine Team phone at [phone] for questions and concerns.

## 2017-07-07 NOTE — Progress Notes (Signed)
[facility] Progress Note Patient Name: Christy [name] DOB: Mar 09, 1941 MRN: [id number]   Date of Service  07/07/2017  HPI/Events of Note  Pt pulled out her NGT Was on Tube feeds She is refusing replacement   Keep out and Tf off, re assess need in am  Vs SLP  eICU Interventions       Intervention Category Minor Interventions: Communication with other healthcare providers and/or family  Raylene Miyamoto. 07/07/2017, 4:06 AM

## 2017-07-07 NOTE — Progress Notes (Signed)
Pt pulled out NG tube. Tip intact. Pt bathed and linens changed.  CCMD paged.  Pt shaking head when discussing replacing tube.  Awaiting call back from CCMD for further orders at this time.

## 2017-07-07 NOTE — Progress Notes (Signed)
Nutrition Brief Note  Chart reviewed. Pt removed NGT. Pt now transitioning to comfort care per critical care note. No further nutrition interventions warranted at this time.   Clayton Bibles, MS, RD, LDN Pager: [phone] After Hours Pager: [phone]

## 2017-07-08 DIAGNOSIS — J9620 Acute and chronic respiratory failure, unspecified whether with hypoxia or hypercapnia: Secondary | ICD-10-CM

## 2017-07-08 MED ORDER — GLYCOPYRROLATE 0.2 MG/ML IJ SOLN: 0.2000 mg | INTRAMUSCULAR | Status: AC | PRN

## 2017-07-08 MED ORDER — ALBUTEROL SULFATE (2.5 MG/3ML) 0.083% IN NEBU: 2.5000 mg | INHALATION_SOLUTION | RESPIRATORY_TRACT | 12 refills | Status: AC | PRN

## 2017-07-08 MED ORDER — MORPHINE SULFATE (PF) 2 MG/ML IV SOLN: 2.0000 mg | INTRAVENOUS | 0 refills | Status: AC | PRN

## 2017-07-08 MED ORDER — IPRATROPIUM-ALBUTEROL 0.5-2.5 (3) MG/3ML IN SOLN: 3.0000 mL | RESPIRATORY_TRACT | Status: AC | PRN

## 2017-07-08 NOTE — Progress Notes (Signed)
PMT progress note  Patient appears chronically ill, she is resting in bed, her eyes are closed, does not appear to be in acute distress currently, no family at the bedside, I discussed in detail with her daughter [name] on 07-07-17.   BP 123/72 (BP Location: Left Arm)   Pulse 75   Temp 97.8 F (36.6 C) (Oral)   Resp 20   Ht 5\' 7"  (1.702 m)   Wt 87.5 kg (192 lb 14.4 oz)   SpO2 91%   BMI 30.21 kg/m  Pertinent labs and imaging noted  Weak chronically ill appearing Shallow regular S1 S2 Mild generalized edema Awakens, not currently as alert as she is noted to have been earlier  Medication history reviewed,patient has received 2 mg IV Morphine PRN times 4 in the past 24 hours.   A/P: Prolonged hospitalization Recent VDRF and HCAP Recent air embolism leading to arrest.  Recent UTI, ESBL Klebsiella Severe protein calorie malnutrition Ongoing debility and decline  Recommend residential hospice.  Recommend comfort feeds Continue current pain and non pain management medications Comfort measures Support for the family.   15 minutes spent [facility] palliative medicine team [phone]

## 2017-07-08 NOTE — Progress Notes (Signed)
Spoke with pt's daughters and with [facility]. Planning to meet with pt/family around 1pm today- CSW will follow and assist with transfer to [facility] if appropriate. Updated attending.  Sharren Bridge, MSW, LCSW Clinical Social Work 07/08/2017 [phone]

## 2017-07-08 NOTE — Progress Notes (Signed)
Report called to Barbaraann Rondo RN at hospice. All questions answered. Patient to be transported via ambulance.

## 2017-07-08 NOTE — Progress Notes (Signed)
Pt discharging today to transfer to [facility] for residential hospice care. Both daughters agreed to plan- at bedside and have completed admission paperwork with hospice liaison. Pt will transport via [facility] completed medical necessity form and arranged transport.  RN report [phone].  Sharren Bridge, MSW, LCSW Clinical Social Work 07/08/2017 [phone]

## 2017-07-08 NOTE — Discharge Summary (Signed)
[facility]  Physician Discharge Summary   Patient ID: Christy [name] MRN: [id number] DOB/AGE: Jun 08, 1941 76 y.o.  Admit date: 06/15/2017 Discharge date: 07/08/2017  PCP: [name], MD  DISCHARGE DIAGNOSES:  Active Problems:   Elevated troponin   Septic shock (HCC)   Pressure injury of skin   Protein-calorie malnutrition, severe   Acute respiratory failure with hypoxia (HCC)   Urinary tract infection due to ESBL Klebsiella   Generalized weakness   Cardiopulmonary arrest with successful resuscitation [facility])   Encounter for palliative care   Goals of care, counseling/discussion   Dyspnea   RECOMMENDATIONS FOR OUTPATIENT FOLLOW UP: 1. Patient being discharged to residential hospice in [facility].   DISCHARGE CONDITION: poor  Diet recommendation: Comfort feeds as tolerated.  Filed Weights   07/06/17 0340 07/06/17 1445 07/07/17 0411  Weight: 84.2 kg (185 lb 10 oz) 87 kg (191 lb 12.8 oz) 87.5 kg (192 lb 14.4 oz)    INITIAL HISTORY: 76 y.o. female from [place] currently residing in a nursing home after deconditioning following a knee surgery in June. Recently found to have an ESBL Klebsiella pneumonia urinary tract infection. Treated briefly with Bactrim intravenously which was subsequently converted to oral. Initially the patient did improve then was noted to have decreased level of consciousness and was hypotensive with systolic blood pressure in the 60-70s. Patient was found have a lactic acidosis as well as acute renal failure. Fluid challenge was administered per protocol. Cultures were obtained. Broad-spectrum antibiotics were subsequently started. She was admitted to the intensive care unit.  Consultations:  Critical care medicine  Palliative medicine   HOSPITAL COURSE:   Patient was hospitalized for management of septic shock, thought to be secondary to Klebsiella UTI. She required pressors in the intensive care unit. She was stabilized and then  transitioned to the floor. There was also evidence for healthcare associated pneumonia. She was given antibiotics. She also had acute kidney injury for which she was given IV fluids, and which corrected. CT scan of the abdomen suggested colitis. C. difficile testing was done which was positive and the patient was given oral vancomycin. She was also noted to have severe protein calorie malnutrition. Also noted to have stage I sacral ulcer and stage II lateral left heel ulcer.  Subsequently, patient developed respiratory distress and was transferred back to the intensive care unit. She was intubated. She self extubated the next day and was stabilized and then transferred back to the floor.  Subsequently, patient went into cardiac arrest after she pulled out her IJ central line. This was thought to be due to air embolization. She was once again transferred to the intensive care unit.  After prolonged stay in the intensive care unit, patient was not making significant progress. Palliative medicine was consulted. Discussions held with the family. One way extubation was done. Currently, only comfort care is being pursued. Plan is for the patient to be transferred to residential hospice.  Okay for transfer to residential hospice today.  PERTINENT LABS:  The results of significant diagnostics from this hospitalization (including imaging, microbiology, ancillary and laboratory) are listed below for reference.      Labs: Basic Metabolic Panel:  Recent Labs Lab 07/02/17 0420 07/03/17 0500 07/04/17 0402 07/05/17 0424 07/06/17 0500  NA 143 144 142 142 143  K 3.0* 3.0* 3.5 3.2* 3.1*  CL 102 106 104 103 105  CO2 36* 33* 28 31 31   GLUCOSE 196* 224* 234* 215* 197*  BUN 29* 34* 35* 37* 30*  CREATININE 0.62 0.75 0.70 0.66 0.50  CALCIUM 8.2* 8.3* 8.2* 8.1* 8.3*  MG  --  1.8 1.7 1.8 1.8  PHOS  --  1.4* 2.7 3.1 3.4   CBC:  Recent Labs Lab 07/02/17 0420 07/03/17 0500 07/04/17 0402 07/05/17 0424  07/06/17 0500  WBC 5.6 5.9 6.4 5.7 5.6  HGB 8.0* 8.0* 7.5* 7.6* 7.9*  HCT 26.1* 25.9* 23.7* 24.8* 25.7*  MCV 95.3 94.5 93.7 92.5 94.8  PLT 317 352 353 390 385   BNP: BNP (last 3 results)  Recent Labs  03/28/17 1645 06/15/17 1847 06/18/17 0500  BNP 14.0 418.7* 245.2*    CBG:  Recent Labs Lab 07/06/17 1656 07/06/17 2058 07/06/17 2351 07/07/17 0355 07/07/17 0745  GLUCAP 186* 223* 186* 146* 144*     IMAGING STUDIES Ct Abdomen Pelvis Wo Contrast  Result Date: 06/16/2017 CLINICAL DATA:  Inpatient. Abdominal pain, tenderness and guarding. Septic shock. EXAM: CT ABDOMEN AND PELVIS WITHOUT CONTRAST TECHNIQUE: Multidetector CT imaging of the abdomen and pelvis was performed following the standard protocol without IV contrast. COMPARISON:  06/15/2017 abdominal radiographs and abdominal sonogram. 04/27/2017 CT abdomen/pelvis. FINDINGS: Examination is limited by motion artifact and by streak artifact from the upper extremities. Lower chest: Dense consolidation with associated volume loss in the dependent lower lobes and lingula, favor atelectasis. Three-vessel coronary atherosclerosis. Tip of superior approach central venous catheter is seen at the cavoatrial junction. Healed deformities are again noted and lower right ribs. Hepatobiliary: Normal liver size. No gross liver mass. Mildly distended gallbladder. No gallbladder wall thickening. Subcentimeter minimally hyperdense focus in the dependent gallbladder may represent sludge or tiny gallstones. Dilated common bile duct (10 mm diameter), mildly increased from 7 mm on 04/27/2017. No evidence of obstructing biliary mass or radiopaque choledocholithiasis. Pancreas: Normal, with no mass or duct dilation. Spleen: Normal size. No mass. Adrenals/Urinary Tract: Stable 1.9 cm right adrenal and 2.7 cm left adrenal adenomas. Punctate nonobstructing stone versus vascular calcification in the posterior interpolar right renal sinus. No additional  potential renal stones. No hydronephrosis. No contour deforming renal masses. Limited bladder visualization due to streak artifact, with no gross bladder abnormality. Stomach/Bowel: Grossly normal stomach. Normal caliber small bowel with no small bowel wall thickening. Normal appendix. There is a moderate to large amount of stool throughout the large bowel and rectum. There is mild wall thickening in the splenic flexure of the colon, descending colon, sigmoid colon and rectum with associated pericolonic fat stranding and ill-defined fluid. No evidence of pneumatosis. No significant colonic diverticulosis. Vascular/Lymphatic: Atherosclerotic nonaneurysmal abdominal aorta. No pathologically enlarged lymph nodes in the abdomen or pelvis. Reproductive: Grossly normal uterus.  No adnexal mass. Other: Trace left paracolic gutter ascites. No focal fluid collection. No pneumoperitoneum. Mild anasarca. Musculoskeletal: No aggressive appearing focal osseous lesions. Partially visualized right total hip arthroplasty. Partially visualized surgical hardware in the bilateral wrists. Chronic mild L1 vertebral compression deformity is stable. Moderate thoracolumbar spondylosis. IMPRESSION: 1. Mild wall thickening in the splenic flexure, descending and sigmoid colon and rectum with associated pericolonic fat stranding, suggestive of a nonspecific infectious or inflammatory colitis, with the differential including C. diff colitis. No pneumatosis, free air or abscess. 2. Moderate to large colorectal stool volume. 3. Trace left paracolic gutter ascites. 4. Dense consolidation with associated volume loss in the dependent lung bases, favor atelectasis . 5. Mildly distended gallbladder with minimal sludge versus tiny gallstones. No gallbladder wall thickening. 6. Dilated common bile duct (10 mm diameter), mildly increased. No evidence of obstructing biliary mass or  radiopaque choledocholithiasis. Recommend correlation with serum bilirubin  levels. 7. Stable bilateral adrenal adenomas. 8.  Aortic Atherosclerosis (ICD10-I70.0). Electronically Signed   By: Ilona Sorrel M.D.   On: 06/16/2017 16:50   Dg Chest 1 View  Result Date: 06/20/2017 CLINICAL DATA:  Respiratory distress EXAM: CHEST 1 VIEW COMPARISON:  June 19, 2017 FINDINGS: No pneumothorax. Persistent infiltrate in the right upper lobe. Increased hazy opacity on the left. No other interval changes. Stable right IJ. IMPRESSION: 1. Stable infiltrate in the right upper lobe. 2. Increased hazy opacity throughout the left lung. Recommend clinical correlation and attention on follow-up. Electronically Signed   By: Dorise Bullion III M.D   On: 06/20/2017 11:56   Dg Chest 2 View  Result Date: 06/15/2017 CLINICAL DATA:  Recently treated urinary tract infection, hypotension, possible mental status change. History of breast malignancy. EXAM: CHEST  2 VIEW COMPARISON:  Chest x-ray of April 27, 2017 FINDINGS: There is mild hypoinflation of the right lung. The interstitial markings of both lungs are coarse. There is density at the left lung base likely in the lower lobe which appears new. The cardiac silhouette is enlarged. The pulmonary vascularity is not clearly engorged. There is tortuosity of the descending thoracic aorta with calcification in the wall. There are chronic changes of the right shoulder. IMPRESSION: Findings worrisome for atelectasis or pneumonia in the left lower lobe. Followup PA and lateral chest X-ray is recommended in 3-4 weeks following trial of antibiotic therapy to ensure resolution and exclude underlying malignancy. Stable cardiomegaly without pulmonary vascular congestion or pulmonary edema. Thoracic aortic atherosclerosis. Electronically Signed   By: David  Martinique M.D.   On: 06/15/2017 13:17   Dg Abd 1 View  Result Date: 06/28/2017 CLINICAL DATA:  Feeding tube placement. EXAM: ABDOMEN - 1 VIEW COMPARISON:  06/25/2017. FINDINGS: Feeding tube tip in the distal stomach.  Normal bowel gas pattern. Right hip prosthesis. Old, healed right rib fractures. Mildly displaced left seventh lateral rib fracture. IMPRESSION: 1. Feeding tube tip in the distal stomach. 2. Mildly displaced left lateral seventh rib fracture. Electronically Signed   By: Claudie Revering M.D.   On: 06/28/2017 13:20   Dg Abd 1 View  Result Date: 06/25/2017 CLINICAL DATA:  Orogastric placement 06/20/2017 EXAM: ABDOMEN - 1 VIEW COMPARISON:  None. FINDINGS: Orogastric tube enters the stomach, the tip passing at least to the gastric antrum. Far right side of the abdomen not included on the film. IMPRESSION: Orogastric tube tip at least in the gastric antrum. Electronically Signed   By: Nelson Chimes M.D.   On: 06/25/2017 14:51   Dg Abd 1 View  Result Date: 06/20/2017 CLINICAL DATA:  76 year old female with a history of nasogastric tube placement EXAM: ABDOMEN - 1 VIEW COMPARISON:  CT 06/16/2017 FINDINGS: Limited plain film of the abdomen. Gastric tube courses over the low mediastinum in the upper abdomen, with the tip of the gastric tube terminating in the peripyloric region of the stomach. Gas within transverse colon, partially distended, similar to the comparison. Formed stool within the left colon. IMPRESSION: Limited plain film demonstrates gastric tube terminating in the peripyloric region of the stomach. Unchanged appearance of gaseous distention of transverse colon and formed stool in the left colon, similar prior CT. Electronically Signed   By: Corrie Mckusick D.O.   On: 06/20/2017 14:00   US Abdomen Complete  Result Date: 06/15/2017 CLINICAL DATA:  Abdominal pain with nausea and vomiting EXAM: ABDOMEN ULTRASOUND COMPLETE COMPARISON:  Radiograph 04/29/2017, ultrasound 04/28/2017, CT 04/27/2017  FINDINGS: Gallbladder: Small echogenic foci in the gallbladder measuring up to 11 mm but without much shadowing, could represent small amount of sludge or small stones. Negative sonographic Murphy. Normal wall  thickness. Common bile duct: Diameter: Slightly enlarged, measuring up to 1 cm, but similar compared with prior ultrasound from June. Liver: Increased echogenicity.  No focal abnormality is seen. IVC: Limited visualization.  No significant abnormality. Pancreas: Visualized portion unremarkable. Spleen: Size and appearance within normal limits. Right Kidney: Length: 12.9 cm. Echogenicity within normal limits. No mass or hydronephrosis visualized. Left Kidney: Length: 12 cm. Echogenicity within normal limits. No mass or hydronephrosis visualized. Abdominal aorta: Proximal aorta 2.4 cm. Mid to distal aorta obscured by bowel gas Other findings: None. IMPRESSION: 1. Small linear echogenic focus in the gallbladder could relate to a small amount of sludge or possible small stones. Normal wall thickness and negative sonographic Murphy. 2. Slightly enlarged common bowel duct up to 1 cm but without change since prior ultrasound from June 2018. Correlation with laboratory values previously suggested 3. Hepatic steatosis Electronically Signed   By: Donavan Foil M.D.   On: 06/15/2017 18:05   Dg Chest Port 1 View  Result Date: 07/06/2017 CLINICAL DATA:  Acute respiratory failure EXAM: PORTABLE CHEST 1 VIEW COMPARISON:  07/04/2017 FINDINGS: Endotracheal tube removed. Central venous catheter tip in the SVC is unchanged. Left lower lobe consolidation unchanged. Mild airspace disease right upper lobe unchanged. Probable left effusion unchanged. IMPRESSION: No significant change left lower lobe atelectasis/ infiltrate and left effusion. Mild right upper lobe airspace disease unchanged Endotracheal tube removed since the prior study. Electronically Signed   By: Franchot Gallo M.D.   On: 07/06/2017 07:28   Dg Chest Port 1 View  Result Date: 07/04/2017 CLINICAL DATA:  Acute respiratory failure.  Ex-smoker. EXAM: PORTABLE CHEST 1 VIEW COMPARISON:  07/03/2017. FINDINGS: Endotracheal tube in satisfactory position. Feeding tube  extending into the stomach. Left jugular catheter tip in the superior vena cava. Stable enlarged cardiac silhouette and mildly prominent interstitial markings. No significant change in left basilar airspace opacity, including dense left lower lobe opacity. Improved aeration at the right lung base. Thoracic spine degenerative changes. IMPRESSION: 1. Stable left basilar atelectasis or pneumonia. 2. Resolved right basilar atelectasis. 3. Stable cardiomegaly and mild chronic interstitial lung disease. Electronically Signed   By: Claudie Revering M.D.   On: 07/04/2017 07:13   Dg Chest Port 1 View  Result Date: 07/03/2017 CLINICAL DATA:  Respiratory failure EXAM: PORTABLE CHEST 1 VIEW COMPARISON:  07/02/2017 FINDINGS: Endotracheal tube is 1.7 cm above the carina. Enteric tube extends into the stomach and beyond the inferior edge of the image. Left jugular central line extends into the low SVC. No pneumothorax. Persistent consolidation in the left base. Probable pleural effusions bilaterally. Partial clearance of upper lobe airspace opacities bilaterally. IMPRESSION: 1. Satisfactorily positioned support equipment. 2. Partial clearance of upper lobe opacities. Persistent left base consolidation. Small effusions. Electronically Signed   By: Andreas Newport M.D.   On: 07/03/2017 05:44   Dg Chest Port 1 View  Result Date: 07/02/2017 CLINICAL DATA:  Acute respiratory distress.  ET tube placement. EXAM: PORTABLE CHEST 1 VIEW COMPARISON:  Chest x-ray from same day. FINDINGS: Interval placement of an endotracheal tube with the tip approximately 3.2 cm above the level of the carina. Feeding tube and left internal jugular central venous catheter are unchanged in position. Stable cardiomegaly. Left-sided pleural effusion appears decreased in size. Left basilar atelectasis. No pneumothorax. IMPRESSION: 1. Endotracheal tube in  good position with the tip approximately 3.2 cm above the level of the carina. 2. Decrease in size of  left pleural effusion. Electronically Signed   By: Titus Dubin M.D.   On: 07/02/2017 14:24   Dg Chest Port 1 View  Result Date: 07/02/2017 CLINICAL DATA:  Respiratory failure. EXAM: PORTABLE CHEST 1 VIEW COMPARISON:  05/01/2017. FINDINGS: Left IJ line and feeding tube in stable position. Stable cardiomegaly and mild interstitial prominence. Faint density projected over the right upper lung may represent a fluid pseudotumor. Progressive left-sided pleural effusion. No pneumothorax. Stable splenic artery vascular calcification. Surgical clips are noted over the right chest. IMPRESSION: 1. Lines and tubes in stable position. 2. Persistent cardiomegaly with bilateral interstitial prominence and left-sided pleural effusion suggesting CHF. Left-sided pleural effusion has increased from prior exam. Fluid pseudotumor in the right major fissure cannot be excluded. Electronically Signed   By: Marcello Moores  Register   On: 07/02/2017 06:58   Dg Chest Port 1 View  Result Date: 07/01/2017 CLINICAL DATA:  Respiratory failure EXAM: PORTABLE CHEST 1 VIEW COMPARISON:  06/29/2017 FINDINGS: Support devices are stable. Cardiomegaly. Small bilateral pleural effusions, left greater than right. Bibasilar atelectasis. Mild vascular congestion. IMPRESSION: Continued small effusions with bibasilar atelectasis and vascular congestion. No real change. Electronically Signed   By: Rolm Baptise M.D.   On: 07/01/2017 07:10   Dg Chest Port 1 View  Result Date: 06/29/2017 CLINICAL DATA:  Respiratory failure, hypoxia, sepsis, cardiopulmonary arrest with successful resuscitation. EXAM: PORTABLE CHEST 1 VIEW COMPARISON:  Portable chest x-ray of June 27, 2017 FINDINGS: The lungs are less well inflated since extubation of the trachea. The interstitial markings are coarse. The retrocardiac region on the left is dense. Small amounts of pleural fluid blunt the costophrenic angles. The cardiac silhouette is enlarged. The pulmonary vascularity is  engorged. There is calcification in the wall of the aortic arch. The left internal jugular venous catheter tip projects over the proximal to mid SVC. The feeding tube tip projects below the inferior margin of the image. IMPRESSION: CHF with mild interstitial edema and small bilateral pleural effusions. Left lower lobe atelectasis or pneumonia. Thoracic aortic atherosclerosis. Electronically Signed   By: David  Martinique M.D.   On: 06/29/2017 07:13   Dg Chest Port 1 View  Result Date: 06/27/2017 CLINICAL DATA:  Respiratory failure.  Breast cancer. EXAM: PORTABLE CHEST 1 VIEW COMPARISON:  06/26/2017. FINDINGS: Support tubes and lines stable. Cardiomegaly. Small effusions and basilar atelectasis redemonstrated. IMPRESSION: Stable chest. Electronically Signed   By: Staci Righter M.D.   On: 06/27/2017 07:10   Dg Chest Port 1 View  Result Date: 06/26/2017 CLINICAL DATA:  Respiratory failure (HCC) Hx HTN, right breast cancer, diabetes, former smoker, portacath. EXAM: PORTABLE CHEST - 1 VIEW COMPARISON:  the previous day's study FINDINGS: Endotracheal tube, nasogastric tube, and left IJ central line stable in position. Somewhat coarse interstitial opacities, slightly improved on the right since previous exam. Heart size and mediastinal contours are within normal limits. Tortuous thoracic aorta. No effusion.  No pneumothorax. Visualized bones unremarkable. IMPRESSION: 1. Improved right lung aeration since previous exam. 2.  Support hardware stable in position. Electronically Signed   By: Lucrezia Europe M.D.   On: 06/26/2017 11:20   Dg Chest Port 1 View  Result Date: 06/25/2017 CLINICAL DATA:  76 year old female status post central line placement. EXAM: PORTABLE CHEST 1 VIEW COMPARISON:  Chest radiograph from earlier the same day. FINDINGS: An endotracheal tube terminates approximately 4.5 cm above the carina. Nasogastric  tube courses below the diaphragm the on the edge of the film. There has been interval placement of a  left IJ central venous catheter terminating at the proximal to mid SVC. Evaluation of the chest slightly limited due to patient rotation. There is suggestion of increased opacity in the visualized right lung, though this may be positional. No sizable pleural effusion or pneumothorax. No acute osseous abnormalities. IMPRESSION: 1. Interval placement of a left IJ central venous catheter terminating at the proximal/mid SVC. 2. Limited examination due to patient rotation with questionable increased parenchymal opacity at the right lung. Electronically Signed   By: Kristopher Oppenheim M.D.   On: 06/25/2017 12:51   Dg Chest Port 1 View  Result Date: 06/25/2017 CLINICAL DATA:  Dyspnea EXAM: PORTABLE CHEST 1 VIEW COMPARISON:  06/22/2017 FINDINGS: Mild bilateral interstitial thickening similar in appearance to multiple prior exams. No pleural effusion or pneumothorax. No focal consolidation. Right jugular central venous catheter with the tip projecting over the cavoatrial junction. Pole posttraumatic deformity of the right proximal humerus. IMPRESSION: No active cardiopulmonary disease. Electronically Signed   By: Kathreen Devoid   On: 06/25/2017 09:35   Dg Chest Port 1 View  Result Date: 06/22/2017 CLINICAL DATA:  Acute respiratory failure with hypoxia. Sepsis. History of breast malignancy, hyperlipidemia, hypertension, former smoker. EXAM: PORTABLE CHEST 1 VIEW COMPARISON:  Portable chest x-ray of June 20, 2017 FINDINGS: The lungs are reasonably well inflated. Persistent interstitial infiltrates are present in the upper lobes with hazy density at both bases. A small amount of pleural fluid on the right is suspected. The cardiac silhouette is enlarged. The pulmonary vascularity is prominent. The trachea and esophagus have been extubated. The right internal jugular venous catheter tip projects over the distal third of the SVC. IMPRESSION: Persistent bilateral pneumonia. Probable small right pleural effusion. Cardiomegaly  without definite pulmonary edema. Electronically Signed   By: David  Martinique M.D.   On: 06/22/2017 07:27   Dg Chest Port 1 View  Result Date: 06/20/2017 CLINICAL DATA:  Encounter for intubation. EXAM: PORTABLE CHEST 1 VIEW COMPARISON:  06/20/2017 FINDINGS: Endotracheal tube tip is at the orifice of the right mainstem bronchus. 2-3 cm of retraction suggested. An orogastric tube reaches the stomach at least. Right IJ central line with tip at the upper cavoatrial junction. Better lung volumes. Asymmetric airspace opacity consistent with pneumonia in this patient with history of septic shock. No effusion or pneumothorax. Stable heart size. These results were called by telephone at the time of interpretation on 06/20/2017 at 1:59 pm to RN Sarah who verbally acknowledged these results. IMPRESSION: 1. Endotracheal tube tip at the right mainstem bronchus, suggest 2-3 cm of tube retraction. 2. New orogastric tube reaches the stomach. 3. Bilateral pneumonia.  Improved lung volumes. Electronically Signed   By: Monte Fantasia M.D.   On: 06/20/2017 14:00   Dg Chest Port 1 View  Result Date: 06/19/2017 CLINICAL DATA:  Acute onset of respiratory failure. Vomiting. Initial encounter. EXAM: PORTABLE CHEST 1 VIEW COMPARISON:  Chest radiograph performed 06/18/2017 FINDINGS: The lungs are hypoexpanded. There is persistent right apical airspace opacity, and increasing left apical and left basilar airspace opacities, concerning for multifocal pneumonia. No pleural effusion or pneumothorax is seen. The cardiomediastinal silhouette is mildly enlarged. No acute osseous abnormalities are identified. A right IJ line is noted ending about the distal SVC. IMPRESSION: 1. Lungs hypoexpanded. Persistent right apical airspace opacity, and increasing left apical and left basilar airspace opacities, concerning for worsening multifocal pneumonia. 2. Mild cardiomegaly.  Electronically Signed   By: Garald Balding M.D.   On: 06/19/2017 04:36   Dg  Chest Port 1 View  Result Date: 06/18/2017 CLINICAL DATA:  Respiratory failure. EXAM: PORTABLE CHEST 1 VIEW COMPARISON:  06/17/2017. FINDINGS: Right IJ catheter again noted with tip in the right atrium. Heart size stable. Persistent right upper lobe infiltrate, progressed from prior exam. Findings consist with pneumonia. Mild left base subsegmental atelectasis. No pleural effusion pneumothorax. Surgical clips right chest. IMPRESSION: 1. Right IJ catheter again noted with tip in the right atrium . 2. Progressive right upper lobe infiltrate consistent with pneumonia. 3. Mild left base subsegmental atelectasis. Electronically Signed   By: Marcello Moores  Register   On: 06/18/2017 07:07   Dg Chest Port 1 View  Result Date: 06/17/2017 CLINICAL DATA:  Respiratory failure. EXAM: PORTABLE CHEST 1 VIEW COMPARISON:  06/15/2017. FINDINGS: Right IJ line noted with its tip projected over the right atrium. Heart size normal. Right upper lobe mild infiltrate. Progressive left lower lobe atelectasis. Tiny left pleural effusion cannot be excluded . No pneumothorax. Surgical clips right chest. IMPRESSION: 1. Right IJ line noted with its tip over the right atrium. No pneumothorax. 2. New onset mild right upper lobe infiltrate suggesting pneumonia. Progressive lower lobe atelectasis. Electronically Signed   By: Marcello Moores  Register   On: 06/17/2017 06:56   Dg Chest Port 1 View  Result Date: 06/15/2017 CLINICAL DATA:  Sepsis. EXAM: PORTABLE ABDOMEN - 1 VIEW; PORTABLE CHEST - 1 VIEW COMPARISON:  Chest x-ray 06/15/2017 and abdominal films 04/27/2017 FINDINGS: The portable semi upright chest film demonstrates normal heart size. Stable tortuosity and calcification of the thoracic aorta. Right IJ center venous catheter tip is in the distal SVC. Low lung volumes with vascular crowding and streaky basilar atelectasis. No definite effusions or infiltrates Two views of the abdomen demonstrate air filled colon with scattered stool down to the  rectum. No findings for small bowel obstruction or free air. The soft tissue shadows are grossly maintained. Stable vascular calcifications. IMPRESSION: No acute cardiopulmonary findings. Low lung volumes with vascular crowding and streaky bibasilar atelectasis. No plain film findings for an acute abdominal process. Probable mild colonic ileus and constipation. Electronically Signed   By: Marijo Sanes M.D.   On: 06/15/2017 18:27   Dg Chest Port 1v Same Day  Result Date: 06/25/2017 CLINICAL DATA:  Status post intubation EXAM: PORTABLE CHEST 1 VIEW COMPARISON:  06/25/2018 FINDINGS: Endotracheal tube with the tip 4.7 cm above the carina. Nasogastric tube coursing below the diaphragm. Bilateral diffuse mild interstitial thickening. No pleural effusion or pneumothorax. Stable cardiomegaly. No acute osseous abnormality. Old posttraumatic deformity of the right proximal humerus. IMPRESSION: 1. Endotracheal tube with the tip 4.7 cm above the carina. Electronically Signed   By: Kathreen Devoid   On: 06/25/2017 11:52   Dg Abd Portable 1v  Result Date: 06/15/2017 CLINICAL DATA:  Sepsis. EXAM: PORTABLE ABDOMEN - 1 VIEW; PORTABLE CHEST - 1 VIEW COMPARISON:  Chest x-ray 06/15/2017 and abdominal films 04/27/2017 FINDINGS: The portable semi upright chest film demonstrates normal heart size. Stable tortuosity and calcification of the thoracic aorta. Right IJ center venous catheter tip is in the distal SVC. Low lung volumes with vascular crowding and streaky basilar atelectasis. No definite effusions or infiltrates Two views of the abdomen demonstrate air filled colon with scattered stool down to the rectum. No findings for small bowel obstruction or free air. The soft tissue shadows are grossly maintained. Stable vascular calcifications. IMPRESSION: No acute cardiopulmonary findings. Low  lung volumes with vascular crowding and streaky bibasilar atelectasis. No plain film findings for an acute abdominal process. Probable mild  colonic ileus and constipation. Electronically Signed   By: Marijo Sanes M.D.   On: 06/15/2017 18:27   Dg Swallowing Func-speech Pathology  Result Date: 06/29/2017 Objective Swallowing Evaluation: Type of Study: MBS-Modified Barium Swallow Study Patient Details Name: Denym Christenberry MRN: [id number] Date of Birth: April 30, 1941 Today's Date: 06/29/2017 Time: SLP Start Time (ACUTE ONLY): 1203-SLP Stop Time (ACUTE ONLY): 1223 SLP Time Calculation (min) (ACUTE ONLY): 20 min Past Medical History: Past Medical History: Diagnosis Date . Abnormal ECG   Inferior Q waves . Cancer ([place])   stage II right breast cancer . Cataract  . Diabetes mellitus  . Hypercholesterolemia  . Hyperlipidemia  . Hypertension  . Osteoporosis  . Overactive bladder  Past Surgical History: Past Surgical History: Procedure Laterality Date . BREAST SURGERY    right lumpectomy, sentinel node biopsy . CATARACT EXTRACTION Bilateral 2008 . FRACTURE SURGERY    ORIF right distal radius fx . JOINT REPLACEMENT    right hip . ORIF RADIAL FRACTURE Bilateral 03/29/2017  Procedure: OPEN REDUCTION INTERNAL FIXATION (ORIF) RADIAL FRACTURES;  Surgeon: Iran Planas, MD;  Location: [facility];  Service: Orthopedics;  Laterality: Bilateral; . PATELLECTOMY Left 03/31/2017  Procedure: LEFT PARTIAL PATELLECTOMY;  Surgeon: Leandrew Koyanagi, MD;  Location: [facility];  Service: Orthopedics;  Laterality: Left; . PORT-A-CATH REMOVAL  09/11/2011  Procedure: REMOVAL PORT-A-CATH;  Surgeon: Rolm Bookbinder, MD;  Location: [facility];  Service: General;  Laterality: Left;  local  . PORTACATH PLACEMENT   HPI: 76 yo female adm to [facility] with AMS from SNF on 06/15/17.  Pt developed respiratory deficits and was intubated on 8/19- self extubated 06/21/17. Pt with PMH + for former smoker, breast malignancy, knee surgery with post op deconditioning, UTI. Pt speaks [name]. Initial swallow assessment 06/22/17 started Dys 2, nectar, decreased in status and made NPO after ICU transfer and  asystole. Pt appropriate for MBS.   Subjective: pt awake in bed Assessment / Plan / Recommendation CHL IP CLINICAL IMPRESSIONS 06/29/2017 Clinical Impression Moderately decreased oral manipulation including lingual pumping, lingual residue spilling over tonuge base and delayed transit. Pharyngeal phase mainly impacted by decreased sensation with slow swallow initiation at the valleculae and pyriform sinuses aiding in silent aspiration with cup sip nectar. Initially cued throat clear removed majority of material from larygneal vestibule but unsuccessful the remainder of study to clear vocal cords and laryngeal vestibule. Pt obviously fatigued and stating she was unable when asked (by daughter) to produce additional swallow or cough. Moderate vallecular and pyriform sinus retention not cleared. Aspiration risk is very high at this time with all consistencies. Recommend continue NPO with NGT, several ice chips PRN following oral care. Overall prognosis unknown to this SLP for potential for swallow improvement. She appears very deconditioned. Will follow along for plan re: nutrition; improvements with repeat MBS if/when appropriate versus comfort feeds.    SLP Visit Diagnosis Dysphagia, oropharyngeal phase (R13.12) Attention and concentration deficit following -- Frontal lobe and executive function deficit following -- Impact on safety and function (No Data)   CHL IP TREATMENT RECOMMENDATION 06/29/2017 Treatment Recommendations Therapy as outlined in treatment plan below   Prognosis 06/29/2017 Prognosis for Safe Diet Advancement Fair Barriers to Reach Goals -- Barriers/Prognosis Comment -- CHL IP DIET RECOMMENDATION 06/29/2017 SLP Diet Recommendations Ice chips PRN after oral care;NPO Liquid Administration via -- Medication Administration Via alternative means Compensations -- Postural Changes --  CHL IP OTHER RECOMMENDATIONS 06/29/2017 Recommended Consults -- Oral Care Recommendations Oral care QID Other Recommendations --    CHL IP FOLLOW UP RECOMMENDATIONS 06/29/2017 Follow up Recommendations Skilled Nursing facility   [facility] IP FREQUENCY AND DURATION 06/29/2017 Speech Therapy Frequency (ACUTE ONLY) min 1 x/week Treatment Duration 2 weeks      CHL IP ORAL PHASE 06/29/2017 Oral Phase Impaired Oral - Pudding Teaspoon -- Oral - Pudding Cup -- Oral - Honey Teaspoon Delayed oral transit Oral - Honey Cup Delayed oral transit Oral - Nectar Teaspoon Delayed oral transit;Lingual/palatal residue Oral - Nectar Cup Lingual/palatal residue;Delayed oral transit;Lingual pumping Oral - Nectar Straw -- Oral - Thin Teaspoon -- Oral - Thin Cup -- Oral - Thin Straw -- Oral - Puree Delayed oral transit Oral - Mech Soft -- Oral - Regular -- Oral - Multi-Consistency -- Oral - Pill -- Oral Phase - Comment --  CHL IP PHARYNGEAL PHASE 06/29/2017 Pharyngeal Phase Impaired Pharyngeal- Pudding Teaspoon -- Pharyngeal -- Pharyngeal- Pudding Cup -- Pharyngeal -- Pharyngeal- Honey Teaspoon -- Pharyngeal -- Pharyngeal- Honey Cup -- Pharyngeal -- Pharyngeal- Nectar Teaspoon Delayed swallow initiation-vallecula;Pharyngeal residue - pyriform;Pharyngeal residue - valleculae Pharyngeal Material does not enter airway Pharyngeal- Nectar Cup Penetration/Aspiration during swallow;Delayed swallow initiation-vallecula;Delayed swallow initiation-pyriform sinuses;Pharyngeal residue - valleculae;Pharyngeal residue - pyriform;Reduced epiglottic inversion Pharyngeal Material enters airway, passes BELOW cords without attempt by patient to eject out (silent aspiration) Pharyngeal- Nectar Straw -- Pharyngeal -- Pharyngeal- Thin Teaspoon -- Pharyngeal -- Pharyngeal- Thin Cup -- Pharyngeal -- Pharyngeal- Thin Straw -- Pharyngeal -- Pharyngeal- Puree Pharyngeal residue - pyriform;Pharyngeal residue - valleculae;Reduced epiglottic inversion Pharyngeal -- Pharyngeal- Mechanical Soft -- Pharyngeal -- Pharyngeal- Regular -- Pharyngeal -- Pharyngeal- Multi-consistency -- Pharyngeal -- Pharyngeal-  Pill -- Pharyngeal -- Pharyngeal Comment --  CHL IP CERVICAL ESOPHAGEAL PHASE 06/29/2017 Cervical Esophageal Phase WFL Pudding Teaspoon -- Pudding Cup -- Honey Teaspoon -- Honey Cup -- Nectar Teaspoon -- Nectar Cup -- Nectar Straw -- Thin Teaspoon -- Thin Cup -- Thin Straw -- Puree -- Mechanical Soft -- Regular -- Multi-consistency -- Pill -- Cervical Esophageal Comment -- No flowsheet data found. [name] 06/29/2017, 2:10 PM  Orbie Pyo Colvin Caroli.Ed CCC-SLP Pager [phone]              DISCHARGE EXAMINATION: Vitals:   07/07/17 0629 07/07/17 1200 07/07/17 1651 07/08/17 0618  BP:   (!) 116/54 123/72  Pulse: 82  74 75  Resp:   (!) 21 20  Temp:   98.5 F (36.9 C) 97.8 F (36.6 C)  TempSrc:   Axillary Oral  SpO2: 97% 92% 93% 91%  Weight:      Height:       General appearance: alert, cooperative and no distress Resp: Coarse breath sounds bilaterally with diminished air entry at the bases. No wheezing. Normal effort. Cardio: regular rate and rhythm, S1, S2 normal, no murmur, click, rub or gallop  DISPOSITION: Residential hospice  Discharge Instructions    Increase activity slowly    Complete by:  As directed       ALLERGIES: No Known Allergies   Current Discharge Medication List    START taking these medications   Details  albuterol (PROVENTIL) (2.5 MG/3ML) 0.083% nebulizer solution Take 3 mLs (2.5 mg total) by nebulization every 4 (four) hours as needed for wheezing or shortness of breath. Qty: 75 mL, Refills: 12    glycopyrrolate (ROBINUL) 0.2 MG/ML injection Inject 1 mL (0.2 mg total) into the vein every 4 (four) hours as needed (  excessive secretions). Qty: 1 mL    ipratropium-albuterol (DUONEB) 0.5-2.5 (3) MG/3ML SOLN Take 3 mLs by nebulization every 2 (two) hours as needed. Qty: 360 mL    morphine 2 MG/ML injection Inject 1-2 mLs (2-4 mg total) into the vein every 3 (three) hours as needed (or dyspnea). Qty: 1 mL, Refills: 0      CONTINUE these medications  which have NOT CHANGED   Details  acetaminophen (TYLENOL) 325 MG tablet Take 2 tablets (650 mg total) by mouth every 6 (six) hours as needed for mild pain. Qty: 120 tablet, Refills: 0      STOP taking these medications     amLODipine (NORVASC) 10 MG tablet      aspirin EC 81 MG tablet      atorvastatin (LIPITOR) 20 MG tablet      bisacodyl (DULCOLAX) 10 MG suppository      bisacodyl (DULCOLAX) 5 MG EC tablet      Cholecalciferol (VITAMIN D) 2000 units tablet      citalopram (CELEXA) 20 MG tablet      docusate sodium (COLACE) 100 MG capsule      furosemide (LASIX) 40 MG tablet      glimepiride (AMARYL) 2 MG tablet      insulin detemir (LEVEMIR) 100 UNIT/ML injection      lidocaine (LIDODERM) 5 %      lisinopril (PRINIVIL,ZESTRIL) 40 MG tablet      magnesium hydroxide (MILK OF MAGNESIA) 400 MG/5ML suspension      metFORMIN (GLUCOPHAGE-XR) 500 MG 24 hr tablet      metoCLOPramide (REGLAN) 5 MG tablet      mirtazapine (REMERON) 15 MG tablet      ondansetron (ZOFRAN) 4 MG tablet      potassium chloride SA (K-DUR,KLOR-CON) 20 MEQ tablet      promethazine (PHENERGAN) 25 MG/ML injection      senna (SENOKOT) 8.6 MG TABS tablet      sulfamethoxazole-trimethoprim (BACTRIM DS,SEPTRA DS) 800-160 MG tablet      UNABLE TO FIND      fenofibrate (TRICOR) 48 MG tablet      solifenacin (VESICARE) 10 MG tablet           TOTAL DISCHARGE TIME: 35 minutes  [facility] Pager [phone]  07/08/2017, 11:23 AM

## 2017-08-02 DEATH — deceased

## 2018-09-07 IMAGING — CT CT ABD-PELV W/O CM
2 of 4 series · 15 of 46 positions shown, 17 images · non-contrast
Comparison: 06/15/2017 abdominal radiographs and abdominal
sonogram. 04/27/2017 CT abdomen/pelvis.

CLINICAL DATA: Inpatient. Abdominal pain, tenderness and guarding.
Septic shock.

EXAM:
CT ABDOMEN AND PELVIS WITHOUT CONTRAST
TECHNIQUE: Multidetector CT imaging of the abdomen and pelvis was performed
following the standard protocol without IV contrast.

[Series 2: axial st · axial · 0.86mm/px · z∈[-594,-134]mm · 12 of 104 slices shown, 14 images]
[im 6/104  soft-tissue]
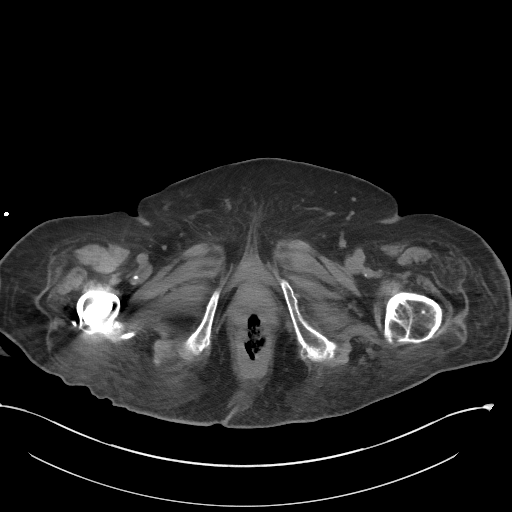
[im 6/104  bone]
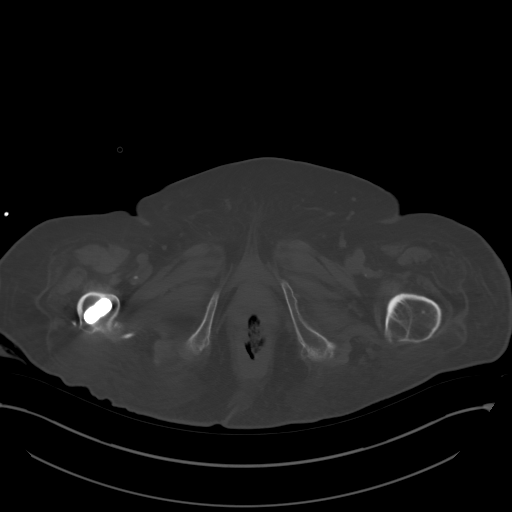
[im 16/104  soft-tissue]
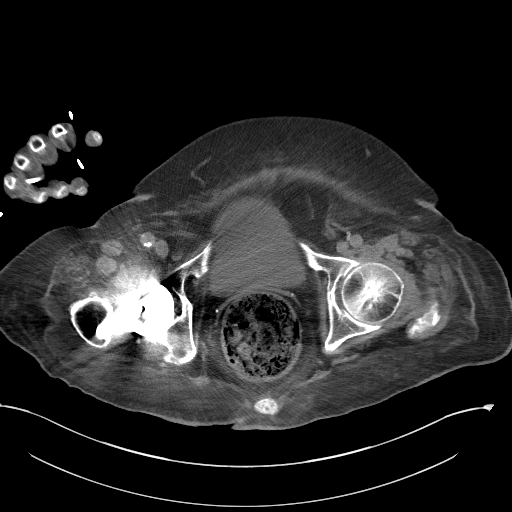
[im 21/104  soft-tissue]
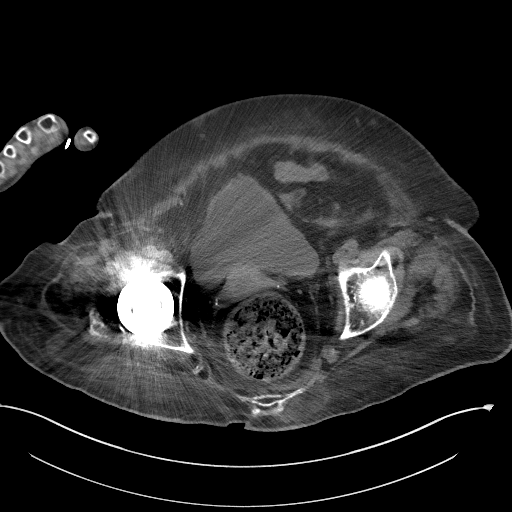
[im 31/104  soft-tissue]
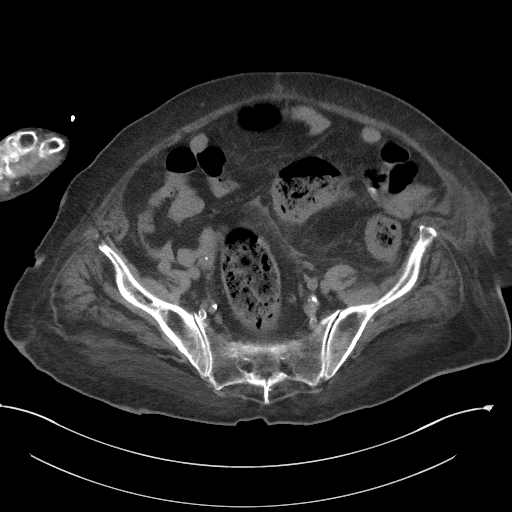
[im 42/104  soft-tissue]
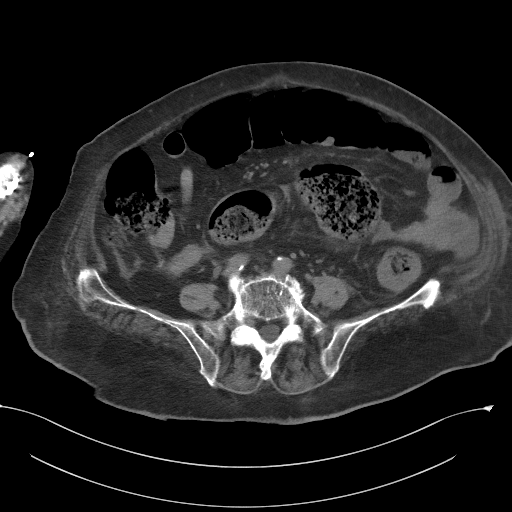
[im 47/104  soft-tissue]
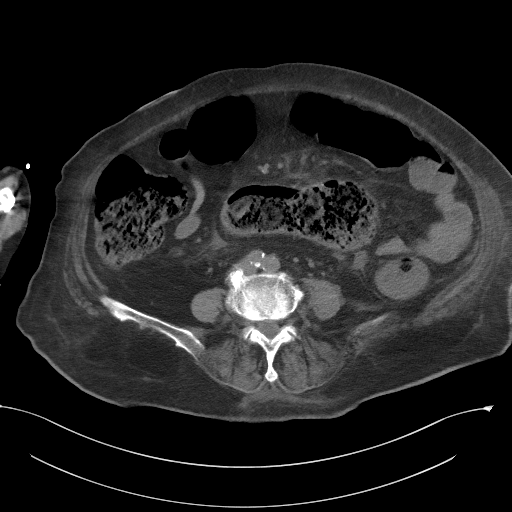
[im 57/104  soft-tissue]
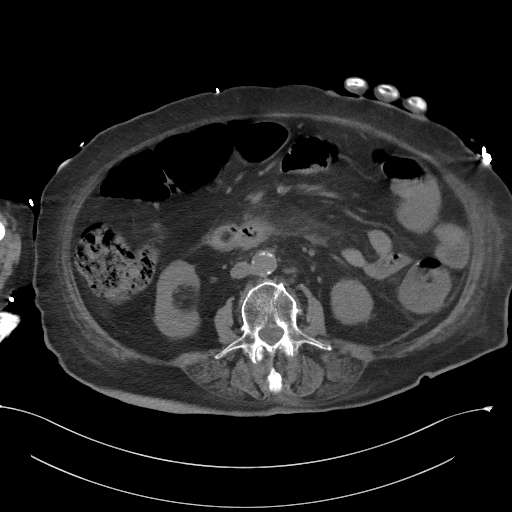
[im 62/104  soft-tissue]
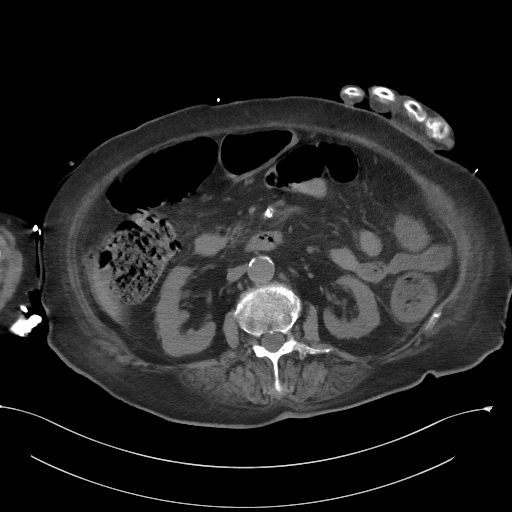
[im 73/104  soft-tissue]
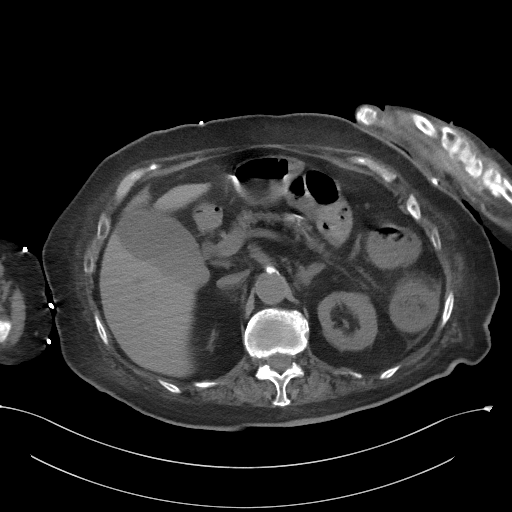
[im 73/104  bone]
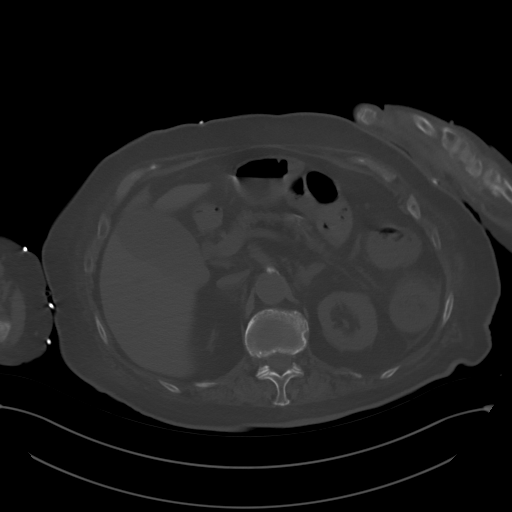
[im 83/104  soft-tissue]
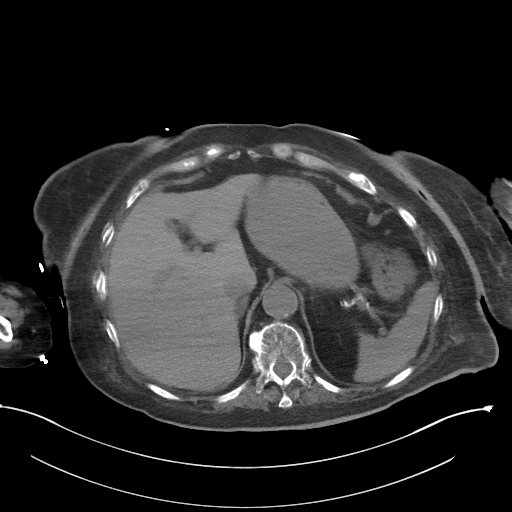
[im 88/104  soft-tissue]
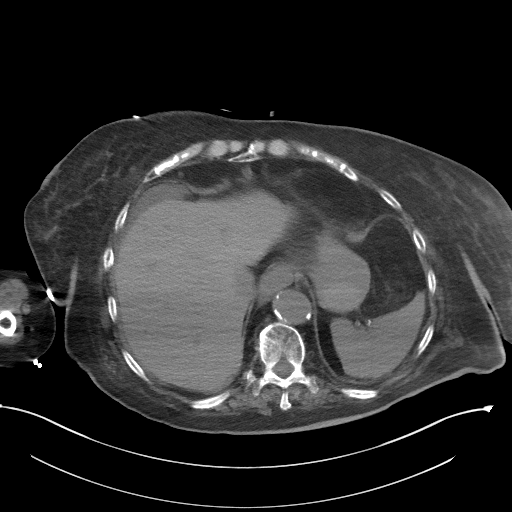
[im 98/104  soft-tissue]
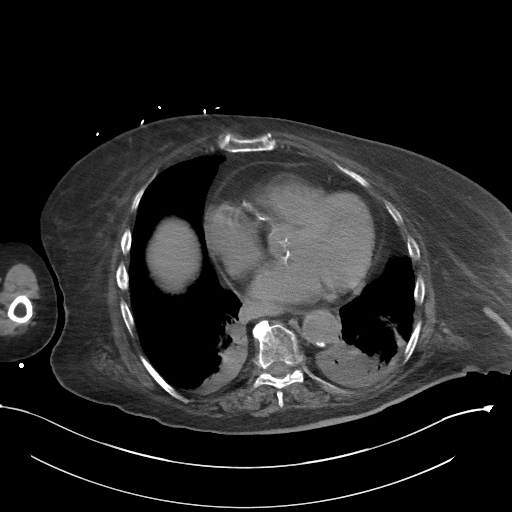

[Series 4: coronal st · coronal · 1.09mm/px · 3 of 98 slices shown]
[im 33/98  soft-tissue]
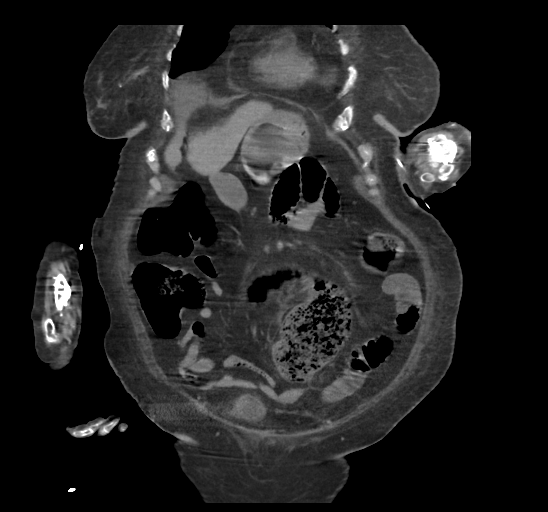
[im 44/98  soft-tissue]
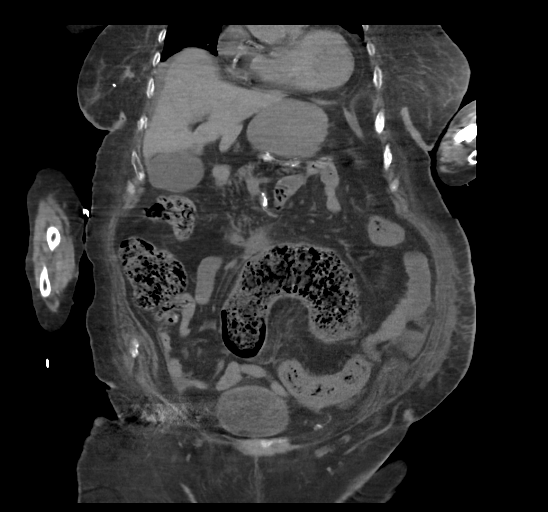
[im 54/98  soft-tissue]
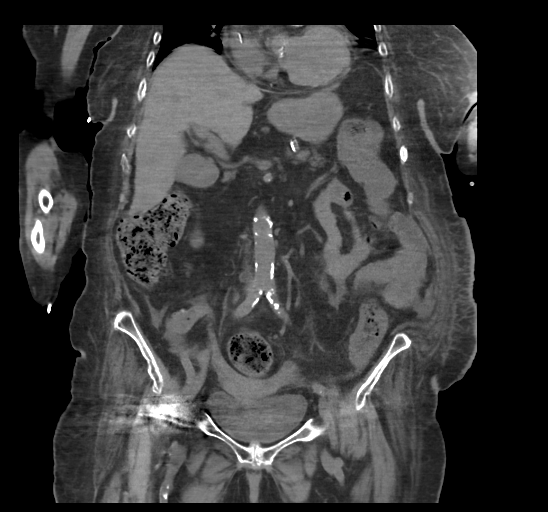

[15 of 46 positions shown; findings below may reference images not displayed]

FINDINGS: Examination is limited by motion artifact and by streak artifact
from the upper extremities.

Lower chest: Dense consolidation with associated volume loss in the
dependent lower lobes and lingula, favor atelectasis. Three-vessel
coronary atherosclerosis. Tip of superior approach central venous
catheter is seen at the cavoatrial junction. Healed deformities are
again noted and lower right ribs.

Hepatobiliary: Normal liver size. No gross liver mass. Mildly
distended gallbladder. No gallbladder wall thickening. Subcentimeter
minimally hyperdense focus in the dependent gallbladder may
represent sludge or tiny gallstones. Dilated common bile duct (10 mm
diameter), mildly increased from 7 mm on 04/27/2017. No evidence of
obstructing biliary mass or radiopaque choledocholithiasis.

Pancreas: Normal, with no mass or duct dilation.

Spleen: Normal size. No mass.

Adrenals/Urinary Tract: Stable 1.9 cm right adrenal and 2.7 cm left
adrenal adenomas. Punctate nonobstructing stone versus vascular
calcification in the posterior interpolar right renal sinus. No
additional potential renal stones. No hydronephrosis. No contour
deforming renal masses. Limited bladder visualization due to streak
artifact, with no gross bladder abnormality.

Stomach/Bowel: Grossly normal stomach. Normal caliber small bowel
with no small bowel wall thickening. Normal appendix. There is a
moderate to large amount of stool throughout the large bowel and
rectum. There is mild wall thickening in the splenic flexure of the
colon, descending colon, sigmoid colon and rectum with associated
pericolonic fat stranding and ill-defined fluid. No evidence of
pneumatosis. No significant colonic diverticulosis.

Vascular/Lymphatic: Atherosclerotic nonaneurysmal abdominal aorta.
No pathologically enlarged lymph nodes in the abdomen or pelvis.

Reproductive: Grossly normal uterus.  No adnexal mass.

Other: Trace left paracolic gutter ascites. No focal fluid
collection. No pneumoperitoneum. Mild anasarca.

Musculoskeletal: No aggressive appearing focal osseous lesions.
Partially visualized right total hip arthroplasty. Partially
visualized surgical hardware in the bilateral wrists. Chronic mild
L1 vertebral compression deformity is stable. Moderate thoracolumbar
spondylosis.
IMPRESSION: 1. Mild wall thickening in the splenic flexure, descending and
sigmoid colon and rectum with associated pericolonic fat stranding,
suggestive of a nonspecific infectious or inflammatory colitis, with
the differential including C. diff colitis. No pneumatosis, free air
or abscess.
2. Moderate to large colorectal stool volume.
3. Trace left paracolic gutter ascites.
4. Dense consolidation with associated volume loss in the dependent
lung bases, favor atelectasis .
5. Mildly distended gallbladder with minimal sludge versus tiny
gallstones. No gallbladder wall thickening.
6. Dilated common bile duct (10 mm diameter), mildly increased. No
evidence of obstructing biliary mass or radiopaque
choledocholithiasis. Recommend correlation with serum bilirubin
levels.
7. Stable bilateral adrenal adenomas.
8.  Aortic Atherosclerosis ([id number]).
